# Patient Record
Sex: Male | Born: 1985
Health system: Southern US, Community
[De-identification: ages and names within clinical notes are randomized; demographics above are authoritative.]

## PROBLEM LIST (undated history)

## (undated) DIAGNOSIS — I471 Supraventricular tachycardia, unspecified: Secondary | ICD-10-CM

## (undated) DIAGNOSIS — N179 Acute kidney failure, unspecified: Secondary | ICD-10-CM

## (undated) DIAGNOSIS — E109 Type 1 diabetes mellitus without complications: Secondary | ICD-10-CM

## (undated) DIAGNOSIS — I639 Cerebral infarction, unspecified: Secondary | ICD-10-CM

## (undated) DIAGNOSIS — I10 Essential (primary) hypertension: Secondary | ICD-10-CM

## (undated) HISTORY — DX: Acute kidney failure, unspecified: N17.9

## (undated) HISTORY — DX: Cerebral infarction, unspecified: I63.9

## (undated) HISTORY — DX: Supraventricular tachycardia: I47.1

## (undated) HISTORY — DX: Essential (primary) hypertension: I10

## (undated) HISTORY — DX: Supraventricular tachycardia, unspecified: I47.10

---

## 2019-06-12 ENCOUNTER — Encounter (HOSPITAL_COMMUNITY): Payer: Self-pay

## 2019-06-12 ENCOUNTER — Inpatient Hospital Stay (HOSPITAL_COMMUNITY)
Admission: EM | Admit: 2019-06-12 | Discharge: 2019-06-20 | DRG: 064 | Disposition: A | Discharge status: Home / Self Care | Payer: Medicaid Other | Attending: Internal Medicine | Admitting: Internal Medicine

## 2019-06-12 ENCOUNTER — Emergency Department (HOSPITAL_COMMUNITY): Payer: Medicaid Other

## 2019-06-12 ENCOUNTER — Other Ambulatory Visit: Payer: Self-pay

## 2019-06-12 DIAGNOSIS — E785 Hyperlipidemia, unspecified: Secondary | ICD-10-CM | POA: Diagnosis present

## 2019-06-12 DIAGNOSIS — J9690 Respiratory failure, unspecified, unspecified whether with hypoxia or hypercapnia: Secondary | ICD-10-CM

## 2019-06-12 DIAGNOSIS — I160 Hypertensive urgency: Secondary | ICD-10-CM | POA: Diagnosis present

## 2019-06-12 DIAGNOSIS — E1010 Type 1 diabetes mellitus with ketoacidosis without coma: Secondary | ICD-10-CM | POA: Diagnosis not present

## 2019-06-12 DIAGNOSIS — D72829 Elevated white blood cell count, unspecified: Secondary | ICD-10-CM

## 2019-06-12 DIAGNOSIS — Z781 Physical restraint status: Secondary | ICD-10-CM

## 2019-06-12 DIAGNOSIS — I63432 Cerebral infarction due to embolism of left posterior cerebral artery: Principal | ICD-10-CM | POA: Diagnosis present

## 2019-06-12 DIAGNOSIS — E1049 Type 1 diabetes mellitus with other diabetic neurological complication: Secondary | ICD-10-CM | POA: Diagnosis not present

## 2019-06-12 DIAGNOSIS — N183 Chronic kidney disease, stage 3 unspecified: Secondary | ICD-10-CM

## 2019-06-12 DIAGNOSIS — R29713 NIHSS score 13: Secondary | ICD-10-CM | POA: Diagnosis present

## 2019-06-12 DIAGNOSIS — R402253 Coma scale, best verbal response, oriented, at hospital admission: Secondary | ICD-10-CM | POA: Diagnosis present

## 2019-06-12 DIAGNOSIS — G9341 Metabolic encephalopathy: Secondary | ICD-10-CM | POA: Diagnosis not present

## 2019-06-12 DIAGNOSIS — E1022 Type 1 diabetes mellitus with diabetic chronic kidney disease: Secondary | ICD-10-CM | POA: Diagnosis present

## 2019-06-12 DIAGNOSIS — I129 Hypertensive chronic kidney disease with stage 1 through stage 4 chronic kidney disease, or unspecified chronic kidney disease: Secondary | ICD-10-CM | POA: Diagnosis present

## 2019-06-12 DIAGNOSIS — R402143 Coma scale, eyes open, spontaneous, at hospital admission: Secondary | ICD-10-CM | POA: Diagnosis present

## 2019-06-12 DIAGNOSIS — R402363 Coma scale, best motor response, obeys commands, at hospital admission: Secondary | ICD-10-CM | POA: Diagnosis present

## 2019-06-12 DIAGNOSIS — I248 Other forms of acute ischemic heart disease: Secondary | ICD-10-CM | POA: Diagnosis present

## 2019-06-12 DIAGNOSIS — Z8673 Personal history of transient ischemic attack (TIA), and cerebral infarction without residual deficits: Secondary | ICD-10-CM | POA: Diagnosis present

## 2019-06-12 DIAGNOSIS — I471 Supraventricular tachycardia, unspecified: Secondary | ICD-10-CM

## 2019-06-12 DIAGNOSIS — R7982 Elevated C-reactive protein (CRP): Secondary | ICD-10-CM | POA: Diagnosis present

## 2019-06-12 DIAGNOSIS — Z794 Long term (current) use of insulin: Secondary | ICD-10-CM

## 2019-06-12 DIAGNOSIS — I639 Cerebral infarction, unspecified: Secondary | ICD-10-CM | POA: Diagnosis not present

## 2019-06-12 DIAGNOSIS — E10649 Type 1 diabetes mellitus with hypoglycemia without coma: Secondary | ICD-10-CM | POA: Diagnosis not present

## 2019-06-12 DIAGNOSIS — I10 Essential (primary) hypertension: Secondary | ICD-10-CM

## 2019-06-12 DIAGNOSIS — G9340 Encephalopathy, unspecified: Secondary | ICD-10-CM | POA: Diagnosis present

## 2019-06-12 DIAGNOSIS — Z1159 Encounter for screening for other viral diseases: Secondary | ICD-10-CM

## 2019-06-12 DIAGNOSIS — E109 Type 1 diabetes mellitus without complications: Secondary | ICD-10-CM

## 2019-06-12 DIAGNOSIS — N179 Acute kidney failure, unspecified: Secondary | ICD-10-CM

## 2019-06-12 DIAGNOSIS — R451 Restlessness and agitation: Secondary | ICD-10-CM

## 2019-06-12 DIAGNOSIS — R29818 Other symptoms and signs involving the nervous system: Secondary | ICD-10-CM | POA: Diagnosis present

## 2019-06-12 DIAGNOSIS — R4701 Aphasia: Secondary | ICD-10-CM | POA: Diagnosis present

## 2019-06-12 DIAGNOSIS — Z20828 Contact with and (suspected) exposure to other viral communicable diseases: Secondary | ICD-10-CM | POA: Diagnosis not present

## 2019-06-12 DIAGNOSIS — F1210 Cannabis abuse, uncomplicated: Secondary | ICD-10-CM | POA: Diagnosis present

## 2019-06-12 HISTORY — DX: Type 1 diabetes mellitus without complications: E10.9

## 2019-06-12 LAB — POCT I-STAT EG7
Acid-Base Excess: 3 mmol/L — ABNORMAL HIGH (ref 0.0–2.0)
Bicarbonate: 26.4 mmol/L (ref 20.0–28.0)
Calcium, Ion: 1.22 mmol/L (ref 1.15–1.40)
HCT: 51 % (ref 39.0–52.0)
Hemoglobin: 17.3 g/dL — ABNORMAL HIGH (ref 13.0–17.0)
O2 Saturation: 90 %
Potassium: 3.2 mmol/L — ABNORMAL LOW (ref 3.5–5.1)
Sodium: 135 mmol/L (ref 135–145)
TCO2: 27 mmol/L (ref 22–32)
pCO2, Ven: 35.6 mmHg — ABNORMAL LOW (ref 44.0–60.0)
pH, Ven: 7.477 — ABNORMAL HIGH (ref 7.250–7.430)
pO2, Ven: 54 mmHg — ABNORMAL HIGH (ref 32.0–45.0)

## 2019-06-12 LAB — CBG MONITORING, ED: Glucose-Capillary: 118 mg/dL — ABNORMAL HIGH (ref 70–99)

## 2019-06-12 LAB — CBC WITH DIFFERENTIAL/PLATELET
Abs Immature Granulocytes: 0.1 10*3/uL — ABNORMAL HIGH (ref 0.00–0.07)
Basophils Absolute: 0.1 10*3/uL (ref 0.0–0.1)
Basophils Relative: 0 %
Eosinophils Absolute: 0 10*3/uL (ref 0.0–0.5)
Eosinophils Relative: 0 %
HCT: 48.2 % (ref 39.0–52.0)
Hemoglobin: 17.1 g/dL — ABNORMAL HIGH (ref 13.0–17.0)
Immature Granulocytes: 1 %
Lymphocytes Relative: 7 %
Lymphs Abs: 1.6 10*3/uL (ref 0.7–4.0)
MCH: 30.5 pg (ref 26.0–34.0)
MCHC: 35.5 g/dL (ref 30.0–36.0)
MCV: 85.9 fL (ref 80.0–100.0)
Monocytes Absolute: 1 10*3/uL (ref 0.1–1.0)
Monocytes Relative: 5 %
Neutro Abs: 19.1 10*3/uL — ABNORMAL HIGH (ref 1.7–7.7)
Neutrophils Relative %: 87 %
Platelets: 335 10*3/uL (ref 150–400)
RBC: 5.61 MIL/uL (ref 4.22–5.81)
RDW: 12.4 % (ref 11.5–15.5)
WBC: 21.9 10*3/uL — ABNORMAL HIGH (ref 4.0–10.5)
nRBC: 0 % (ref 0.0–0.2)

## 2019-06-12 LAB — PROTIME-INR
INR: 0.9 (ref 0.8–1.2)
Prothrombin Time: 12.1 seconds (ref 11.4–15.2)

## 2019-06-12 LAB — LACTIC ACID, PLASMA: Lactic Acid, Venous: 2.5 mmol/L (ref 0.5–1.9)

## 2019-06-12 MED ORDER — SODIUM CHLORIDE 0.9 % IV BOLUS
1000.0000 mL | Freq: Once | INTRAVENOUS | Status: AC
  Administered 2019-06-13: 1000 mL via INTRAVENOUS

## 2019-06-12 MED ORDER — CLEVIDIPINE BUTYRATE 0.5 MG/ML IV EMUL
0.0000 mg/h | INTRAVENOUS | Status: DC
  Administered 2019-06-12: 20 mg/h via INTRAVENOUS
  Administered 2019-06-12: 2 mg/h via INTRAVENOUS
  Filled 2019-06-12 (×2): qty 50

## 2019-06-12 MED ORDER — SODIUM CHLORIDE 0.9 % IV BOLUS (SEPSIS)
1000.0000 mL | Freq: Once | INTRAVENOUS | Status: AC
  Administered 2019-06-12: 1000 mL via INTRAVENOUS

## 2019-06-12 MED ORDER — LORAZEPAM 2 MG/ML IJ SOLN
1.0000 mg | INTRAMUSCULAR | Status: DC | PRN
  Filled 2019-06-12: qty 1

## 2019-06-12 MED ORDER — SODIUM CHLORIDE 0.9 % IV SOLN
1000.0000 mL | INTRAVENOUS | Status: DC
  Administered 2019-06-12 – 2019-06-13 (×3): 1000 mL via INTRAVENOUS

## 2019-06-12 MED ORDER — LORAZEPAM 2 MG/ML IJ SOLN
2.0000 mg | Freq: Once | INTRAMUSCULAR | Status: AC
  Administered 2019-06-12: 2 mg via INTRAVENOUS

## 2019-06-12 MED ORDER — SODIUM CHLORIDE 0.9 % IV SOLN
Freq: Once | INTRAVENOUS | Status: AC
  Administered 2019-06-12: 23:00:00 via INTRAVENOUS

## 2019-06-12 MED ORDER — FENTANYL CITRATE (PF) 100 MCG/2ML IJ SOLN
12.5000 ug | Freq: Once | INTRAMUSCULAR | Status: AC
  Administered 2019-06-12: 12.5 ug via INTRAVENOUS

## 2019-06-12 MED ORDER — LORAZEPAM 2 MG/ML IJ SOLN
INTRAMUSCULAR | Status: AC
  Filled 2019-06-12: qty 1

## 2019-06-12 MED ORDER — FENTANYL CITRATE (PF) 100 MCG/2ML IJ SOLN
INTRAMUSCULAR | Status: AC
  Filled 2019-06-12: qty 2

## 2019-06-12 MED ORDER — LORAZEPAM 2 MG/ML IJ SOLN
1.0000 mg | Freq: Once | INTRAMUSCULAR | Status: AC
  Administered 2019-06-12: 1 mg via INTRAVENOUS
  Filled 2019-06-12: qty 1

## 2019-06-12 NOTE — ED Notes (Signed)
CCM at bedside 

## 2019-06-12 NOTE — ED Provider Notes (Signed)
[facility] EMERGENCY DEPARTMENT Provider Note   CSN: [id number] Arrival date & time: 06/12/19  2119    History   Chief Complaint Chief Complaint  Patient presents with   Aphasia    HPI George [name] is a 33 y.o. male.     HPI Reportedly patient has not been seeing a physician for management of his type 1 diabetes.  He apparently got tired of doing that and so for several years has been living with his parents.  He has been getting NovoLog from the pharmacy without a prescription and managing his diabetes based on self check of blood sugars.  Reportedly the patient was at baseline on Friday, he ate dinner with family members and his mother reports she last saw him at 8:30 PM.  He went to his room and she did not see him again until Saturday morning..  On Saturday morning he complained of a severe splitting headache.  That was at approximately 9 AM.  At that time she could tell that he was not feeling good.  She reports he did seem to want to use the wall to guide him as he went down the hallway.  Reportedly he was somewhat agitated and not quite himself but not abnormal enough to indicate he needed evaluation.  Patient's mother noted that when he did his blood sugar check in the evening he seemed to have difficulty getting it on the strip and she had to help him which is not typical.  He could not use the right words to name the parts of his glucometer.  At noon today he had a dramatic change in his speech patterns.  He started having nonsense and repetitive speech.  At that point EMS was contacted.  When he arrived to the emergency department, patient is denying he has any headache or pain.  He is alert but speech is repetitive and tangential without be able to answer questions in normal cognitive fashion.  Per the patient's mother there is no known significant history of drug abuse.  No known history previously of hypertension. Past Medical History:  Diagnosis Date    Type 1 diabetes ([place])     There are no active problems to display for this patient.   History reviewed. No pertinent surgical history.      Home Medications    Prior to Admission medications   Not on File    Family History History reviewed. No pertinent family history.  Social History Social History   Tobacco Use   Smoking status: Never Smoker   Smokeless tobacco: Never Used  Substance Use Topics   Alcohol use: Yes    Comment: 6 beers/week   Drug use: Not Currently     Allergies   Patient has no known allergies.   Review of Systems Review of Systems Level 5 caveat cannot obtain review of systems due to patient condition.  Physical Exam Updated Vital Signs BP (!) 146/101    Pulse (!) 147    Temp 98.6 F (37 C) (Oral)    Resp 20    Ht 5\' 5"  (1.651 m)    Wt 56.7 kg    SpO2 99%    BMI 20.80 kg/m   Physical Exam Constitutional:      Comments: Patient is alert.  He is sitting up in the stretcher.  He does not appear somnolent.  He is very talkative but speech is not making much sense.  Words individually are clear.  No respiratory distress.  Patient does not smell of ketones.  HENT:     Head: Normocephalic and atraumatic.     Nose: Nose normal.     Mouth/Throat:     Mouth: Mucous membranes are moist.     Pharynx: Oropharynx is clear.  Eyes:     Extraocular Movements: Extraocular movements intact.     Pupils: Pupils are equal, round, and reactive to light.     Comments: Patient can follow my fingers past midline on both sides.  He will look directly at me while speaking.  He will follow me from 1 side of the stretcher to the other with his gaze.  Neck:     Musculoskeletal: Neck supple.  Cardiovascular:     Comments: Borderline tachycardia.  No gross rub murmur gallop. Pulmonary:     Effort: Pulmonary effort is normal.     Breath sounds: Normal breath sounds.  Abdominal:     General: There is no distension.     Palpations: Abdomen is soft.      Tenderness: There is no abdominal tenderness. There is no guarding.  Musculoskeletal: Normal range of motion.        General: No swelling or tenderness.     Right lower leg: No edema.     Left lower leg: No edema.  Skin:    General: Skin is warm and dry.  Neurological:     Comments: Ental status is alert and wide-awake.  Patient cannot appropriately follow commands for finger-nose exam.  He will first reach out for my hand and continue to reach forward and try to grasp my finger.  He does not understand the command to touch his own nose.  Can do this on both sides.  His use of the upper extremities is symmetric.  He has intact motor strength.  He is having difficulty understanding the commands.  He will either keep his arms very flexed and asked to extend or move them out when asked to do other activities.  He exhibits the effort in the will to follow commands but is somewhat random in his ability to execute.  5\5 motor strength of both lower extremities.  Patient cannot name objects.  He gives some tangential descriptions.  He recognized my work phone and states "that is an old one" he cannot generate the word phone but goes on to describe that it is for persons to persons.  He also cannot name my pen.  Psychiatric:     Comments: Mood seems good.  Patient is cheerful      ED Treatments / Results  Labs (all labs ordered are listed, but only abnormal results are displayed) Labs Reviewed  COMPREHENSIVE METABOLIC PANEL - Abnormal; Notable for the following components:      Result Value   Sodium 134 (*)    Potassium 3.2 (*)    Glucose, Bld 132 (*)    Creatinine, Ser 2.23 (*)    Alkaline Phosphatase 128 (*)    GFR calc non Af Amer 38 (*)    GFR calc Af Amer 44 (*)    All other components within normal limits  LACTIC ACID, PLASMA - Abnormal; Notable for the following components:   Lactic Acid, Venous 2.5 (*)    All other components within normal limits  CBC WITH DIFFERENTIAL/PLATELET -  Abnormal; Notable for the following components:   WBC 21.9 (*)    Hemoglobin 17.1 (*)    Neutro Abs 19.1 (*)    Abs Immature Granulocytes 0.10 (*)  All other components within normal limits  PHOSPHORUS - Abnormal; Notable for the following components:   Phosphorus 2.4 (*)    All other components within normal limits  CBG MONITORING, ED - Abnormal; Notable for the following components:   Glucose-Capillary 118 (*)    All other components within normal limits  POCT I-STAT EG7 - Abnormal; Notable for the following components:   pH, Ven 7.477 (*)    pCO2, Ven 35.6 (*)    pO2, Ven 54.0 (*)    Acid-Base Excess 3.0 (*)    Potassium 3.2 (*)    Hemoglobin 17.3 (*)    All other components within normal limits  NOVEL CORONAVIRUS, NAA (HOSPITAL ORDER, SEND-OUT TO REF LAB)  PROTIME-INR  MAGNESIUM  ACETAMINOPHEN LEVEL  ETHANOL  LACTIC ACID, PLASMA  URINALYSIS, ROUTINE W REFLEX MICROSCOPIC  RAPID URINE DRUG SCREEN, HOSP PERFORMED  BLOOD GAS, VENOUS  TSH    EKG EKG Interpretation  Date/Time:  Sunday June 12 2019 23:30:31 EDT Ventricular Rate:  151 PR Interval:    QRS Duration: 98 QT Interval:  356 QTC Calculation: 565 R Axis:   -174 Text Interpretation:  Sinus tachycardia Right axis deviation agree. no old comp Confirmed by Charlesetta Shanks [id number]) on 06/13/2019 12:02:10 AM   Radiology Ct Head Wo Contrast  Result Date: 06/12/2019 CLINICAL DATA:  33 y/o M; altered level of consciousness. Expressive aphasia, type 1 diabetes. EXAM: CT HEAD WITHOUT CONTRAST TECHNIQUE: Contiguous axial images were obtained from the base of the skull through the vertex without intravenous contrast. COMPARISON:  None. FINDINGS: Brain: Hypoattenuation with loss of gray-white differentiation involving left occipital lobe and left medial temporal lobe. No hemorrhage, extra-axial collection, hydrocephalus, or herniation. Vascular: No hyperdense vessel or unexpected calcification. Skull: Normal. Negative for  fracture or focal lesion. Sinuses/Orbits: No acute finding. Other: None. IMPRESSION: Large area of hypoattenuation in the left PCA distribution probably representing a late acute to early subacute infarction. No hemorrhage or gross mass effect. Differential of underlying neoplasm or cerebritis is less likely given typical vascular distribution. These results were called by telephone at the time of interpretation on 06/12/2019 at 10:08 pm to Dr. Charlesetta Shanks , who verbally acknowledged these results. Electronically Signed   By: Kristine Garbe M.D.   On: 06/12/2019 22:13    Procedures Procedures (including critical care time) CRITICAL CARE Performed by: Charlesetta Shanks   Total critical care time: 45  minutes  Critical care time was exclusive of separately billable procedures and treating other patients.  Critical care was necessary to treat or prevent imminent or life-threatening deterioration.  Critical care was time spent personally by me on the following activities: development of treatment plan with patient and/or surrogate as well as nursing, discussions with consultants, evaluation of patient's response to treatment, examination of patient, obtaining history from patient or surrogate, ordering and performing treatments and interventions, ordering and review of laboratory studies, ordering and review of radiographic studies, pulse oximetry and re-evaluation of patient's condition. Medications Ordered in ED Medications  clevidipine (CLEVIPREX) infusion 0.5 mg/mL (18 mg/hr Intravenous Rate/Dose Change 06/12/19 2359)  sodium chloride 0.9 % bolus 1,000 mL (1,000 mLs Intravenous New Bag/Given 06/12/19 2347)    Followed by  0.9 %  sodium chloride infusion (1,000 mLs Intravenous New Bag/Given 06/12/19 2347)  LORazepam (ATIVAN) injection 1 mg (has no administration in time range)  sodium chloride 0.9 % bolus 1,000 mL (has no administration in time range)  LORazepam (ATIVAN) injection 1 mg  (1 mg Intravenous Given 06/12/19  2233)  fentaNYL (SUBLIMAZE) injection 12.5 mcg (12.5 mcg Intravenous Given 06/12/19 2237)  0.9 %  sodium chloride infusion ( Intravenous Stopped 06/12/19 2349)  LORazepam (ATIVAN) injection 2 mg (2 mg Intravenous Given 06/12/19 2337)     Initial Impression / Assessment and Plan / ED Course  I have reviewed the triage vital signs and the nursing notes.  Pertinent labs & imaging results that were available during my care of the patient were reviewed by me and considered in my medical decision making (see chart for details).  Clinical Course as of Jun 12 2  Sun Jun 12, 2019  2206 Consult: Dr. Cheral Marker has been consulted after first evaluation of the patient.  He will come to emergency department for evaluation.  Since then CT scan has not resulted but I have reviewed and there is a large shadowing mass on the left.   [MP]  2255 Consult :reviewed with [facility]ink for admission for cleviprex drip.   [MP]    Clinical Course User Index [MP] Charlesetta Shanks, MD       Patient presents with significant speech anomaly.  Last known well was 8:30 PM Friday (2 days ago).  CT shows large infarct territory.  Patient does have comorbid illness of type 1 diabetes.  He has been managing that at home without specific medical oversight.  Neurology consulted.  Cleviprex initiated for blood pressure control.  Patient is requiring Ativan for sedation.  He has been somewhat agitated.  He is very alert.  No airway compromise.  Patient will be admitted to intensivist service for management of blood pressure with consultation to neurology.  Final Clinical Impressions(s) / ED Diagnoses   Final diagnoses:  Cerebral infarction, unspecified mechanism ([place])  Type 1 diabetes mellitus with other neurologic complication [facility])    ED Discharge Orders    None       Charlesetta Shanks, MD 06/13/19 0006

## 2019-06-12 NOTE — H&P (Signed)
PULMONARY / CRITICAL CARE MEDICINE   Name: George [name] MRN: [id number] DOB: 1986/09/02    ADMISSION DATE:  06/12/2019 CONSULTATION DATE:  06/12/2019  REFERRING MD:  Charlesetta Shanks, MD  CHIEF COMPLAINT:  headache  HISTORY OF PRESENT ILLNESS:   33 year old male history of type 1 diabetes who presented to the emergency department with complaints of speech difficulty, "holding on to the wall" at home and headache. Last known normal was around 8 pm Friday night, then awoke Saturday morning with above symptoms.    He was seen by neurology who noted receptive aphasia, not oriented, difficulty comprehending commands.  She was out of the window for TPA and was admitted to critical care medicine for intensive blood pressure control and stroke monitoring.  When I arrived he was initially calm, able to cooperate fully with neurological exam as below, however he shortly thereafter became very agitated. He had already received multiple doses of ativan and he was subsequently given haldol 5 followed by 10 mg before becoming calm.   I spoke with his mother who stated he complained of minor abdominal pain, but otherwise no fevers, chills, sore throat, myalgias, nausea, vomiting, diarrhea, shortness of breath.  She states that he has a chronic "smoker's cough" that is unchanged from his baseline.   PAST MEDICAL HISTORY :  He  has a past medical history of Type 1 diabetes ([place]).  PAST SURGICAL HISTORY: He  has no past surgical history on file.  No Known Allergies  No current facility-administered medications on file prior to encounter.    No current outpatient medications on file prior to encounter.    FAMILY HISTORY:  His has no family status information on file.    SOCIAL HISTORY: He  reports that he has never smoked. He has never used smokeless tobacco. He reports current alcohol use. He reports previous drug use.  REVIEW OF SYSTEMS:   Unable to obtain   VITAL SIGNS: BP (!)  146/101   Pulse (!) 147   Temp 98.6 F (37 C) (Oral)   Resp 20   Ht 5\' 5"  (1.651 m)   Wt 56.7 kg   SpO2 99%   BMI 20.80 kg/m   HEMODYNAMICS:    VENTILATOR SETTINGS:    INTAKE / OUTPUT: No intake/output data recorded.  PHYSICAL EXAMINATION: Physical Exam Constitutional:      Comments: Initially calm however later became extremely agitated, requiring 10 team members to restrain  HENT:     Head: Normocephalic and atraumatic.  Eyes:     General: No scleral icterus.       Right eye: No discharge.        Left eye: No discharge.  Neck:     Vascular: No JVD.  Cardiovascular:     Rate and Rhythm: Regular rhythm. Tachycardia present.     Heart sounds: No murmur.  Pulmonary:     Effort: Pulmonary effort is normal. No respiratory distress.     Breath sounds: Normal breath sounds. No stridor.  Abdominal:     General: There is no distension.     Palpations: Abdomen is soft.     Tenderness: There is no abdominal tenderness.  Musculoskeletal:        General: No deformity.  Skin:    General: Skin is warm and dry.  Neurological:     Mental Status: He is alert.     GCS: GCS eye subscore is 4. GCS verbal subscore is 5. GCS motor subscore is 6.  Cranial Nerves: No dysarthria.     Motor: Motor function is intact.     Coordination: Heel to [facility] Test abnormal (unable to follow commands).     Comments: Speech fluent but word salad, unable to repeat object names or simple phrases. Follows some commands but not multiple step commands such as heel to shin. No oriented     LABS:  BMET Recent Labs  Lab 06/12/19 2305 06/12/19 2316  NA 134* 135  K 3.2* 3.2*  CL 98  --   CO2 23  --   BUN 14  --   CREATININE 2.23*  --   GLUCOSE 132*  --     Electrolytes Recent Labs  Lab 06/12/19 2305  CALCIUM 9.6  MG 1.9  PHOS 2.4*    CBC Recent Labs  Lab 06/12/19 2305 06/12/19 2316  WBC 21.9*  --   HGB 17.1* 17.3*  HCT 48.2 51.0  PLT 335  --     Coag's Recent Labs  Lab  06/12/19 2305  INR 0.9    Sepsis Markers Recent Labs  Lab 06/12/19 2305  LATICACIDVEN 2.5*    ABG No results for input(s): PHART, PCO2ART, PO2ART in the last 168 hours.  Liver Enzymes Recent Labs  Lab 06/12/19 2305  AST 22  ALT 16  ALKPHOS 128*  BILITOT 0.4  ALBUMIN 3.7    Cardiac Enzymes No results for input(s): TROPONINI, PROBNP in the last 168 hours.  Glucose Recent Labs  Lab 06/12/19 2344  GLUCAP 118*    Imaging Ct Head Wo Contrast  Result Date: 06/12/2019 CLINICAL DATA:  33 y/o M; altered level of consciousness. Expressive aphasia, type 1 diabetes. EXAM: CT HEAD WITHOUT CONTRAST TECHNIQUE: Contiguous axial images were obtained from the base of the skull through the vertex without intravenous contrast. COMPARISON:  None. FINDINGS: Brain: Hypoattenuation with loss of gray-white differentiation involving left occipital lobe and left medial temporal lobe. No hemorrhage, extra-axial collection, hydrocephalus, or herniation. Vascular: No hyperdense vessel or unexpected calcification. Skull: Normal. Negative for fracture or focal lesion. Sinuses/Orbits: No acute finding. Other: None. IMPRESSION: Large area of hypoattenuation in the left PCA distribution probably representing a late acute to early subacute infarction. No hemorrhage or gross mass effect. Differential of underlying neoplasm or cerebritis is less likely given typical vascular distribution. These results were called by telephone at the time of interpretation on 06/12/2019 at 10:08 pm to Dr. Charlesetta Shanks , who verbally acknowledged these results. Electronically Signed   By: Kristine Garbe M.D.   On: 06/12/2019 22:13     STUDIES:  [facility] 6/14 completed left occipital and posterior temporal lobe ischemic infarction  CULTURES: none  ANTIBIOTICS: none  SIGNIFICANT EVENTS: 6/14 p/w aphasia, confusion, agitation  LINES/TUBES: none  DISCUSSION: Family updated by ED provider 6/14  ASSESSMENT /  PLAN: 33 year old male history of type 1 diabetes who presented to the emergency department with complaints of speech difficulty, "stumbling into things" at home and headache.  Found to have a completed left occipital and posterior temporal lobe ischemic infarction.  PULMONARY A: No active issues   CARDIOVASCULAR A:  tachycardia  P:  - extremely tachycardic even when calm. Receiving IVF now.  - Check TSH, echo. No symptoms to suspect sepsis. -likely just due to severe agitation with not being able to communicate so will reassess once adequately sedated and consider start beta blocker if still tachycardic    RENAL A:   Acute vs chronic kidney injury  P:   -No baseline records  to compare to.  Can discuss with him once he is more communicative or with his mother in the morning to establish baseline.  Follow-up renal indices after receiving IV fluids in emergency department, consider renal ultrasound if not trending towards normal - start lisinopril, titrate as needed  GASTROINTESTINAL A:   Suspected dysphagia  P:   - speech/swallow as below - NPO until then  HEMATOLOGIC A:  No active issues   INFECTIOUS A:   leukocytosis  P:   - suspect reactive, however with tachycardia monitor for any possible sources of infection  ENDOCRINE A:   Diabetes mellitus   P:   - his mother does not know his insulin regimen and patient confused.  Will start on sliding scale and adjust as needed  NEUROLOGIC A:   Completed left occipital and temporal lobe stroke Extreme agitation P:   RASS goal: 0 -Has required multiple doses of Ativan and haloperidol in the emergency department.  Will start Precedex.  Presumably agitation is due to his confusion and inability to communicate with stroke however he appears even more agitated now expect that this and therefore will check a urine drug screen though his mother denies any substances of abuse per the emergency department - echo - CTA head  and neck - stroke labs - discussed with Neurology Dr Cheral Marker, as he is now almost 48 hours from first symptoms will control BP ~ 25% of max here with a goal SBP 140-160 and lower by 15% each subsequent day - speech/occupational/physical therapy - Aspirin 325 mg   FAMILY  - Updates: mother updated 6/15 AM  - Inter-disciplinary family meet or Palliative Care meeting due by:  day 7   Mali Keyondra Lagrand, MD Pulmonary and [place] Pager: [phone]  06/13/2019, 12:48 AM

## 2019-06-12 NOTE — ED Triage Notes (Signed)
PT BIB [facility] for eval of expressive aphasia onset this afternoon at 1200. EMS reports pt w/ difficulty "completing normal tasks" yesterday, w/ some agitation and HA. States onset of aphasia today. Pt w/ receptive aphasia on arrival, EMS reports only noted expressive aphasia.

## 2019-06-12 NOTE — ED Notes (Signed)
MD Pfeiffer at bedside, per MD NO CODE STROKE AT THIS TIME. Call neurology and will advise from there.

## 2019-06-12 NOTE — Consult Note (Signed)
Referring Physician: Dr. Johnney Killian    Chief Complaint: Aphasia  HPI: George [name] is an 33 y.o. male with a history of type I diabetes, presenting to the ED via EMS for aphasia. EMS reported patient with difficulty "completing normal tasks" yesterday in conjunction with agitation and HA. Mother states the headache was a "splitting headache". The patient was noted to have a receptive aphasia with word-salad responses to questions on arrival to the ED. Mother states he was also "stumbling into things" at home. Onset of the headache was Saturday morning. Last true normal was 8:30 PM on Friday. He got up at 9 AM on Saturday, apparently feeling unwell, had to touch the wall when walking to the bathroom. By about 4 PM Saturday he began manifesting language deficit, using incorrect words when trying to describe what he perceived as trouble with his glucometer, which he needed family help to use.  Today headache was improved, but he again had difficulty using his glucometer and speech deficit had worsened by the early afternoon.   LSN: Friday at 8:30 PM tPA Given: No: Out of time window  Past Medical History:  Diagnosis Date  . Type 1 diabetes ([place])     History reviewed. No pertinent surgical history.  History reviewed. No pertinent family history. Social History:  reports that he has never smoked. He has never used smokeless tobacco. He reports current alcohol use. He reports previous drug use.  Allergies: No Known Allergies  Home Medications:  Novolog  ROS: Unable to obtain due to aphasia.   Physical Examination: Height 5\' 5"  (1.651 m), weight 56.7 kg, SpO2 100 %.  HEENT: [place]/AT Lungs: Respirations unlabored. No gross wheezing or rhonchi noted.  Ext: No edema  Neurologic Examination: Mental Status: Awake and alert. Dense receptive aphasia with fluent word-salad responses to all questions and severely impaired language comprehension. No dysarthria. Unable to answer orientation questions  or questions regarding his symptoms or history. Does not appear to be in any pain. Will follow some pantomimed commands with frequent errors and difficulty.  Cranial Nerves: II:  Tracks examiner and fixates normally. Does not consistently blink to threat in either the left or right visual field. PERRL.   III,IV, VI: EOMI with saccadic quality of pursuits noted. No nystagmus. No ptosis.  V,VII: No facial droop. Reacts equally briskly to cool metal applied to right and left sides of face.  VIII: hearing intact to voice IX,X: No hoarseness or hypophonia XI: Head at midline XII: Tongue is midline.   Motor: Right : Upper extremity   5/5    Left:     Upper extremity   5/5  Lower extremity   5/5     Lower extremity   5/5 No pronator drift.  Sensory:  Reacts equally briskly to cool metal applied to right and left sides  Deep Tendon Reflexes:  2+ bilateral upper and lower extremities. Cerebellar: No ataxia with movement on motor exam. Cannot comprehend commands to perform FNF or H-S.  Gait: Deferred   CT head:  Large area of hypoattenuation in the left PCA distribution probably representing a late acute to early subacute infarction. No hemorrhage or gross mass effect. Differential of underlying neoplasm or cerebritis is less likely given typical vascular distribution.  Assessment: 33 y.o. male presenting with receptive aphasia 1. Exam best localizes to the left perisylvian cortices.  2. CT head reveals a completed left occipital and posterior temporal lobe ischemic infarction 3. Stroke Risk Factors - Diabetes (DM type 1) 4. Out  of tPA time window  Plan: 1. HgbA1c, fasting lipid panel 2. MRI of the brain without contrast 3. PT consult, OT consult, Speech consult 4. Echocardiogram 5. CTA of head and neck to evaluate for possible dissection or vasculitis 6. Prophylactic therapy- start ASA 325 mg po qd 7. Risk factor modification 8. Telemetry monitoring 9. Frequent neuro checks 10. BP  control with clevidipine gtt 11. Management of DM1 per primary team   @Electronically  signed: Dr. Kerney Elbe  06/12/2019, 9:45 PM

## 2019-06-12 NOTE — ED Notes (Signed)
MD Pfeiffer at bedside ° °

## 2019-06-12 NOTE — ED Notes (Signed)
MD aware of bp 198/118 on cleviprex

## 2019-06-12 NOTE — ED Notes (Signed)
Pt restless, pulling at monitoring wires. Remains tachycardic, rate 150-170

## 2019-06-13 ENCOUNTER — Inpatient Hospital Stay (HOSPITAL_COMMUNITY): Payer: Medicaid Other

## 2019-06-13 DIAGNOSIS — I63532 Cerebral infarction due to unspecified occlusion or stenosis of left posterior cerebral artery: Secondary | ICD-10-CM | POA: Diagnosis not present

## 2019-06-13 DIAGNOSIS — E785 Hyperlipidemia, unspecified: Secondary | ICD-10-CM | POA: Diagnosis present

## 2019-06-13 DIAGNOSIS — N179 Acute kidney failure, unspecified: Secondary | ICD-10-CM

## 2019-06-13 DIAGNOSIS — I129 Hypertensive chronic kidney disease with stage 1 through stage 4 chronic kidney disease, or unspecified chronic kidney disease: Secondary | ICD-10-CM | POA: Diagnosis present

## 2019-06-13 DIAGNOSIS — I081 Rheumatic disorders of both mitral and tricuspid valves: Secondary | ICD-10-CM | POA: Diagnosis not present

## 2019-06-13 DIAGNOSIS — R4701 Aphasia: Secondary | ICD-10-CM | POA: Diagnosis not present

## 2019-06-13 DIAGNOSIS — I471 Supraventricular tachycardia: Secondary | ICD-10-CM | POA: Diagnosis not present

## 2019-06-13 DIAGNOSIS — E1049 Type 1 diabetes mellitus with other diabetic neurological complication: Secondary | ICD-10-CM

## 2019-06-13 DIAGNOSIS — I639 Cerebral infarction, unspecified: Secondary | ICD-10-CM | POA: Diagnosis not present

## 2019-06-13 DIAGNOSIS — R451 Restlessness and agitation: Secondary | ICD-10-CM

## 2019-06-13 DIAGNOSIS — I10 Essential (primary) hypertension: Secondary | ICD-10-CM | POA: Diagnosis not present

## 2019-06-13 DIAGNOSIS — D72829 Elevated white blood cell count, unspecified: Secondary | ICD-10-CM | POA: Diagnosis not present

## 2019-06-13 DIAGNOSIS — R29818 Other symptoms and signs involving the nervous system: Secondary | ICD-10-CM

## 2019-06-13 DIAGNOSIS — N183 Chronic kidney disease, stage 3 unspecified: Secondary | ICD-10-CM

## 2019-06-13 DIAGNOSIS — F1210 Cannabis abuse, uncomplicated: Secondary | ICD-10-CM | POA: Diagnosis present

## 2019-06-13 DIAGNOSIS — I160 Hypertensive urgency: Secondary | ICD-10-CM | POA: Diagnosis present

## 2019-06-13 DIAGNOSIS — Z794 Long term (current) use of insulin: Secondary | ICD-10-CM | POA: Diagnosis not present

## 2019-06-13 DIAGNOSIS — E109 Type 1 diabetes mellitus without complications: Secondary | ICD-10-CM | POA: Diagnosis not present

## 2019-06-13 DIAGNOSIS — Z1159 Encounter for screening for other viral diseases: Secondary | ICD-10-CM | POA: Diagnosis not present

## 2019-06-13 DIAGNOSIS — G9340 Encephalopathy, unspecified: Secondary | ICD-10-CM | POA: Diagnosis not present

## 2019-06-13 DIAGNOSIS — E10649 Type 1 diabetes mellitus with hypoglycemia without coma: Secondary | ICD-10-CM | POA: Diagnosis not present

## 2019-06-13 DIAGNOSIS — I63432 Cerebral infarction due to embolism of left posterior cerebral artery: Secondary | ICD-10-CM | POA: Diagnosis not present

## 2019-06-13 DIAGNOSIS — R402363 Coma scale, best motor response, obeys commands, at hospital admission: Secondary | ICD-10-CM | POA: Diagnosis present

## 2019-06-13 DIAGNOSIS — E1022 Type 1 diabetes mellitus with diabetic chronic kidney disease: Secondary | ICD-10-CM | POA: Diagnosis present

## 2019-06-13 DIAGNOSIS — J9600 Acute respiratory failure, unspecified whether with hypoxia or hypercapnia: Secondary | ICD-10-CM | POA: Diagnosis not present

## 2019-06-13 DIAGNOSIS — G9341 Metabolic encephalopathy: Secondary | ICD-10-CM | POA: Diagnosis not present

## 2019-06-13 DIAGNOSIS — I248 Other forms of acute ischemic heart disease: Secondary | ICD-10-CM | POA: Diagnosis not present

## 2019-06-13 DIAGNOSIS — Z20828 Contact with and (suspected) exposure to other viral communicable diseases: Secondary | ICD-10-CM | POA: Diagnosis not present

## 2019-06-13 DIAGNOSIS — Z8673 Personal history of transient ischemic attack (TIA), and cerebral infarction without residual deficits: Secondary | ICD-10-CM | POA: Diagnosis present

## 2019-06-13 DIAGNOSIS — R402143 Coma scale, eyes open, spontaneous, at hospital admission: Secondary | ICD-10-CM | POA: Diagnosis present

## 2019-06-13 DIAGNOSIS — R402253 Coma scale, best verbal response, oriented, at hospital admission: Secondary | ICD-10-CM | POA: Diagnosis present

## 2019-06-13 DIAGNOSIS — R29713 NIHSS score 13: Secondary | ICD-10-CM | POA: Diagnosis present

## 2019-06-13 DIAGNOSIS — E1010 Type 1 diabetes mellitus with ketoacidosis without coma: Secondary | ICD-10-CM | POA: Diagnosis not present

## 2019-06-13 HISTORY — DX: Cerebral infarction, unspecified: I63.9

## 2019-06-13 HISTORY — DX: Other symptoms and signs involving the nervous system: R29.818

## 2019-06-13 LAB — RAPID URINE DRUG SCREEN, HOSP PERFORMED
Amphetamines: NOT DETECTED
Barbiturates: NOT DETECTED
Benzodiazepines: NOT DETECTED
Cocaine: NOT DETECTED
Opiates: NOT DETECTED
Tetrahydrocannabinol: POSITIVE — AB

## 2019-06-13 LAB — CBC
HCT: 39.8 % (ref 39.0–52.0)
Hemoglobin: 14.2 g/dL (ref 13.0–17.0)
MCH: 31 pg (ref 26.0–34.0)
MCHC: 35.7 g/dL (ref 30.0–36.0)
MCV: 86.9 fL (ref 80.0–100.0)
Platelets: 274 10*3/uL (ref 150–400)
RBC: 4.58 MIL/uL (ref 4.22–5.81)
RDW: 12.6 % (ref 11.5–15.5)
WBC: 35.6 10*3/uL — ABNORMAL HIGH (ref 4.0–10.5)
nRBC: 0 % (ref 0.0–0.2)

## 2019-06-13 LAB — GLUCOSE, CAPILLARY
Glucose-Capillary: 145 mg/dL — ABNORMAL HIGH (ref 70–99)
Glucose-Capillary: 162 mg/dL — ABNORMAL HIGH (ref 70–99)
Glucose-Capillary: 164 mg/dL — ABNORMAL HIGH (ref 70–99)
Glucose-Capillary: 335 mg/dL — ABNORMAL HIGH (ref 70–99)
Glucose-Capillary: 44 mg/dL — CL (ref 70–99)
Glucose-Capillary: 52 mg/dL — ABNORMAL LOW (ref 70–99)
Glucose-Capillary: 54 mg/dL — ABNORMAL LOW (ref 70–99)
Glucose-Capillary: 67 mg/dL — ABNORMAL LOW (ref 70–99)
Glucose-Capillary: 73 mg/dL (ref 70–99)
Glucose-Capillary: 80 mg/dL (ref 70–99)

## 2019-06-13 LAB — HEMOGLOBIN A1C
Hgb A1c MFr Bld: 8.4 % — ABNORMAL HIGH (ref 4.8–5.6)
Mean Plasma Glucose: 194.38 mg/dL

## 2019-06-13 LAB — TSH: TSH: 1.476 u[IU]/mL (ref 0.350–4.500)

## 2019-06-13 LAB — CBG MONITORING, ED: Glucose-Capillary: 210 mg/dL — ABNORMAL HIGH (ref 70–99)

## 2019-06-13 LAB — MRSA PCR SCREENING: MRSA by PCR: NEGATIVE

## 2019-06-13 LAB — LIPID PANEL
Cholesterol: 215 mg/dL — ABNORMAL HIGH (ref 0–200)
HDL: 53 mg/dL (ref >40)
LDL Cholesterol: 102 mg/dL — ABNORMAL HIGH (ref 0–99)
Total CHOL/HDL Ratio: 4.1 RATIO
Triglycerides: 298 mg/dL — ABNORMAL HIGH (ref <150)
VLDL: 60 mg/dL — ABNORMAL HIGH (ref 0–40)

## 2019-06-13 LAB — URINALYSIS, ROUTINE W REFLEX MICROSCOPIC
Bilirubin Urine: NEGATIVE
Glucose, UA: 150 mg/dL — AB
Ketones, ur: NEGATIVE mg/dL
Leukocytes,Ua: NEGATIVE
Nitrite: NEGATIVE
Protein, ur: >=300 mg/dL — AB
Specific Gravity, Urine: 1.008 (ref 1.005–1.030)
pH: 6 (ref 5.0–8.0)

## 2019-06-13 LAB — BASIC METABOLIC PANEL
Anion gap: 17 — ABNORMAL HIGH (ref 5–15)
BUN: 15 mg/dL (ref 6–20)
CO2: 16 mmol/L — ABNORMAL LOW (ref 22–32)
Calcium: 8.5 mg/dL — ABNORMAL LOW (ref 8.9–10.3)
Chloride: 101 mmol/L (ref 98–111)
Creatinine, Ser: 2.54 mg/dL — ABNORMAL HIGH (ref 0.61–1.24)
GFR calc Af Amer: 37 mL/min — ABNORMAL LOW (ref >60)
GFR calc non Af Amer: 32 mL/min — ABNORMAL LOW (ref >60)
Glucose, Bld: 341 mg/dL — ABNORMAL HIGH (ref 70–99)
Potassium: 3.8 mmol/L (ref 3.5–5.1)
Sodium: 134 mmol/L — ABNORMAL LOW (ref 135–145)

## 2019-06-13 LAB — COMPREHENSIVE METABOLIC PANEL
ALT: 16 U/L (ref 0–44)
AST: 22 U/L (ref 15–41)
Albumin: 3.7 g/dL (ref 3.5–5.0)
Alkaline Phosphatase: 128 U/L — ABNORMAL HIGH (ref 38–126)
Anion gap: 13 (ref 5–15)
BUN: 14 mg/dL (ref 6–20)
CO2: 23 mmol/L (ref 22–32)
Calcium: 9.6 mg/dL (ref 8.9–10.3)
Chloride: 98 mmol/L (ref 98–111)
Creatinine, Ser: 2.23 mg/dL — ABNORMAL HIGH (ref 0.61–1.24)
GFR calc Af Amer: 44 mL/min — ABNORMAL LOW (ref >60)
GFR calc non Af Amer: 38 mL/min — ABNORMAL LOW (ref >60)
Glucose, Bld: 132 mg/dL — ABNORMAL HIGH (ref 70–99)
Potassium: 3.2 mmol/L — ABNORMAL LOW (ref 3.5–5.1)
Sodium: 134 mmol/L — ABNORMAL LOW (ref 135–145)
Total Bilirubin: 0.4 mg/dL (ref 0.3–1.2)
Total Protein: 7.2 g/dL (ref 6.5–8.1)

## 2019-06-13 LAB — ETHANOL: Alcohol, Ethyl (B): <10 mg/dL (ref <10)

## 2019-06-13 LAB — MAGNESIUM: Magnesium: 1.9 mg/dL (ref 1.7–2.4)

## 2019-06-13 LAB — PHOSPHORUS: Phosphorus: 2.4 mg/dL — ABNORMAL LOW (ref 2.5–4.6)

## 2019-06-13 LAB — ACETAMINOPHEN LEVEL: Acetaminophen (Tylenol), Serum: <10 ug/mL — ABNORMAL LOW (ref 10–30)

## 2019-06-13 LAB — LACTIC ACID, PLASMA: Lactic Acid, Venous: 6.5 mmol/L (ref 0.5–1.9)

## 2019-06-13 LAB — HIV ANTIBODY (ROUTINE TESTING W REFLEX): HIV Screen 4th Generation wRfx: NONREACTIVE

## 2019-06-13 MED ORDER — STROKE: EARLY STAGES OF RECOVERY BOOK
Freq: Once | Status: AC
  Administered 2019-06-13: 03:00:00
  Filled 2019-06-13: qty 1

## 2019-06-13 MED ORDER — DEXTROSE 50 % IV SOLN
25.0000 mL | Freq: Once | INTRAVENOUS | Status: AC
  Administered 2019-06-14: 25 mL via INTRAVENOUS

## 2019-06-13 MED ORDER — INSULIN ASPART 100 UNIT/ML ~~LOC~~ SOLN
10.0000 [IU] | Freq: Once | SUBCUTANEOUS | Status: AC
  Administered 2019-06-13: 10 [IU] via SUBCUTANEOUS

## 2019-06-13 MED ORDER — ASPIRIN 300 MG RE SUPP
300.0000 mg | Freq: Every day | RECTAL | Status: DC
  Administered 2019-06-13: 300 mg via RECTAL
  Filled 2019-06-13: qty 1

## 2019-06-13 MED ORDER — LORAZEPAM 2 MG/ML IJ SOLN
2.0000 mg | Freq: Once | INTRAMUSCULAR | Status: AC
  Administered 2019-06-13: 2 mg via INTRAVENOUS
  Filled 2019-06-13: qty 1

## 2019-06-13 MED ORDER — INSULIN DETEMIR 100 UNIT/ML ~~LOC~~ SOLN
20.0000 [IU] | Freq: Two times a day (BID) | SUBCUTANEOUS | Status: DC
  Administered 2019-06-13: 20 [IU] via SUBCUTANEOUS
  Filled 2019-06-13 (×4): qty 0.2

## 2019-06-13 MED ORDER — DEXMEDETOMIDINE HCL IN NACL 200 MCG/50ML IV SOLN
0.4000 ug/kg/h | INTRAVENOUS | Status: DC
  Administered 2019-06-13: 0.5 ug/kg/h via INTRAVENOUS
  Administered 2019-06-13: 1 ug/kg/h via INTRAVENOUS
  Administered 2019-06-13: 0.4 ug/kg/h via INTRAVENOUS
  Administered 2019-06-13: 03:00:00 1.2 ug/kg/h via INTRAVENOUS
  Administered 2019-06-13 (×2): 0.4 ug/kg/h via INTRAVENOUS
  Administered 2019-06-13: 1.2 ug/kg/h via INTRAVENOUS
  Filled 2019-06-13 (×3): qty 50
  Filled 2019-06-13 (×2): qty 100

## 2019-06-13 MED ORDER — HALOPERIDOL LACTATE 5 MG/ML IJ SOLN
INTRAMUSCULAR | Status: AC
  Filled 2019-06-13: qty 1

## 2019-06-13 MED ORDER — LORAZEPAM 2 MG/ML IJ SOLN
2.0000 mg | INTRAMUSCULAR | Status: DC | PRN
  Administered 2019-06-13 (×2): 2 mg via INTRAVENOUS
  Filled 2019-06-13: qty 1

## 2019-06-13 MED ORDER — LORAZEPAM 2 MG/ML IJ SOLN
INTRAMUSCULAR | Status: AC
  Administered 2019-06-13: 2 mg
  Filled 2019-06-13: qty 1

## 2019-06-13 MED ORDER — LORAZEPAM 2 MG/ML IJ SOLN
2.0000 mg | Freq: Once | INTRAMUSCULAR | Status: AC
  Administered 2019-06-13: 2 mg via INTRAVENOUS

## 2019-06-13 MED ORDER — HALOPERIDOL LACTATE 5 MG/ML IJ SOLN
5.0000 mg | Freq: Four times a day (QID) | INTRAMUSCULAR | Status: DC | PRN
  Administered 2019-06-13: 5 mg via INTRAMUSCULAR
  Filled 2019-06-13: qty 1

## 2019-06-13 MED ORDER — ASPIRIN 325 MG PO TABS: 325.0000 mg | ORAL_TABLET | Freq: Every day | ORAL | Status: DC

## 2019-06-13 MED ORDER — SODIUM CHLORIDE 0.9 % IV SOLN
INTRAVENOUS | Status: DC | PRN
  Administered 2019-06-13: 1000 mL via INTRAVENOUS
  Administered 2019-06-20: 11:00:00 via INTRAVENOUS

## 2019-06-13 MED ORDER — POTASSIUM CHLORIDE 10 MEQ/100ML IV SOLN
10.0000 meq | INTRAVENOUS | Status: AC
  Administered 2019-06-13 (×2): 10 meq via INTRAVENOUS
  Filled 2019-06-13 (×2): qty 100

## 2019-06-13 MED ORDER — SENNOSIDES-DOCUSATE SODIUM 8.6-50 MG PO TABS: 1.0000 | ORAL_TABLET | Freq: Every evening | ORAL | Status: DC | PRN

## 2019-06-13 MED ORDER — INSULIN ASPART 100 UNIT/ML ~~LOC~~ SOLN
0.0000 [IU] | SUBCUTANEOUS | Status: DC
  Administered 2019-06-14: 16:00:00 5 [IU] via SUBCUTANEOUS
  Administered 2019-06-14 (×2): 2 [IU] via SUBCUTANEOUS
  Administered 2019-06-14: 5 [IU] via SUBCUTANEOUS
  Administered 2019-06-15: 1 [IU] via SUBCUTANEOUS
  Administered 2019-06-15 (×2): 7 [IU] via SUBCUTANEOUS
  Administered 2019-06-15 (×2): 3 [IU] via SUBCUTANEOUS
  Administered 2019-06-16: 5 [IU] via SUBCUTANEOUS
  Administered 2019-06-16: 7 [IU] via SUBCUTANEOUS
  Administered 2019-06-16: 5 [IU] via SUBCUTANEOUS

## 2019-06-13 MED ORDER — ZIPRASIDONE MESYLATE 20 MG IM SOLR
10.0000 mg | Freq: Four times a day (QID) | INTRAMUSCULAR | Status: AC | PRN
  Administered 2019-06-13: 10 mg via INTRAMUSCULAR
  Filled 2019-06-13: qty 20

## 2019-06-13 MED ORDER — MAGNESIUM SULFATE 2 GM/50ML IV SOLN
2.0000 g | Freq: Once | INTRAVENOUS | Status: AC
  Administered 2019-06-13: 2 g via INTRAVENOUS
  Filled 2019-06-13: qty 50

## 2019-06-13 MED ORDER — INSULIN DETEMIR 100 UNIT/ML ~~LOC~~ SOLN
20.0000 [IU] | Freq: Two times a day (BID) | SUBCUTANEOUS | Status: DC
  Filled 2019-06-13: qty 0.2

## 2019-06-13 MED ORDER — ACETAMINOPHEN 160 MG/5ML PO SOLN: 650.0000 mg | ORAL | Status: DC | PRN

## 2019-06-13 MED ORDER — DEXTROSE-NACL 5-0.9 % IV SOLN
INTRAVENOUS | Status: DC
  Administered 2019-06-13: 10:00:00 via INTRAVENOUS

## 2019-06-13 MED ORDER — DEXTROSE 50 % IV SOLN
INTRAVENOUS | Status: AC
  Administered 2019-06-13: 21:00:00 50 mL via INTRAVENOUS
  Filled 2019-06-13: qty 50

## 2019-06-13 MED ORDER — CHLORHEXIDINE GLUCONATE CLOTH 2 % EX PADS
6.0000 | MEDICATED_PAD | Freq: Every day | CUTANEOUS | Status: DC
  Administered 2019-06-13 – 2019-06-20 (×4): 6 via TOPICAL

## 2019-06-13 MED ORDER — ENOXAPARIN SODIUM 40 MG/0.4ML ~~LOC~~ SOLN
40.0000 mg | SUBCUTANEOUS | Status: DC
  Administered 2019-06-13 – 2019-06-20 (×7): 40 mg via SUBCUTANEOUS
  Filled 2019-06-13 (×8): qty 0.4

## 2019-06-13 MED ORDER — ASPIRIN EC 325 MG PO TBEC
325.0000 mg | DELAYED_RELEASE_TABLET | Freq: Every day | ORAL | Status: DC
  Administered 2019-06-14 – 2019-06-20 (×7): 325 mg via ORAL
  Filled 2019-06-13 (×7): qty 1

## 2019-06-13 MED ORDER — DEXTROSE 50 % IV SOLN
50.0000 mL | Freq: Once | INTRAVENOUS | Status: AC
  Administered 2019-06-13 (×2): 50 mL via INTRAVENOUS

## 2019-06-13 MED ORDER — DEXTROSE 50 % IV SOLN
INTRAVENOUS | Status: AC
  Administered 2019-06-13: 50 mL
  Filled 2019-06-13: qty 50

## 2019-06-13 MED ORDER — LORAZEPAM 2 MG/ML IJ SOLN
INTRAMUSCULAR | Status: AC
  Filled 2019-06-13: qty 1

## 2019-06-13 MED ORDER — FENTANYL CITRATE (PF) 100 MCG/2ML IJ SOLN
INTRAMUSCULAR | Status: AC
  Filled 2019-06-13: qty 2

## 2019-06-13 MED ORDER — DEXTROSE 50 % IV SOLN
INTRAVENOUS | Status: AC
  Administered 2019-06-14: 25 mL via INTRAVENOUS
  Filled 2019-06-13: qty 50

## 2019-06-13 MED ORDER — LISINOPRIL 10 MG PO TABS: 10.0000 mg | ORAL_TABLET | Freq: Every day | ORAL | Status: DC

## 2019-06-13 MED ORDER — HALOPERIDOL LACTATE 5 MG/ML IJ SOLN
INTRAMUSCULAR | Status: AC
  Administered 2019-06-13: 5 mg
  Filled 2019-06-13: qty 1

## 2019-06-13 MED ORDER — DEXTROSE 50 % IV SOLN
INTRAVENOUS | Status: AC
  Administered 2019-06-13: 25 g via INTRAVENOUS
  Filled 2019-06-13: qty 50

## 2019-06-13 MED ORDER — INSULIN DETEMIR 100 UNIT/ML ~~LOC~~ SOLN
10.0000 [IU] | Freq: Two times a day (BID) | SUBCUTANEOUS | Status: DC
  Filled 2019-06-13 (×3): qty 0.1

## 2019-06-13 MED ORDER — SODIUM PHOSPHATES 45 MMOLE/15ML IV SOLN
10.0000 mmol | Freq: Once | INTRAVENOUS | Status: AC
  Administered 2019-06-13: 10 mmol via INTRAVENOUS
  Filled 2019-06-13: qty 3.33

## 2019-06-13 MED ORDER — FENTANYL CITRATE (PF) 100 MCG/2ML IJ SOLN: 50.0000 ug | INTRAMUSCULAR | Status: DC | PRN

## 2019-06-13 MED ORDER — FENTANYL CITRATE (PF) 100 MCG/2ML IJ SOLN: 25.0000 ug | INTRAMUSCULAR | Status: DC | PRN

## 2019-06-13 MED ORDER — ACETAMINOPHEN 325 MG PO TABS: 650.0000 mg | ORAL_TABLET | ORAL | Status: DC | PRN

## 2019-06-13 MED ORDER — INSULIN ASPART 100 UNIT/ML ~~LOC~~ SOLN: 0.0000 [IU] | Freq: Three times a day (TID) | SUBCUTANEOUS | Status: DC

## 2019-06-13 MED ORDER — CLEVIDIPINE BUTYRATE 0.5 MG/ML IV EMUL
0.0000 mg/h | INTRAVENOUS | Status: DC
  Administered 2019-06-13: 16 mg/h via INTRAVENOUS
  Administered 2019-06-13: 2 mg/h via INTRAVENOUS
  Administered 2019-06-13: 4 mg/h via INTRAVENOUS
  Administered 2019-06-13: 10 mg/h via INTRAVENOUS
  Filled 2019-06-13: qty 100
  Filled 2019-06-13 (×2): qty 50

## 2019-06-13 MED ORDER — DEXTROSE 50 % IV SOLN
25.0000 g | Freq: Once | INTRAVENOUS | Status: AC
  Administered 2019-06-13: 25 g via INTRAVENOUS

## 2019-06-13 MED ORDER — ACETAMINOPHEN 650 MG RE SUPP
650.0000 mg | RECTAL | Status: DC | PRN
  Administered 2019-06-13: 650 mg via RECTAL
  Filled 2019-06-13: qty 1

## 2019-06-13 NOTE — Progress Notes (Signed)
VASCULAR LAB PRELIMINARY  PRELIMINARY  PRELIMINARY  PRELIMINARY  Bilateral lower extremity venous duplex completed.    Preliminary report:  See CV proc for preliminary results.   George [name], RVT 06/13/2019, 3:50 PM

## 2019-06-13 NOTE — Social Work (Signed)
CSW acknowledging consult entered for current substance abuse assessment. Pt not medically appropriate for assessment at this time, will follow for stability.   Westley Hummer, MSW, [place] Work [phone]

## 2019-06-13 NOTE — Progress Notes (Signed)
Lactic acid 6.3, MD aware. 1L NS currently bolusing and NS ordered at 125 ml/hr.

## 2019-06-13 NOTE — Progress Notes (Signed)
CBG 54. Dr. Lamonte Sakai aware. 1 amp d50 given per protocol. See assoc documentation.

## 2019-06-13 NOTE — Progress Notes (Signed)
PT Cancellation Note  Patient Details Name: George [name] MRN: [id number] DOB: January 26, 1986   Cancelled Treatment:    Reason Eval/Treat Not Completed: Patient not medically ready;Patient's level of consciousness Pt on lots of sedative medications due to agitation. Not appropriate for PT evaluation. Will follow.   [name] 06/13/2019, 7:50 AM Wray Kearns, PT, DPT Acute Rehabilitation Services Pager [phone] Office [phone]

## 2019-06-13 NOTE — Progress Notes (Signed)
OT Cancellation Note  Patient Details Name: George [name] MRN: [id number] DOB: 1986/02/21   Cancelled Treatment:    Reason Eval/Treat Not Completed: Medical issues which prohibited therapy. Patient's level of consciousness. Pt on lots of sedative medications due to agitation. Will return as schedule allows. Thank you.  [place], OTR/L Acute Rehab Pager: [phone] Office: [phone] 06/13/2019, 8:17 AM

## 2019-06-13 NOTE — Progress Notes (Signed)
HR 180s. Dr. Lamonte Sakai aware and at bedside. Restarted precedex per Dr. Lamonte Sakai. Will continue to closely monitor pt.

## 2019-06-13 NOTE — Progress Notes (Signed)
VASCULAR LAB PRELIMINARY  PRELIMINARY  PRELIMINARY  PRELIMINARY  Carotid duplex completed.    Preliminary report:  See CV proc for preliminary results.   Evalette Montrose, RVT 06/13/2019, 3:50 PM

## 2019-06-13 NOTE — Progress Notes (Signed)
Vial of Ativan pulled from pyxis, at time of withdrawal order was for 1mg . Prior to administration, order amount was changed to 2mg . Pyxis is requiring a waste, however due to the change in order, the full 2mg  was administered.

## 2019-06-13 NOTE — Progress Notes (Addendum)
STROKE TEAM PROGRESS NOTE   INTERVAL HISTORY RN at bedside. I also discussed with Dr. Lamonte Sakai in the hallway. Pt still agitated and received multiple dose of ativan, haldol and IV geodon overnight. Currently on precedex. He is obtunded and able to open eyes with pain, trying to get up but in 4 point restrain. Not following commands.   Vitals:   06/13/19 0615 06/13/19 0630 06/13/19 0645 06/13/19 0700  BP: (!) 142/84 (!) 145/91 (!) 143/95 (!) 145/89  Pulse: 92 88 87 82  Resp: (!) 25 (!) 27 (!) 1 (!) 0  Temp:      TempSrc:      SpO2: 97% 96% 96% 97%  Weight:      Height:        CBC:  Recent Labs  Lab 06/12/19 2305 06/12/19 2316 06/13/19 0251  WBC 21.9*  --  35.6*  NEUTROABS 19.1*  --   --   HGB 17.1* 17.3* 14.2  HCT 48.2 51.0 39.8  MCV 85.9  --  86.9  PLT 335  --  419    Basic Metabolic Panel:  Recent Labs  Lab 06/12/19 2305 06/12/19 2316 06/13/19 0251  NA 134* 135 134*  K 3.2* 3.2* 3.8  CL 98  --  101  CO2 23  --  16*  GLUCOSE 132*  --  341*  BUN 14  --  15  CREATININE 2.23*  --  2.54*  CALCIUM 9.6  --  8.5*  MG 1.9  --   --   PHOS 2.4*  --   --    Lipid Panel:     Component Value Date/Time   CHOL 215 (H) 06/13/2019 0251   TRIG 298 (H) 06/13/2019 0251   HDL 53 06/13/2019 0251   CHOLHDL 4.1 06/13/2019 0251   VLDL 60 (H) 06/13/2019 0251   LDLCALC 102 (H) 06/13/2019 0251   HgbA1c:  Lab Results  Component Value Date   HGBA1C 8.4 (H) 06/13/2019   Urine Drug Screen:     Component Value Date/Time   LABOPIA NONE DETECTED 06/13/2019 0423   COCAINSCRNUR NONE DETECTED 06/13/2019 0423   LABBENZ NONE DETECTED 06/13/2019 0423   AMPHETMU NONE DETECTED 06/13/2019 0423   THCU POSITIVE (A) 06/13/2019 0423   LABBARB NONE DETECTED 06/13/2019 0423    Alcohol Level     Component Value Date/Time   ETH <10 06/12/2019 2305    IMAGING Ct Head Wo Contrast  Result Date: 06/12/2019 CLINICAL DATA:  33 y/o M; altered level of consciousness. Expressive aphasia, type 1  diabetes. EXAM: CT HEAD WITHOUT CONTRAST TECHNIQUE: Contiguous axial images were obtained from the base of the skull through the vertex without intravenous contrast. COMPARISON:  None. FINDINGS: Brain: Hypoattenuation with loss of gray-white differentiation involving left occipital lobe and left medial temporal lobe. No hemorrhage, extra-axial collection, hydrocephalus, or herniation. Vascular: No hyperdense vessel or unexpected calcification. Skull: Normal. Negative for fracture or focal lesion. Sinuses/Orbits: No acute finding. Other: None. IMPRESSION: Large area of hypoattenuation in the left PCA distribution probably representing a late acute to early subacute infarction. No hemorrhage or gross mass effect. Differential of underlying neoplasm or cerebritis is less likely given typical vascular distribution. These results were called by telephone at the time of interpretation on 06/12/2019 at 10:08 pm to Dr. Charlesetta Shanks , who verbally acknowledged these results. Electronically Signed   By: Kristine Garbe M.D.   On: 06/12/2019 22:13    PHYSICAL EXAM  Temp:  [98.4 F (36.9 C)-98.6 F (37  C)] 98.4 F (36.9 C) (06/15 0800) Pulse Rate:  [74-198] 74 (06/15 0900) Resp:  [0-35] 25 (06/15 0800) BP: (102-205)/(76-141) 139/92 (06/15 0900) SpO2:  [93 %-100 %] 96 % (06/15 0900) Weight:  [56.7 kg] 56.7 kg (06/14 2128)  General - Well nourished, well developed, obtunded on precedex.  Ophthalmologic - fundi not visualized due to noncooperation.  Cardiovascular - Regular rhythm, but tachycardia.  Neuro - obtunded on precedex, eyes closed, not following commands. able to open eyes on pain stimulation, trying to get up but in 4 point restrain, with eyes open, pupil equal but sluggish to light bilaterally, positive corneal bilaterally. Eyes in mid position, blinking to visual threat, doll's eyes sluggish, not tracking, no disconjugation. Nasolabial fold symmetric.  Tongue midline in mouth. On pain  stimulation, move all extremities equally, not able to do full exam due to restrain. DTR 1+ and no babinski. Sensation, coordination and gait not tested.    ASSESSMENT/PLAN [name] is a 33 y.o. male with history of type I DB presenting with receptive aphasia and word salad responses, not oriented with HA.   Stroke: Large L PCA infarct, suspect embolic source, workup underway  CT head 6/14 2152 large L PCA infarct  MRI  pending  MRA  pending  Carotid Doppler  pending  2D Echo pending  LE venous dopplers pending  May consider TEE later if needed given severe leukocytosis and young age   Will consider hypercoagulable/vasculitis work up once appropriate  Will consider TCD bubble study once stable  LDL 102  HgbA1c 8.4  Lovenox 40 mg sq daily for VTE prophylaxis  No antithrombotic prior to admission, now on aspirin 325 mg or ASA PR 300mg  daily.   Therapy recommendations:  pending   Disposition:  pending   Extreme Agitation  Required mult meds for sedation including precedex  Restrained  CCM managing  MRI pending  Hypertension/Tachycardia  No hx HTN. Not on home meds  BP as high as 205/121  D/t agitation  Constant tachycardia  CCM on board  ?? AKI on CKD  No previous Cre to compare  Cr 2.23->2.54  Urine protein large  May consider renal ultrasound for further evaluation  On IV fluid  Hyperlipidemia  Home meds:  No statin  LDL 102, goal < 70  Add statin once able to swallow  Plan statin at discharge  Diabetes type I Uncontrolled Hypoglycemic episode  HgbA1c 8.4, goal < 7.0  CBGs  SSI  On Levemir  On D5 normal saline @ 50cc  CCM on board  Leukocytosis  WBC 21.9->35.6  Likely reactive  Lactic acid 2.5-> 6.5  CXR no active disease  UA negative  Blood culture pending  Afebrile  Dysphagia  Currently NPO  For stroke swallow assessment  Other Stroke Risk Factors  ETOH use, advised to drink no more  than 2 drink(s) a day  THC use - UDS positive for THC this admission   Other [facility] day # 0  This patient is critically ill due to left PCA stroke, extreme agitation, DM type I, severe leukocytosis, AK on CKD and at significant risk of neurological worsening, death form recurrent stroke, hemorrhagic conversion, DKA, sepsis, renal failure. This patient's care requires constant monitoring of vital signs, hemodynamics, respiratory and cardiac monitoring, review of multiple databases, neurological assessment, discussion with family, other specialists and medical decision making of high complexity. I spent 40 minutes of neurocritical care time in the care of this patient. I  have discussed with Dr. Mont Dutton, MD PhD Stroke Neurology 06/13/2019 1:51 PM   To contact Stroke Continuity provider, please refer to [email or web address] After hours, contact General Neurology

## 2019-06-13 NOTE — Progress Notes (Signed)
eLink Physician-Brief Progress Note Patient Name: Dayvin Aber DOB: [birth year] MRN: [id number]   Date of Service  06/13/2019  HPI/Events of Note  33 y/o M history of Type 1 DM presented with speech difficulty and headache upon waking up this morning. Found to have a large area of hypoattenaution in the left PCA distribution.  Beyond the time for TPA, neuro consulted.  eICU Interventions   BP control on Cleviprex  Glucose control with SSI  Has receive Ativan and Haldol for agitation, ordered Geodon  Aspiration precaution     Intervention Category Major Interventions: Change in mental status - evaluation and management;Hypertension - evaluation and management Intermediate Interventions: Hyperglycemia - evaluation and treatment Minor Interventions: Agitation / anxiety - evaluation and management Evaluation Type: New Patient Evaluation  Judd Lien 06/13/2019, 2:19 AM

## 2019-06-13 NOTE — Progress Notes (Deleted)
[facility] Progress Note Patient Name: George [name] DOB: September 21, 1986 MRN: [id number]   Date of Service  06/13/2019  HPI/Events of Note  ABG 7.51/31/74 AC 24/490/40%5 PEEP Not breathing over set rate  eICU Interventions  Decrease rate to 18     Intervention Category Major Interventions: Acid-Base disturbance - evaluation and management  Judd Lien 06/13/2019, 4:22 AM

## 2019-06-13 NOTE — Progress Notes (Signed)
Inpatient Diabetes Program Recommendations  AACE/ADA: New Consensus Statement on Inpatient Glycemic Control (2015)  Target Ranges:  Prepandial:   less than 140 mg/dL      Peak postprandial:   less than 180 mg/dL (1-2 hours)      Critically ill patients:  140 - 180 mg/dL   Results for GEAN, LAROSE (MRN [id number]) as of 06/13/2019 11:35  Ref. Range 06/13/2019 01:14 06/13/2019 03:18 06/13/2019 06:15 06/13/2019 08:32 06/13/2019 09:05  Glucose-Capillary Latest Ref Range: 70 - 99 mg/dL 210 (H) 335 (H) 145 (H) 54 (L) 162 (H)    Review of Glycemic Control  Diabetes history: DM 1 Outpatient Diabetes medications: Self dosing from [employer] insulins  Current orders for Inpatient glycemic control:  Levemir 20 units BID Novolog 0-15 units tid  Inpatient Diabetes Program Recommendations:    Hypoglycemia 54 this am. Patient sensitive to insulin with type 1 DM. Calculating weight based requirements for insulin dosing consider:   -  Levemir 10 units BID (starting tomorrow 6/16)  -  Reducing Novolog to 0-9 units Q4 hours  Called patient's mom to inquire about pt having physician or pharmacy I could contact for insulin dosing. Mom states patient was told by an urgent care a few years back about the [employer] insulin. Patient has been self dosing ever since. Current A1c this admission 8.4%.  Thanks,  Tama Headings RN, MSN, BC-ADM Inpatient Diabetes Coordinator Team Pager [phone] (8a-5p)

## 2019-06-13 NOTE — Progress Notes (Signed)
Pt with several episodes of extreme agitation, attempting to hit head on siderails, grabbing staff, tachycardic to the 200s, tachypneic to the 50s. Precedex running max rate, Geodon, Ativan and Haldol given x1. Pt will spontaneously arouse from a sedated state in extreme agitation. Globally aphasic so unable to redirect. Siderails padded x4, floor mats down for patient safety.

## 2019-06-13 NOTE — Progress Notes (Signed)
Called and spoke with patients mother on the phone. Updated on patient status.

## 2019-06-13 NOTE — Progress Notes (Addendum)
PULMONARY / CRITICAL CARE MEDICINE   Name: George [name] MRN: [id number] DOB: 10-May-1986    ADMISSION DATE:  06/12/2019 CONSULTATION DATE:  06/12/2019  REFERRING MD:  Charlesetta Shanks, MD  CHIEF COMPLAINT:  headache  HISTORY OF PRESENT ILLNESS:   33 year old male history of type 1 diabetes who presented to the emergency department with complaints of speech difficulty, "holding on to the wall" at home and headache. Last known normal was around 8 pm Friday night, then awoke Saturday morning with above symptoms.    He was seen by neurology who noted receptive aphasia, not oriented, difficulty comprehending commands.  He was out of the window for TPA and was admitted to critical care medicine for intensive blood pressure control and stroke monitoring.  Developed progressive agitated encehalopathy  Interval events/ Subjective: Very agitated overnight, required multiple medications for sedation, restraints Currently Precedex Episode hypoglycemia this morning Elevated lactate, normotensive, Cleviprex is off  VITAL SIGNS: BP (!) 138/91   Pulse 79   Temp 98.4 F (36.9 C) (Axillary)   Resp (!) 25   Ht 5\' 5"  (1.651 m)   Wt 56.7 kg   SpO2 96%   BMI 20.80 kg/m   HEMODYNAMICS:    VENTILATOR SETTINGS:    INTAKE / OUTPUT: I/O last 3 completed shifts: In: 2829.5 [I.V.:1502.6; IV Piggyback:1327] Out: -   PHYSICAL EXAMINATION: Gen ill-appearing man, sleeping, somewhat disheveled HEENT oropharynx clear, appears to be protecting airway Neck no stridor, no upper airway secretions noted Lungs clear bilaterally CV regular, no murmur abd soft, nondistended, no apparent tenderness, positive bowel sounds Ext no edema Neuro pupils are 4 mm and do react, he moves all 4 extremities to stimulation, currently restrained, he is unable to phonate or follow any commands.  Currently on Precedex 0.6 (note did receive high-dose sedation overnight due to his severe encephalopathy /  agitation)    LABS:  BMET Recent Labs  Lab 06/12/19 2305 06/12/19 2316 06/13/19 0251  NA 134* 135 134*  K 3.2* 3.2* 3.8  CL 98  --  101  CO2 23  --  16*  BUN 14  --  15  CREATININE 2.23*  --  2.54*  GLUCOSE 132*  --  341*    Electrolytes Recent Labs  Lab 06/12/19 2305 06/13/19 0251  CALCIUM 9.6 8.5*  MG 1.9  --   PHOS 2.4*  --     CBC Recent Labs  Lab 06/12/19 2305 06/12/19 2316 06/13/19 0251  WBC 21.9*  --  35.6*  HGB 17.1* 17.3* 14.2  HCT 48.2 51.0 39.8  PLT 335  --  274    Coag's Recent Labs  Lab 06/12/19 2305  INR 0.9    Sepsis Markers Recent Labs  Lab 06/12/19 2305 06/13/19 0251  LATICACIDVEN 2.5* 6.5*    ABG No results for input(s): PHART, PCO2ART, PO2ART in the last 168 hours.  Liver Enzymes Recent Labs  Lab 06/12/19 2305  AST 22  ALT 16  ALKPHOS 128*  BILITOT 0.4  ALBUMIN 3.7    Cardiac Enzymes No results for input(s): TROPONINI, PROBNP in the last 168 hours.  Glucose Recent Labs  Lab 06/12/19 2344 06/13/19 0114 06/13/19 0318 06/13/19 0615 06/13/19 0832  GLUCAP 118* 210* 335* 145* 54*    Imaging Ct Head Wo Contrast  Result Date: 06/12/2019 CLINICAL DATA:  33 y/o M; altered level of consciousness. Expressive aphasia, type 1 diabetes. EXAM: CT HEAD WITHOUT CONTRAST TECHNIQUE: Contiguous axial images were obtained from the base of the skull through the  vertex without intravenous contrast. COMPARISON:  None. FINDINGS: Brain: Hypoattenuation with loss of gray-white differentiation involving left occipital lobe and left medial temporal lobe. No hemorrhage, extra-axial collection, hydrocephalus, or herniation. Vascular: No hyperdense vessel or unexpected calcification. Skull: Normal. Negative for fracture or focal lesion. Sinuses/Orbits: No acute finding. Other: None. IMPRESSION: Large area of hypoattenuation in the left PCA distribution probably representing a late acute to early subacute infarction. No hemorrhage or gross  mass effect. Differential of underlying neoplasm or cerebritis is less likely given typical vascular distribution. These results were called by telephone at the time of interpretation on 06/12/2019 at 10:08 pm to Dr. Charlesetta Shanks , who verbally acknowledged these results. Electronically Signed   By: Kristine Garbe M.D.   On: 06/12/2019 22:13     STUDIES:  [facility] 6/14 completed left occipital and posterior temporal lobe ischemic infarction  CULTURES: Blood 6/15 >>   ANTIBIOTICS: none  SIGNIFICANT EVENTS: 6/14 p/w aphasia, confusion, agitation  LINES/TUBES: none  DISCUSSION: Family updated by ED provider 6/14  ASSESSMENT / PLAN: 33 year old male history of type 1 diabetes who presented to the emergency department with complaints of speech difficulty, "stumbling into things" at home and headache.  Found to have a completed left occipital and posterior temporal lobe ischemic infarction.  Completed left occipital and temporal lobe stroke Extreme agitation, improved THC positive P:   RASS goal: -1, -2 to facilitate follow-up MRI SBP goal 140-150 Aspirin as ordered Echocardiogram pending MRI planned for 6/15.  Will need to balance his Precedex carefully to allow him to tolerate yet also maintain airway protection. Speech therapy, PT/OT  At some risk for respiratory failure due to airway protection, currently adequate Follow airway protection closely given sedation needs, the risk of evolving neurological deficits Pulmonary hygiene  Hypertension, tachycardia Suspect related to severe agitation.  No meds at this time TSH reassuring, echocardiogram ordered   Acute (? On chronic) kidney injury Unclear baseline.  Received single dose lisinopril early 6/15.  Stop now given evolving renal insufficiency.  He will likely benefit from this chronically, try to restart once acute issue stable Follow urine output, BMP with adequate renal perfusion Control hypertension    Diabetes mellitus type I Episode of hypoglycemia 6/15 Home regimen not clear at this time, will need to speak with him about this when he is able to participate Sliding-scale insulin for now  Significant leukocytosis, suspect reactive in absence of any clear infectious source Follow clinically for any evidence of active infection.  Chest x-ray reassuring 6/15 Check blood cx today 6/15, ? At risk endocarditis as cause CVA His COVID screening is pending; low risk patient  Suspected dysphagia Speech therapy evaluation, swallow evaluation when able to participate N.p.o. Consider tube feeding next 24 hours  FAMILY  - Updates: mother updated 6/15 AM by Dr. Dellie Catholic   Independent CC time 71 minutes  Baltazar Apo, MD, PhD 06/13/2019, 9:24 AM [facility] [phone] or if no answer [phone]

## 2019-06-13 NOTE — Progress Notes (Signed)
Discussed pending CT and MRI with CCM. Due to patient's extreme agitation, will hold off.

## 2019-06-13 NOTE — ED Notes (Signed)
Cleviprex restarted at 4mg /hr

## 2019-06-14 ENCOUNTER — Inpatient Hospital Stay (HOSPITAL_COMMUNITY): Payer: Medicaid Other

## 2019-06-14 DIAGNOSIS — R000 Tachycardia, unspecified: Secondary | ICD-10-CM

## 2019-06-14 DIAGNOSIS — I63432 Cerebral infarction due to embolism of left posterior cerebral artery: Principal | ICD-10-CM

## 2019-06-14 DIAGNOSIS — N179 Acute kidney failure, unspecified: Secondary | ICD-10-CM

## 2019-06-14 DIAGNOSIS — R451 Restlessness and agitation: Secondary | ICD-10-CM

## 2019-06-14 LAB — TROPONIN I
Troponin I: 0.24 ng/mL (ref <0.03)
Troponin I: 0.28 ng/mL (ref <0.03)

## 2019-06-14 LAB — CBC
HCT: 33.7 % — ABNORMAL LOW (ref 39.0–52.0)
Hemoglobin: 11.5 g/dL — ABNORMAL LOW (ref 13.0–17.0)
MCH: 30.2 pg (ref 26.0–34.0)
MCHC: 34.1 g/dL (ref 30.0–36.0)
MCV: 88.5 fL (ref 80.0–100.0)
Platelets: 196 10*3/uL (ref 150–400)
RBC: 3.81 MIL/uL — ABNORMAL LOW (ref 4.22–5.81)
RDW: 13.3 % (ref 11.5–15.5)
WBC: 14.2 10*3/uL — ABNORMAL HIGH (ref 4.0–10.5)
nRBC: 0 % (ref 0.0–0.2)

## 2019-06-14 LAB — BASIC METABOLIC PANEL
Anion gap: 7 (ref 5–15)
BUN: 12 mg/dL (ref 6–20)
CO2: 22 mmol/L (ref 22–32)
Calcium: 8 mg/dL — ABNORMAL LOW (ref 8.9–10.3)
Chloride: 111 mmol/L (ref 98–111)
Creatinine, Ser: 2.24 mg/dL — ABNORMAL HIGH (ref 0.61–1.24)
GFR calc Af Amer: 43 mL/min — ABNORMAL LOW (ref >60)
GFR calc non Af Amer: 37 mL/min — ABNORMAL LOW (ref >60)
Glucose, Bld: 105 mg/dL — ABNORMAL HIGH (ref 70–99)
Potassium: 3.6 mmol/L (ref 3.5–5.1)
Sodium: 140 mmol/L (ref 135–145)

## 2019-06-14 LAB — GLUCOSE, CAPILLARY
Glucose-Capillary: 102 mg/dL — ABNORMAL HIGH (ref 70–99)
Glucose-Capillary: 136 mg/dL — ABNORMAL HIGH (ref 70–99)
Glucose-Capillary: 157 mg/dL — ABNORMAL HIGH (ref 70–99)
Glucose-Capillary: 177 mg/dL — ABNORMAL HIGH (ref 70–99)
Glucose-Capillary: 258 mg/dL — ABNORMAL HIGH (ref 70–99)
Glucose-Capillary: 294 mg/dL — ABNORMAL HIGH (ref 70–99)
Glucose-Capillary: 463 mg/dL — ABNORMAL HIGH (ref 70–99)

## 2019-06-14 LAB — NOVEL CORONAVIRUS, NAA (HOSP ORDER, SEND-OUT TO REF LAB; TAT 18-24 HRS): SARS-CoV-2, NAA: NOT DETECTED

## 2019-06-14 LAB — MAGNESIUM: Magnesium: 2.1 mg/dL (ref 1.7–2.4)

## 2019-06-14 LAB — LACTIC ACID, PLASMA: Lactic Acid, Venous: 1.7 mmol/L (ref 0.5–1.9)

## 2019-06-14 LAB — SEDIMENTATION RATE: Sed Rate: 32 mm/hr — ABNORMAL HIGH (ref 0–16)

## 2019-06-14 LAB — PHOSPHORUS: Phosphorus: 3.7 mg/dL (ref 2.5–4.6)

## 2019-06-14 LAB — C-REACTIVE PROTEIN: CRP: 3.8 mg/dL — ABNORMAL HIGH (ref <1.0)

## 2019-06-14 MED ORDER — METOPROLOL TARTRATE 5 MG/5ML IV SOLN
INTRAVENOUS | Status: AC
  Filled 2019-06-14: qty 5

## 2019-06-14 MED ORDER — POTASSIUM CHLORIDE 20 MEQ/15ML (10%) PO SOLN
40.0000 meq | Freq: Once | ORAL | Status: DC
  Filled 2019-06-14: qty 30

## 2019-06-14 MED ORDER — LABETALOL HCL 5 MG/ML IV SOLN
5.0000 mg | Freq: Once | INTRAVENOUS | Status: AC
  Administered 2019-06-14: 11:00:00 5 mg via INTRAVENOUS

## 2019-06-14 MED ORDER — POTASSIUM CHLORIDE CRYS ER 20 MEQ PO TBCR
40.0000 meq | EXTENDED_RELEASE_TABLET | Freq: Once | ORAL | Status: AC
  Administered 2019-06-14: 40 meq via ORAL
  Filled 2019-06-14: qty 2

## 2019-06-14 MED ORDER — CLEVIDIPINE BUTYRATE 0.5 MG/ML IV EMUL
0.0000 mg/h | INTRAVENOUS | Status: DC
  Administered 2019-06-14 (×2): 8 mg/h via INTRAVENOUS
  Administered 2019-06-14: 9 mg/h via INTRAVENOUS
  Administered 2019-06-14: 1 mg/h via INTRAVENOUS
  Administered 2019-06-15: 3 mg/h via INTRAVENOUS
  Filled 2019-06-14 (×5): qty 50

## 2019-06-14 MED ORDER — HYDRALAZINE HCL 20 MG/ML IJ SOLN
10.0000 mg | Freq: Once | INTRAMUSCULAR | Status: AC
  Administered 2019-06-14: 11:00:00 10 mg via INTRAVENOUS

## 2019-06-14 MED ORDER — HYDRALAZINE HCL 20 MG/ML IJ SOLN
10.0000 mg | INTRAMUSCULAR | Status: DC | PRN
  Administered 2019-06-14: 10 mg via INTRAVENOUS
  Filled 2019-06-14 (×2): qty 1

## 2019-06-14 MED ORDER — ESMOLOL HCL-SODIUM CHLORIDE 2000 MG/100ML IV SOLN
25.0000 ug/kg/min | INTRAVENOUS | Status: DC
  Administered 2019-06-14: 200 ug/kg/min via INTRAVENOUS
  Administered 2019-06-14: 25 ug/kg/min via INTRAVENOUS
  Administered 2019-06-15: 175 ug/kg/min via INTRAVENOUS
  Administered 2019-06-15: 100 ug/kg/min via INTRAVENOUS
  Administered 2019-06-15 (×3): 200 ug/kg/min via INTRAVENOUS
  Filled 2019-06-14 (×7): qty 100

## 2019-06-14 MED ORDER — INSULIN ASPART 100 UNIT/ML ~~LOC~~ SOLN
10.0000 [IU] | Freq: Once | SUBCUTANEOUS | Status: AC
  Administered 2019-06-14: 10 [IU] via SUBCUTANEOUS

## 2019-06-14 MED ORDER — METOPROLOL TARTRATE 5 MG/5ML IV SOLN
5.0000 mg | Freq: Four times a day (QID) | INTRAVENOUS | Status: DC | PRN
  Administered 2019-06-14 – 2019-06-15 (×2): 5 mg via INTRAVENOUS
  Filled 2019-06-14: qty 5

## 2019-06-14 MED ORDER — METOPROLOL TARTRATE 5 MG/5ML IV SOLN
5.0000 mg | Freq: Once | INTRAVENOUS | Status: AC
  Administered 2019-06-14: 5 mg via INTRAVENOUS
  Filled 2019-06-14: qty 5

## 2019-06-14 MED ORDER — ATORVASTATIN CALCIUM 40 MG PO TABS
40.0000 mg | ORAL_TABLET | Freq: Every day | ORAL | Status: DC
  Administered 2019-06-14 – 2019-06-19 (×6): 40 mg via ORAL
  Filled 2019-06-14 (×6): qty 1

## 2019-06-14 MED ORDER — AMLODIPINE BESYLATE 5 MG PO TABS
5.0000 mg | ORAL_TABLET | Freq: Every day | ORAL | Status: DC
  Administered 2019-06-14: 5 mg via ORAL
  Filled 2019-06-14: qty 1

## 2019-06-14 MED ORDER — LABETALOL HCL 5 MG/ML IV SOLN
5.0000 mg | Freq: Four times a day (QID) | INTRAVENOUS | Status: DC | PRN
  Administered 2019-06-14: 5 mg via INTRAVENOUS
  Filled 2019-06-14 (×3): qty 4

## 2019-06-14 MED ORDER — CLOPIDOGREL BISULFATE 75 MG PO TABS
75.0000 mg | ORAL_TABLET | Freq: Every day | ORAL | Status: DC
  Administered 2019-06-14 – 2019-06-20 (×7): 75 mg via ORAL
  Filled 2019-06-14 (×8): qty 1

## 2019-06-14 NOTE — Progress Notes (Signed)
eLink Physician-Brief Progress Note Patient Name: George [name] DOB: 02/25/1986 MRN: [id number]   Date of Service  06/14/2019  HPI/Events of Note  Hyperglycemia - started diet today   eICU Interventions  Insulin aspart 10 units x 1 now  To be repeated in a couple hours      Intervention Category Major Interventions: Hyperglycemia - active titration of insulin therapy  Margaretmary Lombard 06/14/2019, 8:53 PM

## 2019-06-14 NOTE — Progress Notes (Signed)
  Echocardiogram 2D Echocardiogram has been attempted. Patient HR elevated to 172 BPM after walking to restroom. Will reattempt at later time.  George [name] G [name] 06/14/2019, 2:20 PM

## 2019-06-14 NOTE — Progress Notes (Signed)
Patient blood sugar 456. CCM called. One time dose of 10 units given per MD. Will recheck at midnight.

## 2019-06-14 NOTE — Progress Notes (Signed)
CRITICAL VALUE ALERT  Critical Value:  Troponin, 0.24  Date & Time Notied:  06/14/2019, 1600  Provider Notified: Dr Bernita Raisin  Orders Received/Actions taken: No new orders. Continue to monitor. Waiting on Cardiology consult

## 2019-06-14 NOTE — Progress Notes (Signed)
PULMONARY / CRITICAL CARE MEDICINE   Name: George [name] MRN: [id number] DOB: Aug 31, 1986    ADMISSION DATE:  06/12/2019 CONSULTATION DATE:  06/12/2019  REFERRING MD:  Charlesetta Shanks, MD  CHIEF COMPLAINT:  headache  HISTORY OF PRESENT ILLNESS:   33 year old male history of type 1 diabetes who presented to the emergency department with complaints of speech difficulty, "holding on to the wall" at home and headache. Last known normal was around 8 pm Friday night, then awoke Saturday morning with above symptoms.    He was seen by neurology who noted receptive aphasia, not oriented, difficulty comprehending commands.  He was out of the window for TPA and was admitted to critical care medicine for intensive blood pressure control and stroke monitoring.  Developed progressive agitated encehalopathy  Interval events/ Subjective: Awake and alert, calm and appropriate Precedex off this morning Continued episodes of hypoglycemia Cleviprex is off, BP remains elevated Passed swallow HGB A1C 8.4 on admission  VITAL SIGNS: BP (!) 168/100   Pulse 88   Temp 98.2 F (36.8 C) (Oral)   Resp 20   Ht 5\' 5"  (1.651 m)   Wt 56.7 kg   SpO2 98%   BMI 20.80 kg/m   HEMODYNAMICS:    VENTILATOR SETTINGS:    INTAKE / OUTPUT: I/O last 3 completed shifts: In: 4427 [I.V.:2913.7; IV Piggyback:1513.3] Out: 2125 [Urine:2125]  PHYSICAL EXAMINATION: Gen Awake and alert young male supine in bed HEENT oropharynx clear, appears to be protecting airway Neck no stridor, no upper airway secretions noted Lungs bilateral chest excursion, clear bilaterally CV S1. S2, RRR No RMG abd soft, nondistended, no apparent tenderness, positive bowel sounds Ext no edema Neuro PERRLA, awake and alert, appropriate, follows commands, MAE x 4   LABS:  BMET Recent Labs  Lab 06/12/19 2305 06/12/19 2316 06/13/19 0251 06/14/19 0218  NA 134* 135 134* 140  K 3.2* 3.2* 3.8 3.6  CL 98  --  101 111  CO2 23  --  16*  22  BUN 14  --  15 12  CREATININE 2.23*  --  2.54* 2.24*  GLUCOSE 132*  --  341* 105*    Electrolytes Recent Labs  Lab 06/12/19 2305 06/13/19 0251 06/14/19 0218  CALCIUM 9.6 8.5* 8.0*  MG 1.9  --  2.1  PHOS 2.4*  --  3.7    CBC Recent Labs  Lab 06/12/19 2305 06/12/19 2316 06/13/19 0251 06/14/19 0218  WBC 21.9*  --  35.6* 14.2*  HGB 17.1* 17.3* 14.2 11.5*  HCT 48.2 51.0 39.8 33.7*  PLT 335  --  274 196    Coag's Recent Labs  Lab 06/12/19 2305  INR 0.9    Sepsis Markers Recent Labs  Lab 06/12/19 2305 06/13/19 0251  LATICACIDVEN 2.5* 6.5*    ABG No results for input(s): PHART, PCO2ART, PO2ART in the last 168 hours.  Liver Enzymes Recent Labs  Lab 06/12/19 2305  AST 22  ALT 16  ALKPHOS 128*  BILITOT 0.4  ALBUMIN 3.7    Cardiac Enzymes No results for input(s): TROPONINI, PROBNP in the last 168 hours.  Glucose Recent Labs  Lab 06/13/19 1958 06/13/19 2019 06/13/19 2338 06/14/19 0006 06/14/19 0402 06/14/19 0823  GLUCAP 52* 164* 67* 136* 102* 177*    Imaging Dg Chest Port 1 View  Result Date: 06/13/2019 CLINICAL DATA:  Leukocytosis. EXAM: PORTABLE CHEST 1 VIEW COMPARISON:  None. FINDINGS: The heart size and mediastinal contours are within normal limits. Both lungs are clear. The visualized skeletal structures  are unremarkable. IMPRESSION: No active disease. Electronically Signed   By: Earle Gell M.D.   On: 06/13/2019 09:10   Vas US Carotid  Result Date: 06/14/2019 Carotid Arterial Duplex Study Indications:       CVA, Speech disturbance and Gait disturbance. Risk Factors:      Diabetes. Limitations:       Patient would not keep head turned. Comparison Study:  No prior study on file for comparison. Performing Technologist: Sharion Dove RVS  Examination Guidelines: A complete evaluation includes B-mode imaging, spectral Doppler, color Doppler, and power Doppler as needed of all accessible portions of each vessel. Bilateral testing is considered  an integral part of a complete examination. Limited examinations for reoccurring indications may be performed as noted.  Right Carotid Findings: +----------+--------+--------+--------+--------+--------+           PSV cm/sEDV cm/sStenosisDescribeComments +----------+--------+--------+--------+--------+--------+ CCA Prox  156     12                               +----------+--------+--------+--------+--------+--------+ CCA Distal111     17                               +----------+--------+--------+--------+--------+--------+ ICA Prox  92      12                               +----------+--------+--------+--------+--------+--------+ ICA Mid                                   tortuous +----------+--------+--------+--------+--------+--------+ ICA Distal93      20                      tortuous +----------+--------+--------+--------+--------+--------+ ECA       74      9                       tortuous +----------+--------+--------+--------+--------+--------+ +----------+--------+-------+--------+-------------------+           PSV cm/sEDV cmsDescribeArm Pressure (mmHG) +----------+--------+-------+--------+-------------------+ [id number]                                        +----------+--------+-------+--------+-------------------+ +---------+--------+--+--------+-+ VertebralPSV cm/s45EDV cm/s8 +---------+--------+--+--------+-+  Left Carotid Findings: +----------+--------+--------+--------+--------+--------+           PSV cm/sEDV cm/sStenosisDescribeComments +----------+--------+--------+--------+--------+--------+ CCA Prox  171     27                               +----------+--------+--------+--------+--------+--------+ CCA Distal138     27                               +----------+--------+--------+--------+--------+--------+ +----------+--------+--------+--------+-------------------+ SubclavianPSV cm/sEDV cm/sDescribeArm  Pressure (mmHG) +----------+--------+--------+--------+-------------------+           183                                         +----------+--------+--------+--------+-------------------+ +---------+--------+--+--------+--+ VertebralPSV cm/s73EDV cm/s12 +---------+--------+--+--------+--+  Summary: Right Carotid: The extracranial vessels were near-normal with only minimal wall                thickening or plaque. Highly tortuous ICA. Left Carotid: Unable to scan left ICA and ECA secondary to patient compliance. Vertebrals:  Bilateral vertebral arteries demonstrate antegrade flow. Subclavians: Normal flow hemodynamics were seen in bilateral subclavian              arteries. *See table(s) above for measurements and observations.  Electronically signed by Antony Contras MD on 06/14/2019 at 7:59:14 AM.    Final    Vas [place] Lower Extremity Venous (dvt)  Result Date: 06/13/2019  Lower Venous Study Indications: Stroke. Other Indications: Patient in 4 point restraints with feet tied together. Comparison Study: No prior study on file for comparison. Performing Technologist: Sharion Dove RVS  Examination Guidelines: A complete evaluation includes B-mode imaging, spectral Doppler, color Doppler, and power Doppler as needed of all accessible portions of each vessel. Bilateral testing is considered an integral part of a complete examination. Limited examinations for reoccurring indications may be performed as noted.  +---------+---------------+---------+-----------+----------+-------+ RIGHT    CompressibilityPhasicitySpontaneityPropertiesSummary +---------+---------------+---------+-----------+----------+-------+ CFV      Full           Yes      Yes                          +---------+---------------+---------+-----------+----------+-------+ SFJ      Full                                                 +---------+---------------+---------+-----------+----------+-------+ FV Prox  Full                                                  +---------+---------------+---------+-----------+----------+-------+ FV Mid   Full                                                 +---------+---------------+---------+-----------+----------+-------+ FV DistalFull                                                 +---------+---------------+---------+-----------+----------+-------+ PFV      Full                                                 +---------+---------------+---------+-----------+----------+-------+ POP      Full           Yes      Yes                          +---------+---------------+---------+-----------+----------+-------+ PTV      Full                                                 +---------+---------------+---------+-----------+----------+-------+  PERO     Full                                                 +---------+---------------+---------+-----------+----------+-------+   +---------+---------------+---------+-----------+----------+-------+ LEFT     CompressibilityPhasicitySpontaneityPropertiesSummary +---------+---------------+---------+-----------+----------+-------+ CFV      Full           Yes      Yes                          +---------+---------------+---------+-----------+----------+-------+ SFJ      Full                                                 +---------+---------------+---------+-----------+----------+-------+ FV Prox  Full                                                 +---------+---------------+---------+-----------+----------+-------+ FV Mid   Full                                                 +---------+---------------+---------+-----------+----------+-------+ FV DistalFull                                                 +---------+---------------+---------+-----------+----------+-------+ PFV      Full                                                  +---------+---------------+---------+-----------+----------+-------+ POP      Full           Yes      Yes                          +---------+---------------+---------+-----------+----------+-------+ PTV      Full                                                 +---------+---------------+---------+-----------+----------+-------+ PERO     Full                                                 +---------+---------------+---------+-----------+----------+-------+     Summary: Right: There is no evidence of deep vein thrombosis in the lower extremity. Left: There is no evidence of deep vein thrombosis in the lower extremity.  *See table(s) above for measurements and observations. Electronically signed by Ruta Hinds MD on 06/13/2019 at 5:15:12 PM.  Final      STUDIES:  [facility] 6/14 completed left occipital and posterior temporal lobe ischemic infarction  CULTURES: Blood 6/15 >>   ANTIBIOTICS: none  SIGNIFICANT EVENTS: 6/14 p/w aphasia, confusion, agitation 6/16>> Calm and appropaiate  LINES/TUBES: none  DISCUSSION: Improving neurologically, awake and alert Off Claviprex, but BP elevated Creatinine remains elevated at 2.24   ASSESSMENT / PLAN: 33 year old male history of type 1 diabetes who presented to the emergency department with complaints of speech difficulty, "stumbling into things" at home and headache.  Found to have a completed left occipital and posterior temporal lobe ischemic infarction.  Completed left occipital and temporal lobe stroke Extreme agitation, improved THC positive P:   RASS goal: -1, -2 to facilitate follow-up MRI once COVID testing is resulted SBP goal 150-160 per neuro Aspirin as ordered Echocardiogram pending MRI pending Speech therapy, PT/OT Case Management  At some risk for respiratory failure due to airway protection, currently adequate Protecting airway 6/16 Plan Follow for airway protection   At risk of evolving  neurological deficits Aggressive Pulmonary hygiene  Hypertension Plan BP remains elevated despite calm demeanor SBP goal is 150-160 Will add amlodipine 5 mg daily ( renal function with creatinine of 2.2) Will have prn labetalol to maintain BP < 175 systolic TSH reassuring, echocardiogram pending   Acute (? On chronic) kidney injury Creatinine 2.24 on 6/16 Unclear baseline.   Received single dose lisinopril early 6/15.   Stopped now given evolving renal insufficiency.   Follow urine output, trend BMP  Maintain renal perfusion with MAP goal > 65 Avoid nephrotoxic medications Most likely will need chronic BP management Replete electrolytes as needed    Diabetes mellitus type I Episode of hypoglycemia 6/15, 6/16 HGB A1C 8.2 on admission Home regimen not clear at this time, will need to speak with him about this when he is able to participate Per patient self dosing from [facility] Q 4 Sliding-scale insulin for now Continue dextrose in IVF until taking adequate PO Will need follow up for better management once d/c  Significant leukocytosis,  T Max 99.5 WBC 14.2 on 6/16 Plan suspect reactive in absence of any clear infectious source Follow clinically for any evidence of active infection.   Blood Cx pending At risk endocarditis as cause CVA>> Echo pending His COVID screening is pending; low risk patient CXR 6/17  Suspected dysphagia Passed swallow eval per bedside nursing 6/16 Plan Advance diet as tolerated HOB elevated while eating   FAMILY  - Updates: Per nursing   Independent CC time 32 minutes  [name], MSN, AGACNP-BC 06/14/2019, 8:54 AM [facility] [phone]  or if no answer [phone]

## 2019-06-14 NOTE — Progress Notes (Signed)
Called to patient bedside for HR of 168  regular rate    S: Patient is asymptomatic, but states he does feel his heart racing. No Chest pain or shortness of breath. He had been OOB to chair and ambulated back to bed.     O He is net + 2.4  L Cleviprex was restarted for BP that was uncontrolled with Labetalol and Hydralazine IV Potassium repleted this am was 3.6 with an additional 40 mEq given  Mag was  2.1 TSH was 1.476 on 06/12/2019 Pt has been drinking Diet Coke with caffeine  A:  Looks like SVT per tele BP stable No  CP  States he did feel a bit dizzy when HR was > 170 EKG  >> SVT with QTc of 513 ms, HR of 178  P EKG Will give Lopressor 5 mg IV now  Will add as a prn Q 6 hours Troponin Q 6 x 3 Consider Cards consult if does not resolve  HR down to 135 Feels better   [name], AGACNP-BC [place] Pager # [phone] After 4 pm call [phone]  06/14/2019 2:45 PM

## 2019-06-14 NOTE — Consult Note (Signed)
Cardiology Consultation:   Patient ID: George [name] MRN: [id number]; DOB: September 28, 1986  Admit date: 06/12/2019 Date of Consult: 06/14/2019  Primary Care Provider: Patient, No Pcp Per Primary Cardiologist: New Primary Electrophysiologist:  None    Patient Profile:   George [name] is a 33 y.o. male with a hx of DM1 who is being seen today for the evaluation of tachycardia and HTN at the request of Dr Lamonte Sakai.  History of Present Illness:   George [name] is a 33 yo male history of Dm1 admitted with slurred speech and balance issues at home along with headache. Found by neurology to have receptive aphasia and diagnosed with left occipital temoral lobse CVA, outside of tpa window  Later during admission became severely agitated, given several rounds of ativa, haldol. Significant HTN and tachycardia has been an ongoing issue even with resolution of agitation. Cardiology consulted to help manage. At time of my interview patient is sitting comfortable in no distress. No complaints of palpitations, no chest pain.    Admit labs K 3.2 Cr 2.23 BUN 14 EtOH negative Lactic acid 2.5 WBC 21.9 Hgb 17.1 Plt 335 TSH 1.47 Mg 1.9  COVID-19 neg Lactic acid 6.5 Hgb A1c 8.4 CRP 3.8 ESR 32  Trop 0.24--> UDS + THC CT head left PCA distribution hypoattenuation CXR no acute process MRI brain pending Echo pending EKG sinus tach, RAD.   Past Medical History:  Diagnosis Date   Type 1 diabetes ([place])     History reviewed. No pertinent surgical history.   Inpatient Medications: Scheduled Meds:  aspirin EC  325 mg Oral Daily   atorvastatin  40 mg Oral q1800   Chlorhexidine Gluconate Cloth  6 each Topical Daily   clopidogrel  75 mg Oral Daily   enoxaparin (LOVENOX) injection  40 mg Subcutaneous Q24H   insulin aspart  0-9 Units Subcutaneous Q4H   Continuous Infusions:  sodium chloride Stopped (06/13/19 1639)   clevidipine 8 mg/hr (06/14/19 1811)   dextrose 5 % and 0.9% NaCl Stopped  (06/14/19 1002)   PRN Meds: sodium chloride, acetaminophen **OR** acetaminophen (TYLENOL) oral liquid 160 mg/5 mL **OR** acetaminophen, fentaNYL (SUBLIMAZE) injection, haloperidol lactate, hydrALAZINE, labetalol, LORazepam, metoprolol tartrate, senna-docusate  Allergies:   No Known Allergies  Social History:   Social History   Socioeconomic History   Marital status: Single    Spouse name: Not on file   Number of children: Not on file   Years of education: Not on file   Highest education level: Not on file  Occupational History   Not on file  Social Needs   Financial resource strain: Not on file   Food insecurity    Worry: Not on file    Inability: Not on file   Transportation needs    Medical: Not on file    Non-medical: Not on file  Tobacco Use   Smoking status: Never Smoker   Smokeless tobacco: Never Used  Substance and Sexual Activity   Alcohol use: Yes    Comment: 6 beers/week   Drug use: Not Currently   Sexual activity: Not on file  Lifestyle   Physical activity    Days per week: Not on file    Minutes per session: Not on file   Stress: Not on file  Relationships   Social connections    Talks on phone: Not on file    Gets together: Not on file    Attends religious service: Not on file    Active member of club or organization:  Not on file    Attends meetings of clubs or organizations: Not on file    Relationship status: Not on file   Intimate partner violence    Fear of current or ex partner: Not on file    Emotionally abused: Not on file    Physically abused: Not on file    Forced sexual activity: Not on file  Other Topics Concern   Not on file  Social History Narrative   Not on file    Family History:   *History reviewed. No pertinent family history.   ROS:  Please see the history of present illness.  All other ROS reviewed and negative.     Physical Exam/Data:   Vitals:   06/14/19 1530 06/14/19 1545 06/14/19 1600 06/14/19  1615  BP: (!) 155/92 (!) 160/98 (!) 169/91 (!) 153/87  Pulse:    (!) 135  Resp: (!) 24 (!) 21 (!) 26 (!) 26  Temp:   98.5 F (36.9 C)   TempSrc:   Oral   SpO2:    100%  Weight:      Height:        Intake/Output Summary (Last 24 hours) at 06/14/2019 1838 Last data filed at 06/14/2019 1600 Gross per 24 hour  Intake 1122.68 ml  Output 1700 ml  Net -577.32 ml   Last 3 Weights 06/12/2019  Weight (lbs) 125 lb  Weight (kg) 56.7 kg     Body mass index is 20.8 kg/m.  General:  Well nourished, well developed, in no acute distress HEENT: normal Lymph: no adenopathy Neck: no JVD Endocrine:  No thryomegaly Vascular: No carotid bruits; FA pulses 2+ bilaterally without bruits  Cardiac:  Tachy, regular. No m/r/g. No jvd Lungs:  clear to auscultation bilaterally, no wheezing, rhonchi or rales  Abd: soft, nontender, no hepatomegaly  Ext: no edema Musculoskeletal:  No deformities, BUE and BLE strength normal and equal Skin: warm and dry  Neuro:  CNs 2-12 intact, no focal abnormalities noted Psych:  Normal affect     Laboratory Data:  Chemistry Recent Labs  Lab 06/12/19 2305 06/12/19 2316 06/13/19 0251 06/14/19 0218  NA 134* 135 134* 140  K 3.2* 3.2* 3.8 3.6  CL 98  --  101 111  CO2 23  --  16* 22  GLUCOSE 132*  --  341* 105*  BUN 14  --  15 12  CREATININE 2.23*  --  2.54* 2.24*  CALCIUM 9.6  --  8.5* 8.0*  GFRNONAA 38*  --  32* 37*  GFRAA 44*  --  37* 43*  ANIONGAP 13  --  17* 7    Recent Labs  Lab 06/12/19 2305  PROT 7.2  ALBUMIN 3.7  AST 22  ALT 16  ALKPHOS 128*  BILITOT 0.4   Hematology Recent Labs  Lab 06/12/19 2305 06/12/19 2316 06/13/19 0251 06/14/19 0218  WBC 21.9*  --  35.6* 14.2*  RBC 5.61  --  4.58 3.81*  HGB 17.1* 17.3* 14.2 11.5*  HCT 48.2 51.0 39.8 33.7*  MCV 85.9  --  86.9 88.5  MCH 30.5  --  31.0 30.2  MCHC 35.5  --  35.7 34.1  RDW 12.4  --  12.6 13.3  PLT 335  --  274 196   Cardiac Enzymes Recent Labs  Lab 06/14/19 1446    TROPONINI 0.24*   No results for input(s): TROPIPOC in the last 168 hours.  BNPNo results for input(s): BNP, PROBNP in the last 168 hours.  DDimer  No results for input(s): DDIMER in the last 168 hours.  Radiology/Studies:  Ct Head Wo Contrast  Result Date: 06/12/2019 CLINICAL DATA:  33 y/o M; altered level of consciousness. Expressive aphasia, type 1 diabetes. EXAM: CT HEAD WITHOUT CONTRAST TECHNIQUE: Contiguous axial images were obtained from the base of the skull through the vertex without intravenous contrast. COMPARISON:  None. FINDINGS: Brain: Hypoattenuation with loss of gray-white differentiation involving left occipital lobe and left medial temporal lobe. No hemorrhage, extra-axial collection, hydrocephalus, or herniation. Vascular: No hyperdense vessel or unexpected calcification. Skull: Normal. Negative for fracture or focal lesion. Sinuses/Orbits: No acute finding. Other: None. IMPRESSION: Large area of hypoattenuation in the left PCA distribution probably representing a late acute to early subacute infarction. No hemorrhage or gross mass effect. Differential of underlying neoplasm or cerebritis is less likely given typical vascular distribution. These results were called by telephone at the time of interpretation on 06/12/2019 at 10:08 pm to Dr. Charlesetta Shanks , who verbally acknowledged these results. Electronically Signed   By: Kristine Garbe M.D.   On: 06/12/2019 22:13   Dg Chest Port 1 View  Result Date: 06/13/2019 CLINICAL DATA:  Leukocytosis. EXAM: PORTABLE CHEST 1 VIEW COMPARISON:  None. FINDINGS: The heart size and mediastinal contours are within normal limits. Both lungs are clear. The visualized skeletal structures are unremarkable. IMPRESSION: No active disease. Electronically Signed   By: Earle Gell M.D.   On: 06/13/2019 09:10   Vas US Carotid  Result Date: 06/14/2019 Carotid Arterial Duplex Study Indications:       CVA, Speech disturbance and Gait disturbance.  Risk Factors:      Diabetes. Limitations:       Patient would not keep head turned. Comparison Study:  No prior study on file for comparison. Performing Technologist: Sharion Dove RVS  Examination Guidelines: A complete evaluation includes B-mode imaging, spectral Doppler, color Doppler, and power Doppler as needed of all accessible portions of each vessel. Bilateral testing is considered an integral part of a complete examination. Limited examinations for reoccurring indications may be performed as noted.  Right Carotid Findings: +----------+--------+--------+--------+--------+--------+             PSV cm/s EDV cm/s Stenosis Describe Comments  +----------+--------+--------+--------+--------+--------+  CCA Prox   156      12                                   +----------+--------+--------+--------+--------+--------+  CCA Distal 111      17                                   +----------+--------+--------+--------+--------+--------+  ICA Prox   92       12                                   +----------+--------+--------+--------+--------+--------+  ICA Mid                                        tortuous  +----------+--------+--------+--------+--------+--------+  ICA Distal 93       20  tortuous  +----------+--------+--------+--------+--------+--------+  ECA        74       9                          tortuous  +----------+--------+--------+--------+--------+--------+ +----------+--------+-------+--------+-------------------+             PSV cm/s EDV cms Describe Arm Pressure (mmHG)  +----------+--------+-------+--------+-------------------+  Subclavian 195                                            +----------+--------+-------+--------+-------------------+ +---------+--------+--+--------+-+  Vertebral PSV cm/s 45 EDV cm/s 8  +---------+--------+--+--------+-+  Left Carotid Findings: +----------+--------+--------+--------+--------+--------+             PSV cm/s EDV cm/s Stenosis Describe Comments   +----------+--------+--------+--------+--------+--------+  CCA Prox   171      27                                   +----------+--------+--------+--------+--------+--------+  CCA Distal 138      27                                   +----------+--------+--------+--------+--------+--------+ +----------+--------+--------+--------+-------------------+  Subclavian PSV cm/s EDV cm/s Describe Arm Pressure (mmHG)  +----------+--------+--------+--------+-------------------+             183                                             +----------+--------+--------+--------+-------------------+ +---------+--------+--+--------+--+  Vertebral PSV cm/s 73 EDV cm/s 12  +---------+--------+--+--------+--+  Summary: Right Carotid: The extracranial vessels were near-normal with only minimal wall                thickening or plaque. Highly tortuous ICA. Left Carotid: Unable to scan left ICA and ECA secondary to patient compliance. Vertebrals:  Bilateral vertebral arteries demonstrate antegrade flow. Subclavians: Normal flow hemodynamics were seen in bilateral subclavian              arteries. *See table(s) above for measurements and observations.  Electronically signed by Antony Contras MD on 06/14/2019 at 7:59:14 AM.    Final    Vas [place] Lower Extremity Venous (dvt)  Result Date: 06/13/2019  Lower Venous Study Indications: Stroke. Other Indications: Patient in 4 point restraints with feet tied together. Comparison Study: No prior study on file for comparison. Performing Technologist: Sharion Dove RVS  Examination Guidelines: A complete evaluation includes B-mode imaging, spectral Doppler, color Doppler, and power Doppler as needed of all accessible portions of each vessel. Bilateral testing is considered an integral part of a complete examination. Limited examinations for reoccurring indications may be performed as noted.  +---------+---------------+---------+-----------+----------+-------+  RIGHT      Compressibility Phasicity Spontaneity Properties Summary  +---------+---------------+---------+-----------+----------+-------+  CFV       Full            Yes       Yes                             +---------+---------------+---------+-----------+----------+-------+  SFJ  Full                                                      +---------+---------------+---------+-----------+----------+-------+  FV Prox   Full                                                      +---------+---------------+---------+-----------+----------+-------+  FV Mid    Full                                                      +---------+---------------+---------+-----------+----------+-------+  FV Distal Full                                                      +---------+---------------+---------+-----------+----------+-------+  PFV       Full                                                      +---------+---------------+---------+-----------+----------+-------+  POP       Full            Yes       Yes                             +---------+---------------+---------+-----------+----------+-------+  PTV       Full                                                      +---------+---------------+---------+-----------+----------+-------+  PERO      Full                                                      +---------+---------------+---------+-----------+----------+-------+   +---------+---------------+---------+-----------+----------+-------+  LEFT      Compressibility Phasicity Spontaneity Properties Summary  +---------+---------------+---------+-----------+----------+-------+  CFV       Full            Yes       Yes                             +---------+---------------+---------+-----------+----------+-------+  SFJ       Full                                                      +---------+---------------+---------+-----------+----------+-------+  FV Prox   Full                                                       +---------+---------------+---------+-----------+----------+-------+  FV Mid    Full                                                      +---------+---------------+---------+-----------+----------+-------+  FV Distal Full                                                      +---------+---------------+---------+-----------+----------+-------+  PFV       Full                                                      +---------+---------------+---------+-----------+----------+-------+  POP       Full            Yes       Yes                             +---------+---------------+---------+-----------+----------+-------+  PTV       Full                                                      +---------+---------------+---------+-----------+----------+-------+  PERO      Full                                                      +---------+---------------+---------+-----------+----------+-------+     Summary: Right: There is no evidence of deep vein thrombosis in the lower extremity. Left: There is no evidence of deep vein thrombosis in the lower extremity.  *See table(s) above for measurements and observations. Electronically signed by Ruta Hinds MD on 06/13/2019 at 5:15:12 PM.    Final     Assessment and Plan:   1. Severe tachycardia/severe HTN  - available EKG show sinus tach 120s-150s, as well as a narrow complex regular tach 170s - tele reviewed, severe narrow complex tach 170s that looks to be sinus tach. Following the rhythms you can see where sinus tach speeds up and slow downs, there is no abrupt onset as would be seen with an SVT.   - unclear etiology, perhaps sympathetic hyperactivity/autonomic instability after his CVA. His UDS was negative, he denies any EtOH use and has no other signs of EtOH withdrawal.  - no signicant anemia, normal TSH, not hypoxic, comfortable not in pain, not currently agitated. Nothing to support tachy from PE or  pericardial effusion. Essentially looks for some reason to have  autonomic/sympathetic surge.  - bp control currently with clevidipine per neuro as is preferred agent in stroke, looks to still be undergoing some permissive HTN and working not to lower bp too quickly. Still tachy 150s, in this setting would start esmolol drip to help rates with limited effect on bp's.  - once rates controlled check echo  2. HTN - post stroke, has been started on clevidpine drip (a dihydropiridine CCB )as is preferred agent in stroke. Undergoing permissive HTN, plans for gradual lowering of bp    2. Renal failure - cr up to 2.5, uknown baseline. Presented with Cr 2.2    3. Leukocytosis - severe elevated WBC potentially reactive from CVA up to 35, now trending down - initially significant lactic acidosis that has resolved - no current signs of infection   4. Elevated trop - in setting of severe HTN and tachycardia, looks to be demand ischemia. Do not suspect ACS   Unclear what ties together CVA in 33 year old with AKI, severe leukocytosis, initial lactic acidosis, severe sinus tachycardia and HTN. ESR/CRP are elevated, agree with vasculitis workup    For questions or updates, please contact [facility] Please consult [email or web address] for contact info under     Signed, Carlyle Dolly, MD  06/14/2019 6:38 PM

## 2019-06-14 NOTE — Evaluation (Signed)
Speech Language Pathology Evaluation Patient Details Name: George [name] MRN: [id number] DOB: [birth year] Today's Date: 06/14/2019 Time: 9675-9163 SLP Time Calculation (min) (ACUTE ONLY): 34 min  Problem List:  Patient Active Problem List   Diagnosis Date Noted  . Neurologic deficit due to acute ischemic cerebrovascular accident (CVA) ([place]) 06/13/2019  . Stroke (cerebrum) ([place]) 06/13/2019  . Cerebral infarction ([place])   . Leukocytosis   . Diabetes mellitus type I ([place])   . AKI (acute kidney injury) ([place])   . CKD (chronic kidney disease), stage III ([place])   . Agitation    Past Medical History:  Past Medical History:  Diagnosis Date  . Type 1 diabetes ([place])    Past Surgical History: History reviewed. No pertinent surgical history. HPI:  33 year old with history of diabetes presenting with left occipital, temporal lobe stroke.   Assessment / Plan / Recommendation Clinical Impression  Pt appears to have improved since initial presentation to the hospital, but continues to demonstrate higher-level cognitive-linguistic deficits. He has mild word-finding errors at the conversational level, with more substantial difficulty while trying to complete structured linguistic tasks. He produced two words in one minute on divergent naming task. When given a category of personal interest, he was able to generate a few more words, but had multiple repetitions without awareness. He had moderately impaired storage and retrieval of new information (recalled 2 out of 5 words given multiple choices cues) and has limited orientation to situation. Recommend additional SLP f/u acutely and post-discharge to maximize cognitive-linguistic skills and safety.    SLP Assessment  SLP Recommendation/Assessment: Patient needs continued Speech Lanaguage Pathology Services SLP Visit Diagnosis: Cognitive communication deficit (R41.841)    Follow Up Recommendations  Outpatient SLP;Home health SLP;24 hour  supervision/assistance    Frequency and Duration min 2x/week  2 weeks      SLP Evaluation Cognition  Overall Cognitive Status: Impaired/Different from baseline Arousal/Alertness: Awake/alert Orientation Level: Oriented to person;Oriented to time;Oriented to place;Disoriented to situation Attention: Selective Selective Attention: Impaired Selective Attention Impairment: Verbal complex Memory: Impaired Memory Impairment: Storage deficit;Retrieval deficit;Decreased recall of new information Awareness: Impaired Awareness Impairment: Anticipatory impairment;Emergent impairment       Comprehension  Auditory Comprehension Overall Auditory Comprehension: Appears within functional limits for tasks assessed    Expression Expression Primary Mode of Expression: Verbal Verbal Expression Overall Verbal Expression: Impaired Initiation: No impairment Level of Generative/Spontaneous Verbalization: Conversation Naming: Impairment Confrontation: Within functional limits Divergent: 50-74% accurate Verbal Errors: Other (comment)(anomia) Pragmatics: No impairment Non-Verbal Means of Communication: Not applicable   Oral / Motor  Motor Speech Overall Motor Speech: Appears within functional limits for tasks assessed   GO                    Venita Sheffield Kodee Drury 06/14/2019, 11:08 AM  Pollyann Glen, M.A. [facility] Acute Environmental education officer [phone] Office [phone]

## 2019-06-14 NOTE — Evaluation (Signed)
Physical Therapy Evaluation Patient Details Name: George [name] MRN: [id number] DOB: 04/03/1986 Today's Date: 06/14/2019   History of Present Illness  Patient is a 33 y/o male who presents with aphasia, difficulty "completing normal tasks," agitation and HA. Head CT- left PCA infarct. PMH includes type 1 diabetes.  Clinical Impression  Patient presents with impaired balance, impaired vision, decreased awareness of deficits/safety, impaired memory and impaired mobility s/p above. Pt independent PTA and lives with parents. Noted to have some apraxia, decreased attention, slower processing and word finding difficulties at times. Ambulation limited to within room due to elevated BP of 200s/100s. Recommend CIR as pt demonstrates higher level cognitive deficits impacting overall mobility and safety. Pt highly functioning PTA and not near baseline. Will follow acutely to maximize independence and mobility prior to d/c.     Follow Up Recommendations CIR;Supervision for mobility/OOB    Equipment Recommendations  None recommended by PT    Recommendations for Other Services Rehab consult     Precautions / Restrictions Precautions Precautions: Fall Precaution Comments: watch BP Restrictions Weight Bearing Restrictions: No      Mobility  Bed Mobility Overal bed mobility: Modified Independent             General bed mobility comments: Able to get to EOB without assist. No dizziness.  Transfers Overall transfer level: Needs assistance Equipment used: None Transfers: Sit to/from Stand Sit to Stand: Min guard         General transfer comment: Min guard for safety. Slightly impulsive with movements. Stood from [employer], transferred to chair.  Ambulation/Gait Ambulation/Gait assistance: Min guard Gait Distance (Feet): 30 Feet Assistive device: None Gait Pattern/deviations: Step-through pattern Gait velocity: decreased   General Gait Details: Slow, guarded gait holding onto  furniture in room. Seemed to have issues with navigating around lines etc. Possible visual deficits vs cognition? Limited to within room ambulation due to elevated BP.  Stairs            Wheelchair Mobility    Modified Rankin (Stroke Patients Only) Modified Rankin (Stroke Patients Only) Pre-Morbid Rankin Score: No symptoms Modified Rankin: Moderately severe disability     Balance Overall balance assessment: Mild deficits observed, not formally tested                                           Pertinent Vitals/Pain Pain Assessment: No/denies pain Faces Pain Scale: No hurt    Home Living Family/patient expects to be discharged to:: Private residence Living Arrangements: Parent Available Help at Discharge: Family;Available PRN/intermittently Type of Home: House Home Access: Stairs to enter Entrance Stairs-Rails: None Entrance Stairs-Number of Steps: 4 Home Layout: One level Home Equipment: None      Prior Function Level of Independence: Independent         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     Hand Dominance   Dominant Hand: Right    Extremity/Trunk Assessment   Upper Extremity Assessment Upper Extremity Assessment: Defer to OT evaluation    Lower Extremity Assessment Lower Extremity Assessment: Overall WFL for tasks assessed(Grossly ~5/5 and sensation WFLs.)    Cervical / Trunk Assessment Cervical / Trunk Assessment: Normal  Communication   Communication: Expressive difficulties;Receptive difficulties  Cognition Arousal/Alertness: Awake/alert Behavior During Therapy: WFL for tasks assessed/performed Overall Cognitive Status: Impaired/Different from baseline Area of Impairment: Attention  Current Attention Level: Sustained Memory: Decreased short-term memory   Safety/Judgement: Decreased awareness of deficits Awareness: Emergent("I am good, just having trouble with thinking.") Problem  Solving: Slow processing;Requires verbal cues General Comments: A&Ox4, higher level cognitive deficits noted. Not able to name 3/3 words after 5 mins. Able to recall 2/3 with options. Difficulty drawing clock and writing correct time. Some apraxia noted. Not sure what to do with socks, "what do I do with these?" Difficulty figuring out how to use pen at first.      General Comments General comments (skin integrity, edema, etc.): BP high in 200s/100s. Rn aware and attempting to bring it down with IV meds. Of note, pt with glasses at baseline and called family to bring them. Able to track in all directions without difficulty. Seemed to have some issues with right upper quadrant vs attention issues during testing.    Exercises     Assessment/Plan    PT Assessment Patient needs continued PT services  PT Problem List Decreased mobility;Decreased safety awareness;Decreased cognition;Decreased balance       PT Treatment Interventions Therapeutic activities;Cognitive remediation;Gait training;Therapeutic exercise;Stair training;Balance training;Functional mobility training;Neuromuscular re-education;Patient/family education    PT Goals (Current goals can be found in the Care Plan section)  Acute Rehab PT Goals Patient Stated Goal: to get better PT Goal Formulation: With patient Time For Goal Achievement: 06/28/19 Potential to Achieve Goals: Good    Frequency Min 4X/week   Barriers to discharge        Co-evaluation PT/OT/SLP Co-Evaluation/Treatment: Yes Reason for Co-Treatment: Necessary to address cognition/behavior during functional activity PT goals addressed during session: Mobility/safety with mobility;Balance         AM-PAC PT "6 Clicks" Mobility  Outcome Measure Help needed turning from your back to your side while in a flat bed without using bedrails?: None Help needed moving from lying on your back to sitting on the side of a flat bed without using bedrails?: None Help  needed moving to and from a bed to a chair (including a wheelchair)?: A Little Help needed standing up from a chair using your arms (e.g., wheelchair or bedside chair)?: A Little Help needed to walk in hospital room?: A Little Help needed climbing 3-5 steps with a railing? : A Little 6 Click Score: 20    End of Session Equipment Utilized During Treatment: Gait belt Activity Tolerance: Patient tolerated treatment well Patient left: in chair;with call bell/phone within reach Nurse Communication: Mobility status PT Visit Diagnosis: Difficulty in walking, not elsewhere classified (R26.2)    Time: 1050-1120 PT Time Calculation (min) (ACUTE ONLY): 30 min   Charges:   PT Evaluation $PT Eval Moderate Complexity: 1 Mod          Wray Kearns, PT, DPT Acute Rehabilitation Services Pager [phone] Office [phone]      [place] 06/14/2019, 12:52 PM

## 2019-06-14 NOTE — Progress Notes (Signed)
Rehab Admissions Coordinator Note:  Patient was screened by Cleatrice Burke for appropriateness for an Inpatient Acute Rehab Consult per PT recs.  At this time, we are recommending Inpatient Rehab consult. Please place order.  Cleatrice Burke RN MSN 06/14/2019, 12:57 PM  I can be reached at [phone].

## 2019-06-14 NOTE — Progress Notes (Signed)
STROKE TEAM PROGRESS NOTE   INTERVAL HISTORY RN at bedside. He is much improved from yesterday and currently mental status back to baseline. Fluent language and orientated. Still has episodic hypoglycemia. Right visual field no heminopia but decreased visual acuity. Passed swallow.    Vitals:   06/14/19 0600 06/14/19 0700 06/14/19 0800 06/14/19 0900  BP: (!) 155/96 (!) 168/100 (!) 186/107 (!) 188/109  Pulse: 75 88 68 88  Resp: 20 20 19  (!) 24  Temp:   98.2 F (36.8 C)   TempSrc:   Oral   SpO2: 98% 98% 97% 98%  Weight:      Height:        CBC:  Recent Labs  Lab 06/12/19 2305  06/13/19 0251 06/14/19 0218  WBC 21.9*  --  35.6* 14.2*  NEUTROABS 19.1*  --   --   --   HGB 17.1*   < > 14.2 11.5*  HCT 48.2   < > 39.8 33.7*  MCV 85.9  --  86.9 88.5  PLT 335  --  274 196   < > = values in this interval not displayed.    Basic Metabolic Panel:  Recent Labs  Lab 06/12/19 2305  06/13/19 0251 06/14/19 0218  NA 134*   < > 134* 140  K 3.2*   < > 3.8 3.6  CL 98  --  101 111  CO2 23  --  16* 22  GLUCOSE 132*  --  341* 105*  BUN 14  --  15 12  CREATININE 2.23*  --  2.54* 2.24*  CALCIUM 9.6  --  8.5* 8.0*  MG 1.9  --   --  2.1  PHOS 2.4*  --   --  3.7   < > = values in this interval not displayed.   Lipid Panel:     Component Value Date/Time   CHOL 215 (H) 06/13/2019 0251   TRIG 298 (H) 06/13/2019 0251   HDL 53 06/13/2019 0251   CHOLHDL 4.1 06/13/2019 0251   VLDL 60 (H) 06/13/2019 0251   LDLCALC 102 (H) 06/13/2019 0251   HgbA1c:  Lab Results  Component Value Date   HGBA1C 8.4 (H) 06/13/2019   Urine Drug Screen:     Component Value Date/Time   LABOPIA NONE DETECTED 06/13/2019 0423   COCAINSCRNUR NONE DETECTED 06/13/2019 0423   LABBENZ NONE DETECTED 06/13/2019 0423   AMPHETMU NONE DETECTED 06/13/2019 0423   THCU POSITIVE (A) 06/13/2019 0423   LABBARB NONE DETECTED 06/13/2019 0423    Alcohol Level     Component Value Date/Time   ETH <10 06/12/2019 2305     IMAGING Ct Head Wo Contrast  Result Date: 06/12/2019 CLINICAL DATA:  33 y/o M; altered level of consciousness. Expressive aphasia, type 1 diabetes. EXAM: CT HEAD WITHOUT CONTRAST TECHNIQUE: Contiguous axial images were obtained from the base of the skull through the vertex without intravenous contrast. COMPARISON:  None. FINDINGS: Brain: Hypoattenuation with loss of gray-white differentiation involving left occipital lobe and left medial temporal lobe. No hemorrhage, extra-axial collection, hydrocephalus, or herniation. Vascular: No hyperdense vessel or unexpected calcification. Skull: Normal. Negative for fracture or focal lesion. Sinuses/Orbits: No acute finding. Other: None. IMPRESSION: Large area of hypoattenuation in the left PCA distribution probably representing a late acute to early subacute infarction. No hemorrhage or gross mass effect. Differential of underlying neoplasm or cerebritis is less likely given typical vascular distribution. These results were called by telephone at the time of interpretation on 06/12/2019 at 10:08  pm to Dr. Charlesetta Shanks , who verbally acknowledged these results. Electronically Signed   By: Kristine Garbe M.D.   On: 06/12/2019 22:13   Dg Chest Port 1 View  Result Date: 06/13/2019 CLINICAL DATA:  Leukocytosis. EXAM: PORTABLE CHEST 1 VIEW COMPARISON:  None. FINDINGS: The heart size and mediastinal contours are within normal limits. Both lungs are clear. The visualized skeletal structures are unremarkable. IMPRESSION: No active disease. Electronically Signed   By: Earle Gell M.D.   On: 06/13/2019 09:10   Vas US Carotid  Result Date: 06/14/2019 Carotid Arterial Duplex Study Indications:       CVA, Speech disturbance and Gait disturbance. Risk Factors:      Diabetes. Limitations:       Patient would not keep head turned. Comparison Study:  No prior study on file for comparison. Performing Technologist: Sharion Dove RVS  Examination Guidelines: A  complete evaluation includes B-mode imaging, spectral Doppler, color Doppler, and power Doppler as needed of all accessible portions of each vessel. Bilateral testing is considered an integral part of a complete examination. Limited examinations for reoccurring indications may be performed as noted.  Right Carotid Findings: +----------+--------+--------+--------+--------+--------+           PSV cm/sEDV cm/sStenosisDescribeComments +----------+--------+--------+--------+--------+--------+ CCA Prox  156     12                               +----------+--------+--------+--------+--------+--------+ CCA Distal111     17                               +----------+--------+--------+--------+--------+--------+ ICA Prox  92      12                               +----------+--------+--------+--------+--------+--------+ ICA Mid                                   tortuous +----------+--------+--------+--------+--------+--------+ ICA Distal93      20                      tortuous +----------+--------+--------+--------+--------+--------+ ECA       74      9                       tortuous +----------+--------+--------+--------+--------+--------+ +----------+--------+-------+--------+-------------------+           PSV cm/sEDV cmsDescribeArm Pressure (mmHG) +----------+--------+-------+--------+-------------------+ [id number]                                        +----------+--------+-------+--------+-------------------+ +---------+--------+--+--------+-+ VertebralPSV cm/s45EDV cm/s8 +---------+--------+--+--------+-+  Left Carotid Findings: +----------+--------+--------+--------+--------+--------+           PSV cm/sEDV cm/sStenosisDescribeComments +----------+--------+--------+--------+--------+--------+ CCA Prox  171     27                               +----------+--------+--------+--------+--------+--------+ CCA Distal138     27                                +----------+--------+--------+--------+--------+--------+ +----------+--------+--------+--------+-------------------+  SubclavianPSV cm/sEDV cm/sDescribeArm Pressure (mmHG) +----------+--------+--------+--------+-------------------+           183                                         +----------+--------+--------+--------+-------------------+ +---------+--------+--+--------+--+ VertebralPSV cm/s73EDV cm/s12 +---------+--------+--+--------+--+  Summary: Right Carotid: The extracranial vessels were near-normal with only minimal wall                thickening or plaque. Highly tortuous ICA. Left Carotid: Unable to scan left ICA and ECA secondary to patient compliance. Vertebrals:  Bilateral vertebral arteries demonstrate antegrade flow. Subclavians: Normal flow hemodynamics were seen in bilateral subclavian              arteries. *See table(s) above for measurements and observations.  Electronically signed by Antony Contras MD on 06/14/2019 at 7:59:14 AM.    Final    Vas [place] Lower Extremity Venous (dvt)  Result Date: 06/13/2019  Lower Venous Study Indications: Stroke. Other Indications: Patient in 4 point restraints with feet tied together. Comparison Study: No prior study on file for comparison. Performing Technologist: Sharion Dove RVS  Examination Guidelines: A complete evaluation includes B-mode imaging, spectral Doppler, color Doppler, and power Doppler as needed of all accessible portions of each vessel. Bilateral testing is considered an integral part of a complete examination. Limited examinations for reoccurring indications may be performed as noted.  +---------+---------------+---------+-----------+----------+-------+ RIGHT    CompressibilityPhasicitySpontaneityPropertiesSummary +---------+---------------+---------+-----------+----------+-------+ CFV      Full           Yes      Yes                           +---------+---------------+---------+-----------+----------+-------+ SFJ      Full                                                 +---------+---------------+---------+-----------+----------+-------+ FV Prox  Full                                                 +---------+---------------+---------+-----------+----------+-------+ FV Mid   Full                                                 +---------+---------------+---------+-----------+----------+-------+ FV DistalFull                                                 +---------+---------------+---------+-----------+----------+-------+ PFV      Full                                                 +---------+---------------+---------+-----------+----------+-------+ POP      Full  Yes      Yes                          +---------+---------------+---------+-----------+----------+-------+ PTV      Full                                                 +---------+---------------+---------+-----------+----------+-------+ PERO     Full                                                 +---------+---------------+---------+-----------+----------+-------+   +---------+---------------+---------+-----------+----------+-------+ LEFT     CompressibilityPhasicitySpontaneityPropertiesSummary +---------+---------------+---------+-----------+----------+-------+ CFV      Full           Yes      Yes                          +---------+---------------+---------+-----------+----------+-------+ SFJ      Full                                                 +---------+---------------+---------+-----------+----------+-------+ FV Prox  Full                                                 +---------+---------------+---------+-----------+----------+-------+ FV Mid   Full                                                 +---------+---------------+---------+-----------+----------+-------+ FV DistalFull                                                  +---------+---------------+---------+-----------+----------+-------+ PFV      Full                                                 +---------+---------------+---------+-----------+----------+-------+ POP      Full           Yes      Yes                          +---------+---------------+---------+-----------+----------+-------+ PTV      Full                                                 +---------+---------------+---------+-----------+----------+-------+ PERO     Full                                                 +---------+---------------+---------+-----------+----------+-------+  Summary: Right: There is no evidence of deep vein thrombosis in the lower extremity. Left: There is no evidence of deep vein thrombosis in the lower extremity.  *See table(s) above for measurements and observations. Electronically signed by Ruta Hinds MD on 06/13/2019 at 5:15:12 PM.    Final     PHYSICAL EXAM  Temp:  [98.2 F (36.8 C)-99.5 F (37.5 C)] 98.2 F (36.8 C) (06/16 0800) Pulse Rate:  [68-151] 88 (06/16 0900) Resp:  [16-30] 24 (06/16 0900) BP: (143-188)/(92-112) 188/109 (06/16 0900) SpO2:  [95 %-99 %] 98 % (06/16 0900)  General - Well nourished, well developed, not in acute distress  Ophthalmologic - fundi not visualized due to noncooperation.  Cardiovascular - Regular rhythm, but mild tachycardia.  Mental Status -  Level of arousal and orientation to time, place, and person were intact. Language including expression, naming, repetition, comprehension was assessed and found intact. Fund of Knowledge was assessed and was intact.  Cranial Nerves II - XII - II - Visual field intact OU, no hemianopia but right visual field visual acuity decreased. III, IV, VI - Extraocular movements intact. V - Facial sensation intact bilaterally. VII - Facial movement intact bilaterally. VIII - Hearing & vestibular intact  bilaterally. X - Palate elevates symmetrically. XI - Chin turning & shoulder shrug intact bilaterally. XII - Tongue protrusion intact.  Motor Strength - The patient's strength was normal in all extremities and pronator drift was absent.  Bulk was normal and fasciculations were absent.   Motor Tone - Muscle tone was assessed at the neck and appendages and was normal.  Reflexes - The patient's reflexes were symmetrical in all extremities and he had no pathological reflexes.  Sensory - Light touch, temperature/pinprick were assessed and were symmetrical.    Coordination - The patient had normal movements in the hands and feet with no ataxia or dysmetria.  Tremor was absent.  Gait and Station - deferred.   ASSESSMENT/PLAN [name] is a 33 y.o. male with history of type I DB presenting with receptive aphasia and word salad responses, not oriented with HA.   Stroke: Large L PCA infarct, suspect embolic source, workup underway  CT head 6/14 2152 large L PCA infarct  MRI  pending  MRA  pending  Carotid Doppler unremarkable  2D Echo pending  LE venous dopplers no DVT  Will do TEE given severe leukocytosis and stroke in young age   hypercoagulable/vasculitis pending   LDL 102  HgbA1c 8.4  Lovenox 40 mg sq daily for VTE prophylaxis  No antithrombotic prior to admission, now on aspirin 325 mg and plavix daily.  Therapy recommendations:  pending   Disposition:  pending   Extreme Agitation, resolved  Required mult meds for sedation including precedex  Restrained, now off  CCM managing  Off precedex  Hypertension/Tachycardia  No hx HTN. Not on home meds  BP still on the high end  Mild tachycardia  Put on labetalol PRN  On amlodipine 5mg   Gradually normalize BP in 2-3 days  ?? AKI on CKD  No previous Cre to compare  Cr 2.23->2.54->2.24  Urine protein large  May consider renal ultrasound for further evaluation  Encourage po  intake  Hyperlipidemia  Home meds:  No statin  LDL 102, goal < 70  On lipitor 40  Continue statin at discharge  Diabetes type I Uncontrolled Hypoglycemic episode  HgbA1c 8.4, goal < 7.0  CBGs  SSI  On Levemir  On diet  CCM on board  Leukocytosis  WBC 21.9->35.6->14.2  Likely reactive  Lactic acid 2.5-> 6.5->pending  CXR no active disease  UA negative  Blood culture pending  Afebrile  Other Stroke Risk Factors  ETOH use, advised to drink no more than 2 drink(s) a day  THC use - UDS positive for THC this admission   Other [facility] day # 1  This patient is critically ill due to left PCA stroke, extreme agitation, DM type I, severe leukocytosis, AK on CKD and at significant risk of neurological worsening, death form recurrent stroke, hemorrhagic conversion, DKA, sepsis, renal failure. This patient's care requires constant monitoring of vital signs, hemodynamics, respiratory and cardiac monitoring, review of multiple databases, neurological assessment, discussion with family, other specialists and medical decision making of high complexity. I spent 35 minutes of neurocritical care time in the care of this patient. I have discussed with CCM NP  Rosalin Hawking, MD PhD Stroke Neurology 06/14/2019 10:02 AM   To contact Stroke Continuity provider, please refer to [email or web address] After hours, contact General Neurology

## 2019-06-14 NOTE — Progress Notes (Signed)
MRI called. Spoke with technician and updated on patient status. Will plan to scan later today.

## 2019-06-14 NOTE — Progress Notes (Signed)
Patient's mental status improved overnight. Able to wean off precedex and remove bilateral wrist restraints. Patient alert and oriented x3-4. Slight expressive aphasia. VSS. Plan for MRI today.

## 2019-06-14 NOTE — Evaluation (Addendum)
Occupational Therapy Evaluation Patient Details Name: George [name] MRN: [id number] DOB: 1986/03/18 Today's Date: 06/14/2019    History of Present Illness Patient is a 33 y/o male who presents with aphasia, difficulty "completing normal tasks," agitation and HA. Head CT- left PCA infarct. PMH includes type 1 diabetes.   Clinical Impression   PTA, pt was living with his parents and was independent. Pt currently requiring Min Guard-Min A for ADLs in standing and functional mobility due to decreased safety and balance. Pt presenting with poor awareness, problem solving, attention, ST memory, and balance. Pt also presenting with right side visual deficits. Pt will require further acute OT to facilitate safe dc. Recommend dc to CIR for intensive OT to optimize safety, increase independence with ADLs/IADLs, and facilitate return to PLOF.     Follow Up Recommendations  CIR;Supervision/Assistance - 24 hour    Equipment Recommendations  Other (comment)(Defer to next venue)    Recommendations for Other Services Rehab consult;Speech consult;PT consult     Precautions / Restrictions Precautions Precautions: Fall Precaution Comments: watch BP Restrictions Weight Bearing Restrictions: No      Mobility Bed Mobility Overal bed mobility: Modified Independent             General bed mobility comments: Able to get to EOB without assist. No dizziness.  Transfers Overall transfer level: Needs assistance Equipment used: None Transfers: Sit to/from Stand Sit to Stand: Min guard         General transfer comment: Min guard for safety. Slightly impulsive with movements. Stood from [employer], transferred to chair.    Balance Overall balance assessment: Mild deficits observed, not formally tested                                         ADL either performed or assessed with clinical judgement   ADL Overall ADL's : Needs assistance/impaired Eating/Feeding: Set  up;Supervision/ safety;Sitting   Grooming: Minimal assistance   Upper Body Bathing: Set up;Supervision/ safety;Sitting   Lower Body Bathing: Minimal assistance;Sit to/from stand   Upper Body Dressing : Set up;Supervision/safety;Cueing for sequencing;Sitting   Lower Body Dressing: Min guard;Bed level Lower Body Dressing Details (indicate cue type and reason): Long sitting in bed. when handing pt sock he stated "what do I do with these?" Cued for appropiate sequencing and problem solving Toilet Transfer: Min guard;Ambulation(simulated to recliner)   Toileting- Clothing Manipulation and Hygiene: Min guard Toileting - Clothing Manipulation Details (indicate cue type and reason): Min Guard for safety in standing at toilet during urination     Functional mobility during ADLs: Min guard;Minimal assistance General ADL Comments: Pt presenting with poor cognition, safety, awareness, and balance.     Vision Baseline Vision/History: Wears glasses Wears Glasses: At all times Patient Visual Report: Blurring of vision Vision Assessment?: Yes Eye Alignment: Within Functional Limits Ocular Range of Motion: Within Functional Limits Tracking/Visual Pursuits: Able to track stimulus in all quads without difficulty Visual Fields: Right superior homonymous quadranopsia Additional Comments: Will further assess. Pt with decreased recognition of items coming from left superior area. Also presenting with poor visual attention and requiring frequent cues     Perception     Praxis      Pertinent Vitals/Pain Pain Assessment: No/denies pain     Hand Dominance Right   Extremity/Trunk Assessment Upper Extremity Assessment Upper Extremity Assessment: Generalized weakness(Pt with decreased following of instructions and required cue)  Lower Extremity Assessment Lower Extremity Assessment: Defer to PT evaluation   Cervical / Trunk Assessment Cervical / Trunk Assessment: Normal   Communication  Communication Communication: Expressive difficulties;Receptive difficulties   Cognition Arousal/Alertness: Awake/alert Behavior During Therapy: WFL for tasks assessed/performed Overall Cognitive Status: Impaired/Different from baseline Area of Impairment: Attention                   Current Attention Level: Sustained Memory: Decreased short-term memory   Safety/Judgement: Decreased awareness of deficits Awareness: Emergent("I am good, just having trouble with thinking.") Problem Solving: Slow processing;Requires verbal cues General Comments: A&Ox4, higher level cognitive deficits noted. Not able to name 3/3 words after 5 mins. Able to recall 2/3 with mutiple choice. Able to draw a clock but incorrectly placing time on the clock (requested to draw 2:35 and repeated to draw 2:30 with incorrect length of hands). Pt repeating "I don't know how to flush this toilet" after urination; requiring significant time and Mod VCs to locate toilet handle. Some apraxia noted. Not sure what to do with socks, "what do I do with these?" Difficulty figuring out how to use pen at first.   General Comments  BP high in 200s/100s. Rn aware and attempting to bring it down with IV meds. Of note, pt with glasses at baseline and called family to bring them.     Exercises     Shoulder Instructions      Home Living Family/patient expects to be discharged to:: Private residence Living Arrangements: Parent Available Help at Discharge: Family;Available PRN/intermittently Type of Home: House Home Access: Stairs to enter [employer] of Steps: 4 Entrance Stairs-Rails: None Home Layout: One level     Bathroom Shower/Tub: Teacher, early years/pre: Standard     Home Equipment: None          Prior Functioning/Environment Level of Independence: Independent        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        OT Problem List: Decreased range of motion;Decreased  activity tolerance      OT Treatment/Interventions: Self-care/ADL training;Therapeutic exercise;Energy conservation;DME and/or AE instruction;Therapeutic activities;Cognitive remediation/compensation;Patient/family education;Visual/perceptual remediation/compensation    OT Goals(Current goals can be found in the care plan section) Acute Rehab OT Goals Patient Stated Goal: to get better OT Goal Formulation: With patient Time For Goal Achievement: 06/28/19 Potential to Achieve Goals: Good  OT Frequency: Min 3X/week   Barriers to D/C:            Co-evaluation PT/OT/SLP Co-Evaluation/Treatment: Yes Reason for Co-Treatment: Complexity of the patient's impairments (multi-system involvement);For patient/therapist safety;To address functional/ADL transfers   OT goals addressed during session: ADL's and self-care      AM-PAC OT "6 Clicks" Daily Activity     Outcome Measure Help from another person eating meals?: None Help from another person taking care of personal grooming?: A Little Help from another person toileting, which includes using toliet, bedpan, or urinal?: A Little Help from another person bathing (including washing, rinsing, drying)?: A Little Help from another person to put on and taking off regular upper body clothing?: A Little Help from another person to put on and taking off regular lower body clothing?: A Little 6 Click Score: 19   End of Session Equipment Utilized During Treatment: Gait belt Nurse Communication: Mobility status;Other (comment)(Call parents for glasses)  Activity Tolerance: Patient tolerated treatment well Patient left: in chair;with call bell/phone within reach;with nursing/sitter in room  OT Visit Diagnosis: Unsteadiness on feet (R26.81);Other  abnormalities of gait and mobility (R26.89);Muscle weakness (generalized) (M62.81);Other symptoms and signs involving cognitive function                Time: 2370-2301 OT Time Calculation (min): 35  min Charges:  OT General Charges $OT Visit: 1 Visit OT Evaluation $OT Eval Moderate Complexity: [place], OTR/L Acute Rehab Pager: [phone] Office: [place] 06/14/2019, 4:50 PM

## 2019-06-15 ENCOUNTER — Inpatient Hospital Stay (HOSPITAL_COMMUNITY): Payer: Medicaid Other

## 2019-06-15 DIAGNOSIS — I340 Nonrheumatic mitral (valve) insufficiency: Secondary | ICD-10-CM

## 2019-06-15 DIAGNOSIS — I10 Essential (primary) hypertension: Secondary | ICD-10-CM

## 2019-06-15 DIAGNOSIS — I471 Supraventricular tachycardia: Secondary | ICD-10-CM

## 2019-06-15 DIAGNOSIS — N183 Chronic kidney disease, stage 3 (moderate): Secondary | ICD-10-CM

## 2019-06-15 LAB — PHOSPHORUS: Phosphorus: 4.4 mg/dL (ref 2.5–4.6)

## 2019-06-15 LAB — GLUCOSE, CAPILLARY
Glucose-Capillary: 147 mg/dL — ABNORMAL HIGH (ref 70–99)
Glucose-Capillary: 238 mg/dL — ABNORMAL HIGH (ref 70–99)
Glucose-Capillary: 239 mg/dL — ABNORMAL HIGH (ref 70–99)
Glucose-Capillary: 267 mg/dL — ABNORMAL HIGH (ref 70–99)
Glucose-Capillary: 310 mg/dL — ABNORMAL HIGH (ref 70–99)
Glucose-Capillary: 320 mg/dL — ABNORMAL HIGH (ref 70–99)

## 2019-06-15 LAB — BASIC METABOLIC PANEL
Anion gap: 8 (ref 5–15)
BUN: 17 mg/dL (ref 6–20)
CO2: 19 mmol/L — ABNORMAL LOW (ref 22–32)
Calcium: 8.6 mg/dL — ABNORMAL LOW (ref 8.9–10.3)
Chloride: 116 mmol/L — ABNORMAL HIGH (ref 98–111)
Creatinine, Ser: 2.26 mg/dL — ABNORMAL HIGH (ref 0.61–1.24)
GFR calc Af Amer: 43 mL/min — ABNORMAL LOW (ref >60)
GFR calc non Af Amer: 37 mL/min — ABNORMAL LOW (ref >60)
Glucose, Bld: 66 mg/dL — ABNORMAL LOW (ref 70–99)
Potassium: 3.7 mmol/L (ref 3.5–5.1)
Sodium: 143 mmol/L (ref 135–145)

## 2019-06-15 LAB — HOMOCYSTEINE: Homocysteine: 15.1 umol/L — ABNORMAL HIGH (ref 0.0–14.5)

## 2019-06-15 LAB — ECHOCARDIOGRAM COMPLETE
Height: 65 in
Weight: 2000 oz

## 2019-06-15 LAB — CBC
HCT: 40.8 % (ref 39.0–52.0)
Hemoglobin: 14.1 g/dL (ref 13.0–17.0)
MCH: 30.6 pg (ref 26.0–34.0)
MCHC: 34.6 g/dL (ref 30.0–36.0)
MCV: 88.5 fL (ref 80.0–100.0)
Platelets: 257 10*3/uL (ref 150–400)
RBC: 4.61 MIL/uL (ref 4.22–5.81)
RDW: 13.4 % (ref 11.5–15.5)
WBC: 17.1 10*3/uL — ABNORMAL HIGH (ref 4.0–10.5)
nRBC: 0 % (ref 0.0–0.2)

## 2019-06-15 LAB — BETA-2-GLYCOPROTEIN I ABS, IGG/M/A
Beta-2 Glyco I IgG: <9 GPI IgG units (ref 0–20)
Beta-2-Glycoprotein I IgA: <9 GPI IgA units (ref 0–25)
Beta-2-Glycoprotein I IgM: <9 GPI IgM units (ref 0–32)

## 2019-06-15 LAB — MAGNESIUM: Magnesium: 2 mg/dL (ref 1.7–2.4)

## 2019-06-15 LAB — CARDIOLIPIN ANTIBODIES, IGG, IGM, IGA
Anticardiolipin IgA: <9 APL U/mL (ref 0–11)
Anticardiolipin IgG: <9 GPL U/mL (ref 0–14)
Anticardiolipin IgM: <9 MPL U/mL (ref 0–12)

## 2019-06-15 LAB — TROPONIN I: Troponin I: 0.34 ng/mL (ref <0.03)

## 2019-06-15 LAB — ANTINUCLEAR ANTIBODIES, IFA: ANA Ab, IFA: NEGATIVE

## 2019-06-15 MED ORDER — HYDRALAZINE HCL 20 MG/ML IJ SOLN
10.0000 mg | INTRAMUSCULAR | Status: DC | PRN
  Administered 2019-06-15 – 2019-06-16 (×5): 10 mg via INTRAVENOUS
  Filled 2019-06-15 (×5): qty 1

## 2019-06-15 MED ORDER — AMLODIPINE BESYLATE 10 MG PO TABS
10.0000 mg | ORAL_TABLET | Freq: Every day | ORAL | Status: DC
  Administered 2019-06-15 – 2019-06-20 (×6): 10 mg via ORAL
  Filled 2019-06-15 (×6): qty 1

## 2019-06-15 MED ORDER — METOPROLOL TARTRATE 25 MG PO TABS
25.0000 mg | ORAL_TABLET | Freq: Two times a day (BID) | ORAL | Status: DC
  Administered 2019-06-15 – 2019-06-16 (×3): 25 mg via ORAL
  Filled 2019-06-15 (×3): qty 1

## 2019-06-15 NOTE — Progress Notes (Signed)
NAMESaman [name], MRN:  [id number], DOB:  1986/11/15, LOS: 2 ADMISSION DATE:  06/12/2019, CONSULTATION DATE: 06/12/2019 REFERRING MD:  Dr. Johnney Killian, ER, CHIEF COMPLAINT:  Headache   Brief History   33 yo male presented to ER with speech difficulty with receptive aphasia, headache, and unsteady gait.  Found to have Large Lt PCA infarct likely from embolic source.  UDS positive for THC.  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  Objective   Blood pressure (!) 160/97, pulse (!) 103, temperature 98.3 F (36.8 C), temperature source Oral, resp. rate (!) 24, height 5\' 5"  (1.651 m), weight 56.7 kg, SpO2 100 %.        Intake/Output Summary (Last 24 hours) at 06/15/2019 0825 Last data filed at 06/15/2019 0800 Gross per 24 hour  Intake 687.22 ml  Output 1950 ml  Net -1262.78 ml   Filed Weights   06/12/19 2128  Weight: 56.7 kg    Examination:  General - alert Eyes - pupils reactive ENT - no sinus tenderness, no stridor Cardiac - regular rate/rhythm, no murmur Chest - equal breath sounds b/l, no wheezing or rales Abdomen - soft, non tender, + bowel sounds Extremities - no cyanosis, clubbing, or edema Skin - no rashes Neuro - normal strength, moves extremities, follows commands  CXR (reviewed by me) - lungs clear   Resolved Hospital Problem list   Delirium  Assessment & Plan:   Lt PCA infarct, suspect embolic source. Plan - f/u labs from 6/16  - f/u TEE - neurology following - continue ASA 325 mg, plavix for now  Tachycardia. HTN. HLD. Plan - cardiology consulted - continue lipitor - Goal SBP < 160 - esmolol gtt and cleviprex gtt >> will need to clarify with cardiology and neurology about plans for these gtt's and whether it would be better to use one medication   DM type I. Plan  - SSI   CKD 2 >> unsure what baseline renal fx is. Plan - monitor renal fx, urine outpt  Best practice:  Diet: NPO in preparation for TEE DVT prophylaxis: Lovenox GI prophylaxis: Not indicated Mobility: OOB Code Status: full code Disposition: ICU  Labs    CMP Latest Ref Rng & Units 06/15/2019 06/14/2019 06/13/2019  Glucose 70 - 99 mg/dL 66(L) 105(H) 341(H)  BUN 6 - 20 mg/dL 17 12 15   Creatinine 0.61 - 1.24 mg/dL 2.26(H) 2.24(H) 2.54(H)  Sodium 135 - 145 mmol/L 143 140 134(L)  Potassium 3.5 - 5.1 mmol/L 3.7 3.6 3.8  Chloride 98 - 111 mmol/L 116(H) 111 101  CO2 22 - 32 mmol/L 19(L) 22 16(L)  Calcium 8.9 - 10.3 mg/dL 8.6(L) 8.0(L) 8.5(L)  Total Protein 6.5 - 8.1 g/dL - - -  Total Bilirubin 0.3 - 1.2 mg/dL - - -  Alkaline Phos 38 - 126 U/L - - -  AST 15 - 41 U/L - - -  ALT 0 - 44 U/L - - -   CBC Latest Ref Rng &  Units 06/15/2019 06/14/2019 06/13/2019  WBC 4.0 - 10.5 K/uL 17.1(H) 14.2(H) 35.6(H)  Hemoglobin 13.0 - 17.0 g/dL 14.1 11.5(L) 14.2  Hematocrit 39.0 - 52.0 % 40.8 33.7(L) 39.8  Platelets 150 - 400 K/uL 257 196 274   CBG (last 3)  Recent Labs    06/14/19 2331 06/15/19 0357 06/15/19 [place], MD [facility] Pulmonary/Critical Care 06/15/2019, 8:51 AM

## 2019-06-15 NOTE — Progress Notes (Signed)
Inpatient Rehabilitation Admissions Coordinator  Patient no longer in need of an inpt rehab admit. Outpt therapy now recommended.  Danne Baxter, RN, MSN Rehab Admissions Coordinator [phone] 06/15/2019 9:02 PM

## 2019-06-15 NOTE — Progress Notes (Signed)
Physical Therapy Treatment Patient Details Name: George [name] MRN: [id number] DOB: [birth year] Today's Date: 06/15/2019    History of Present Illness Patient is a 33 y/o male who presents with aphasia, difficulty "completing normal tasks," agitation and HA. Head CT- left PCA infarct. PMH includes type 1 diabetes.    PT Comments    Pt making excellent progress towards his physical therapy goals. Ambulating block around 2 units without physical difficulty or assist. Able to perform high level balance activities without overt loss of balance. Focused on dual tasking with mobility including naming tasks, way finding, and counting. Pt with good working memory but continues to struggle with short term memory. See below with updated recommendations.     Follow Up Recommendations  Outpatient PT;Supervision/Assistance - 24 hour     Equipment Recommendations  None recommended by PT    Recommendations for Other Services       Precautions / Restrictions Precautions Precautions: Fall Precaution Comments: watch BP Restrictions Weight Bearing Restrictions: No    Mobility  Bed Mobility Overal bed mobility: Modified Independent             General bed mobility comments: Out of bed in chair  Transfers Overall transfer level: Independent Equipment used: None Transfers: Sit to/from Stand Sit to Stand: Min guard;Supervision         General transfer comment: Supervision-Min Guard A for safety  Ambulation/Gait Ambulation/Gait assistance: Nurse, learning disability (Feet): 700 Feet Assistive device: None Gait Pattern/deviations: WFL(Within Functional Limits)     General Gait Details: No apparent gait abnormalities or gross unsteadiness noted with high level balance activities   Stairs             Wheelchair Mobility    Modified Rankin (Stroke Patients Only) Modified Rankin (Stroke Patients Only) Pre-Morbid Rankin Score: No symptoms Modified Rankin: Moderate  disability     Balance Overall balance assessment: No apparent balance deficits (not formally assessed)                                          Cognition Arousal/Alertness: Awake/alert Behavior During Therapy: WFL for tasks assessed/performed Overall Cognitive Status: Impaired/Different from baseline Area of Impairment: Attention;Awareness;Memory                   Current Attention Level: Selective Memory: Decreased short-term memory Following Commands: Follows multi-step commands inconsistently;Follows one step commands consistently;Follows one step commands with increased time Safety/Judgement: Decreased awareness of deficits Awareness: Emergent Problem Solving: Slow processing;Requires verbal cues General Comments: Decreased short term recall noted, intact working memory      Exercises Other Exercises Other Exercises: Dynamic balance: head turns, stops/starts, turns, stepping around obstacles Other Exercises: Dual tasking with mobility: naming animals that start with "A" or "B," states, wayfinding to rooms    General Comments General comments (skin integrity, edema, etc.): VSS throughout      Pertinent Vitals/Pain Pain Assessment: No/denies pain    Home Living                      Prior Function            PT Goals (current goals can now be found in the care plan section) Acute Rehab PT Goals Patient Stated Goal: to get better Potential to Achieve Goals: Good Progress towards PT goals: Progressing toward goals    Frequency    Min  3X/week      PT Plan Discharge plan needs to be updated;Frequency needs to be updated    Co-evaluation              AM-PAC PT "6 Clicks" Mobility   Outcome Measure  Help needed turning from your back to your side while in a flat bed without using bedrails?: None Help needed moving from lying on your back to sitting on the side of a flat bed without using bedrails?: None Help needed  moving to and from a bed to a chair (including a wheelchair)?: None Help needed standing up from a chair using your arms (e.g., wheelchair or bedside chair)?: None Help needed to walk in hospital room?: None Help needed climbing 3-5 steps with a railing? : None 6 Click Score: 24    End of Session   Activity Tolerance: Patient tolerated treatment well Patient left: in chair;with call bell/phone within reach Nurse Communication: Mobility status PT Visit Diagnosis: Difficulty in walking, not elsewhere classified (R26.2)     Time: 1430-1453 PT Time Calculation (min) (ACUTE ONLY): 23 min  Charges:  $Therapeutic Activity: 23-37 mins                     Ellamae Sia, PT, DPT Acute Rehabilitation Services Pager [phone] Office [phone]    Willy Eddy 06/15/2019, 4:56 PM

## 2019-06-15 NOTE — Progress Notes (Signed)
  Speech Language Pathology Treatment: Cognitive-Linquistic  Patient Details Name: George [name] MRN: [id number] DOB: [birth year] Today's Date: 06/15/2019 Time: 0051-1021 SLP Time Calculation (min) (ACUTE ONLY): 39 min  Assessment / Plan / Recommendation Clinical Impression  Pt was seen for cognitive-linguistic treatment session. He was cooperative throughout the session and reported that he has noticed improvement in his cognition compared to yesterday. He stated that he believes he is approximately 80-85% back to baseline at this time with regards to memory and word retrieval. Instances of word retrieval were occasionally noted and min. cues were required for use of compensatory strategies. He demonstrated 80% accuracy with responsive naming increasing to 100% with min. cues. He provided 5-9 items per abstract category during divergent naming tasks with an average of 7 items per category.  He demonstrated 60% accuracy with 3-item immediate recall, 40% accuracy with 4-item immediate recall, and 20% accuracy with 5-item immediate recall. With moderate cues his accuracy increased to 80% accuracy with recall of 5 items. He was able to recall objective information from voice mails with 80% accuracy increasing to 100% with min. cues. He completed time management problems with 80% accuracy increasing to 100% accuracy with min. cues. SLP will continue to follow pt.    HPI HPI: 32 year old with history of diabetes presenting with left occipital, temporal lobe stroke.      SLP Plan  Continue with current plan of care       Recommendations                   Follow up Recommendations: 24 hour supervision/assistance;Inpatient Rehab SLP Visit Diagnosis: Cognitive communication deficit (R17.356) Plan: Continue with current plan of care       Amarria Andreasen I. Hardin Negus, [place], [place] Office number [phone] Pager [place] 06/15/2019, 10:12 AM

## 2019-06-15 NOTE — Progress Notes (Signed)
Occupational Therapy Treatment Patient Details Name: George [name] MRN: [id number] DOB: 1986/08/30 Today's Date: 06/15/2019    History of present illness Patient is a 33 y/o male who presents with aphasia, difficulty "completing normal tasks," agitation and HA. Head CT- left PCA infarct. PMH includes type 1 diabetes.   OT comments  Pt progressing towards established OT goals and is very motivated to participate in therapy. Pt performing grooming tasks at sink with supervision for safety; recalling 2/3 grooming tasks and requiring Min cues to recall the third. Pt performing three part trail making task in hallway and requiring Mod cues to complete presenting with poor memory, problem solving, and awareness. Pt continues to present with left superior field cut and reports blurry vision at left eye. Continue to recommend dc to CIR for intensive therapy to optimize return to PLOF.  Will continue to follow acutely as admitted.    Follow Up Recommendations  CIR;Supervision/Assistance - 24 hour(May progress to home with OP) ; If progress to OP, will require neuro OP OT.    Equipment Recommendations  Other (comment)(Defer to next venue)    Recommendations for Other Services Rehab consult;Speech consult;PT consult    Precautions / Restrictions Precautions Precautions: Fall Precaution Comments: watch BP Restrictions Weight Bearing Restrictions: No       Mobility Bed Mobility Overal bed mobility: Modified Independent             General bed mobility comments: Able to get to EOB without assist. No dizziness.  Transfers Overall transfer level: Needs assistance Equipment used: None Transfers: Sit to/from Stand Sit to Stand: Min guard;Supervision         General transfer comment: Supervision-Min Guard A for safety    Balance Overall balance assessment: Mild deficits observed, not formally tested                                         ADL either performed  or assessed with clinical judgement   ADL Overall ADL's : Needs assistance/impaired     Grooming: Supervision/safety;Set up;Wash/dry face;Oral care;Wash/dry hands;Standing               Lower Body Dressing: Min guard;Sit to/from stand Lower Body Dressing Details (indicate cue type and reason): Pt donning socks appropiately with Miin Guard A EOB Toilet Transfer: Min guard;Ambulation(simulated to recliner)           Functional mobility during ADLs: Min guard General ADL Comments: Pt performing grooming tasks at sink and trial making task in hallway. Pt presenting with increased attention and balance. Also noting decreased impulsivity compared to yesterday.     Vision   Vision Assessment?: Yes Eye Alignment: Within Functional Limits Ocular Range of Motion: Within Functional Limits Tracking/Visual Pursuits: Able to track stimulus in all quads without difficulty Saccades: Within functional limits Convergence: Within functional limits Visual Fields: Right superior homonymous quadranopsia Additional Comments: Wearing his glasses today. Continues to present with left superior field cut. Tracking to all areas. denies diplopia. Poor awareness of field cut. Also pt with blurry vision at left eye. Right eye dominant.   Perception     Praxis      Cognition Arousal/Alertness: Awake/alert Behavior During Therapy: WFL for tasks assessed/performed Overall Cognitive Status: Impaired/Different from baseline Area of Impairment: Attention;Following commands;Awareness;Problem solving;Memory                   Current Attention Level:  Sustained Memory: Decreased short-term memory Following Commands: Follows multi-step commands inconsistently;Follows one step commands consistently;Follows one step commands with increased time Safety/Judgement: Decreased awareness of deficits Awareness: Emergent Problem Solving: Slow processing;Requires verbal cues General Comments: Pt presenting  with increased attention and problem solving. However, continues to present with poor ST memory, attention, organization of thoughts, and planning. Pt only recalling 2/3 grooming tasks asked to complete at sink. Also performing three part trail making task. Pt recalling parts of instructions such as turning left at a certain room but unable to recall which room or recalling his room number. Pt also asking for several reassurance stating "do you mean this?" or "is this ok?"        Exercises     Shoulder Instructions       General Comments VSS throughout    Pertinent Vitals/ Pain       Pain Assessment: No/denies pain  Home Living                                          Prior Functioning/Environment              Frequency  Min 3X/week        Progress Toward Goals  OT Goals(current goals can now be found in the care plan section)  Progress towards OT goals: Progressing toward goals  Acute Rehab OT Goals Patient Stated Goal: to get better OT Goal Formulation: With patient Time For Goal Achievement: 06/28/19 Potential to Achieve Goals: Good ADL Goals Pt Will Perform Grooming: Independently;standing Pt Will Perform Lower Body Dressing: Independently;sit to/from stand Pt Will Transfer to Toilet: Independently;ambulating;regular height toilet Additional ADL Goal #1: Pt will perform four part trail making task with 2-3 VCs Additional ADL Goal #2: Pt will demonstrate selective attention to perform ADL in distracting environement with 2-3 cues Additional ADL Goal #3: Pt will perform simple IADLs with 1-2 cues for problem solving  Plan Discharge plan remains appropriate    Co-evaluation                 AM-PAC OT "6 Clicks" Daily Activity     Outcome Measure   Help from another person eating meals?: None Help from another person taking care of personal grooming?: A Little Help from another person toileting, which includes using toliet, bedpan, or  urinal?: A Little Help from another person bathing (including washing, rinsing, drying)?: A Little Help from another person to put on and taking off regular upper body clothing?: A Little Help from another person to put on and taking off regular lower body clothing?: A Little 6 Click Score: 19    End of Session Equipment Utilized During Treatment: Gait belt  OT Visit Diagnosis: Unsteadiness on feet (R26.81);Other abnormalities of gait and mobility (R26.89);Muscle weakness (generalized) (M62.81);Other symptoms and signs involving cognitive function   Activity Tolerance Patient tolerated treatment well   Patient Left in chair;with call bell/phone within reach;with nursing/sitter in room   Nurse Communication Mobility status;Other (comment)(Call parents for glasses)        Time: 1136-1205 OT Time Calculation (min): 29 min  Charges: OT General Charges $OT Visit: 1 Visit OT Treatments $Self Care/Home Management : 8-22 mins $Cognitive Funtion inital: Initial 15 mins  Chaniah Cisse MSOT, OTR/L Acute Rehab Pager: [phone] Office: [place] 06/15/2019, 2:39 PM

## 2019-06-15 NOTE — Progress Notes (Signed)
Pt remains on esmolol drip for rate control and clevidipine for SBP goal <160. No complains of pain. Patient remains oriented. Cray this AM. NPO since midnight for TEE today.

## 2019-06-15 NOTE — Progress Notes (Signed)
  Echocardiogram 2D Echocardiogram has been performed.  George [name] [name] 06/15/2019, 4:04 PM

## 2019-06-15 NOTE — Progress Notes (Signed)
TEE is now rescheduled for Friday 6/19. Pt and family updated, diet order put back in. Marcelia Petersen, Rande Brunt, RN

## 2019-06-15 NOTE — Progress Notes (Signed)
STROKE TEAM PROGRESS NOTE   INTERVAL HISTORY Pt sitting in bed, having breakfast. His BP still high and was put on cleviprex and his HR still high and currently on Esmolol drip. Right visual field still decreased acuity but no gross hemianopia. TEE pending  Vitals:   06/15/19 0715 06/15/19 0800 06/15/19 0815 06/15/19 0900  BP: (!) 148/94 (!) 160/97 (!) 150/91 (!) 147/92  Pulse: (!) 101 (!) 103 (!) 102 97  Resp: 16 (!) 24 19 (!) 22  Temp:  98.9 F (37.2 C)    TempSrc:  Oral    SpO2: 99% 100% 100% 100%  Weight:      Height:        CBC:  Recent Labs  Lab 06/12/19 2305  06/14/19 0218 06/15/19 0158  WBC 21.9*   < > 14.2* 17.1*  NEUTROABS 19.1*  --   --   --   HGB 17.1*   < > 11.5* 14.1  HCT 48.2   < > 33.7* 40.8  MCV 85.9   < > 88.5 88.5  PLT 335   < > 196 257   < > = values in this interval not displayed.    Basic Metabolic Panel:  Recent Labs  Lab 06/14/19 0218 06/15/19 0158  NA 140 143  K 3.6 3.7  CL 111 116*  CO2 22 19*  GLUCOSE 105* 66*  BUN 12 17  CREATININE 2.24* 2.26*  CALCIUM 8.0* 8.6*  MG 2.1 2.0  PHOS 3.7 4.4   Lipid Panel:     Component Value Date/Time   CHOL 215 (H) 06/13/2019 0251   TRIG 298 (H) 06/13/2019 0251   HDL 53 06/13/2019 0251   CHOLHDL 4.1 06/13/2019 0251   VLDL 60 (H) 06/13/2019 0251   LDLCALC 102 (H) 06/13/2019 0251   HgbA1c:  Lab Results  Component Value Date   HGBA1C 8.4 (H) 06/13/2019   Urine Drug Screen:     Component Value Date/Time   LABOPIA NONE DETECTED 06/13/2019 0423   COCAINSCRNUR NONE DETECTED 06/13/2019 0423   LABBENZ NONE DETECTED 06/13/2019 0423   AMPHETMU NONE DETECTED 06/13/2019 0423   THCU POSITIVE (A) 06/13/2019 0423   LABBARB NONE DETECTED 06/13/2019 0423    Alcohol Level     Component Value Date/Time   ETH <10 06/12/2019 2305    IMAGING Mr Jodene Nam Head Wo Contrast  Result Date: 06/14/2019 CLINICAL DATA:  33 y/o M; speech difficulty. Focal neuro deficit, > 6 hrs, stroke suspected Altered level of  consciousness (LOC), unexplained; Stroke, follow up. History of type 1 diabetes. EXAM: MRI HEAD WITHOUT CONTRAST MRA HEAD WITHOUT CONTRAST TECHNIQUE: Multiplanar, multiecho pulse sequences of the brain and surrounding structures were obtained without intravenous contrast. Angiographic images of the head were obtained using MRA technique without contrast. COMPARISON:  None. FINDINGS: MRI HEAD FINDINGS Brain: Large region of reduced diffusion within the left PCA territory involving medial temporal lobe, occipital lobe, and thalamus compatible with late acute/early subacute infarction. The distribution of the infarct is stable in comparison with prior CT of head given differences in technique. The infarct is T2 hyperintense and demonstrates mild cortical swelling. There is petechial hemorrhage without associated mass effect within the left occipital lobe, [place] calssification 1a: HI1, scattered small petechiae, no mass effect. Vascular: Normal flow voids. Skull and upper cervical spine: Normal marrow signal. Sinuses/Orbits: Negative. Other: None. MRA HEAD FINDINGS Internal carotid arteries:  Patent. Anterior cerebral arteries:  Patent. Middle cerebral arteries: Patent. Anterior communicating artery: Patent. Posterior communicating arteries:  Patent. Posterior cerebral arteries: Patent right. Patent proximal left. Severe stenosis of a large left posterior temporal artery branch origin (series 1013, image 3). Basilar artery:  Patent. Vertebral arteries:  Patent. IMPRESSION: MRI head: Large left PCA distribution late acute/early subacute infarction. Petechial hemorrhage in left occipital lobe, [place] calssification 1a: HI1, scattered small petechiae, no mass effect. MRA head: 1. Severe stenosis of a large left posterior temporal artery branch of the left PCA. 2. No proximal large vessel occlusion, aneurysm, or high-grade stenosis. Electronically Signed   By: Kristine Garbe M.D.   On: 06/14/2019 19:39    Mr Brain Wo Contrast  Result Date: 06/14/2019 CLINICAL DATA:  33 y/o M; speech difficulty. Focal neuro deficit, > 6 hrs, stroke suspected Altered level of consciousness (LOC), unexplained; Stroke, follow up. History of type 1 diabetes. EXAM: MRI HEAD WITHOUT CONTRAST MRA HEAD WITHOUT CONTRAST TECHNIQUE: Multiplanar, multiecho pulse sequences of the brain and surrounding structures were obtained without intravenous contrast. Angiographic images of the head were obtained using MRA technique without contrast. COMPARISON:  None. FINDINGS: MRI HEAD FINDINGS Brain: Large region of reduced diffusion within the left PCA territory involving medial temporal lobe, occipital lobe, and thalamus compatible with late acute/early subacute infarction. The distribution of the infarct is stable in comparison with prior CT of head given differences in technique. The infarct is T2 hyperintense and demonstrates mild cortical swelling. There is petechial hemorrhage without associated mass effect within the left occipital lobe, [place] calssification 1a: HI1, scattered small petechiae, no mass effect. Vascular: Normal flow voids. Skull and upper cervical spine: Normal marrow signal. Sinuses/Orbits: Negative. Other: None. MRA HEAD FINDINGS Internal carotid arteries:  Patent. Anterior cerebral arteries:  Patent. Middle cerebral arteries: Patent. Anterior communicating artery: Patent. Posterior communicating arteries:  Patent. Posterior cerebral arteries: Patent right. Patent proximal left. Severe stenosis of a large left posterior temporal artery branch origin (series 1013, image 3). Basilar artery:  Patent. Vertebral arteries:  Patent. IMPRESSION: MRI head: Large left PCA distribution late acute/early subacute infarction. Petechial hemorrhage in left occipital lobe, [place] calssification 1a: HI1, scattered small petechiae, no mass effect. MRA head: 1. Severe stenosis of a large left posterior temporal artery branch of the left  PCA. 2. No proximal large vessel occlusion, aneurysm, or high-grade stenosis. Electronically Signed   By: Kristine Garbe M.D.   On: 06/14/2019 19:39   Dg Chest Port 1 View  Result Date: 06/15/2019 CLINICAL DATA:  Respiratory failure EXAM: PORTABLE CHEST 1 VIEW COMPARISON:  June 13, 2019 FINDINGS: The heart size and mediastinal contours are within normal limits. Both lungs are clear. The visualized skeletal structures are unremarkable. IMPRESSION: No active disease. Electronically Signed   By: Dorise Bullion III M.D   On: 06/15/2019 08:33   Vas US Carotid  Result Date: 06/14/2019 Carotid Arterial Duplex Study Indications:       CVA, Speech disturbance and Gait disturbance. Risk Factors:      Diabetes. Limitations:       Patient would not keep head turned. Comparison Study:  No prior study on file for comparison. Performing Technologist: Sharion Dove RVS  Examination Guidelines: A complete evaluation includes B-mode imaging, spectral Doppler, color Doppler, and power Doppler as needed of all accessible portions of each vessel. Bilateral testing is considered an integral part of a complete examination. Limited examinations for reoccurring indications may be performed as noted.  Right Carotid Findings: +----------+--------+--------+--------+--------+--------+           PSV cm/sEDV cm/sStenosisDescribeComments +----------+--------+--------+--------+--------+--------+ CCA Prox  156  12                               +----------+--------+--------+--------+--------+--------+ CCA Distal111     17                               +----------+--------+--------+--------+--------+--------+ ICA Prox  92      12                               +----------+--------+--------+--------+--------+--------+ ICA Mid                                   tortuous +----------+--------+--------+--------+--------+--------+ ICA Distal93      20                      tortuous  +----------+--------+--------+--------+--------+--------+ ECA       74      9                       tortuous +----------+--------+--------+--------+--------+--------+ +----------+--------+-------+--------+-------------------+           PSV cm/sEDV cmsDescribeArm Pressure (mmHG) +----------+--------+-------+--------+-------------------+ [id number]                                        +----------+--------+-------+--------+-------------------+ +---------+--------+--+--------+-+ VertebralPSV cm/s45EDV cm/s8 +---------+--------+--+--------+-+  Left Carotid Findings: +----------+--------+--------+--------+--------+--------+           PSV cm/sEDV cm/sStenosisDescribeComments +----------+--------+--------+--------+--------+--------+ CCA Prox  171     27                               +----------+--------+--------+--------+--------+--------+ CCA Distal138     27                               +----------+--------+--------+--------+--------+--------+ +----------+--------+--------+--------+-------------------+ SubclavianPSV cm/sEDV cm/sDescribeArm Pressure (mmHG) +----------+--------+--------+--------+-------------------+           183                                         +----------+--------+--------+--------+-------------------+ +---------+--------+--+--------+--+ VertebralPSV cm/s73EDV cm/s12 +---------+--------+--+--------+--+  Summary: Right Carotid: The extracranial vessels were near-normal with only minimal wall                thickening or plaque. Highly tortuous ICA. Left Carotid: Unable to scan left ICA and ECA secondary to patient compliance. Vertebrals:  Bilateral vertebral arteries demonstrate antegrade flow. Subclavians: Normal flow hemodynamics were seen in bilateral subclavian              arteries. *See table(s) above for measurements and observations.  Electronically signed by Antony Contras MD on 06/14/2019 at 7:59:14 AM.    Final    Vas [place]  Lower Extremity Venous (dvt)  Result Date: 06/13/2019  Lower Venous Study Indications: Stroke. Other Indications: Patient in 4 point restraints with feet tied together. Comparison Study: No prior study on file for comparison. Performing Technologist: Sharion Dove RVS  Examination Guidelines: A complete evaluation includes B-mode imaging, spectral Doppler,  color Doppler, and power Doppler as needed of all accessible portions of each vessel. Bilateral testing is considered an integral part of a complete examination. Limited examinations for reoccurring indications may be performed as noted.  +---------+---------------+---------+-----------+----------+-------+ RIGHT    CompressibilityPhasicitySpontaneityPropertiesSummary +---------+---------------+---------+-----------+----------+-------+ CFV      Full           Yes      Yes                          +---------+---------------+---------+-----------+----------+-------+ SFJ      Full                                                 +---------+---------------+---------+-----------+----------+-------+ FV Prox  Full                                                 +---------+---------------+---------+-----------+----------+-------+ FV Mid   Full                                                 +---------+---------------+---------+-----------+----------+-------+ FV DistalFull                                                 +---------+---------------+---------+-----------+----------+-------+ PFV      Full                                                 +---------+---------------+---------+-----------+----------+-------+ POP      Full           Yes      Yes                          +---------+---------------+---------+-----------+----------+-------+ PTV      Full                                                 +---------+---------------+---------+-----------+----------+-------+ PERO     Full                                                  +---------+---------------+---------+-----------+----------+-------+   +---------+---------------+---------+-----------+----------+-------+ LEFT     CompressibilityPhasicitySpontaneityPropertiesSummary +---------+---------------+---------+-----------+----------+-------+ CFV      Full           Yes      Yes                          +---------+---------------+---------+-----------+----------+-------+ SFJ      Full                                                 +---------+---------------+---------+-----------+----------+-------+  FV Prox  Full                                                 +---------+---------------+---------+-----------+----------+-------+ FV Mid   Full                                                 +---------+---------------+---------+-----------+----------+-------+ FV DistalFull                                                 +---------+---------------+---------+-----------+----------+-------+ PFV      Full                                                 +---------+---------------+---------+-----------+----------+-------+ POP      Full           Yes      Yes                          +---------+---------------+---------+-----------+----------+-------+ PTV      Full                                                 +---------+---------------+---------+-----------+----------+-------+ PERO     Full                                                 +---------+---------------+---------+-----------+----------+-------+     Summary: Right: There is no evidence of deep vein thrombosis in the lower extremity. Left: There is no evidence of deep vein thrombosis in the lower extremity.  *See table(s) above for measurements and observations. Electronically signed by Ruta Hinds MD on 06/13/2019 at 5:15:12 PM.    Final     PHYSICAL EXAM Temp:  [98.3 F (36.8 C)-98.9 F (37.2 C)] 98.9 F (37.2 C) (06/17  0800) Pulse Rate:  [89-154] 97 (06/17 0900) Resp:  [11-31] 22 (06/17 0900) BP: (90-219)/(77-149) 147/92 (06/17 0900) SpO2:  [98 %-100 %] 100 % (06/17 0900)  General - Well nourished, well developed, not in acute distress  Ophthalmologic - fundi not visualized due to noncooperation.  Cardiovascular - Regular rhythm, but mild tachycardia.  Mental Status -  Level of arousal and orientation to time, place, and person were intact. Language including expression, naming, repetition, comprehension was assessed and found intact. Fund of Knowledge was assessed and was intact.  Cranial Nerves II - XII - II - Visual field intact OU, no hemianopia but right visual field decreased acuity without gross hemianopia. III, IV, VI - Extraocular movements intact. V - Facial sensation intact bilaterally. VII - Facial movement intact bilaterally. VIII - Hearing & vestibular intact bilaterally. X - Palate elevates symmetrically. XI Geryl Councilman  turning & shoulder shrug intact bilaterally. XII - Tongue protrusion intact.  Motor Strength - The patient's strength was normal in all extremities and pronator drift was absent.  Bulk was normal and fasciculations were absent.   Motor Tone - Muscle tone was assessed at the neck and appendages and was normal.  Reflexes - The patient's reflexes were symmetrical in all extremities and he had no pathological reflexes.  Sensory - Light touch, temperature/pinprick were assessed and were symmetrical.    Coordination - The patient had normal movements in the hands and feet with no ataxia or dysmetria.  Tremor was absent.  Gait and Station - deferred.   ASSESSMENT/PLAN Mr. Plez Belton is a 33 y.o. male with history of type I DB presenting with receptive aphasia and word salad responses, not oriented with HA.   Stroke: Large L PCA infarct, suspect embolic source, workup underway  CT head 6/14 2152 large L PCA infarct  MRI  Large L PCA infarct w/ petechial hmg L  occipital lobe HI1  MRA  Severe stenosis L PCA  Carotid Doppler unremarkable  2D Echo pending  LE venous dopplers no DVT  TEE given severe leukocytosis and stroke in young age rescheduled for Friday   hypercoagulable/vasculitis pending (ESR 32, CRP 3.8, ANA neg)  LDL 102  HgbA1c 8.4  Lovenox 40 mg sq daily for VTE prophylaxis  No antithrombotic prior to admission, now on aspirin 325 mg and plavix daily.  Therapy recommendations:  pending   Disposition:  pending   Extreme Agitation, resolved  Required mult meds for sedation including precedex  Restrained, now off  CCM managing  Off precedex  Hypertension/Tachycardia  No hx HTN. Not on home meds  SBP goal < 160  Long term BP goal normotensive  BP improved, Mild tachycardia  On esmolol and cleviprex, wean off as able  Put on amlodipine 10 mg and metoprolol 25 bid  ?? AKI on CKD  No previous Cre to compare  Cr 2.23->2.54->2.24->2.26  Urine protein large  Encourage po intake  Hyperlipidemia  Home meds:  No statin  LDL 102, goal < 70  On lipitor 40  Continue statin at discharge  Diabetes type I Uncontrolled Hypoglycemic episode  HgbA1c 8.4, goal < 7.0  CBGs  SSI  Still hyperglycemia  On Levemir  On diet  CCM on board  Leukocytosis  WBC 21.9->35.6->14.2->17.1  Likely reactive  Lactic acid 2.5-> 6.5->1.7  CXR no active disease  UA negative  Blood culture no growth 1 day  Afebrile  Other Stroke Risk Factors  ETOH use, advised to drink no more than 2 drink(s) a day  THC use - UDS positive for THC this admission   Other [facility] day # 2  This patient is critically ill due to left PCA stroke, extreme agitation, DM type I, severe leukocytosis, AK on CKD and at significant risk of neurological worsening, death form recurrent stroke, hemorrhagic conversion, DKA, sepsis, renal failure. This patient's care requires constant monitoring of vital signs,  hemodynamics, respiratory and cardiac monitoring, review of multiple databases, neurological assessment, discussion with family, other specialists and medical decision making of high complexity. I spent 30 minutes of neurocritical care time in the care of this patient. I have discussed with Dr. Halford Chessman.  Rosalin Hawking, MD PhD Stroke Neurology 06/15/2019 9:59 AM   To contact Stroke Continuity provider, please refer to [email or web address] After hours, contact General Neurology

## 2019-06-15 NOTE — Progress Notes (Addendum)
Inpatient Diabetes Program Recommendations  AACE/ADA: New Consensus Statement on Inpatient Glycemic Control (2015)  Target Ranges:  Prepandial:   less than 140 mg/dL      Peak postprandial:   less than 180 mg/dL (1-2 hours)      Critically ill patients:  140 - 180 mg/dL   Lab Results  Component Value Date   GLUCAP 239 (H) 06/15/2019   HGBA1C 8.4 (H) 06/13/2019    Review of Glycemic Control Results for George [name], George [name] (MRN [id number]) as of 06/15/2019 11:26  Ref. Range 06/14/2019 23:31 06/15/2019 03:57 06/15/2019 08:19  Glucose-Capillary Latest Ref Range: 70 - 99 mg/dL 258 (H) 147 (H) 239 (H)   Diabetes history: Type 1 DM Outpatient Diabetes medications: NPH 30 units QD & Novolin R 4-5 units TID Current orders for Inpatient glycemic control: Novolog 0-9 units Q4H  Inpatient Diabetes Program Recommendations:    Consider the following:  -Levemir 10 units QD -Novolog 3 units TID (assuming patient is consuming >50% of meals) - Novolog 0-9 units TID  Spoke with patient regarding outpatient diabetes management. Patient was diagnosed when he was 33 yo. Has been following orders with outpatient insulin from [facility] in [place], but has not followed up in years. Never missed doses. Reviewed patient's current A1c of 8.4%. Explained what a A1c is and what it measures. Also reviewed goal A1c with patient, importance of good glucose control @ home, and blood sugar goals. Reviewed patho of DM, need for insulin, role of glucose control and stroke, vascular changes and other comorbidites. Patient has a meter and supplies. Verifies checking 4 times per day and usually post prandially. Occasionally, blood sugars are as high as 300's, however seldom.  Admits to eating a lot of bread. Encouraged to continue counting carbohydrates and being mindful of carb intake. Patient aware of what he needs to do. Encouraged patient to follow up and have an advocate to help prevent additional events r/t to  diabetes. CM consult placed for PCP appointment. Patient not interested in further education at this time.  In preparation for discharge, patient could benefit from BID dosing of NPH (as action duration is ~12 hours).  Thanks, Bronson Curb, MSN, RNC-OB Diabetes Coordinator [phone] (8a-5p)

## 2019-06-15 NOTE — Progress Notes (Signed)
Progress Note  Patient Name: George [name] Date of Encounter: 06/15/2019  Primary Cardiologist: No primary care provider on file.   Subjective   Remains on Esmolol gtt for HR control and Clevidipine gtt for BP control. HR better controlled at 104bpm.    Inpatient Medications    Scheduled Meds:  aspirin EC  325 mg Oral Daily   atorvastatin  40 mg Oral q1800   Chlorhexidine Gluconate Cloth  6 each Topical Daily   clopidogrel  75 mg Oral Daily   enoxaparin (LOVENOX) injection  40 mg Subcutaneous Q24H   insulin aspart  0-9 Units Subcutaneous Q4H   Continuous Infusions:  sodium chloride Stopped (06/13/19 1639)   clevidipine 3 mg/hr (06/15/19 0800)   dextrose 5 % and 0.9% NaCl Stopped (06/14/19 1002)   esmolol 200 mcg/kg/min (06/15/19 0800)   PRN Meds: sodium chloride, acetaminophen **OR** acetaminophen (TYLENOL) oral liquid 160 mg/5 mL **OR** acetaminophen, fentaNYL (SUBLIMAZE) injection, haloperidol lactate, hydrALAZINE, labetalol, LORazepam, metoprolol tartrate, senna-docusate   Vital Signs    Vitals:   06/15/19 0700 06/15/19 0701 06/15/19 0715 06/15/19 0800  BP:  (!) 159/104 (!) 148/94 (!) 160/97  Pulse: 95 (!) 110 (!) 101 (!) 103  Resp:  20 16 (!) 24  Temp:      TempSrc:      SpO2:  99% 99% 100%  Weight:      Height:        Intake/Output Summary (Last 24 hours) at 06/15/2019 0807 Last data filed at 06/15/2019 0800 Gross per 24 hour  Intake 687.22 ml  Output 1950 ml  Net -1262.78 ml   Filed Weights   06/12/19 2128  Weight: 56.7 kg    Telemetry    Sinus tachycardia - Personally Reviewed  ECG    No new EKG to review - Personally Reviewed  Physical Exam   GEN: No acute distress.   Neck: No JVD Cardiac: RRR, no murmurs, rubs, or gallops.  Respiratory: Clear to auscultation bilaterally. GI: Soft, nontender, non-distended  MS: No edema; No deformity. Neuro:  Nonfocal  Psych: Normal affect   Labs    Chemistry Recent Labs  Lab  06/12/19 2305  06/13/19 0251 06/14/19 0218 06/15/19 0158  NA 134*   < > 134* 140 143  K 3.2*   < > 3.8 3.6 3.7  CL 98  --  101 111 116*  CO2 23  --  16* 22 19*  GLUCOSE 132*  --  341* 105* 66*  BUN 14  --  _0 CREATININE 2.23*  --  2.54* 2.24* 2.26*  CALCIUM 9.6  --  8.5* 8.0* 8.6*  PROT 7.2  --   --   --   --   ALBUMIN 3.7  --   --   --   --   AST 22  --   --   --   --   ALT 16  --   --   --   --   ALKPHOS 128*  --   --   --   --   BILITOT 0.4  --   --   --   --   GFRNONAA 38*  --  32* 37* 37*  GFRAA 44*  --  37* 43* 43*  ANIONGAP 13  --  17* 7 8   < > = values in this interval not displayed.     Hematology Recent Labs  Lab 06/13/19 0251 06/14/19 0218 06/15/19 0158  WBC 35.6* 14.2* 17.1*  RBC 4.58 3.81* 4.61  HGB 14.2 11.5* 14.1  HCT 39.8 33.7* 40.8  MCV 86.9 88.5 88.5  MCH 31.0 30.2 30.6  MCHC 35.7 34.1 34.6  RDW 12.6 13.3 13.4  PLT 274 196 257    Cardiac Enzymes Recent Labs  Lab 06/14/19 1446 06/14/19 1925 06/15/19 0158  TROPONINI 0.24* 0.28* 0.34*   No results for input(s): TROPIPOC in the last 168 hours.   BNPNo results for input(s): BNP, PROBNP in the last 168 hours.   DDimer No results for input(s): DDIMER in the last 168 hours.   Radiology    Mr Jodene Nam Head Wo Contrast  Result Date: 06/14/2019 CLINICAL DATA:  33 y/o M; speech difficulty. Focal neuro deficit, > 6 hrs, stroke suspected Altered level of consciousness (LOC), unexplained; Stroke, follow up. History of type 1 diabetes. EXAM: MRI HEAD WITHOUT CONTRAST MRA HEAD WITHOUT CONTRAST TECHNIQUE: Multiplanar, multiecho pulse sequences of the brain and surrounding structures were obtained without intravenous contrast. Angiographic images of the head were obtained using MRA technique without contrast. COMPARISON:  None. FINDINGS: MRI HEAD FINDINGS Brain: Large region of reduced diffusion within the left PCA territory involving medial temporal lobe, occipital lobe, and thalamus compatible with late  acute/early subacute infarction. The distribution of the infarct is stable in comparison with prior CT of head given differences in technique. The infarct is T2 hyperintense and demonstrates mild cortical swelling. There is petechial hemorrhage without associated mass effect within the left occipital lobe, [place] calssification 1a: HI1, scattered small petechiae, no mass effect. Vascular: Normal flow voids. Skull and upper cervical spine: Normal marrow signal. Sinuses/Orbits: Negative. Other: None. MRA HEAD FINDINGS Internal carotid arteries:  Patent. Anterior cerebral arteries:  Patent. Middle cerebral arteries: Patent. Anterior communicating artery: Patent. Posterior communicating arteries:  Patent. Posterior cerebral arteries: Patent right. Patent proximal left. Severe stenosis of a large left posterior temporal artery branch origin (series 1013, image 3). Basilar artery:  Patent. Vertebral arteries:  Patent. IMPRESSION: MRI head: Large left PCA distribution late acute/early subacute infarction. Petechial hemorrhage in left occipital lobe, [place] calssification 1a: HI1, scattered small petechiae, no mass effect. MRA head: 1. Severe stenosis of a large left posterior temporal artery branch of the left PCA. 2. No proximal large vessel occlusion, aneurysm, or high-grade stenosis. Electronically Signed   By: Kristine Garbe M.D.   On: 06/14/2019 19:39   Mr Brain Wo Contrast  Result Date: 06/14/2019 CLINICAL DATA:  33 y/o M; speech difficulty. Focal neuro deficit, > 6 hrs, stroke suspected Altered level of consciousness (LOC), unexplained; Stroke, follow up. History of type 1 diabetes. EXAM: MRI HEAD WITHOUT CONTRAST MRA HEAD WITHOUT CONTRAST TECHNIQUE: Multiplanar, multiecho pulse sequences of the brain and surrounding structures were obtained without intravenous contrast. Angiographic images of the head were obtained using MRA technique without contrast. COMPARISON:  None. FINDINGS: MRI HEAD  FINDINGS Brain: Large region of reduced diffusion within the left PCA territory involving medial temporal lobe, occipital lobe, and thalamus compatible with late acute/early subacute infarction. The distribution of the infarct is stable in comparison with prior CT of head given differences in technique. The infarct is T2 hyperintense and demonstrates mild cortical swelling. There is petechial hemorrhage without associated mass effect within the left occipital lobe, [place]idelberg calssification 1a: HI1, scattered small petechiae, no mass effect. Vascular: Normal flow voids. Skull and upper cervical spine: Normal marrow signal. Sinuses/Orbits: Negative. Other: None. MRA HEAD FINDINGS Internal carotid arteries:  Patent. Anterior cerebral arteries:  Patent. Middle cerebral arteries: Patent. Anterior communicating  artery: Patent. Posterior communicating arteries:  Patent. Posterior cerebral arteries: Patent right. Patent proximal left. Severe stenosis of a large left posterior temporal artery branch origin (series 1013, image 3). Basilar artery:  Patent. Vertebral arteries:  Patent. IMPRESSION: MRI head: Large left PCA distribution late acute/early subacute infarction. Petechial hemorrhage in left occipital lobe, [place]idelberg calssification 1a: HI1, scattered small petechiae, no mass effect. MRA head: 1. Severe stenosis of a large left posterior temporal artery branch of the left PCA. 2. No proximal large vessel occlusion, aneurysm, or high-grade stenosis. Electronically Signed   By: Kristine Garbe M.D.   On: 06/14/2019 19:39   Dg Chest Port 1 View  Result Date: 06/13/2019 CLINICAL DATA:  Leukocytosis. EXAM: PORTABLE CHEST 1 VIEW COMPARISON:  None. FINDINGS: The heart size and mediastinal contours are within normal limits. Both lungs are clear. The visualized skeletal structures are unremarkable. IMPRESSION: No active disease. Electronically Signed   By: Earle Gell M.D.   On: 06/13/2019 09:10   Vas US  Carotid  Result Date: 06/14/2019 Carotid Arterial Duplex Study Indications:       CVA, Speech disturbance and Gait disturbance. Risk Factors:      Diabetes. Limitations:       Patient would not keep head turned. Comparison Study:  No prior study on file for comparison. Performing Technologist: Sharion Dove RVS  Examination Guidelines: A complete evaluation includes B-mode imaging, spectral Doppler, color Doppler, and power Doppler as needed of all accessible portions of each vessel. Bilateral testing is considered an integral part of a complete examination. Limited examinations for reoccurring indications may be performed as noted.  Right Carotid Findings: +----------+--------+--------+--------+--------+--------+             PSV cm/s EDV cm/s Stenosis Describe Comments  +----------+--------+--------+--------+--------+--------+  CCA Prox   156      12                                   +----------+--------+--------+--------+--------+--------+  CCA Distal 111      17                                   +----------+--------+--------+--------+--------+--------+  ICA Prox   92       12                                   +----------+--------+--------+--------+--------+--------+  ICA Mid                                        tortuous  +----------+--------+--------+--------+--------+--------+  ICA Distal 93       20                         tortuous  +----------+--------+--------+--------+--------+--------+  ECA        74       9                          tortuous  +----------+--------+--------+--------+--------+--------+ +----------+--------+-------+--------+-------------------+             PSV cm/s EDV cms Describe Arm Pressure (mmHG)  +----------+--------+-------+--------+-------------------+  Subclavian 195                                            +----------+--------+-------+--------+-------------------+ +---------+--------+--+--------+-+  Vertebral PSV cm/s 45 EDV cm/s 8  +---------+--------+--+--------+-+  Left  Carotid Findings: +----------+--------+--------+--------+--------+--------+             PSV cm/s EDV cm/s Stenosis Describe Comments  +----------+--------+--------+--------+--------+--------+  CCA Prox   171      27                                   +----------+--------+--------+--------+--------+--------+  CCA Distal 138      27                                   +----------+--------+--------+--------+--------+--------+ +----------+--------+--------+--------+-------------------+  Subclavian PSV cm/s EDV cm/s Describe Arm Pressure (mmHG)  +----------+--------+--------+--------+-------------------+             183                                             +----------+--------+--------+--------+-------------------+ +---------+--------+--+--------+--+  Vertebral PSV cm/s 73 EDV cm/s 12  +---------+--------+--+--------+--+  Summary: Right Carotid: The extracranial vessels were near-normal with only minimal wall                thickening or plaque. Highly tortuous ICA. Left Carotid: Unable to scan left ICA and ECA secondary to patient compliance. Vertebrals:  Bilateral vertebral arteries demonstrate antegrade flow. Subclavians: Normal flow hemodynamics were seen in bilateral subclavian              arteries. *See table(s) above for measurements and observations.  Electronically signed by Antony Contras MD on 06/14/2019 at 7:59:14 AM.    Final    Vas [place] Lower Extremity Venous (dvt)  Result Date: 06/13/2019  Lower Venous Study Indications: Stroke. Other Indications: Patient in 4 point restraints with feet tied together. Comparison Study: No prior study on file for comparison. Performing Technologist: Sharion Dove RVS  Examination Guidelines: A complete evaluation includes B-mode imaging, spectral Doppler, color Doppler, and power Doppler as needed of all accessible portions of each vessel. Bilateral testing is considered an integral part of a complete examination. Limited examinations for reoccurring indications may be  performed as noted.  +---------+---------------+---------+-----------+----------+-------+  RIGHT     Compressibility Phasicity Spontaneity Properties Summary  +---------+---------------+---------+-----------+----------+-------+  CFV       Full            Yes       Yes                             +---------+---------------+---------+-----------+----------+-------+  SFJ       Full                                                      +---------+---------------+---------+-----------+----------+-------+  FV Prox   Full                                                      +---------+---------------+---------+-----------+----------+-------+  FV Mid  Full                                                      +---------+---------------+---------+-----------+----------+-------+  FV Distal Full                                                      +---------+---------------+---------+-----------+----------+-------+  PFV       Full                                                      +---------+---------------+---------+-----------+----------+-------+  POP       Full            Yes       Yes                             +---------+---------------+---------+-----------+----------+-------+  PTV       Full                                                      +---------+---------------+---------+-----------+----------+-------+  PERO      Full                                                      +---------+---------------+---------+-----------+----------+-------+   +---------+---------------+---------+-----------+----------+-------+  LEFT      Compressibility Phasicity Spontaneity Properties Summary  +---------+---------------+---------+-----------+----------+-------+  CFV       Full            Yes       Yes                             +---------+---------------+---------+-----------+----------+-------+  SFJ       Full                                                       +---------+---------------+---------+-----------+----------+-------+  FV Prox   Full                                                      +---------+---------------+---------+-----------+----------+-------+  FV Mid    Full                                                      +---------+---------------+---------+-----------+----------+-------+  FV Distal Full                                                      +---------+---------------+---------+-----------+----------+-------+  PFV       Full                                                      +---------+---------------+---------+-----------+----------+-------+  POP       Full            Yes       Yes                             +---------+---------------+---------+-----------+----------+-------+  PTV       Full                                                      +---------+---------------+---------+-----------+----------+-------+  PERO      Full                                                      +---------+---------------+---------+-----------+----------+-------+     Summary: Right: There is no evidence of deep vein thrombosis in the lower extremity. Left: There is no evidence of deep vein thrombosis in the lower extremity.  *See table(s) above for measurements and observations. Electronically signed by Ruta Hinds MD on 06/13/2019 at 5:15:12 PM.    Final     Cardiac Studies   none  Patient Profile     33 y.o. male history of Dm1 admitted with slurred speech and balance issues at home along with headache. Found by neurology to have receptive aphasia and diagnosed with left occipital temoral lobse CVA, outside of tpa window  Later during admission became severely agitated, given several rounds of ativa, haldol. Significant HTN and tachycardia has been an ongoing issue even with resolution of agitation. Cardiology consulted to help manage.  Assessment & Plan    1.  Severe tachycardia -- available EKG show sinus tach 120s-150s, as well as a  narrow complex regular tach 170s - tele reviewed, severe narrow complex tach 170s that looks to be sinus tach. Following the rhythms you can see where sinus tach speeds up and slow downs, there is no abrupt onset as would be seen with an SVT.  - unclear etiology, perhaps sympathetic hyperactivity/autonomic instability after his CVA. His UDS was negative, he denies any EtOH use and has no other signs of EtOH withdrawal.  - no signicant anemia, normal TSH, not hypoxic, comfortable not in pain, not currently agitated. Nothing to support tachy from PE or pericardial effusion. Essentially looks for some reason to have autonomic/sympathetic surge. -started on Esmolol gtt last night with improvement in HR -Esmolol chosen as it has less effect on BP and Neuro wanting permissive HTN due to recent CVA -will  get 24 hour urine for catecholamines to rule out pheo given marked tachycardia and hypertension with no know etiology   2.  Hypertensive urgency -BP improved on Clevidipine gtt (dihydropiridine CCB) -plan for gradual lowering of BP -plan to change to PO meds today  3.  Acute CVA -for TEE tomorrow with anesthesia -ESR and CRP elevated -vasculitis workup in progress  -continue DAPT with ASA and plavix -continue statin  4.  Acute Kidney injury -secondary to severe hypertension -Creatinine on admit was 2.2>2.54>2.26  5.  Elevated troponin -mildly elevated with flat trend -c/w demand ischemia in the setting of hypertensive urgency/CVA and tachycardia -2D echo pending      For questions or updates, please contact [place] Please consult [email or web address] for contact info under Cardiology/STEMI.      Signed, Fransico Him, MD  06/15/2019, 8:07 AM

## 2019-06-16 DIAGNOSIS — E1059 Type 1 diabetes mellitus with other circulatory complications: Secondary | ICD-10-CM

## 2019-06-16 DIAGNOSIS — I10 Essential (primary) hypertension: Secondary | ICD-10-CM

## 2019-06-16 DIAGNOSIS — I160 Hypertensive urgency: Secondary | ICD-10-CM

## 2019-06-16 LAB — BASIC METABOLIC PANEL
Anion gap: 9 (ref 5–15)
BUN: 20 mg/dL (ref 6–20)
CO2: 18 mmol/L — ABNORMAL LOW (ref 22–32)
Calcium: 8.5 mg/dL — ABNORMAL LOW (ref 8.9–10.3)
Chloride: 109 mmol/L (ref 98–111)
Creatinine, Ser: 2.59 mg/dL — ABNORMAL HIGH (ref 0.61–1.24)
GFR calc Af Amer: 36 mL/min — ABNORMAL LOW (ref >60)
GFR calc non Af Amer: 31 mL/min — ABNORMAL LOW (ref >60)
Glucose, Bld: 298 mg/dL — ABNORMAL HIGH (ref 70–99)
Potassium: 3.9 mmol/L (ref 3.5–5.1)
Sodium: 136 mmol/L (ref 135–145)

## 2019-06-16 LAB — CBC
HCT: 38.9 % — ABNORMAL LOW (ref 39.0–52.0)
Hemoglobin: 13.2 g/dL (ref 13.0–17.0)
MCH: 30.4 pg (ref 26.0–34.0)
MCHC: 33.9 g/dL (ref 30.0–36.0)
MCV: 89.6 fL (ref 80.0–100.0)
Platelets: 272 10*3/uL (ref 150–400)
RBC: 4.34 MIL/uL (ref 4.22–5.81)
RDW: 13.4 % (ref 11.5–15.5)
WBC: 13.9 10*3/uL — ABNORMAL HIGH (ref 4.0–10.5)
nRBC: 0 % (ref 0.0–0.2)

## 2019-06-16 LAB — LUPUS ANTICOAGULANT PANEL
DRVVT: 37.7 s (ref 0.0–47.0)
PTT Lupus Anticoagulant: 33.9 s (ref 0.0–51.9)

## 2019-06-16 LAB — GLUCOSE, CAPILLARY
Glucose-Capillary: 272 mg/dL — ABNORMAL HIGH (ref 70–99)
Glucose-Capillary: 346 mg/dL — ABNORMAL HIGH (ref 70–99)
Glucose-Capillary: 309 mg/dL — ABNORMAL HIGH (ref 70–99)
Glucose-Capillary: 272 mg/dL — ABNORMAL HIGH (ref 70–99)
Glucose-Capillary: 125 mg/dL — ABNORMAL HIGH (ref 70–99)

## 2019-06-16 LAB — MAGNESIUM: Magnesium: 1.7 mg/dL (ref 1.7–2.4)

## 2019-06-16 MED ORDER — INSULIN ASPART 100 UNIT/ML ~~LOC~~ SOLN: 0.0000 [IU] | Freq: Every day | SUBCUTANEOUS | Status: DC

## 2019-06-16 MED ORDER — LABETALOL HCL 5 MG/ML IV SOLN
5.0000 mg | INTRAVENOUS | Status: DC | PRN
  Administered 2019-06-16: 20 mg via INTRAVENOUS
  Administered 2019-06-16 – 2019-06-20 (×4): 10 mg via INTRAVENOUS
  Administered 2019-06-20: 20 mg via INTRAVENOUS
  Filled 2019-06-16 (×4): qty 4

## 2019-06-16 MED ORDER — INSULIN ASPART 100 UNIT/ML ~~LOC~~ SOLN
0.0000 [IU] | Freq: Three times a day (TID) | SUBCUTANEOUS | Status: DC
  Administered 2019-06-16: 5 [IU] via SUBCUTANEOUS
  Administered 2019-06-16: 7 [IU] via SUBCUTANEOUS

## 2019-06-16 MED ORDER — INSULIN ASPART 100 UNIT/ML ~~LOC~~ SOLN
3.0000 [IU] | Freq: Three times a day (TID) | SUBCUTANEOUS | Status: DC
  Administered 2019-06-16 (×2): 3 [IU] via SUBCUTANEOUS

## 2019-06-16 MED ORDER — INSULIN DETEMIR 100 UNIT/ML ~~LOC~~ SOLN
10.0000 [IU] | Freq: Every day | SUBCUTANEOUS | Status: DC
  Administered 2019-06-16: 10 [IU] via SUBCUTANEOUS
  Filled 2019-06-16 (×2): qty 0.1

## 2019-06-16 MED ORDER — METOPROLOL TARTRATE 50 MG PO TABS
50.0000 mg | ORAL_TABLET | Freq: Two times a day (BID) | ORAL | Status: DC
  Administered 2019-06-16 – 2019-06-17 (×3): 50 mg via ORAL
  Filled 2019-06-16 (×3): qty 1

## 2019-06-16 NOTE — Progress Notes (Addendum)
STROKE TEAM PROGRESS NOTE   INTERVAL HISTORY Pt sitting in bed, neuro intact, no complains. His BP still on the high end but off cleviprex. HR still high but off Esmolol drip, on po metoprolol. TEE pending  Vitals:   06/16/19 0700 06/16/19 0800 06/16/19 0900 06/16/19 1000  BP: (!) 149/92 (!) 159/84 (!) 160/100 (!) 167/94  Pulse: (!) 110 (!) 130 (!) 123 (!) 124  Resp: 20 (!) 24 (!) 22 15  Temp:  98.4 F (36.9 C)    TempSrc:  Oral    SpO2: 98% 98% 100% 99%  Weight:      Height:        CBC:  Recent Labs  Lab 06/12/19 2305  06/15/19 0158 06/16/19 0521  WBC 21.9*   < > 17.1* 13.9*  NEUTROABS 19.1*  --   --   --   HGB 17.1*   < > 14.1 13.2  HCT 48.2   < > 40.8 38.9*  MCV 85.9   < > 88.5 89.6  PLT 335   < > 257 272   < > = values in this interval not displayed.    Basic Metabolic Panel:  Recent Labs  Lab 06/14/19 0218 06/15/19 0158 06/16/19 0521  NA 140 143 136  K 3.6 3.7 3.9  CL 111 116* 109  CO2 22 19* 18*  GLUCOSE 105* 66* 298*  BUN 12 17 20   CREATININE 2.24* 2.26* 2.59*  CALCIUM 8.0* 8.6* 8.5*  MG 2.1 2.0 1.7  PHOS 3.7 4.4  --    Lipid Panel:     Component Value Date/Time   CHOL 215 (H) 06/13/2019 0251   TRIG 298 (H) 06/13/2019 0251   HDL 53 06/13/2019 0251   CHOLHDL 4.1 06/13/2019 0251   VLDL 60 (H) 06/13/2019 0251   LDLCALC 102 (H) 06/13/2019 0251   HgbA1c:  Lab Results  Component Value Date   HGBA1C 8.4 (H) 06/13/2019   Urine Drug Screen:     Component Value Date/Time   LABOPIA NONE DETECTED 06/13/2019 0423   COCAINSCRNUR NONE DETECTED 06/13/2019 0423   LABBENZ NONE DETECTED 06/13/2019 0423   AMPHETMU NONE DETECTED 06/13/2019 0423   THCU POSITIVE (A) 06/13/2019 0423   LABBARB NONE DETECTED 06/13/2019 0423    Alcohol Level     Component Value Date/Time   ETH <10 06/12/2019 2305    IMAGING Mr Jodene Nam Head Wo Contrast  Result Date: 06/14/2019 CLINICAL DATA:  33 y/o M; speech difficulty. Focal neuro deficit, > 6 hrs, stroke suspected Altered  level of consciousness (LOC), unexplained; Stroke, follow up. History of type 1 diabetes. EXAM: MRI HEAD WITHOUT CONTRAST MRA HEAD WITHOUT CONTRAST TECHNIQUE: Multiplanar, multiecho pulse sequences of the brain and surrounding structures were obtained without intravenous contrast. Angiographic images of the head were obtained using MRA technique without contrast. COMPARISON:  None. FINDINGS: MRI HEAD FINDINGS Brain: Large region of reduced diffusion within the left PCA territory involving medial temporal lobe, occipital lobe, and thalamus compatible with late acute/early subacute infarction. The distribution of the infarct is stable in comparison with prior CT of head given differences in technique. The infarct is T2 hyperintense and demonstrates mild cortical swelling. There is petechial hemorrhage without associated mass effect within the left occipital lobe, Heidelberg calssification 1a: HI1, scattered small petechiae, no mass effect. Vascular: Normal flow voids. Skull and upper cervical spine: Normal marrow signal. Sinuses/Orbits: Negative. Other: None. MRA HEAD FINDINGS Internal carotid arteries:  Patent. Anterior cerebral arteries:  Patent. Middle cerebral arteries: Patent.  Anterior communicating artery: Patent. Posterior communicating arteries:  Patent. Posterior cerebral arteries: Patent right. Patent proximal left. Severe stenosis of a large left posterior temporal artery branch origin (series 1013, image 3). Basilar artery:  Patent. Vertebral arteries:  Patent. IMPRESSION: MRI head: Large left PCA distribution late acute/early subacute infarction. Petechial hemorrhage in left occipital lobe, Heidelberg calssification 1a: HI1, scattered small petechiae, no mass effect. MRA head: 1. Severe stenosis of a large left posterior temporal artery branch of the left PCA. 2. No proximal large vessel occlusion, aneurysm, or high-grade stenosis. Electronically Signed   By: Kristine Garbe M.D.   On: 06/14/2019  19:39   Mr Brain Wo Contrast  Result Date: 06/14/2019 CLINICAL DATA:  33 y/o M; speech difficulty. Focal neuro deficit, > 6 hrs, stroke suspected Altered level of consciousness (LOC), unexplained; Stroke, follow up. History of type 1 diabetes. EXAM: MRI HEAD WITHOUT CONTRAST MRA HEAD WITHOUT CONTRAST TECHNIQUE: Multiplanar, multiecho pulse sequences of the brain and surrounding structures were obtained without intravenous contrast. Angiographic images of the head were obtained using MRA technique without contrast. COMPARISON:  None. FINDINGS: MRI HEAD FINDINGS Brain: Large region of reduced diffusion within the left PCA territory involving medial temporal lobe, occipital lobe, and thalamus compatible with late acute/early subacute infarction. The distribution of the infarct is stable in comparison with prior CT of head given differences in technique. The infarct is T2 hyperintense and demonstrates mild cortical swelling. There is petechial hemorrhage without associated mass effect within the left occipital lobe, Heidelberg calssification 1a: HI1, scattered small petechiae, no mass effect. Vascular: Normal flow voids. Skull and upper cervical spine: Normal marrow signal. Sinuses/Orbits: Negative. Other: None. MRA HEAD FINDINGS Internal carotid arteries:  Patent. Anterior cerebral arteries:  Patent. Middle cerebral arteries: Patent. Anterior communicating artery: Patent. Posterior communicating arteries:  Patent. Posterior cerebral arteries: Patent right. Patent proximal left. Severe stenosis of a large left posterior temporal artery branch origin (series 1013, image 3). Basilar artery:  Patent. Vertebral arteries:  Patent. IMPRESSION: MRI head: Large left PCA distribution late acute/early subacute infarction. Petechial hemorrhage in left occipital lobe, Heidelberg calssification 1a: HI1, scattered small petechiae, no mass effect. MRA head: 1. Severe stenosis of a large left posterior temporal artery branch of the  left PCA. 2. No proximal large vessel occlusion, aneurysm, or high-grade stenosis. Electronically Signed   By: Kristine Garbe M.D.   On: 06/14/2019 19:39   Dg Chest Port 1 View  Result Date: 06/15/2019 CLINICAL DATA:  Respiratory failure EXAM: PORTABLE CHEST 1 VIEW COMPARISON:  June 13, 2019 FINDINGS: The heart size and mediastinal contours are within normal limits. Both lungs are clear. The visualized skeletal structures are unremarkable. IMPRESSION: No active disease. Electronically Signed   By: Dorise Bullion III M.D   On: 06/15/2019 08:33    PHYSICAL EXAM Temp:  [98.2 F (36.8 C)-98.5 F (36.9 C)] 98.4 F (36.9 C) (06/18 0800) Pulse Rate:  [94-130] 124 (06/18 1000) Resp:  [15-28] 15 (06/18 1000) BP: (131-169)/(84-110) 167/94 (06/18 1000) SpO2:  [98 %-100 %] 99 % (06/18 1000) Weight:  [50.1 kg] 50.1 kg (06/18 0500)  General - Well nourished, well developed, not in acute distress  Ophthalmologic - fundi not visualized due to noncooperation.  Cardiovascular - Regular rhythm, but mild tachycardia.  Mental Status -  Level of arousal and orientation to time, place, and person were intact. Language including expression, naming, repetition, comprehension was assessed and found intact. Fund of Knowledge was assessed and was intact.  Cranial Nerves II -  XII - II - Visual field intact OU, no hemianopia but right visual field decreased acuity without gross hemianopia. III, IV, VI - Extraocular movements intact. V - Facial sensation intact bilaterally. VII - Facial movement intact bilaterally. VIII - Hearing & vestibular intact bilaterally. X - Palate elevates symmetrically. XI - Chin turning & shoulder shrug intact bilaterally. XII - Tongue protrusion intact.  Motor Strength - The patient's strength was normal in all extremities and pronator drift was absent.  Bulk was normal and fasciculations were absent.   Motor Tone - Muscle tone was assessed at the neck and appendages  and was normal.  Reflexes - The patient's reflexes were symmetrical in all extremities and he had no pathological reflexes.  Sensory - Light touch, temperature/pinprick were assessed and were symmetrical.    Coordination - The patient had normal movements in the hands and feet with no ataxia or dysmetria.  Tremor was absent.  Gait and Station - deferred.   ASSESSMENT/PLAN [name] is a 33 y.o. male with history of type I DB presenting with receptive aphasia and word salad responses, not oriented with HA.   Stroke: Large L PCA infarct, suspect embolic source, workup underway  CT head 6/14 2152 large L PCA infarct  MRI  Large L PCA infarct w/ petechial hmg L occipital lobe HI1  MRA  Severe stenosis L PCA  Carotid Doppler unremarkable  2D Echo EF 50-55%  LE venous dopplers no DVT  TEE given severe leukocytosis and stroke in young age rescheduled for tomorrow  hypercoagulable/vasculitis neg  LDL 102  HgbA1c 8.4  Lovenox 40 mg sq daily for VTE prophylaxis  No antithrombotic prior to admission, now on aspirin 325 mg and plavix daily.  Therapy recommendations:  Outpt PT/OT  Disposition:  pending   Extreme Agitation, resolved  Required mult meds for sedation including precedex  Restrained, now off  CCM managing  Off precedex  Hypertension/Tachycardia  No hx HTN. Not on home meds  SBP goal < 160  Long term BP goal normotensive  BP improved, Mild tachycardia  Off esmolol and cleviprex  On amlodipine 10 mg and increase metoprolol to 50 bid  Labetalol PRN  Malignant HTN work up pending  CKD, stage III  No previous Cre to compare  Cr 2.23->2.54->2.24->2.26->2.59  Urine protein large  Encourage po intake  Hyperlipidemia  Home meds:  No statin  LDL 102, goal < 70  On lipitor 40  Continue statin at discharge  Diabetes type I Uncontrolled Hypoglycemic episode  HgbA1c 8.4, goal < 7.0  CBGs  SSI  Still hyperglycemia, with  hypoglycemia episode  On Levemir  On diet  DM coordinator on board  Leukocytosis  WBC 21.9->35.6->14.2->17.1->13.9  Likely reactive  Lactic acid 2.5-> 6.5->1.7  CXR no active disease  UA negative  Blood culture no growth 2 days  Afebrile  Other Stroke Risk Factors  ETOH use, advised to drink no more than 2 drink(s) a day  THC use - UDS positive for THC this admission   Other [facility] day # 3  This patient is critically ill due to left PCA stroke, extreme agitation, DM type I, severe leukocytosis, AK on CKD and at significant risk of neurological worsening, death form recurrent stroke, hemorrhagic conversion, DKA, sepsis, renal failure. This patient's care requires constant monitoring of vital signs, hemodynamics, respiratory and cardiac monitoring, review of multiple databases, neurological assessment, discussion with family, other specialists and medical decision making of high complexity. I  spent 30 minutes of neurocritical care time in the care of this patient.   Rosalin Hawking, MD PhD Stroke Neurology 06/16/2019 11:01 AM   To contact Stroke Continuity provider, please refer to [email or web address] After hours, contact General Neurology

## 2019-06-16 NOTE — Progress Notes (Signed)
Attempted to call report to 6E. Nurse just received a new patient. Will call back. Thayer Ohm D

## 2019-06-16 NOTE — Progress Notes (Signed)
Progress Note  Patient Name: George [name] Date of Encounter: 06/16/2019  Primary Cardiologist: No primary care provider on file.   Subjective   Now off IV CCB and on Amlodipine 25m daily along with Lopressor 262mBID but BP is still elevated at 159/8414m and HR still elevated at 110-130bpm.   Inpatient Medications    Scheduled Meds:  amLODipine  10 mg Oral Daily   aspirin EC  325 mg Oral Daily   atorvastatin  40 mg Oral q1800   Chlorhexidine Gluconate Cloth  6 each Topical Daily   clopidogrel  75 mg Oral Daily   enoxaparin (LOVENOX) injection  40 mg Subcutaneous Q24H   insulin aspart  0-9 Units Subcutaneous Q4H   metoprolol tartrate  25 mg Oral BID   Continuous Infusions:  sodium chloride Stopped (06/13/19 1639)   clevidipine 4 mg/hr (06/15/19 1127)   dextrose 5 % and 0.9% NaCl Stopped (06/14/19 1002)   esmolol Stopped (06/16/19 0416)   PRN Meds: sodium chloride, acetaminophen **OR** acetaminophen (TYLENOL) oral liquid 160 mg/5 mL **OR** acetaminophen, fentaNYL (SUBLIMAZE) injection, hydrALAZINE, LORazepam, metoprolol tartrate, senna-docusate   Vital Signs    Vitals:   06/16/19 0500 06/16/19 0600 06/16/19 0700 06/16/19 0800  BP: (!) 148/92 (!) 157/92 (!) 149/92 (!) 159/84  Pulse: (!) 111 (!) 109 (!) 110 (!) 130  Resp: '17 17 20 ' (!) 24  Temp:      TempSrc:      SpO2: 99% 98% 98% 98%  Weight: 50.1 kg     Height:        Intake/Output Summary (Last 24 hours) at 06/16/2019 [id number] data filed at 06/16/2019 0600 Gross per 24 hour  Intake 1145.67 ml  Output 2730 ml  Net -1584.33 ml   Filed Weights   06/12/19 2128 06/16/19 0500  Weight: 56.7 kg 50.1 kg    Telemetry    Sinus tachycardia - Personally Reviewed  ECG    No new EKG to review - Personally Reviewed  Physical Exam   GEN: No acute distress.   Neck: No JVD Cardiac: RRR, no murmurs, rubs, or gallops.  Respiratory: Clear to auscultation bilaterally. GI: Soft, nontender,  non-distended  MS: No edema; No deformity. Neuro:  Nonfocal  Psych: Normal affect   Labs    Chemistry Recent Labs  Lab 06/12/19 2305  06/14/19 0218 06/15/19 0158 06/16/19 0521  NA 134*   < > 140 143 136  K 3.2*   < > 3.6 3.7 3.9  CL 98   < > 111 116* 109  CO2 23   < > 22 19* 18*  GLUCOSE 132*   < > 105* 66* 298*  BUN 14   < > '12 17 20  ' CREATININE 2.23*   < > 2.24* 2.26* 2.59*  CALCIUM 9.6   < > 8.0* 8.6* 8.5*  PROT 7.2  --   --   --   --   ALBUMIN 3.7  --   --   --   --   AST 22  --   --   --   --   ALT 16  --   --   --   --   ALKPHOS 128*  --   --   --   --   BILITOT 0.4  --   --   --   --   GFRNONAA 38*   < > 37* 37* 31*  GFRAA 44*   < > 43* 43* 36*  ANIONGAP 13   < >  '7 8 9   ' < > = values in this interval not displayed.     Hematology Recent Labs  Lab 06/14/19 0218 06/15/19 0158 06/16/19 0521  WBC 14.2* 17.1* 13.9*  RBC 3.81* 4.61 4.34  HGB 11.5* 14.1 13.2  HCT 33.7* 40.8 38.9*  MCV 88.5 88.5 89.6  MCH 30.2 30.6 30.4  MCHC 34.1 34.6 33.9  RDW 13.3 13.4 13.4  PLT 196 257 272    Cardiac Enzymes Recent Labs  Lab 06/14/19 1446 06/14/19 1925 06/15/19 0158  TROPONINI 0.24* 0.28* 0.34*   No results for input(s): TROPIPOC in the last 168 hours.   BNPNo results for input(s): BNP, PROBNP in the last 168 hours.   DDimer No results for input(s): DDIMER in the last 168 hours.   Radiology    Mr Jodene Nam Head Wo Contrast  Result Date: 06/14/2019 CLINICAL DATA:  33 y/o M; speech difficulty. Focal neuro deficit, > 6 hrs, stroke suspected Altered level of consciousness (LOC), unexplained; Stroke, follow up. History of type 1 diabetes. EXAM: MRI HEAD WITHOUT CONTRAST MRA HEAD WITHOUT CONTRAST TECHNIQUE: Multiplanar, multiecho pulse sequences of the brain and surrounding structures were obtained without intravenous contrast. Angiographic images of the head were obtained using MRA technique without contrast. COMPARISON:  None. FINDINGS: MRI HEAD FINDINGS Brain: Large  region of reduced diffusion within the left PCA territory involving medial temporal lobe, occipital lobe, and thalamus compatible with late acute/early subacute infarction. The distribution of the infarct is stable in comparison with prior CT of head given differences in technique. The infarct is T2 hyperintense and demonstrates mild cortical swelling. There is petechial hemorrhage without associated mass effect within the left occipital lobe, Heidelberg calssification 1a: HI1, scattered small petechiae, no mass effect. Vascular: Normal flow voids. Skull and upper cervical spine: Normal marrow signal. Sinuses/Orbits: Negative. Other: None. MRA HEAD FINDINGS Internal carotid arteries:  Patent. Anterior cerebral arteries:  Patent. Middle cerebral arteries: Patent. Anterior communicating artery: Patent. Posterior communicating arteries:  Patent. Posterior cerebral arteries: Patent right. Patent proximal left. Severe stenosis of a large left posterior temporal artery branch origin (series 1013, image 3). Basilar artery:  Patent. Vertebral arteries:  Patent. IMPRESSION: MRI head: Large left PCA distribution late acute/early subacute infarction. Petechial hemorrhage in left occipital lobe, Heidelberg calssification 1a: HI1, scattered small petechiae, no mass effect. MRA head: 1. Severe stenosis of a large left posterior temporal artery branch of the left PCA. 2. No proximal large vessel occlusion, aneurysm, or high-grade stenosis. Electronically Signed   By: Kristine Garbe M.D.   On: 06/14/2019 19:39   Mr Brain Wo Contrast  Result Date: 06/14/2019 CLINICAL DATA:  33 y/o M; speech difficulty. Focal neuro deficit, > 6 hrs, stroke suspected Altered level of consciousness (LOC), unexplained; Stroke, follow up. History of type 1 diabetes. EXAM: MRI HEAD WITHOUT CONTRAST MRA HEAD WITHOUT CONTRAST TECHNIQUE: Multiplanar, multiecho pulse sequences of the brain and surrounding structures were obtained without  intravenous contrast. Angiographic images of the head were obtained using MRA technique without contrast. COMPARISON:  None. FINDINGS: MRI HEAD FINDINGS Brain: Large region of reduced diffusion within the left PCA territory involving medial temporal lobe, occipital lobe, and thalamus compatible with late acute/early subacute infarction. The distribution of the infarct is stable in comparison with prior CT of head given differences in technique. The infarct is T2 hyperintense and demonstrates mild cortical swelling. There is petechial hemorrhage without associated mass effect within the left occipital lobe, Heidelberg calssification 1a: HI1, scattered small petechiae, no mass effect.  Vascular: Normal flow voids. Skull and upper cervical spine: Normal marrow signal. Sinuses/Orbits: Negative. Other: None. MRA HEAD FINDINGS Internal carotid arteries:  Patent. Anterior cerebral arteries:  Patent. Middle cerebral arteries: Patent. Anterior communicating artery: Patent. Posterior communicating arteries:  Patent. Posterior cerebral arteries: Patent right. Patent proximal left. Severe stenosis of a large left posterior temporal artery branch origin (series 1013, image 3). Basilar artery:  Patent. Vertebral arteries:  Patent. IMPRESSION: MRI head: Large left PCA distribution late acute/early subacute infarction. Petechial hemorrhage in left occipital lobe, Heidelberg calssification 1a: HI1, scattered small petechiae, no mass effect. MRA head: 1. Severe stenosis of a large left posterior temporal artery branch of the left PCA. 2. No proximal large vessel occlusion, aneurysm, or high-grade stenosis. Electronically Signed   By: Kristine Garbe M.D.   On: 06/14/2019 19:39   Dg Chest Port 1 View  Result Date: 06/15/2019 CLINICAL DATA:  Respiratory failure EXAM: PORTABLE CHEST 1 VIEW COMPARISON:  June 13, 2019 FINDINGS: The heart size and mediastinal contours are within normal limits. Both lungs are clear. The  visualized skeletal structures are unremarkable. IMPRESSION: No active disease. Electronically Signed   By: Dorise Bullion III M.D   On: 06/15/2019 08:33    Cardiac Studies   2D echo 06/15/2019 IMPRESSIONS   1. The left ventricle has low normal systolic function, with an ejection fraction of 50-55%. The cavity size was normal. Indeterminate diastolic filling due to E-A fusion.  2. The right ventricle has normal systolic function. The cavity was normal. There is no increase in right ventricular wall thickness.  3. Atrial septal color Doppler challenging to interpreter. Cannot exclude PFO though no definite L to R shunt detected. Consider transthoracic agitated saline exam.  4. No intracardiac thrombi or masses were visualized.  Patient Profile     33 y.o. male history of Dm1 admitted with slurred speech and balance issues at home along with headache. Found by neurology to have receptive aphasia and diagnosed with left occipital temoral lobse CVA, outside of tpa window  Later during admission became severely agitated, given several rounds of ativa, haldol. Significant HTN and tachycardia has been an ongoing issue even with resolution of agitation. Cardiology consulted to help manage.  Assessment & Plan    1.  Severe tachycardia -initial EKG show sinus tach 120s-150s, as well as a narrow complex regular tach 170s - tele reviewed, severe narrow complex tach 170s that looks to be sinus tach. Following the rhythms you can see where sinus tach speeds up and slow downs, there is no abrupt onset as would be seen with an SVT.  - unclear etiology, perhaps sympathetic hyperactivity/autonomic instability after his CVA. -His UDS was negative and he denies any EtOH use and has no other signs of EtOH withdrawal.  -no signicant anemia, normal TSH, not hypoxic, comfortable not in pain, not currently agitated. Nothing to support tachy from PE or pericardial effusion. Essentially looks for some reason to have  autonomic/sympathetic surge. -started on Esmolol gtt last night with improvement in HR -Esmolol chosen as it has less effect on BP and Neuro wanting permissive HTN due to recent CVA -now off Esmolol and on Lopressor -24 hour urine for catecholamines pending to rule out pheo given marked tachycardia and hypertension with no know etiology  -HR in the 110-130's so will increase Lopressor to 30m BID  2.  Hypertensive urgency -BP improved but remains elevated  -Clevidipine gtt (dihydropiridine CCB) stopped and now on amlodipine 128mdaily and lopressor 2573mID -  will increase Lopressor to 80m q6 hours due to persistently elevated BP and HR  3.  Acute CVA -for TEE tomorrow with anesthesia -ESR and CRP elevated -vasculitis workup in progress  -possible PFO on TTE - will need bubble study today with TEE -continue DAPT with ASA and plavix -continue statin  4.  Acute Kidney injury -secondary to severe hypertension -Creatinine on admit was 2.2>2.54>2.26>2.59  5.  Elevated troponin -mildly elevated with flat trend -c/w demand ischemia in the setting of hypertensive urgency/CVA and tachycardia -2D echo with normal LVF    For questions or updates, please contact CNew MindenPlease consult [email or web address] for contact info under Cardiology/STEMI.      Signed, TFransico Him MD  06/16/2019, 8:22 AM

## 2019-06-16 NOTE — Progress Notes (Signed)
NAMEBow Buntyn, MRN:  [id number], DOB:  [birth year], LOS: 3 ADMISSION DATE:  06/12/2019, CONSULTATION DATE: 06/12/2019 REFERRING MD:  Dr. Johnney Killian, ER, CHIEF COMPLAINT:  Headache   Brief History   33 yo male presented to ER with speech difficulty with receptive aphasia, headache, and unsteady gait.  Found to have Large Lt PCA infarct likely from embolic source.  UDS positive for THC.  Past Medical History  DM type I  Significant Hospital Events   6/14 Admit, start cleviprex 6/15 start precedex 6/16 off precedex, transient tachycardia/HTN >> start esmolol 6/18 off cleviprex and esmolol  Consults:  Neurology 6/14 Aphasia Cardiology 6/16 tachycardia  Procedures:    Significant Diagnostic Tests:  CT head 6/14 >> large Lt PCA infarct MRI/MRA brain 6/16 >> large Lt PCA infarct, severe stenosis Lt posterior temporal artery branch of Lt PCA Echo 6/17 >> EF 50 to 55%, can't exclude PFO but no definite Lt to Rt shunt  Micro Data:  COVID 6/15 >> negative Blood 6/15 >>  Antimicrobials:    Interim history/subjective:  Denies headache, chest pain, dyspnea.  Objective   Blood pressure (!) 149/92, pulse (!) 110, temperature 98.2 F (36.8 C), temperature source Oral, resp. rate 20, height 5\' 5"  (1.651 m), weight 50.1 kg, SpO2 98 %.        Intake/Output Summary (Last 24 hours) at 06/16/2019 0800 Last data filed at 06/16/2019 0600 Gross per 24 hour  Intake 1145.67 ml  Output 2730 ml  Net -1584.33 ml   Filed Weights   06/12/19 2128 06/16/19 0500  Weight: 56.7 kg 50.1 kg    Examination:  General - alert Eyes - pupils reactive ENT - no sinus tenderness, no stridor Cardiac - regular, tachycardic Chest - equal breath sounds b/l, no wheezing or rales Abdomen - soft, non tender, + bowel sounds Extremities - no cyanosis, clubbing, or edema Skin - no rashes Neuro - normal strength, moves extremities, follows commands   Resolved Hospital Problem list   Delirium   Assessment & Plan:   Lt PCA infarct, suspect embolic source. Plan - TEE tentatively scheduled for 6/19 - neurology following - continue ASA, plavix  Tachycardia. HTN. HLD. Plan - goal SBP < 160  - cardiology consulted - continue lipitor  DM type I. Plan  - SSI - add levemir  CKD 2 >> unsure what baseline renal fx is. Plan - monitor renal fx, urine outpt  Best practice:  Diet: Carb modified DVT prophylaxis: Lovenox GI prophylaxis: Not indicated Mobility: OOB Code Status: full code Disposition: telemetry.  Will ask [facility] to assume care from 6/19 and PCCM off.  Labs    CMP Latest Ref Rng & Units 06/16/2019 06/15/2019 06/14/2019  Glucose 70 - 99 mg/dL 298(H) 66(L) 105(H)  BUN 6 - 20 mg/dL 20 17 12   Creatinine 0.61 - 1.24 mg/dL 2.59(H) 2.26(H) 2.24(H)  Sodium 135 - 145 mmol/L 136 143 140  Potassium 3.5 - 5.1 mmol/L 3.9 3.7 3.6  Chloride 98 - 111 mmol/L 109 116(H) 111  CO2 22 - 32 mmol/L 18(L) 19(L) 22  Calcium 8.9 - 10.3 mg/dL 8.5(L) 8.6(L) 8.0(L)  Total Protein 6.5 - 8.1 g/dL - - -  Total Bilirubin 0.3 - 1.2 mg/dL - - -  Alkaline Phos 38 - 126 U/L - - -  AST 15 - 41 U/L - - -  ALT 0 - 44 U/L - - -   CBC Latest Ref Rng & Units 06/16/2019 06/15/2019 06/14/2019  WBC 4.0 - 10.5 K/uL  13.9(H) 17.1(H) 14.2(H)  Hemoglobin 13.0 - 17.0 g/dL 13.2 14.1 11.5(L)  Hematocrit 39.0 - 52.0 % 38.9(L) 40.8 33.7(L)  Platelets 150 - 400 K/uL 272 257 196   CBG (last 3)  Recent Labs    06/15/19 2005 06/15/19 2351 06/16/19 0345  GLUCAP Branson, MD [facility] 06/16/2019, 8:00 AM

## 2019-06-16 NOTE — Progress Notes (Signed)
Inpatient Diabetes Program Recommendations  AACE/ADA: New Consensus Statement on Inpatient Glycemic Control (2015)  Target Ranges:  Prepandial:   less than 140 mg/dL      Peak postprandial:   less than 180 mg/dL (1-2 hours)      Critically ill patients:  140 - 180 mg/dL   Results for GARLAND, HINCAPIE (MRN [id number]) as of 06/16/2019 13:00  Ref. Range 06/15/2019 08:19 06/15/2019 12:14 06/15/2019 15:39 06/15/2019 20:05 06/15/2019 23:51 06/16/2019 03:45 06/16/2019 08:21 06/16/2019 11:59  Glucose-Capillary Latest Ref Range: 70 - 99 mg/dL 239 (H) 310 (H) 238 (H) 320 (H) 267 (H) 272 (H) 346 (H) 309 (H)    Review of Glycemic Control  Diabetes history: Type 1 DM Outpatient Diabetes medications: NPH 30 units QD & Novolin R 4-5 units TID Current orders for Inpatient glycemic control: Levemir 10 units Daily, Novolog 3 units tid meal coverage, Novolog 0-9 units tid + Novolog 0-5 units qhs  Inpatient Diabetes Program Recommendations:    Noted Levemir given later at 11 am this morning glucose 309 at lunch time.   Consider the following:  -increasing Levemir 10 units to bid (almost 0.2 units/kg)  In preparation for discharge, patient could benefit from BID dosing of NPH (as action duration is ~12 hours).  Thanks, Tama Headings RN, MSN, BC-ADM Inpatient Diabetes Coordinator Team Pager [phone] (8a-5p)

## 2019-06-16 NOTE — Progress Notes (Signed)
Systolic BP 069 despite administration of hydralazine. I notified Dr. Halford Chessman. PO BP meds given. I will reassess to see if BP comes down. Thayer Ohm D

## 2019-06-16 NOTE — Progress Notes (Signed)
Patient transferred by wheelchair to room 6E10. He was placed on the monitor and oriented to the room. Belongs placed at bedside. Thayer Ohm D

## 2019-06-17 ENCOUNTER — Inpatient Hospital Stay (HOSPITAL_COMMUNITY): Payer: Medicaid Other

## 2019-06-17 LAB — BASIC METABOLIC PANEL
Anion gap: 10 (ref 5–15)
BUN: 23 mg/dL — ABNORMAL HIGH (ref 6–20)
CO2: 21 mmol/L — ABNORMAL LOW (ref 22–32)
Calcium: 8.5 mg/dL — ABNORMAL LOW (ref 8.9–10.3)
Chloride: 107 mmol/L (ref 98–111)
Creatinine, Ser: 2.46 mg/dL — ABNORMAL HIGH (ref 0.61–1.24)
GFR calc Af Amer: 39 mL/min — ABNORMAL LOW (ref >60)
GFR calc non Af Amer: 33 mL/min — ABNORMAL LOW (ref >60)
Glucose, Bld: 400 mg/dL — ABNORMAL HIGH (ref 70–99)
Potassium: 4.6 mmol/L (ref 3.5–5.1)
Sodium: 138 mmol/L (ref 135–145)

## 2019-06-17 LAB — GLUCOSE, CAPILLARY
Glucose-Capillary: 470 mg/dL — ABNORMAL HIGH (ref 70–99)
Glucose-Capillary: 543 mg/dL (ref 70–99)
Glucose-Capillary: 462 mg/dL — ABNORMAL HIGH (ref 70–99)
Glucose-Capillary: 385 mg/dL — ABNORMAL HIGH (ref 70–99)
Glucose-Capillary: 303 mg/dL — ABNORMAL HIGH (ref 70–99)
Glucose-Capillary: 242 mg/dL — ABNORMAL HIGH (ref 70–99)
Glucose-Capillary: 188 mg/dL — ABNORMAL HIGH (ref 70–99)
Glucose-Capillary: 129 mg/dL — ABNORMAL HIGH (ref 70–99)
Glucose-Capillary: 79 mg/dL (ref 70–99)
Glucose-Capillary: 121 mg/dL — ABNORMAL HIGH (ref 70–99)
Glucose-Capillary: 238 mg/dL — ABNORMAL HIGH (ref 70–99)

## 2019-06-17 LAB — GLUCOSE, RANDOM: Glucose, Bld: 399 mg/dL — ABNORMAL HIGH (ref 70–99)

## 2019-06-17 MED ORDER — PROPOFOL 500 MG/50ML IV EMUL: INTRAVENOUS | Status: DC | PRN

## 2019-06-17 MED ORDER — LIDOCAINE 2% (20 MG/ML) 5 ML SYRINGE: INTRAMUSCULAR | Status: DC | PRN

## 2019-06-17 MED ORDER — SODIUM CHLORIDE 0.9 % IV SOLN
INTRAVENOUS | Status: DC
  Administered 2019-06-17 – 2019-06-18 (×2): via INTRAVENOUS

## 2019-06-17 MED ORDER — DEXTROSE-NACL 5-0.45 % IV SOLN
INTRAVENOUS | Status: DC
  Administered 2019-06-17: 18:00:00 via INTRAVENOUS

## 2019-06-17 MED ORDER — PROPOFOL 10 MG/ML IV BOLUS: INTRAVENOUS | Status: DC | PRN

## 2019-06-17 MED ORDER — INSULIN ASPART 100 UNIT/ML ~~LOC~~ SOLN
0.0000 [IU] | SUBCUTANEOUS | Status: DC
  Administered 2019-06-17: 3 [IU] via SUBCUTANEOUS
  Administered 2019-06-18: 9 [IU] via SUBCUTANEOUS

## 2019-06-17 MED ORDER — INSULIN REGULAR(HUMAN) IN NACL 100-0.9 UT/100ML-% IV SOLN
INTRAVENOUS | Status: DC
  Administered 2019-06-17: 4.8 [IU]/h via INTRAVENOUS
  Filled 2019-06-17: qty 100

## 2019-06-17 NOTE — Progress Notes (Signed)
Spoke with Diabetes Coordinator regarding pt CBG of 470. Suggested to re-page MD, pt may need insulin drip for possible DKA. Per Diabetes Coordinator hold sliding scale insulin. MD re-paged. Pt is asymptomatic and resting comfortably in his room.

## 2019-06-17 NOTE — Progress Notes (Signed)
Inpatient Diabetes Program Recommendations  AACE/ADA: New Consensus Statement on Inpatient Glycemic Control (2015)  Target Ranges:  Prepandial:   less than 140 mg/dL      Peak postprandial:   less than 180 mg/dL (1-2 hours)      Critically ill patients:  140 - 180 mg/dL   Results for George [name], George [name] (MRN [id number]) as of 06/17/2019 08:27  Ref. Range 06/16/2019 08:21 06/16/2019 11:59 06/16/2019 16:41 06/16/2019 22:34 06/17/2019 07:59  Glucose-Capillary Latest Ref Range: 70 - 99 mg/dL 346 (H) 309 (H) 272 (H) 125 (H) 470 (H)   Review of Glycemic Control  Diabetes history: Type 1 DM Outpatient Diabetes medications: NPH 30 units QD & Novolin R 4-5 units TID Current orders for Inpatient glycemic control: Levemir 10 units Daily, Novolog 3 units tid meal coverage, Novolog 0-9 units tid + Novolog 0-5 units qhs  Inpatient Diabetes Program Recommendations:    Noted Levemir 10 units Daily given later at 11 am yesterday morning. Glucose 470 this am.   Consider the following:  Consider STAT BMET. Patient will most likely need IV insulin. CO2  On 5 am labs 21. RN calling MD.  -will need to increase Levemir 12 units to bid, closer to home dose.  In preparation for discharge, patient would need BID dosing of NPH for 24 hour coverage (as action duration is ~12 hours).  Thanks, Tama Headings RN, MSN, BC-ADM Inpatient Diabetes Coordinator Team Pager [phone] (8a-5p)

## 2019-06-17 NOTE — Progress Notes (Signed)
NAMEChristine [name], MRN:  [id number], DOB:  1986/09/06, LOS: 4 ADMISSION DATE:  06/12/2019, CONSULTATION DATE: 06/12/2019 REFERRING MD:  Dr. Johnney Killian, ER, CHIEF COMPLAINT:  Headache   Brief History   33 y/o male presented to ER with speech difficulty with receptive aphasia, headache, and unsteady gait.  Found to have Large Lt PCA infarct likely from embolic source.  UDS positive for THC.  Past Medical History  DM type I Tobacco Abuse  THC Abuse   Significant Hospital Events   6/14 Admit, start cleviprex 6/15 start precedex 6/16 off precedex, transient tachycardia/HTN >> start esmolol 6/18 off cleviprex and esmolol 6/19 Hyperglycemia on insulin gtt  Consults:  Neurology 6/14 Aphasia Cardiology 6/16 tachycardia  Procedures:    Significant Diagnostic Tests:  CT head 6/14 >> large Lt PCA infarct MRI/MRA brain 6/16 >> large Lt PCA infarct, severe stenosis Lt posterior temporal artery branch of Lt PCA Echo 6/17 >> EF 50 to 55%, can't exclude PFO but no definite Lt to Rt shunt  Micro Data:  COVID 6/15 >> negative Blood 6/15 >>  Antimicrobials:    Interim history/subjective:  Pt reports feeling poorly when glucose was >500, felt lethargic "awful".  Feels better now that glucose down to 300's.    Objective   Blood pressure (!) 176/105, pulse 95, temperature 98.7 F (37.1 C), temperature source Oral, resp. rate 20, height 5\' 3"  (1.6 m), weight 50.3 kg, SpO2 100 %.        Intake/Output Summary (Last 24 hours) at 06/17/2019 1343 Last data filed at 06/16/2019 1900 Gross per 24 hour  Intake 240 ml  Output 600 ml  Net -360 ml   Filed Weights   06/16/19 0500 06/16/19 1421 06/17/19 0533  Weight: 50.1 kg 51 kg 50.3 kg    Examination: General: young adult male sitting up in chair  HEENT: MM pink/moist, fair dentition  Neuro: AAOx4, speech clear, MAE CV: s1s2 rrr, no m/r/g PULM: even/non-labored, lungs bilaterally  MV:[facility], non-tender, bsx4 active  Extremities:  warm/dry, no edema  Skin: no rashes or lesions  Resolved Hospital Problem list   Delirium  Assessment & Plan:   Lt PCA infarct, suspect embolic source. P: TEE planned for 6/19 Appreciate Neurology assistance  Continue ASA, Plavix   Tachycardia. HTN. HLD. P: SBP goal <160 Cardiology following, appreciate input  Continue Lipitor  Await TEE findings  DM type I. P: Insulin gtt   CKD 2 >> no clear baseline renal fx, last in Care Everywhere 0.8 in 2003 P: Trend BMP / urinary output Replace electrolytes as indicated Avoid nephrotoxic agents, ensure adequate renal perfusion Needs close outpatient follow up  Tobacco Abuse  P: Smoking cessation counseling   Best practice:  Diet: Carb modified DVT prophylaxis: Lovenox GI prophylaxis: Not indicated Mobility: OOB Code Status: full code Disposition: Tele, To [place] 6/20.   Labs    CMP Latest Ref Rng & Units 06/17/2019 06/17/2019 06/16/2019  Glucose 70 - 99 mg/dL 399(H) 400(H) 298(H)  BUN 6 - 20 mg/dL - 23(H) 20  Creatinine 0.61 - 1.24 mg/dL - 2.46(H) 2.59(H)  Sodium 135 - 145 mmol/L - 138 136  Potassium 3.5 - 5.1 mmol/L - 4.6 3.9  Chloride 98 - 111 mmol/L - 107 109  CO2 22 - 32 mmol/L - 21(L) 18(L)  Calcium 8.9 - 10.3 mg/dL - 8.5(L) 8.5(L)  Total Protein 6.5 - 8.1 g/dL - - -  Total Bilirubin 0.3 - 1.2 mg/dL - - -  Alkaline Phos 38 - 126  U/L - - -  AST 15 - 41 U/L - - -  ALT 0 - 44 U/L - - -   CBC Latest Ref Rng & Units 06/16/2019 06/15/2019 06/14/2019  WBC 4.0 - 10.5 K/uL 13.9(H) 17.1(H) 14.2(H)  Hemoglobin 13.0 - 17.0 g/dL 13.2 14.1 11.5(L)  Hematocrit 39.0 - 52.0 % 38.9(L) 40.8 33.7(L)  Platelets 150 - 400 K/uL 272 257 196   CBG (last 3)  Recent Labs    06/17/19 1111 06/17/19 1219 06/17/19 [place], NP-C [facility] Pgr: [phone] or if no answer [phone] 06/17/2019, 1:44 PM

## 2019-06-17 NOTE — Progress Notes (Signed)
STROKE TEAM PROGRESS NOTE   INTERVAL HISTORY Pt sitting in chair, eager to go home.  Early morning has spike of glucose, currently put on insulin drip.  Heart rate much improved, 90s during rounds, still on metoprolol.  TEE pending  Vitals:   06/16/19 1421 06/16/19 2105 06/17/19 0009 06/17/19 0533  BP: (!) 148/95 (!) 172/100 (!) 173/100 (!) 176/105  Pulse: (!) 116 97 93 95  Resp: 20     Temp: 98.6 F (37 C) 98.3 F (36.8 C) 98.4 F (36.9 C) 98.7 F (37.1 C)  TempSrc: Oral Oral  Oral  SpO2: 100% 100% 100% 100%  Weight: 51 kg   50.3 kg  Height: 5\' 3"  (1.6 m)       CBC:  Recent Labs  Lab 06/12/19 2305  06/15/19 0158 06/16/19 0521  WBC 21.9*   < > 17.1* 13.9*  NEUTROABS 19.1*  --   --   --   HGB 17.1*   < > 14.1 13.2  HCT 48.2   < > 40.8 38.9*  MCV 85.9   < > 88.5 89.6  PLT 335   < > 257 272   < > = values in this interval not displayed.    Basic Metabolic Panel:  Recent Labs  Lab 06/14/19 0218 06/15/19 0158 06/16/19 0521 06/17/19 0532 06/17/19 0623  NA 140 143 136 138  --   K 3.6 3.7 3.9 4.6  --   CL 111 116* 109 107  --   CO2 22 19* 18* 21*  --   GLUCOSE 105* 66* 298* 400* 399*  BUN 12 17 20  23*  --   CREATININE 2.24* 2.26* 2.59* 2.46*  --   CALCIUM 8.0* 8.6* 8.5* 8.5*  --   MG 2.1 2.0 1.7  --   --   PHOS 3.7 4.4  --   --   --    Lipid Panel:     Component Value Date/Time   CHOL 215 (H) 06/13/2019 0251   TRIG 298 (H) 06/13/2019 0251   HDL 53 06/13/2019 0251   CHOLHDL 4.1 06/13/2019 0251   VLDL 60 (H) 06/13/2019 0251   LDLCALC 102 (H) 06/13/2019 0251   HgbA1c:  Lab Results  Component Value Date   HGBA1C 8.4 (H) 06/13/2019   Urine Drug Screen:     Component Value Date/Time   LABOPIA NONE DETECTED 06/13/2019 0423   COCAINSCRNUR NONE DETECTED 06/13/2019 0423   LABBENZ NONE DETECTED 06/13/2019 0423   AMPHETMU NONE DETECTED 06/13/2019 0423   THCU POSITIVE (A) 06/13/2019 0423   LABBARB NONE DETECTED 06/13/2019 0423    Alcohol Level      Component Value Date/Time   ETH <10 06/12/2019 2305    IMAGING No results found.  PHYSICAL EXAM Temp:  [98.3 F (36.8 C)-98.7 F (37.1 C)] 98.7 F (37.1 C) (06/19 0533) Pulse Rate:  [93-116] 95 (06/19 0533) Resp:  [14-28] 20 (06/18 1421) BP: (144-176)/(91-107) 176/105 (06/19 0533) SpO2:  [99 %-100 %] 100 % (06/19 0533) Weight:  [50.3 kg-51 kg] 50.3 kg (06/19 0533)  General - Well nourished, well developed, not in acute distress  Ophthalmologic - fundi not visualized due to noncooperation.  Cardiovascular - Regular rhythm, but mild tachycardia.  Mental Status -  Level of arousal and orientation to time, place, and person were intact. Language including expression, naming, repetition, comprehension was assessed and found intact. Fund of Knowledge was assessed and was intact.  Cranial Nerves II - XII - II - Visual field intact  OU, no hemianopia but right visual field decreased acuity without gross hemianopia. III, IV, VI - Extraocular movements intact. V - Facial sensation intact bilaterally. VII - Facial movement intact bilaterally. VIII - Hearing & vestibular intact bilaterally. X - Palate elevates symmetrically. XI - Chin turning & shoulder shrug intact bilaterally. XII - Tongue protrusion intact.  Motor Strength - The patient's strength was normal in all extremities and pronator drift was absent.  Bulk was normal and fasciculations were absent.   Motor Tone - Muscle tone was assessed at the neck and appendages and was normal.  Reflexes - The patient's reflexes were symmetrical in all extremities and he had no pathological reflexes.  Sensory - Light touch, temperature/pinprick were assessed and were symmetrical.    Coordination - The patient had normal movements in the hands and feet with no ataxia or dysmetria.  Tremor was absent.  Gait and Station - deferred.   ASSESSMENT/PLAN [name] is a 33 y.o. male with history of type I DB presenting with  receptive aphasia and word salad responses, not oriented with HA.   Stroke: Large L PCA infarct, embolic pattern, secondary to large vessel disease vs cardioembolic source  CT head 6/96 2152 large L PCA infarct  MRI  Large L PCA infarct w/ petechial hmg L occipital lobe HI1  MRA  Severe stenosis L PCA  Carotid Doppler unremarkable  2D Echo EF 50-55%  LE venous dopplers no DVT  TEE - given severe leukocytosis and stroke in young age - still pending  May consider 30-day CardioNet monitoring if work-up unrevealing  LDL 102  HgbA1c 8.4  hypercoagulable/vasculitis neg  Lovenox 40 mg sq daily for VTE prophylaxis  No antithrombotic prior to admission, now on aspirin 325 mg and plavix daily.  Continue DAPT for 3 months and then aspirin alone given intracranial stenosis  Therapy recommendations:  No PT / OP OT  Disposition:  pending   Extreme Agitation, resolved  Required mult meds for sedation including precedex  Restrained, now off  CCM managing  Off precedex  Hypertension/Tachycardia, improved  No hx HTN. Not on home meds  SBP goal < 160  Long term BP goal normotensive  BP and tachycardia much improved  Off esmolol and cleviprex  On amlodipine 10 mg and metoprolol 50 bid  Labetalol PRN  Malignant HTN work up pending  CKD, stage III  No previous Cre to compare  Cr 2.23->2.54->2.24->2.26->2.59->2.46  Urine protein large  Encourage po intake  Hyperlipidemia  Home meds:  No statin  LDL 102, goal < 70  On lipitor 40  Continue statin at discharge  Diabetes type I Uncontrolled Hypoglycemic and hyperglycemic episodes  HgbA1c 8.4, goal < 7.0  CBGs  SSI  Still hyperglycemia, with hypoglycemia and hyperglycemia episodes  On Levemir -> insulin drip   On diet  DM coordinator on board  Leukocytosis, improving  WBC 21.9->35.6->14.2->17.1->13.9  Likely reactive  Lactic acid 2.5-> 6.5->1.7  CXR no active disease  UA  negative  Blood culture negative  Afebrile  Other Stroke Risk Factors  ETOH use, advised to drink no more than 2 drink(s) a day  THC use - UDS positive for THC this admission   Other [facility] day # 4  Neurology will follow peripherally. Please call if TEE with positive findings. Pt will follow up with stroke clinic NP at [facility] in about 4 weeks. Thanks for the consult.   Rosalin Hawking, MD PhD Stroke Neurology 06/17/2019 12:07 PM  To contact Stroke Continuity provider, please refer to [email or web address] After hours, contact General Neurology

## 2019-06-17 NOTE — Progress Notes (Signed)
  Speech Language Pathology Treatment: Cognitive-Linquistic  Patient Details Name: George [name] MRN: [id number] DOB: 1986/05/22 Today's Date: 06/17/2019 Time: 7711-6579 SLP Time Calculation (min) (ACUTE ONLY): 33 min  Assessment / Plan / Recommendation Clinical Impression  Pt was seen for cognitive-linguistic treatment session. He was alert and cooperative throughout the session without complalint of pain. He stated that he believes his cogntion is further improved today but still not back to normal. Improvement was again demonstrated during this session. He provided 5-8 items per abstract category and 13 items per concrete category during divergent naming tasks. He completed working memory tasks with 100% accuracy and achieved 80% accuracy with 5-digit immediate recall. He achieved 71% accuracy with recall of information related to complex paragraphs increasing to 100% accuracy with min. cues. With recall of inferential information from voice mails he demonstrated 100% accuracy. SLP will continue to follow pt.    HPI HPI: Pt is a 33 year old male with history of type 1 diabetes who presented to the emergency department with complaints of speech difficulty, "holding on to the wall" at home and headache. MRI of the brain revealed large left PCA distribution late acute/early subacute infarction. Petechial hemorrhage in left occipital lobe.      SLP Plan  Continue with current plan of care       Recommendations                   Follow up Recommendations: Outpatient SLP SLP Visit Diagnosis: Cognitive communication deficit (U38.333) Plan: Continue with current plan of care       Joniyah Mallinger I. [place]in Negus, [place], [place] Office number [phone] Pager [place] 06/17/2019, 3:24 PM

## 2019-06-17 NOTE — Progress Notes (Signed)
Inpatient Diabetes Program   AACE/ADA: New Consensus Statement on Inpatient Glycemic Control (2015)  Target Ranges:  Prepandial:   less than 140 mg/dL      Peak postprandial:   less than 180 mg/dL (1-2 hours)      Critically ill patients:  140 - 180 mg/dL   Lab Results  Component Value Date   GLUCAP 385 (H) 06/17/2019   HGBA1C 8.4 (H) 06/13/2019    A1c 8.4%. reinforced education from 6/17. Discussed with patient that NPH insulin is given every 12 hours for 24 hour a day coverage. Discussed the correction scale and carbohydrate coverage he uses at home. Discussed lowering his A1c to 7% or less and to seek help from physician to do so. Patient is really wanting to go home today and come back for his test on Monday.   Thanks,  Tama Headings RN, MSN, BC-ADM Inpatient Diabetes Coordinator Team Pager [phone] (8a-5p)

## 2019-06-17 NOTE — Plan of Care (Signed)
  Problem: Education: Goal: Knowledge of patient specific risk factors addressed and post discharge goals established will improve Outcome: Progressing   

## 2019-06-17 NOTE — Progress Notes (Signed)
Progress Note  Patient Name: George [name] Date of Encounter: 06/17/2019  Primary Cardiologist: No primary care provider on file.   Subjective   Now off IV CCB and on Amlodipine 103m daily along with Lopressor 214mBID.  BP remains elevated but HR much improved now 100-115bpm.  BS markedly elevated this am >450.  Inpatient Medications    Scheduled Meds: . amLODipine  10 mg Oral Daily  . aspirin EC  325 mg Oral Daily  . atorvastatin  40 mg Oral q1800  . Chlorhexidine Gluconate Cloth  6 each Topical Daily  . clopidogrel  75 mg Oral Daily  . enoxaparin (LOVENOX) injection  40 mg Subcutaneous Q24H  . insulin aspart  0-5 Units Subcutaneous QHS  . insulin aspart  0-9 Units Subcutaneous TID WC  . insulin aspart  3 Units Subcutaneous TID WC  . insulin detemir  10 Units Subcutaneous Daily  . metoprolol tartrate  50 mg Oral BID   Continuous Infusions: . sodium chloride Stopped (06/13/19 1639)   PRN Meds: sodium chloride, acetaminophen **OR** [DISCONTINUED] acetaminophen (TYLENOL) oral liquid 160 mg/5 mL **OR** acetaminophen, fentaNYL (SUBLIMAZE) injection, labetalol, LORazepam, metoprolol tartrate, senna-docusate   Vital Signs    Vitals:   06/16/19 1421 06/16/19 2105 06/17/19 0009 06/17/19 0533  BP: (!) 148/95 (!) 172/100 (!) 173/100 (!) 176/105  Pulse: (!) 116 97 93 95  Resp: 20     Temp: 98.6 F (37 C) 98.3 F (36.8 C) 98.4 F (36.9 C) 98.7 F (37.1 C)  TempSrc: Oral Oral  Oral  SpO2: 100% 100% 100% 100%  Weight: 51 kg   50.3 kg  Height: _0  (1.6 m)       Intake/Output Summary (Last 24 hours) at 06/17/2019 0805 Last data filed at 06/16/2019 1900 Gross per 24 hour  Intake 480 ml  Output 850 ml  Net -370 ml   Filed Weights   06/16/19 0500 06/16/19 1421 06/17/19 0533  Weight: 50.1 kg 51 kg 50.3 kg    Telemetry    Sinus tachycardia - Personally Reviewed  ECG    No new EKG to review - Personally Reviewed  Physical Exam   GEN: No acute distress.    Neck: No JVD Cardiac: RRR, no murmurs, rubs, or gallops.  Respiratory: Clear to auscultation bilaterally. GI: Soft, nontender, non-distended  MS: No edema; No deformity. Neuro:  Nonfocal  Psych: Normal affect   Labs    Chemistry Recent Labs  Lab 06/12/19 2305  06/15/19 0158 06/16/19 0521 06/17/19 0532  NA 134*   < > 143 136 138  K 3.2*   < > 3.7 3.9 4.6  CL 98   < > 116* 109 107  CO2 23   < > 19* 18* 21*  GLUCOSE 132*   < > 66* 298* 400*  BUN 14   < > 17 20 23*  CREATININE 2.23*   < > 2.26* 2.59* 2.46*  CALCIUM 9.6   < > 8.6* 8.5* 8.5*  PROT 7.2  --   --   --   --   ALBUMIN 3.7  --   --   --   --   AST 22  --   --   --   --   ALT 16  --   --   --   --   ALKPHOS 128*  --   --   --   --   BILITOT 0.4  --   --   --   --  GFRNONAA 38*   < > 37* 31* 33*  GFRAA 44*   < > 43* 36* 39*  ANIONGAP 13   < > _0 < > = values in this interval not displayed.     Hematology Recent Labs  Lab 06/14/19 0218 06/15/19 0158 06/16/19 0521  WBC 14.2* 17.1* 13.9*  RBC 3.81* 4.61 4.34  HGB 11.5* 14.1 13.2  HCT 33.7* 40.8 38.9*  MCV 88.5 88.5 89.6  MCH 30.2 30.6 30.4  MCHC 34.1 34.6 33.9  RDW 13.3 13.4 13.4  PLT 196 257 272    Cardiac Enzymes Recent Labs  Lab 06/14/19 1446 06/14/19 1925 06/15/19 0158  TROPONINI 0.24* 0.28* 0.34*   No results for input(s): TROPIPOC in the last 168 hours.   BNPNo results for input(s): BNP, PROBNP in the last 168 hours.   DDimer No results for input(s): DDIMER in the last 168 hours.   Radiology    No results found.  Cardiac Studies   2D echo 06/15/2019 IMPRESSIONS   1. The left ventricle has low normal systolic function, with an ejection fraction of 50-55%. The cavity size was normal. Indeterminate diastolic filling due to E-A fusion.  2. The right ventricle has normal systolic function. The cavity was normal. There is no increase in right ventricular wall thickness.  3. Atrial septal color Doppler challenging to interpreter.  Cannot exclude PFO though no definite L to R shunt detected. Consider transthoracic agitated saline exam.  4. No intracardiac thrombi or masses were visualized.  Patient Profile     33 y.o. male history of Dm1 admitted with slurred speech and balance issues at home along with headache. Found by neurology to have receptive aphasia and diagnosed with left occipital temoral lobse CVA, outside of tpa window  Later during admission became severely agitated, given several rounds of ativa, haldol. Significant HTN and tachycardia has been an ongoing issue even with resolution of agitation. Cardiology consulted to help manage.  Assessment & Plan    1.  Severe tachycardia -initial EKG show sinus tach 120s-150s, as well as a narrow complex regular tach 170s - tele reviewed, severe narrow complex tach 170s that looks to be sinus tach. Following the rhythms you can see where sinus tach speeds up and slow downs, there is no abrupt onset as would be seen with an SVT.  - unclear etiology, perhaps sympathetic hyperactivity/autonomic instability after his CVA. -His UDS was negative and he denies any EtOH use and has no other signs of EtOH withdrawal.  -no signicant anemia, normal TSH, not hypoxic, comfortable not in pain, not currently agitated. Nothing to support tachy from PE or pericardial effusion. Essentially looks for some reason to have autonomic/sympathetic surge. -initially on Esmolol gtt but transitioned to lopressor -24 hour urine for catecholamines pending to rule out pheo given marked tachycardia and hypertension with unknown etiology  -HR improved to the 90's after increasing lopressor to 43m BID but 115bpm this am but has not received his am lopressor.   -consider increasing lopressor to 787mBID given elevated BP but will defer to Neuro who recommended permissive HTN and he has not gotten am meds yet.   2.  Hypertensive urgency -BP elevated early this am at 176/10555m but has not gotten am  meds -Clevidipine gtt (dihydropiridine CCB) stopped and now on amlodipine 75m78mily and lopressor 50mg23m -consider increasing BB further  3.  Acute CVA -for TEE today with anesthesia -ESR and CRP elevated -vasculitis workup in progress  -  possible PFO on TTE - will need bubble study today with TEE -continue DAPT with ASA and plavix -continue statin  4.  Acute Kidney injury -secondary to severe hypertension -Creatinine on admit was 2.2>2.54>2.26>2.59>2.46  5.  Elevated troponin -mildly elevated with flat trend -c/w demand ischemia in the setting of hypertensive urgency/CVA and tachycardia -2D echo with normal LVF    For questions or updates, please contact [place] Please consult [email or web address] for contact info under Cardiology/STEMI.      Signed, Fransico Him, MD  06/17/2019, 8:05 AM

## 2019-06-17 NOTE — Progress Notes (Signed)
[facility] Progress Note Patient Name: George [name] DOB: 10/11/1986 MRN: [id number]   Date of Service  06/17/2019  HPI/Events of Note  Blood glucose = 238.   eICU Interventions  Will order: 1. Q 4 hour sensitive Novolog SSI.      Intervention Category Major Interventions: Hyperglycemia - active titration of insulin therapy  Lysle Dingwall 06/17/2019, 9:45 PM

## 2019-06-17 NOTE — Progress Notes (Signed)
Pt CBG 470. Notified MD and ordered STAT lab verification. Will continue to monitor.

## 2019-06-17 NOTE — Progress Notes (Signed)
Physical Therapy Treatment/ Discharge Patient Details Name: George [name] MRN: [id number] DOB: [birth year] Today's Date: 06/17/2019    History of Present Illness Patient is a 32 y/o male who presents with aphasia, difficulty "completing normal tasks," agitation and HA. Head CT- left PCA infarct. PMH includes type 1 diabetes.    PT Comments    George [name] reports feeling back to himself after elevated blood glucose this am. He was able to complete all mobility without LOB or difficulty with challenges and no balance deficits with only cognitive deficits related to memory at his time. No further P.T. needs at this time with pt aware and agreeable and will defer to SLP and OT for cognitive challenges and education. Pt has met goals and is in agreement to no further needs.    Follow Up Recommendations  No PT follow up     Equipment Recommendations  None recommended by PT    Recommendations for Other Services       Precautions / Restrictions Precautions Precautions: None    Mobility  Bed Mobility               General bed mobility comments: Out of bed in chair  Transfers Overall transfer level: Independent                  Ambulation/Gait Ambulation/Gait assistance: Independent Gait Distance (Feet): 800 Feet Assistive device: None Gait Pattern/deviations: WFL(Within Functional Limits)   Gait velocity interpretation: >4.37 ft/sec, indicative of normal walking speed General Gait Details: pt able to complete head turns, change of direction and speed with dual tasks   Stairs Stairs: Yes Stairs assistance: Modified independent (Device/Increase time) Stair Management: No rails;Forwards;Alternating pattern Number of Stairs: 2     Wheelchair Mobility    Modified Rankin (Stroke Patients Only) Modified Rankin (Stroke Patients Only) Pre-Morbid Rankin Score: No symptoms Modified Rankin: Slight disability     Balance                                  Standardized Balance Assessment Standardized Balance Assessment : Berg Balance Test Berg Balance Test Sit to Stand: Able to stand without using hands and stabilize independently Standing Unsupported: Able to stand safely 2 minutes Sitting with Back Unsupported but Feet Supported on Floor or Stool: Able to sit safely and securely 2 minutes Stand to Sit: Sits safely with minimal use of hands Transfers: Able to transfer safely, minor use of hands Standing Unsupported with Eyes Closed: Able to stand 10 seconds safely Standing Ubsupported with Feet Together: Able to place feet together independently and stand 1 minute safely From Standing, Reach Forward with Outstretched Arm: Can reach confidently >25 cm (10") From Standing Position, Pick up Object from Floor: Able to pick up shoe safely and easily From Standing Position, Turn to Look Behind Over each Shoulder: Looks behind from both sides and weight shifts well Turn 360 Degrees: Able to turn 360 degrees safely in 4 seconds or less Standing Unsupported, Alternately Place Feet on Step/Stool: Able to stand independently and safely and complete 8 steps in 20 seconds Standing Unsupported, One Foot in Front: Able to place foot tandem independently and hold 30 seconds Standing on One Leg: Able to lift leg independently and hold > 10 seconds Total Score: 56        Cognition Arousal/Alertness: Awake/alert Behavior During Therapy: WFL for tasks assessed/performed Overall Cognitive Status: Impaired/Different from baseline Area of Impairment: Memory  Current Attention Level: Alternating Memory: Decreased short-term memory Following Commands: Follows multi-step commands consistently       General Comments: pt unable to recall 3 animals that start with c and could only recall 2/3 words given after 2 min. otherwise oriented, following all commands and able to wayfind to room      Exercises      General Comments         Pertinent Vitals/Pain Pain Assessment: No/denies pain Faces Pain Scale: No hurt    Home Living                      Prior Function            PT Goals (current goals can now be found in the care plan section) Progress towards PT goals: Goals met/education completed, patient discharged from PT    Frequency           PT Plan Discharge plan needs to be updated    Co-evaluation              AM-PAC PT "6 Clicks" Mobility   Outcome Measure  Help needed turning from your back to your side while in a flat bed without using bedrails?: None Help needed moving from lying on your back to sitting on the side of a flat bed without using bedrails?: None Help needed moving to and from a bed to a chair (including a wheelchair)?: None Help needed standing up from a chair using your arms (e.g., wheelchair or bedside chair)?: None Help needed to walk in hospital room?: None Help needed climbing 3-5 steps with a railing? : None 6 Click Score: 24    End of Session   Activity Tolerance: Patient tolerated treatment well Patient left: in chair;with call bell/phone within reach Nurse Communication: Mobility status PT Visit Diagnosis: Difficulty in walking, not elsewhere classified (R26.2)     Time: 4401-0272 PT Time Calculation (min) (ACUTE ONLY): 17 min  Charges:  $Gait Training: 8-22 mins                     [place] Pager: [phone] Office: [place] 06/17/2019, 12:06 PM

## 2019-06-17 NOTE — Progress Notes (Signed)
  Speech Language Pathology Treatment: Cognitive-Linquistic  Patient Details Name: George [name] MRN: [id number] DOB: Dec 17, 1986 Today's Date: 06/16/2019 Time: 3128-1188 SLP Time Calculation (min) (ACUTE ONLY): 35 min  Assessment / Plan / Recommendation Clinical Impression  Pt was seen for cognitive-linguistic treatment session and continues to demonstrate improvement in this area. He demonstrated 80% accuracy with 4-item immediate recall increasing to 100% accuracy with min. cues. With recall of 5 items he demonstrated 20% accuracy increasing to 100% with min-mod cues. He completed a 3-word opposite mental manipulation task with 80% accuracy increasing to 100% with min. cues. He provided 6-10 items during divergent naming tasks. He achieved 100% accuracy with money (dollar and coin) calculations and with a word scramble task. He demonstrated 70% accuracy with recall of inferential information from voice mails increasing to 80% accuracy with min. cues. SLP will continue to follow pt.    HPI HPI: 33 year old with history of diabetes presenting with left occipital, temporal lobe stroke.      SLP Plan  Continue with current plan of care       Recommendations                   Follow up Recommendations: Outpatient SLP SLP Visit Diagnosis: Cognitive communication deficit (Q77.373) Plan: Continue with current plan of care       Raahim Shartzer I. Hardin Negus, [place], [place] Office number [phone] Pager [place] 06/17/2019, 11:01 AM

## 2019-06-18 DIAGNOSIS — E1010 Type 1 diabetes mellitus with ketoacidosis without coma: Secondary | ICD-10-CM

## 2019-06-18 DIAGNOSIS — D72829 Elevated white blood cell count, unspecified: Secondary | ICD-10-CM

## 2019-06-18 LAB — BASIC METABOLIC PANEL
Anion gap: 11 (ref 5–15)
Anion gap: 8 (ref 5–15)
BUN: 32 mg/dL — ABNORMAL HIGH (ref 6–20)
BUN: 27 mg/dL — ABNORMAL HIGH (ref 6–20)
CO2: 19 mmol/L — ABNORMAL LOW (ref 22–32)
CO2: 22 mmol/L (ref 22–32)
Calcium: 8.2 mg/dL — ABNORMAL LOW (ref 8.9–10.3)
Calcium: 8.3 mg/dL — ABNORMAL LOW (ref 8.9–10.3)
Chloride: 101 mmol/L (ref 98–111)
Chloride: 109 mmol/L (ref 98–111)
Creatinine, Ser: 2.36 mg/dL — ABNORMAL HIGH (ref 0.61–1.24)
Creatinine, Ser: 2.27 mg/dL — ABNORMAL HIGH (ref 0.61–1.24)
GFR calc Af Amer: 41 mL/min — ABNORMAL LOW (ref >60)
GFR calc Af Amer: 43 mL/min — ABNORMAL LOW (ref >60)
GFR calc non Af Amer: 35 mL/min — ABNORMAL LOW (ref >60)
GFR calc non Af Amer: 37 mL/min — ABNORMAL LOW (ref >60)
Glucose, Bld: 475 mg/dL — ABNORMAL HIGH (ref 70–99)
Glucose, Bld: 126 mg/dL — ABNORMAL HIGH (ref 70–99)
Potassium: 4.3 mmol/L (ref 3.5–5.1)
Potassium: 3.6 mmol/L (ref 3.5–5.1)
Sodium: 131 mmol/L — ABNORMAL LOW (ref 135–145)
Sodium: 139 mmol/L (ref 135–145)

## 2019-06-18 LAB — GLUCOSE, CAPILLARY
Glucose-Capillary: 103 mg/dL — ABNORMAL HIGH (ref 70–99)
Glucose-Capillary: 118 mg/dL — ABNORMAL HIGH (ref 70–99)
Glucose-Capillary: 122 mg/dL — ABNORMAL HIGH (ref 70–99)
Glucose-Capillary: 124 mg/dL — ABNORMAL HIGH (ref 70–99)
Glucose-Capillary: 126 mg/dL — ABNORMAL HIGH (ref 70–99)
Glucose-Capillary: 133 mg/dL — ABNORMAL HIGH (ref 70–99)
Glucose-Capillary: 167 mg/dL — ABNORMAL HIGH (ref 70–99)
Glucose-Capillary: 168 mg/dL — ABNORMAL HIGH (ref 70–99)
Glucose-Capillary: 180 mg/dL — ABNORMAL HIGH (ref 70–99)
Glucose-Capillary: 181 mg/dL — ABNORMAL HIGH (ref 70–99)
Glucose-Capillary: 205 mg/dL — ABNORMAL HIGH (ref 70–99)
Glucose-Capillary: 230 mg/dL — ABNORMAL HIGH (ref 70–99)
Glucose-Capillary: 231 mg/dL — ABNORMAL HIGH (ref 70–99)
Glucose-Capillary: 277 mg/dL — ABNORMAL HIGH (ref 70–99)
Glucose-Capillary: 344 mg/dL — ABNORMAL HIGH (ref 70–99)
Glucose-Capillary: 453 mg/dL — ABNORMAL HIGH (ref 70–99)
Glucose-Capillary: 92 mg/dL (ref 70–99)
Glucose-Capillary: 96 mg/dL (ref 70–99)

## 2019-06-18 LAB — CBC
HCT: 35.9 % — ABNORMAL LOW (ref 39.0–52.0)
Hemoglobin: 12.5 g/dL — ABNORMAL LOW (ref 13.0–17.0)
MCH: 30.6 pg (ref 26.0–34.0)
MCHC: 34.8 g/dL (ref 30.0–36.0)
MCV: 88 fL (ref 80.0–100.0)
Platelets: 239 10*3/uL (ref 150–400)
RBC: 4.08 MIL/uL — ABNORMAL LOW (ref 4.22–5.81)
RDW: 12.7 % (ref 11.5–15.5)
WBC: 14.1 10*3/uL — ABNORMAL HIGH (ref 4.0–10.5)
nRBC: 0 % (ref 0.0–0.2)

## 2019-06-18 LAB — CULTURE, BLOOD (ROUTINE X 2)
Culture: NO GROWTH
Culture: NO GROWTH
Special Requests: ADEQUATE
Special Requests: ADEQUATE

## 2019-06-18 MED ORDER — CARVEDILOL 25 MG PO TABS
25.0000 mg | ORAL_TABLET | Freq: Two times a day (BID) | ORAL | Status: DC
  Administered 2019-06-18 – 2019-06-20 (×5): 25 mg via ORAL
  Filled 2019-06-18 (×6): qty 1

## 2019-06-18 MED ORDER — INSULIN NPH (HUMAN) (ISOPHANE) 100 UNIT/ML ~~LOC~~ SUSP
15.0000 [IU] | Freq: Two times a day (BID) | SUBCUTANEOUS | Status: DC
  Administered 2019-06-18 – 2019-06-20 (×4): 15 [IU] via SUBCUTANEOUS
  Filled 2019-06-18: qty 10

## 2019-06-18 MED ORDER — INSULIN ASPART 100 UNIT/ML ~~LOC~~ SOLN
0.0000 [IU] | Freq: Three times a day (TID) | SUBCUTANEOUS | Status: DC
  Administered 2019-06-20: 2 [IU] via SUBCUTANEOUS

## 2019-06-18 MED ORDER — INSULIN ASPART 100 UNIT/ML ~~LOC~~ SOLN: 0.0000 [IU] | Freq: Every day | SUBCUTANEOUS | Status: DC

## 2019-06-18 MED ORDER — INSULIN REGULAR(HUMAN) IN NACL 100-0.9 UT/100ML-% IV SOLN
INTRAVENOUS | Status: DC
  Administered 2019-06-18: 2.8 [IU]/h via INTRAVENOUS

## 2019-06-18 NOTE — Progress Notes (Signed)
Orders received to start NPH insulin 15 units bid with meals. Per Dr. Rodena Piety, will cont insulin gtt until 1 hour after NPH given.

## 2019-06-18 NOTE — Progress Notes (Signed)
CRITICAL VALUE ALERT  Critical Value:  CBG 453  Date & Time Notied:  06/18/19 @ 1220 Provider Notified: Dr. Oletta Darter Orders Received/Actions taken: pending

## 2019-06-18 NOTE — Progress Notes (Signed)
Progress Note  Patient Name: George [name] Date of Encounter: 06/18/2019  Primary Cardiologist: No primary care provider on file.   Subjective    BP remains elevated - poorly controlled. Past permissive hypertension at this point. BG poorly controlled- TEE was cancelled yesterday due to this. Hopeful to perform on Monday.  Inpatient Medications    Scheduled Meds: . amLODipine  10 mg Oral Daily  . aspirin EC  325 mg Oral Daily  . atorvastatin  40 mg Oral q1800  . Chlorhexidine Gluconate Cloth  6 each Topical Daily  . clopidogrel  75 mg Oral Daily  . enoxaparin (LOVENOX) injection  40 mg Subcutaneous Q24H  . metoprolol tartrate  50 mg Oral BID   Continuous Infusions: . sodium chloride Stopped (06/13/19 1639)  . sodium chloride 50 mL/hr at 06/17/19 0941  . dextrose 5 % and 0.45% NaCl 10 mL/hr at 06/17/19 1731  . insulin Stopped (06/18/19 0630)   PRN Meds: sodium chloride, acetaminophen **OR** [DISCONTINUED] acetaminophen (TYLENOL) oral liquid 160 mg/5 mL **OR** acetaminophen, fentaNYL (SUBLIMAZE) injection, labetalol, LORazepam, metoprolol tartrate, senna-docusate   Vital Signs    Vitals:   06/17/19 1441 06/17/19 2205 06/17/19 2224 06/18/19 0544  BP: (!) 163/102 (!) 168/96  (!) 148/110  Pulse: 90 100  92  Resp:    20  Temp: 98.5 F (36.9 C)  98.5 F (36.9 C) 98.5 F (36.9 C)  TempSrc: Oral  Oral Oral  SpO2: 95%  100% 100%  Weight:    50.3 kg  Height:        Intake/Output Summary (Last 24 hours) at 06/18/2019 0901 Last data filed at 06/17/2019 1846 Gross per 24 hour  Intake 425.48 ml  Output 700 ml  Net -274.52 ml   Filed Weights   06/16/19 1421 06/17/19 0533 06/18/19 0544  Weight: 51 kg 50.3 kg 50.3 kg    Telemetry    Sinus tachycardia - Personally Reviewed  ECG    No new EKG to review - Personally Reviewed  Physical Exam   General appearance: alert, no distress and thin Neck: no carotid bruit, no JVD and thyroid not enlarged, symmetric, no  tenderness/mass/nodules Lungs: clear to auscultation bilaterally Heart: regular tachycardia Abdomen: soft, non-tender; bowel sounds normal; no masses,  no organomegaly Extremities: extremities normal, atraumatic, no cyanosis or edema Pulses: 2+ and symmetric Skin: Skin color, texture, turgor normal. No rashes or lesions Neurologic: Grossly normal Psych: Impatient   Labs    Chemistry Recent Labs  Lab 06/12/19 2305  06/16/19 0521 06/17/19 0532 06/17/19 0623 06/18/19 0103  NA 134*   < > 136 138  --  131*  K 3.2*   < > 3.9 4.6  --  4.3  CL 98   < > 109 107  --  101  CO2 23   < > 18* 21*  --  19*  GLUCOSE 132*   < > 298* 400* 399* 475*  BUN 14   < > 20 23*  --  32*  CREATININE 2.23*   < > 2.59* 2.46*  --  2.36*  CALCIUM 9.6   < > 8.5* 8.5*  --  8.2*  PROT 7.2  --   --   --   --   --   ALBUMIN 3.7  --   --   --   --   --   AST 22  --   --   --   --   --   ALT 16  --   --   --   --   --  ALKPHOS 128*  --   --   --   --   --   BILITOT 0.4  --   --   --   --   --   GFRNONAA 38*   < > 31* 33*  --  35*  GFRAA 44*   < > 36* 39*  --  41*  ANIONGAP 13   < > 9 10  --  11   < > = values in this interval not displayed.     Hematology Recent Labs  Lab 06/15/19 0158 06/16/19 0521 06/18/19 0103  WBC 17.1* 13.9* 14.1*  RBC 4.61 4.34 4.08*  HGB 14.1 13.2 12.5*  HCT 40.8 38.9* 35.9*  MCV 88.5 89.6 88.0  MCH 30.6 30.4 30.6  MCHC 34.6 33.9 34.8  RDW 13.4 13.4 12.7  PLT 257 272 239    Cardiac Enzymes Recent Labs  Lab 06/14/19 1446 06/14/19 1925 06/15/19 0158  TROPONINI 0.24* 0.28* 0.34*   No results for input(s): TROPIPOC in the last 168 hours.   BNPNo results for input(s): BNP, PROBNP in the last 168 hours.   DDimer No results for input(s): DDIMER in the last 168 hours.   Radiology    No results found.  Cardiac Studies   2D echo 06/15/2019 IMPRESSIONS   1. The left ventricle has low normal systolic function, with an ejection fraction of 50-55%. The cavity size  was normal. Indeterminate diastolic filling due to E-A fusion.  2. The right ventricle has normal systolic function. The cavity was normal. There is no increase in right ventricular wall thickness.  3. Atrial septal color Doppler challenging to interpreter. Cannot exclude PFO though no definite L to R shunt detected. Consider transthoracic agitated saline exam.  4. No intracardiac thrombi or masses were visualized.  Patient Profile     33 y.o. male history of Dm1 admitted with slurred speech and balance issues at home along with headache. Found by neurology to have receptive aphasia and diagnosed with left occipital temoral lobse CVA, outside of tpa window. Later during admission became severely agitated, given several rounds of ativa, haldol. Significant HTN and tachycardia has been an ongoing issue even with resolution of agitation. Cardiology consulted to help manage.  Assessment & Plan    1.  Severe tachycardia -initial EKG show sinus tach 120s-150s, as well as a narrow complex regular tach 170s - tele reviewed, severe narrow complex tach 170s that looks to be sinus tach. Following the rhythms you can see where sinus tach speeds up and slow downs, there is no abrupt onset as would be seen with an SVT.  - unclear etiology, perhaps sympathetic hyperactivity/autonomic instability after his CVA. -His UDS was negative and he denies any EtOH use and has no other signs of EtOH withdrawal.  -no signicant anemia, normal TSH, not hypoxic, comfortable not in pain, not currently agitated. Nothing to support tachy from PE or pericardial effusion. Essentially looks for some reason to have autonomic/sympathetic surge. -initially on Esmolol gtt but transitioned to lopressor -24 hour urine for catecholamines pending to rule out pheo given marked tachycardia and hypertension with unknown etiology  -HR improved to the 90's after increasing lopressor to 87m BID but 115bpm this am but has not received his am  lopressor.   - changed to carvedilol 25 mg BID today  2.  Hypertensive urgency -BP elevated early this am at 176/1026mg but has not gotten am meds -Clevidipine gtt (dihydropiridine CCB) stopped and now on amlodipine 1056maily and  lopressor 69m BID - Echo shows low normal LVEF 50-55% - will switch metoprolol to cardvedilol 25 mg BID today  3.  Acute CVA -TEE postponed until Monday -ESR and CRP elevated -vasculitis workup in progress  -possible PFO on TTE - will need bubble study today with TEE -continue DAPT with ASA and plavix -continue statin  4.  Acute Kidney injury -secondary to severe hypertension -Creatinine on admit was 2.2>2.54>2.26>2.59>2.46  5.  Elevated troponin -mildly elevated with flat trend -c/w demand ischemia in the setting of hypertensive urgency/CVA and tachycardia -2D echo with normal LVF  6. Insulin dependent diabetes - has been on insulin gtts - BG's remain poorly controlled, need more aggressive therapy - Consider addition of oral agents such as SGLT2 or GLP-1 - Consider starting long-acting insulin BID     For questions or updates, please contact [facility] Please consult [email or web address] for contact info under Cardiology/STEMI.   KPixie Casino MD, FSaline Memorial Hospital FIoscoDirector of the [facility] Lip[facility] &  [facility] Diplomate of the American Board of Clinical Lipidology Attending Cardiologist  Direct Dial: [phone] Fax: [phone] Website:  [email or web address]  KPixie Casino MD  06/18/2019, 9:01 AM

## 2019-06-18 NOTE — Progress Notes (Addendum)
PROGRESS NOTE    [facility]  HQP:[id number] DOB: April 04, 1986 DOA: 06/12/2019 PCP: Patient, No Pcp Per   Brief Narrative:33 year old male history of type 1 diabetes who presented to the emergency department with complaints of speech difficulty, "holding on to the wall" at home and headache. Last known normal was around 8 pm Friday night, then awoke Saturday morning with above symptoms.    He was seen by neurology who noted receptive aphasia, not oriented, difficulty comprehending commands.  She was out of the window for TPA and was admitted to critical care medicine for intensive blood pressure control and stroke monitoring.  When I arrived he was initially calm, able to cooperate fully with neurological exam as below, however he shortly thereafter became very agitated. He had already received multiple doses of ativan and he was subsequently given haldol 5 followed by 10 mg before becoming calm.   I spoke with his mother who stated he complained of minor abdominal pain, but otherwise no fevers, chills, sore throat, myalgias, nausea, vomiting, diarrhea, shortness of breath.  She states that he has a chronic "smoker's cough" that is unchanged from his baseline  TRH pickup 06/18/2019 Assessment & Plan:   Active Problems:   Neurologic deficit due to acute ischemic cerebrovascular accident (CVA) ([place])   Stroke (cerebrum) (HCC)   Cerebral infarction (HCC)   Leukocytosis   Diabetes mellitus type I ([place])   AKI (acute kidney injury) ([place])   CKD (chronic kidney disease), stage III (HCC)   Agitation   Supraventricular tachycardia ([place])   Hypertensive urgency  #1 DKA with type 1 diabetes had to restart by critical care insulin drip last night as his blood sugar was above 400.  Patient is NPH and regular insulin at home will restart NPH once blood sugar gets better controlled and his gap is closed continue insulin drip for now.  His last blood glucose was 475 with a creatinine of 2.36 and his  gap was 11.  #2 large left PCA infarct await TEE.  Transthoracic echo ejection fraction 50 to 55% could not exclude PFO but no definite left-to-right shunt patient awaiting transesophageal echo.  MRI MRA of the brain shows large left PCA infarct with severe stenosis of the left posterior temporal artery branch of left PCA.  CT head showed large PCA infarct on the left.  Continue aspirin and Plavix.  He was COVID negative.  #3 substance abuse urine drug screen positive for THC  #4 tobacco abuse counseled against the use of tobacco.  #5 sinus tachycardia with uncontrolled hypertension on carvedilol 25 mg twice a day and amlodipine 10 mg daily.  #6 hypertension uncontrolled continue above medications  #7 hyperlipidemia continue Lipitor  #8 AKI it is unknown what his baseline creatinine is however with uncontrolled diabetes he most likely had higher creatinine and possible CKD.  Estimated body mass index is 19.63 kg/m as calculated from the following:   Height as of this encounter: 5\' 3"  (1.6 m).   Weight as of this encounter: 50.3 kg.    Subjective:  Talking on the phone very anxious to go home feels better denies any nausea vomiting or diarrhea Objective: Vitals:   06/17/19 1441 06/17/19 2205 06/17/19 2224 06/18/19 0544  BP: (!) 163/102 (!) 168/96  (!) 148/110  Pulse: 90 100  92  Resp:    20  Temp: 98.5 F (36.9 C)  98.5 F (36.9 C) 98.5 F (36.9 C)  TempSrc: Oral  Oral Oral  SpO2: 95%  100% 100%  Weight:    50.3 kg  Height:        Intake/Output Summary (Last 24 hours) at 06/18/2019 1124 Last data filed at 06/17/2019 1846 Gross per 24 hour  Intake 425.48 ml  Output 700 ml  Net -274.52 ml   Filed Weights   06/16/19 1421 06/17/19 0533 06/18/19 0544  Weight: 51 kg 50.3 kg 50.3 kg    Examination:  General exam: Appears calm and comfortable  Respiratory system: Clear to auscultation. Respiratory effort normal. Cardiovascular system: S1 & S2 heard, RRR. No JVD,  murmurs, rubs, gallops or clicks. No pedal edema. Gastrointestinal system: Abdomen is nondistended, soft and nontender. No organomegaly or masses felt. Normal bowel sounds heard. Central nervous system: Alert and oriented. No focal neurological deficits. Extremities: Symmetric 5 x 5 power. Skin: No rashes, lesions or ulcers Psychiatry: Judgement and insight appear normal. Mood & affect appropriate.     Data Reviewed: I have personally reviewed following labs and imaging studies  CBC: Recent Labs  Lab 06/12/19 2305  06/13/19 0251 06/14/19 0218 06/15/19 0158 06/16/19 0521 06/18/19 0103  WBC 21.9*  --  35.6* 14.2* 17.1* 13.9* 14.1*  NEUTROABS 19.1*  --   --   --   --   --   --   HGB 17.1*   < > 14.2 11.5* 14.1 13.2 12.5*  HCT 48.2   < > 39.8 33.7* 40.8 38.9* 35.9*  MCV 85.9  --  86.9 88.5 88.5 89.6 88.0  PLT 335  --  274 196 257 272 239   < > = values in this interval not displayed.   Basic Metabolic Panel: Recent Labs  Lab 06/12/19 2305  06/14/19 0218 06/15/19 0158 06/16/19 0521 06/17/19 0532 06/17/19 0623 06/18/19 0103  NA 134*   < > 140 143 136 138  --  131*  K 3.2*   < > 3.6 3.7 3.9 4.6  --  4.3  CL 98   < > 111 116* 109 107  --  101  CO2 23   < > 22 19* 18* 21*  --  19*  GLUCOSE 132*   < > 105* 66* 298* 400* 399* 475*  BUN 14   < > 12 17 20  23*  --  32*  CREATININE 2.23*   < > 2.24* 2.26* 2.59* 2.46*  --  2.36*  CALCIUM 9.6   < > 8.0* 8.6* 8.5* 8.5*  --  8.2*  MG 1.9  --  2.1 2.0 1.7  --   --   --   PHOS 2.4*  --  3.7 4.4  --   --   --   --    < > = values in this interval not displayed.   GFR: Estimated Creatinine Clearance: 32 mL/min (A) (by C-G formula based on SCr of 2.36 mg/dL (H)). Liver Function Tests: Recent Labs  Lab 06/12/19 2305  AST 22  ALT 16  ALKPHOS 128*  BILITOT 0.4  PROT 7.2  ALBUMIN 3.7   No results for input(s): LIPASE, AMYLASE in the last 168 hours. No results for input(s): AMMONIA in the last 168 hours. Coagulation Profile:  Recent Labs  Lab 06/12/19 2305  INR 0.9   Cardiac Enzymes: Recent Labs  Lab 06/14/19 1446 06/14/19 1925 06/15/19 0158  TROPONINI 0.24* 0.28* 0.34*   BNP (last 3 results) No results for input(s): PROBNP in the last 8760 hours. HbA1C: No results for input(s): HGBA1C in the last 72 hours. CBG: Recent Labs  Lab 06/18/19 [phone]  06/18/19 0748 06/18/19 0856 06/18/19 0958 06/18/19 1058  GLUCAP 124* 133* 230* 231* 180*   Lipid Profile: No results for input(s): CHOL, HDL, LDLCALC, TRIG, CHOLHDL, LDLDIRECT in the last 72 hours. Thyroid Function Tests: No results for input(s): TSH, T4TOTAL, FREET4, T3FREE, THYROIDAB in the last 72 hours. Anemia Panel: No results for input(s): VITAMINB12, FOLATE, FERRITIN, TIBC, IRON, RETICCTPCT in the last 72 hours. Sepsis Labs: Recent Labs  Lab 06/12/19 2305 06/13/19 0251 06/14/19 1053  LATICACIDVEN 2.5* 6.5* 1.7    Recent Results (from the past 240 hour(s))  Novel Coronavirus,NAA,(SEND-OUT TO REF LAB - TAT 24-48 hrs); Hosp Order     Status: None   Collection Time: 06/13/19  1:25 AM   Specimen: Nasopharyngeal Swab; Respiratory  Result Value Ref Range Status   SARS-CoV-2, NAA NOT DETECTED NOT DETECTED Final    Comment: (NOTE) This test was developed and its performance characteristics determined by [employer]. This test has not been FDA cleared or approved. This test has been authorized by FDA under an Emergency Use Authorization (EUA). This test is only authorized for the duration of time the declaration that circumstances exist justifying the authorization of the emergency use of in vitro diagnostic tests for detection of SARS-CoV-2 virus and/or diagnosis of COVID-19 infection under section 564(b)(1) of the Act, 21 U.S.C. 161WRU-0(A)(5), unless the authorization is terminated or revoked sooner. When diagnostic testing is negative, the possibility of a false negative result should be considered in the context of a patient's  recent exposures and the presence of clinical signs and symptoms consistent with COVID-19. An individual without symptoms of COVID-19 and who is not shedding SARS-CoV-2 virus would expect to have a negative (not detected) result in this assay. Performed  At: [facility] [address] [place], [place] [id number] Rush Farmer MD NW:[id number]    [place]  Final    Comment: Performed at [facility], [place] [address], [place], [place] [address]  MRSA PCR Screening     Status: None   Collection Time: 06/13/19  3:46 AM   Specimen: Nasopharyngeal  Result Value Ref Range Status   MRSA by PCR NEGATIVE NEGATIVE Final    Comment:        The GeneXpert MRSA Assay (FDA approved for NASAL specimens only), is one component of a comprehensive MRSA colonization surveillance program. It is not intended to diagnose MRSA infection nor to guide or monitor treatment for MRSA infections. Performed at [facility], [place] [address] [place], [place] [address]   Culture, blood (Routine X 2) w Reflex to ID Panel     Status: None (Preliminary result)   Collection Time: 06/13/19 10:00 AM   Specimen: Left Antecubital; Blood  Result Value Ref Range Status   Specimen Description LEFT ANTECUBITAL  Final   Special Requests   Final    BOTTLES DRAWN AEROBIC ONLY Blood Culture adequate volume   Culture   Final    NO GROWTH 4 DAYS Performed at [facility], [place] [address], [place], [place] [address]    Report Status PENDING  Incomplete  Culture, blood (Routine X 2) w Reflex to ID Panel     Status: None (Preliminary result)   Collection Time: 06/13/19 10:05 AM   Specimen: Left Antecubital; Blood  Result Value Ref Range Status   Specimen Description LEFT ANTECUBITAL  Final   Special Requests   Final    BOTTLES DRAWN AEROBIC ONLY Blood Culture adequate volume   Culture  Final    NO GROWTH 4 DAYS Performed at [facility], [place] [address]  [place], [place] [address]    Report Status PENDING  Incomplete         Radiology Studies: No results found.      Scheduled Meds: . amLODipine  10 mg Oral Daily  . aspirin EC  325 mg Oral Daily  . atorvastatin  40 mg Oral q1800  . carvedilol  25 mg Oral BID WC  . Chlorhexidine Gluconate Cloth  6 each Topical Daily  . clopidogrel  75 mg Oral Daily  . enoxaparin (LOVENOX) injection  40 mg Subcutaneous Q24H   Continuous Infusions: . sodium chloride Stopped (06/13/19 1639)  . sodium chloride 50 mL/hr at 06/17/19 0941  . dextrose 5 % and 0.45% NaCl 10 mL/hr at 06/17/19 1731  . insulin Stopped (06/18/19 0630)     LOS: 5 days     Georgette Shell, MD [facility]  If 7PM-7AM, please contact night-coverage [email or web address] Password [email or web address] 06/18/2019, 11:24 AM

## 2019-06-18 NOTE — Progress Notes (Signed)
Occupational Therapy Treatment Patient Details Name: George [name] MRN: [id number] DOB: [birth year] Today's Date: 06/18/2019    History of present illness Patient is a 33 y/o male who presents with aphasia, difficulty "completing normal tasks," agitation and HA. Head CT- left PCA infarct. PMH includes type 1 diabetes.   OT comments  Patient progressing well.  Completing all transfers and mobility with supervision, engaged in cognitive re-training with focus on memory tasks with moderate distractions.  Requires minimal (3) verbal cues to complete 4 step task, including requiring pt to problem solve and locate items needed in room (placed within L visual field). Discussed memory strategies, provided handout, specifically related to medication mgmt.  Pt reports his mother plans on assisting as needed at dc.  Plan for pill box test next session.  DC plan updated to OP OT services (neuro OP).    Follow Up Recommendations  Outpatient OT;Supervision/Assistance - 24 hour(neuro OP)    Equipment Recommendations  None recommended by OT    Recommendations for Other Services      Precautions / Restrictions Precautions Precautions: None Precaution Comments: watch BP       Mobility Bed Mobility               General bed mobility comments: OOB upon entry  Transfers Overall transfer level: Needs assistance   Transfers: Sit to/from Stand Sit to Stand: Supervision         General transfer comment: for safety    Balance Overall balance assessment: No apparent balance deficits (not formally assessed)                                         ADL either performed or assessed with clinical judgement   ADL Overall ADL's : Needs assistance/impaired     Grooming: Supervision/safety;Wash/dry hands;Wash/dry face;Standing                   Toilet Transfer: Supervision/safety;Ambulation Toilet Transfer Details (indicate cue type and reason): simulated to  recliner          Functional mobility during ADLs: Supervision/safety       Vision   Additional Comments: wearing glasses, able to locate items throughout room (placed in L superior field) without cueing, given increased time    Perception     Praxis      Cognition Arousal/Alertness: Awake/alert Behavior During Therapy: WFL for tasks assessed/performed Overall Cognitive Status: Impaired/Different from baseline Area of Impairment: Memory;Problem solving                     Memory: Decreased short-term memory       Problem Solving: Requires verbal cues General Comments: pt progressing well.  pt able to complete 4 step trail making task with 3 verbal cues, particulary on the last task to be completed, distracted by having to locate items in room as well with min cueing.          Exercises     Shoulder Instructions       General Comments discussed memory strategies in preparation for dc home, issued handout and reviewed with pt    Pertinent Vitals/ Pain       Pain Assessment: No/denies pain  Home Living  Prior Functioning/Environment              Frequency  Min 3X/week        Progress Toward Goals  OT Goals(current goals can now be found in the care plan section)  Progress towards OT goals: Progressing toward goals  Acute Rehab OT Goals Patient Stated Goal: to get better OT Goal Formulation: With patient  Plan Frequency remains appropriate;Discharge plan needs to be updated    Co-evaluation                 AM-PAC OT "6 Clicks" Daily Activity     Outcome Measure   Help from another person eating meals?: None Help from another person taking care of personal grooming?: None Help from another person toileting, which includes using toliet, bedpan, or urinal?: None Help from another person bathing (including washing, rinsing, drying)?: None Help from another person to put on  and taking off regular upper body clothing?: None Help from another person to put on and taking off regular lower body clothing?: None 6 Click Score: 24    End of Session    OT Visit Diagnosis: Unsteadiness on feet (R26.81);Other abnormalities of gait and mobility (R26.89);Muscle weakness (generalized) (M62.81);Other symptoms and signs involving cognitive function   Activity Tolerance Patient tolerated treatment well   Patient Left in chair;with call bell/phone within reach;with nursing/sitter in room   Nurse Communication Mobility status        Time: 1457-1520 OT Time Calculation (min): 23 min  Charges: OT General Charges $OT Visit: 1 Visit OT Treatments $Self Care/Home Management : 8-22 mins $Cognitive Funtion additional: Additional15 mins  Delight Stare, OT Acute Rehabilitation Services Pager [phone] Office [phone]    Delight Stare 06/18/2019, 3:48 PM

## 2019-06-18 NOTE — Progress Notes (Signed)
[facility] Progress Note Patient Name: George [name] DOB: 08-05-1986 MRN: [id number]   Date of Service  06/18/2019  HPI/Events of Note  Hyperglycemia - Blood glucose = 453.   eICU Interventions  Will order: 1. Insulin IV infusion per glucose stabilizer protocol.      Intervention Category Major Interventions: Hyperglycemia - active titration of insulin therapy  Sommer,Steven Eugene 06/18/2019, 1:01 AM

## 2019-06-19 LAB — BASIC METABOLIC PANEL
Anion gap: 7 (ref 5–15)
BUN: 25 mg/dL — ABNORMAL HIGH (ref 6–20)
CO2: 22 mmol/L (ref 22–32)
Calcium: 8.2 mg/dL — ABNORMAL LOW (ref 8.9–10.3)
Chloride: 109 mmol/L (ref 98–111)
Creatinine, Ser: 2.24 mg/dL — ABNORMAL HIGH (ref 0.61–1.24)
GFR calc Af Amer: 43 mL/min — ABNORMAL LOW (ref >60)
GFR calc non Af Amer: 37 mL/min — ABNORMAL LOW (ref >60)
Glucose, Bld: 125 mg/dL — ABNORMAL HIGH (ref 70–99)
Potassium: 3.7 mmol/L (ref 3.5–5.1)
Sodium: 138 mmol/L (ref 135–145)

## 2019-06-19 LAB — GLUCOSE, CAPILLARY
Glucose-Capillary: 124 mg/dL — ABNORMAL HIGH (ref 70–99)
Glucose-Capillary: 133 mg/dL — ABNORMAL HIGH (ref 70–99)
Glucose-Capillary: 145 mg/dL — ABNORMAL HIGH (ref 70–99)
Glucose-Capillary: 182 mg/dL — ABNORMAL HIGH (ref 70–99)
Glucose-Capillary: 79 mg/dL (ref 70–99)
Glucose-Capillary: 82 mg/dL (ref 70–99)

## 2019-06-19 LAB — MAGNESIUM: Magnesium: 1.8 mg/dL (ref 1.7–2.4)

## 2019-06-19 MED ORDER — CLONIDINE HCL 0.1 MG/24HR TD PTWK
0.1000 mg | MEDICATED_PATCH | TRANSDERMAL | Status: DC
  Administered 2019-06-19: 0.1 mg via TRANSDERMAL
  Filled 2019-06-19: qty 1

## 2019-06-19 NOTE — Progress Notes (Signed)
PROGRESS NOTE    [facility]  ZCH:[id number] DOB: [birth year] DOA: 06/12/2019 PCP: Patient, No Pcp Per  Brief Narrative: :33 year old male history of type 1 diabetes who presented to the emergency department with complaintsof speech difficulty,"holding on to the wall"at home and headache. Last known normal was around 8 pm Friday night, then awoke Saturday morning with above symptoms.  He was seen by neurology who noted receptive aphasia, not oriented, difficulty comprehending commands. She was out of the window for TPA and was admitted to critical care medicine for intensive blood pressure control and stroke monitoring.  When I arrived he was initially calm, able to cooperate fully with neurological exam as below, however he shortly thereafter became very agitated. He had already received multiple doses of ativan and he was subsequently given haldol 5 followed by 10 mg before becoming calm.   I spoke with his mother who stated he complained of minor abdominal pain, but otherwiseno fevers, chills, sore throat, myalgias, nausea, vomiting, diarrhea, shortness of breath. She states that he has a chronic "smoker's cough" that is unchanged from his baseline  TRH pickup 06/18/2019  06/19/2019 patient is ambulating in the room feels well missing his cats PlayStation and his  bed anxious to go home Assessment & Plan:   Active Problems:   Neurologic deficit due to acute ischemic cerebrovascular accident (CVA) ([employer])   Stroke (cerebrum) (HCC)   Cerebral infarction (HCC)   Leukocytosis   Diabetes mellitus type I ([place])   AKI (acute kidney injury) ([place])   CKD (chronic kidney disease), stage III (HCC)   Agitation   Supraventricular tachycardia ([place])   Hypertensive urgency   #1 DKA with type 1 diabetes-off of insulin drip since last evening started on NPH 15 units twice a day.  Patient reports he took NPH 30 units daily with short-acting insulin 3 times a day.  Blood sugars are  controlled on the current regimen continue.  #2 large left PCA infarct await TEE.  Transthoracic echo ejection fraction 50 to 55% could not exclude PFO but no definite left-to-right shunt patient awaiting transesophageal echo.  MRI MRA of the brain shows large left PCA infarct with severe stenosis of the left posterior temporal artery branch of left PCA.  CT head showed large PCA infarct on the left.  Continue aspirin and Plavix.  He was COVID negative.  #3 substance abuse urine drug screen positive for THC  #4 tobacco abuse counseled against the use of tobacco.  #5 sinus tachycardia with uncontrolled hypertension on carvedilol 25 mg twice a day and amlodipine 10 mg daily.  #6 hypertension uncontrolled continue above medications  #7 hyperlipidemia continue Lipitor  #8 AKI it is unknown what his baseline creatinine is however with uncontrolled diabetes he most likely had higher creatinine and possible CKD.  BMP pending for today.  D VT prophylaxis Lovenox CODE STATUS full code Disposition plan Home tomorrow after TEE if all stable Family communication none patient prefers that I do not call anyone else  Estimated body mass index is 19.59 kg/m as calculated from the following:   Height as of this encounter: 5\' 3"  (1.6 m).   Weight as of this encounter: 50.2 kg.    Subjective:  Patient is awake alert up and around in the room anxious to go home Objective: Vitals:   06/18/19 0544 06/18/19 1523 06/18/19 2136 06/19/19 0630  BP: (!) 148/110 (!) 158/108 (!) 153/100 (!) 183/120  Pulse: 92 97 90 84  Resp: 20 15  15  Temp: 98.5 F (36.9 C) 97.8 F (36.6 C) 98.4 F (36.9 C) 97.9 F (36.6 C)  TempSrc: Oral Oral Oral Oral  SpO2: 100% 100% 100% 100%  Weight: 50.3 kg   50.2 kg  Height:        Intake/Output Summary (Last 24 hours) at 06/19/2019 1045 Last data filed at 06/18/2019 2139 Gross per 24 hour  Intake 240 ml  Output 300 ml  Net -60 ml   Filed Weights   06/17/19 0533  06/18/19 0544 06/19/19 0630  Weight: 50.3 kg 50.3 kg 50.2 kg    Examination:  General exam: Appears calm and comfortable  Respiratory system: Clear to auscultation. Respiratory effort normal. Cardiovascular system: S1 & S2 heard, RRR. No JVD, murmurs, rubs, gallops or clicks. No pedal edema. Gastrointestinal system: Abdomen is nondistended, soft and nontender. No organomegaly or masses felt. Normal bowel sounds heard. Central nervous system: Alert and oriented. No focal neurological deficits. Extremities: Symmetric 5 x 5 power. Skin: No rashes, lesions or ulcers Psychiatry: Judgement and insight appear normal. Mood & affect appropriate.     Data Reviewed: I have personally reviewed following labs and imaging studies  CBC: Recent Labs  Lab 06/12/19 2305  06/13/19 0251 06/14/19 0218 06/15/19 0158 06/16/19 0521 06/18/19 0103  WBC 21.9*  --  35.6* 14.2* 17.1* 13.9* 14.1*  NEUTROABS 19.1*  --   --   --   --   --   --   HGB 17.1*   < > 14.2 11.5* 14.1 13.2 12.5*  HCT 48.2   < > 39.8 33.7* 40.8 38.9* 35.9*  MCV 85.9  --  86.9 88.5 88.5 89.6 88.0  PLT 335  --  274 196 257 272 239   < > = values in this interval not displayed.   Basic Metabolic Panel: Recent Labs  Lab 06/12/19 2305  06/14/19 0218 06/15/19 0158 06/16/19 0521 06/17/19 0532 06/17/19 0623 06/18/19 0103 06/18/19 1321  NA 134*   < > 140 143 136 138  --  131* 139  K 3.2*   < > 3.6 3.7 3.9 4.6  --  4.3 3.6  CL 98   < > 111 116* 109 107  --  101 109  CO2 23   < > 22 19* 18* 21*  --  19* 22  GLUCOSE 132*   < > 105* 66* 298* 400* 399* 475* 126*  BUN 14   < > 12 17 20  23*  --  32* 27*  CREATININE 2.23*   < > 2.24* 2.26* 2.59* 2.46*  --  2.36* 2.27*  CALCIUM 9.6   < > 8.0* 8.6* 8.5* 8.5*  --  8.2* 8.3*  MG 1.9  --  2.1 2.0 1.7  --   --   --   --   PHOS 2.4*  --  3.7 4.4  --   --   --   --   --    < > = values in this interval not displayed.   GFR: Estimated Creatinine Clearance: 33.2 mL/min (A) (by C-G formula  based on SCr of 2.27 mg/dL (H)). Liver Function Tests: Recent Labs  Lab 06/12/19 2305  AST 22  ALT 16  ALKPHOS 128*  BILITOT 0.4  PROT 7.2  ALBUMIN 3.7   No results for input(s): LIPASE, AMYLASE in the last 168 hours. No results for input(s): AMMONIA in the last 168 hours. Coagulation Profile: Recent Labs  Lab 06/12/19 2305  INR 0.9   Cardiac Enzymes: Recent Labs  Lab 06/14/19 1446 06/14/19 1925 06/15/19 0158  TROPONINI 0.24* 0.28* 0.34*   BNP (last 3 results) No results for input(s): PROBNP in the last 8760 hours. HbA1C: No results for input(s): HGBA1C in the last 72 hours. CBG: Recent Labs  Lab 06/18/19 1637 06/18/19 1800 06/18/19 2137 06/19/19 0048 06/19/19 0727  GLUCAP 168* 92 122* 145* 82   Lipid Profile: No results for input(s): CHOL, HDL, LDLCALC, TRIG, CHOLHDL, LDLDIRECT in the last 72 hours. Thyroid Function Tests: No results for input(s): TSH, T4TOTAL, FREET4, T3FREE, THYROIDAB in the last 72 hours. Anemia Panel: No results for input(s): VITAMINB12, FOLATE, FERRITIN, TIBC, IRON, RETICCTPCT in the last 72 hours. Sepsis Labs: Recent Labs  Lab 06/12/19 2305 06/13/19 0251 06/14/19 1053  LATICACIDVEN 2.5* 6.5* 1.7    Recent Results (from the past 240 hour(s))  Novel Coronavirus,NAA,(SEND-OUT TO REF LAB - TAT 24-48 hrs); Hosp Order     Status: None   Collection Time: 06/13/19  1:25 AM   Specimen: Nasopharyngeal Swab; Respiratory  Result Value Ref Range Status   SARS-CoV-2, NAA NOT DETECTED NOT DETECTED Final    Comment: (NOTE) This test was developed and its performance characteristics determined by [employer]. This test has not been FDA cleared or approved. This test has been authorized by FDA under an Emergency Use Authorization (EUA). This test is only authorized for the duration of time the declaration that circumstances exist justifying the authorization of the emergency use of in vitro diagnostic tests for detection of  SARS-CoV-2 virus and/or diagnosis of COVID-19 infection under section 564(b)(1) of the Act, 21 U.S.C. 081KGY-1(E)(5), unless the authorization is terminated or revoked sooner. When diagnostic testing is negative, the possibility of a false negative result should be considered in the context of a patient's recent exposures and the presence of clinical signs and symptoms consistent with COVID-19. An individual without symptoms of COVID-19 and who is not shedding SARS-CoV-2 virus would expect to have a negative (not detected) result in this assay. Performed  At: [facility] [address] [place], [place] [id number] Rush Farmer MD VZ:[id number]    [place]  Final    Comment: Performed at [facility], [place] [address] [place], [place] [address]  MRSA PCR Screening     Status: None   Collection Time: 06/13/19  3:46 AM   Specimen: Nasopharyngeal  Result Value Ref Range Status   MRSA by PCR NEGATIVE NEGATIVE Final    Comment:        The GeneXpert MRSA Assay (FDA approved for NASAL specimens only), is one component of a comprehensive MRSA colonization surveillance program. It is not intended to diagnose MRSA infection nor to guide or monitor treatment for MRSA infections. Performed at [facility], [place] [address] [place], [place] [address]   Culture, blood (Routine X 2) w Reflex to ID Panel     Status: None   Collection Time: 06/13/19 10:00 AM   Specimen: Left Antecubital; Blood  Result Value Ref Range Status   Specimen Description LEFT ANTECUBITAL  Final   Special Requests   Final    BOTTLES DRAWN AEROBIC ONLY Blood Culture adequate volume   Culture   Final    NO GROWTH 5 DAYS Performed at [facility], [place] [address], [place], [place] [address]    Report Status 06/18/2019 FINAL  Final  Culture, blood (Routine X 2) w Reflex to ID Panel     Status: None   Collection Time: 06/13/19 10:05  AM   Specimen: Left Antecubital;  Blood  Result Value Ref Range Status   Specimen Description LEFT ANTECUBITAL  Final   Special Requests   Final    BOTTLES DRAWN AEROBIC ONLY Blood Culture adequate volume   Culture   Final    NO GROWTH 5 DAYS Performed at [facility], [address] [place], [place] [address]    Report Status 06/18/2019 FINAL  Final         Radiology Studies: No results found.      Scheduled Meds: . amLODipine  10 mg Oral Daily  . aspirin EC  325 mg Oral Daily  . atorvastatin  40 mg Oral q1800  . carvedilol  25 mg Oral BID WC  . Chlorhexidine Gluconate Cloth  6 each Topical Daily  . cloNIDine  0.1 mg Transdermal Weekly  . clopidogrel  75 mg Oral Daily  . enoxaparin (LOVENOX) injection  40 mg Subcutaneous Q24H  . insulin aspart  0-15 Units Subcutaneous TID WC  . insulin aspart  0-5 Units Subcutaneous QHS  . insulin NPH Human  15 Units Subcutaneous BID WC   Continuous Infusions: . sodium chloride Stopped (06/13/19 1639)  . sodium chloride 50 mL/hr at 06/18/19 1932  . dextrose 5 % and 0.45% NaCl 10 mL/hr at 06/17/19 1731     LOS: 6 days     Georgette Shell, MD [facility]  If 7PM-7AM, please contact night-coverage [email or web address] Password [facility] 06/19/2019, 10:45 AM

## 2019-06-19 NOTE — H&P (View-Only) (Signed)
Progress Note  Patient Name: George [name] Date of Encounter: 06/19/2019  Primary Cardiologist: No primary care provider on file.   Subjective   BP remains high. Tachycardic today. Blood sugars have improved yesterday, now FBG of 82 this am.  Inpatient Medications    Scheduled Meds: . amLODipine  10 mg Oral Daily  . aspirin EC  325 mg Oral Daily  . atorvastatin  40 mg Oral q1800  . carvedilol  25 mg Oral BID WC  . Chlorhexidine Gluconate Cloth  6 each Topical Daily  . clopidogrel  75 mg Oral Daily  . enoxaparin (LOVENOX) injection  40 mg Subcutaneous Q24H  . insulin aspart  0-15 Units Subcutaneous TID WC  . insulin aspart  0-5 Units Subcutaneous QHS  . insulin NPH Human  15 Units Subcutaneous BID WC   Continuous Infusions: . sodium chloride Stopped (06/13/19 1639)  . sodium chloride 50 mL/hr at 06/18/19 1932  . dextrose 5 % and 0.45% NaCl 10 mL/hr at 06/17/19 1731   PRN Meds: sodium chloride, acetaminophen **OR** [DISCONTINUED] acetaminophen (TYLENOL) oral liquid 160 mg/5 mL **OR** acetaminophen, fentaNYL (SUBLIMAZE) injection, labetalol, LORazepam, metoprolol tartrate, senna-docusate   Vital Signs    Vitals:   06/18/19 0544 06/18/19 1523 06/18/19 2136 06/19/19 0630  BP: (!) 148/110 (!) 158/108 (!) 153/100 (!) 183/120  Pulse: 92 97 90 84  Resp: _0 Temp: 98.5 F (36.9 C) 97.8 F (36.6 C) 98.4 F (36.9 C) 97.9 F (36.6 C)  TempSrc: Oral Oral Oral Oral  SpO2: 100% 100% 100% 100%  Weight: 50.3 kg   50.2 kg  Height:        Intake/Output Summary (Last 24 hours) at 06/19/2019 0910 Last data filed at 06/18/2019 2139 Gross per 24 hour  Intake 240 ml  Output 300 ml  Net -60 ml   Filed Weights   06/17/19 0533 06/18/19 0544 06/19/19 0630  Weight: 50.3 kg 50.3 kg 50.2 kg    Telemetry    Sinus tachycardia - Personally Reviewed  ECG    No new EKG to review - Personally Reviewed  Physical Exam   General appearance: alert, no distress and thin  Neck: no carotid bruit, no JVD and thyroid not enlarged, symmetric, no tenderness/mass/nodules Lungs: clear to auscultation bilaterally Heart: regular tachycardia Abdomen: soft, non-tender; bowel sounds normal; no masses,  no organomegaly Extremities: extremities normal, atraumatic, no cyanosis or edema Pulses: 2+ and symmetric Skin: Skin color, texture, turgor normal. No rashes or lesions Neurologic: Grossly normal Psych: Impatient   Labs    Chemistry Recent Labs  Lab 06/12/19 2305  06/17/19 0532 06/17/19 7616 06/18/19 0103 06/18/19 1321  NA 134*   < > 138  --  131* 139  K 3.2*   < > 4.6  --  4.3 3.6  CL 98   < > 107  --  101 109  CO2 23   < > 21*  --  19* 22  GLUCOSE 132*   < > 400* 399* 475* 126*  BUN 14   < > 23*  --  32* 27*  CREATININE 2.23*   < > 2.46*  --  2.36* 2.27*  CALCIUM 9.6   < > 8.5*  --  8.2* 8.3*  PROT 7.2  --   --   --   --   --   ALBUMIN 3.7  --   --   --   --   --   AST 22  --   --   --   --   --  ALT 16  --   --   --   --   --   ALKPHOS 128*  --   --   --   --   --   BILITOT 0.4  --   --   --   --   --   GFRNONAA 38*   < > 33*  --  35* 37*  GFRAA 44*   < > 39*  --  41* 43*  ANIONGAP 13   < > 10  --  11 8   < > = values in this interval not displayed.     Hematology Recent Labs  Lab 06/15/19 0158 06/16/19 0521 06/18/19 0103  WBC 17.1* 13.9* 14.1*  RBC 4.61 4.34 4.08*  HGB 14.1 13.2 12.5*  HCT 40.8 38.9* 35.9*  MCV 88.5 89.6 88.0  MCH 30.6 30.4 30.6  MCHC 34.6 33.9 34.8  RDW 13.4 13.4 12.7  PLT 257 272 239    Cardiac Enzymes Recent Labs  Lab 06/14/19 1446 06/14/19 1925 06/15/19 0158  TROPONINI 0.24* 0.28* 0.34*   No results for input(s): TROPIPOC in the last 168 hours.   BNPNo results for input(s): BNP, PROBNP in the last 168 hours.   DDimer No results for input(s): DDIMER in the last 168 hours.   Radiology    No results found.  Cardiac Studies   2D echo 06/15/2019 IMPRESSIONS   1. The left ventricle has low normal  systolic function, with an ejection fraction of 50-55%. The cavity size was normal. Indeterminate diastolic filling due to E-A fusion.  2. The right ventricle has normal systolic function. The cavity was normal. There is no increase in right ventricular wall thickness.  3. Atrial septal color Doppler challenging to interpreter. Cannot exclude PFO though no definite L to R shunt detected. Consider transthoracic agitated saline exam.  4. No intracardiac thrombi or masses were visualized.  Patient Profile     33 y.o. male history of Dm1 admitted with slurred speech and balance issues at home along with headache. Found by neurology to have receptive aphasia and diagnosed with left occipital temoral lobse CVA, outside of tpa window. Later during admission became severely agitated, given several rounds of ativa, haldol. Significant HTN and tachycardia has been an ongoing issue even with resolution of agitation. Cardiology consulted to help manage.  Assessment & Plan    1.  Severe tachycardia -initial EKG show sinus tach 120s-150s, as well as a narrow complex regular tach 170s - tele reviewed, severe narrow complex tach 170s that looks to be sinus tach. Following the rhythms you can see where sinus tach speeds up and slow downs, there is no abrupt onset as would be seen with an SVT.  - unclear etiology, perhaps sympathetic hyperactivity/autonomic instability after his CVA. -His UDS was negative and he denies any EtOH use and has no other signs of EtOH withdrawal.  -no signicant anemia, normal TSH, not hypoxic, comfortable not in pain, not currently agitated. Nothing to support tachy from PE or pericardial effusion. Essentially looks for some reason to have autonomic/sympathetic surge. -initially on Esmolol gtt but transitioned to lopressor -24 hour urine for catecholamines pending to rule out pheo given marked tachycardia and hypertension with unknown etiology  -HR improved to the 90's after increasing  lopressor to 5m BID but 115bpm this am but has not received his am lopressor.   - changed to carvedilol 25 mg BID  2.  Hypertensive urgency -BP elevated early this am at 176/1031mg but  has not gotten am meds -Clevidipine gtt (dihydropiridine CCB) stopped and now on amlodipine 64m daily and lopressor 566mBID - Echo shows low normal LVEF 50-55% - will switch metoprolol to cardvedilol 25 mg BID today - Urinary metanephrines and cortisol ordered, awaiting result of 24hr urine collection, consider abdominal MRI to r/o adrenal adenoma - Start catapres TTS1 patch q weekly  3.  Acute CVA -TEE postponed until Monday -ESR and CRP elevated -vasculitis workup in progress  -possible PFO on TTE - will need bubble study today with TEE -continue DAPT with ASA and plavix -continue statin  4.  Acute Kidney injury -secondary to severe hypertension -Creatinine on admit was 2.2>2.54>2.26>2.59>2.46  5.  Elevated troponin -mildly elevated with flat trend -c/w demand ischemia in the setting of hypertensive urgency/CVA and tachycardia -2D echo with normal LVF  6. Insulin dependent diabetes - has been on insulin gtts - BG's remain poorly controlled, need more aggressive therapy - BG's improved, now on BID NPH insulin   For questions or updates, please contact [facility] consult [email or web address] for contact info under Cardiology/STEMI.   KePixie CasinoMD, FACrossroads Community HospitalFAWest Tawakoniirector of the [facility] Lipid [facility]  Cardiovascular Risk Reduction [facility] Diplomate of the American Board of Clinical Lipidology Attending Cardiologist  Direct Dial: [phone]: [phone]:  [email or web address]  KePixie CasinoMD  06/19/2019, 9:10 AM

## 2019-06-19 NOTE — Progress Notes (Signed)
 Progress Note  Patient Name: George [name] Date of Encounter: 06/19/2019  Primary Cardiologist: No primary care provider on file.   Subjective   BP remains high. Tachycardic today. Blood sugars have improved yesterday, now FBG of 82 this am.  Inpatient Medications    Scheduled Meds: . amLODipine  10 mg Oral Daily  . aspirin EC  325 mg Oral Daily  . atorvastatin  40 mg Oral q1800  . carvedilol  25 mg Oral BID WC  . Chlorhexidine Gluconate Cloth  6 each Topical Daily  . clopidogrel  75 mg Oral Daily  . enoxaparin (LOVENOX) injection  40 mg Subcutaneous Q24H  . insulin aspart  0-15 Units Subcutaneous TID WC  . insulin aspart  0-5 Units Subcutaneous QHS  . insulin NPH Human  15 Units Subcutaneous BID WC   Continuous Infusions: . sodium chloride Stopped (06/13/19 1639)  . sodium chloride 50 mL/hr at 06/18/19 1932  . dextrose 5 % and 0.45% NaCl 10 mL/hr at 06/17/19 1731   PRN Meds: sodium chloride, acetaminophen **OR** [DISCONTINUED] acetaminophen (TYLENOL) oral liquid 160 mg/5 mL **OR** acetaminophen, fentaNYL (SUBLIMAZE) injection, labetalol, LORazepam, metoprolol tartrate, senna-docusate   Vital Signs    Vitals:   06/18/19 0544 06/18/19 1523 06/18/19 2136 06/19/19 0630  BP: (!) 148/110 (!) 158/108 (!) 153/100 (!) 183/120  Pulse: 92 97 90 84  Resp: 20 15  15  Temp: 98.5 F (36.9 C) 97.8 F (36.6 C) 98.4 F (36.9 C) 97.9 F (36.6 C)  TempSrc: Oral Oral Oral Oral  SpO2: 100% 100% 100% 100%  Weight: 50.3 kg   50.2 kg  Height:        Intake/Output Summary (Last 24 hours) at 06/19/2019 0910 Last data filed at 06/18/2019 2139 Gross per 24 hour  Intake 240 ml  Output 300 ml  Net -60 ml   Filed Weights   06/17/19 0533 06/18/19 0544 06/19/19 0630  Weight: 50.3 kg 50.3 kg 50.2 kg    Telemetry    Sinus tachycardia - Personally Reviewed  ECG    No new EKG to review - Personally Reviewed  Physical Exam   General appearance: alert, no distress and thin  Neck: no carotid bruit, no JVD and thyroid not enlarged, symmetric, no tenderness/mass/nodules Lungs: clear to auscultation bilaterally Heart: regular tachycardia Abdomen: soft, non-tender; bowel sounds normal; no masses,  no organomegaly Extremities: extremities normal, atraumatic, no cyanosis or edema Pulses: 2+ and symmetric Skin: Skin color, texture, turgor normal. No rashes or lesions Neurologic: Grossly normal Psych: Impatient   Labs    Chemistry Recent Labs  Lab 06/12/19 2305  06/17/19 0532 06/17/19 0623 06/18/19 0103 06/18/19 1321  NA 134*   < > 138  --  131* 139  K 3.2*   < > 4.6  --  4.3 3.6  CL 98   < > 107  --  101 109  CO2 23   < > 21*  --  19* 22  GLUCOSE 132*   < > 400* 399* 475* 126*  BUN 14   < > 23*  --  32* 27*  CREATININE 2.23*   < > 2.46*  --  2.36* 2.27*  CALCIUM 9.6   < > 8.5*  --  8.2* 8.3*  PROT 7.2  --   --   --   --   --   ALBUMIN 3.7  --   --   --   --   --   AST 22  --   --   --   --   --     ALT 16  --   --   --   --   --   ALKPHOS 128*  --   --   --   --   --   BILITOT 0.4  --   --   --   --   --   GFRNONAA 38*   < > 33*  --  35* 37*  GFRAA 44*   < > 39*  --  41* 43*  ANIONGAP 13   < > 10  --  11 8   < > = values in this interval not displayed.     Hematology Recent Labs  Lab 06/15/19 0158 06/16/19 0521 06/18/19 0103  WBC 17.1* 13.9* 14.1*  RBC 4.61 4.34 4.08*  HGB 14.1 13.2 12.5*  HCT 40.8 38.9* 35.9*  MCV 88.5 89.6 88.0  MCH 30.6 30.4 30.6  MCHC 34.6 33.9 34.8  RDW 13.4 13.4 12.7  PLT 257 272 239    Cardiac Enzymes Recent Labs  Lab 06/14/19 1446 06/14/19 1925 06/15/19 0158  TROPONINI 0.24* 0.28* 0.34*   No results for input(s): TROPIPOC in the last 168 hours.   BNPNo results for input(s): BNP, PROBNP in the last 168 hours.   DDimer No results for input(s): DDIMER in the last 168 hours.   Radiology    No results found.  Cardiac Studies   2D echo 06/15/2019 IMPRESSIONS   1. The left ventricle has low normal  systolic function, with an ejection fraction of 50-55%. The cavity size was normal. Indeterminate diastolic filling due to E-A fusion.  2. The right ventricle has normal systolic function. The cavity was normal. There is no increase in right ventricular wall thickness.  3. Atrial septal color Doppler challenging to interpreter. Cannot exclude PFO though no definite L to R shunt detected. Consider transthoracic agitated saline exam.  4. No intracardiac thrombi or masses were visualized.  Patient Profile     32 y.o. male history of Dm1 admitted with slurred speech and balance issues at home along with headache. Found by neurology to have receptive aphasia and diagnosed with left occipital temoral lobse CVA, outside of tpa window. Later during admission became severely agitated, given several rounds of ativa, haldol. Significant HTN and tachycardia has been an ongoing issue even with resolution of agitation. Cardiology consulted to help manage.  Assessment & Plan    1.  Severe tachycardia -initial EKG show sinus tach 120s-150s, as well as a narrow complex regular tach 170s - tele reviewed, severe narrow complex tach 170s that looks to be sinus tach. Following the rhythms you can see where sinus tach speeds up and slow downs, there is no abrupt onset as would be seen with an SVT.  - unclear etiology, perhaps sympathetic hyperactivity/autonomic instability after his CVA. -His UDS was negative and he denies any EtOH use and has no other signs of EtOH withdrawal.  -no signicant anemia, normal TSH, not hypoxic, comfortable not in pain, not currently agitated. Nothing to support tachy from PE or pericardial effusion. Essentially looks for some reason to have autonomic/sympathetic surge. -initially on Esmolol gtt but transitioned to lopressor -24 hour urine for catecholamines pending to rule out pheo given marked tachycardia and hypertension with unknown etiology  -HR improved to the 90's after increasing  lopressor to 50mg BID but 115bpm this am but has not received his am lopressor.   - changed to carvedilol 25 mg BID  2.  Hypertensive urgency -BP elevated early this am at 176/105mmHg but   has not gotten am meds -Clevidipine gtt (dihydropiridine CCB) stopped and now on amlodipine 10mg daily and lopressor 50mg BID - Echo shows low normal LVEF 50-55% - will switch metoprolol to cardvedilol 25 mg BID today - Urinary metanephrines and cortisol ordered, awaiting result of 24hr urine collection, consider abdominal MRI to r/o adrenal adenoma - Start catapres TTS1 patch q weekly  3.  Acute CVA -TEE postponed until Monday -ESR and CRP elevated -vasculitis workup in progress  -possible PFO on TTE - will need bubble study today with TEE -continue DAPT with ASA and plavix -continue statin  4.  Acute Kidney injury -secondary to severe hypertension -Creatinine on admit was 2.2>2.54>2.26>2.59>2.46  5.  Elevated troponin -mildly elevated with flat trend -c/w demand ischemia in the setting of hypertensive urgency/CVA and tachycardia -2D echo with normal LVF  6. Insulin dependent diabetes - has been on insulin gtts - BG's remain poorly controlled, need more aggressive therapy - BG's improved, now on BID NPH insulin   For questions or updates, please contact [facility] Please consult [email or web address] for contact info under Cardiology/STEMI.   Kenneth C. Hilty, MD, FACC, FACP  [place]  [facility]  Medical Director of the [facility] Diplomate of the American Board of Clinical Lipidology Attending Cardiologist  Direct Dial: [phone]  Fax: [phone]  Website:  [email or web address]  Kenneth C Hilty, MD  06/19/2019, 9:10 AM   

## 2019-06-20 ENCOUNTER — Inpatient Hospital Stay (HOSPITAL_COMMUNITY): Payer: Medicaid Other

## 2019-06-20 ENCOUNTER — Encounter (HOSPITAL_COMMUNITY): Payer: Self-pay

## 2019-06-20 ENCOUNTER — Inpatient Hospital Stay (HOSPITAL_COMMUNITY): Payer: Medicaid Other | Admitting: Certified Registered"

## 2019-06-20 ENCOUNTER — Encounter (HOSPITAL_COMMUNITY)
Admission: EM | Disposition: A | Discharge status: Home / Self Care | Payer: Self-pay | Source: Home / Self Care | Attending: Pulmonary Disease

## 2019-06-20 DIAGNOSIS — I639 Cerebral infarction, unspecified: Secondary | ICD-10-CM

## 2019-06-20 DIAGNOSIS — I6300 Cerebral infarction due to thrombosis of unspecified precerebral artery: Secondary | ICD-10-CM

## 2019-06-20 HISTORY — PX: BUBBLE STUDY: SHX6837

## 2019-06-20 HISTORY — PX: TEE WITHOUT CARDIOVERSION: SHX5443

## 2019-06-20 LAB — BASIC METABOLIC PANEL
Anion gap: 7 (ref 5–15)
BUN: 27 mg/dL — ABNORMAL HIGH (ref 6–20)
CO2: 22 mmol/L (ref 22–32)
Calcium: 8.2 mg/dL — ABNORMAL LOW (ref 8.9–10.3)
Chloride: 111 mmol/L (ref 98–111)
Creatinine, Ser: 1.92 mg/dL — ABNORMAL HIGH (ref 0.61–1.24)
GFR calc Af Amer: 52 mL/min — ABNORMAL LOW (ref >60)
GFR calc non Af Amer: 45 mL/min — ABNORMAL LOW (ref >60)
Glucose, Bld: 121 mg/dL — ABNORMAL HIGH (ref 70–99)
Potassium: 3.5 mmol/L (ref 3.5–5.1)
Sodium: 140 mmol/L (ref 135–145)

## 2019-06-20 LAB — GLUCOSE, CAPILLARY
Glucose-Capillary: 133 mg/dL — ABNORMAL HIGH (ref 70–99)
Glucose-Capillary: 196 mg/dL — ABNORMAL HIGH (ref 70–99)
Glucose-Capillary: 221 mg/dL — ABNORMAL HIGH (ref 70–99)
Glucose-Capillary: 37 mg/dL — CL (ref 70–99)
Glucose-Capillary: 22 mg/dL — CL (ref 70–99)
Glucose-Capillary: 42 mg/dL — CL (ref 70–99)
Glucose-Capillary: 55 mg/dL — ABNORMAL LOW (ref 70–99)
Glucose-Capillary: 93 mg/dL (ref 70–99)
Glucose-Capillary: 78 mg/dL (ref 70–99)

## 2019-06-20 SURGERY — ECHOCARDIOGRAM, TRANSESOPHAGEAL
Anesthesia: Monitor Anesthesia Care

## 2019-06-20 MED ORDER — INSULIN NPH (HUMAN) (ISOPHANE) 100 UNIT/ML ~~LOC~~ SUSP: 15.0000 [IU] | Freq: Two times a day (BID) | SUBCUTANEOUS | 11 refills | Status: DC

## 2019-06-20 MED ORDER — CLOPIDOGREL BISULFATE 75 MG PO TABS: 75.0000 mg | ORAL_TABLET | Freq: Every day | ORAL | 2 refills | Status: DC

## 2019-06-20 MED ORDER — PROPOFOL 10 MG/ML IV BOLUS
INTRAVENOUS | Status: DC | PRN
  Administered 2019-06-20: 40 mg via INTRAVENOUS
  Administered 2019-06-20: 60 mg via INTRAVENOUS

## 2019-06-20 MED ORDER — AMLODIPINE BESYLATE 10 MG PO TABS: 10.0000 mg | ORAL_TABLET | Freq: Every day | ORAL | 1 refills | Status: DC

## 2019-06-20 MED ORDER — CLONIDINE 0.1 MG/24HR TD PTWK: 0.1000 mg | MEDICATED_PATCH | TRANSDERMAL | 12 refills | Status: DC

## 2019-06-20 MED ORDER — ATORVASTATIN CALCIUM 40 MG PO TABS: 40.0000 mg | ORAL_TABLET | Freq: Every day | ORAL | 1 refills | Status: DC

## 2019-06-20 MED ORDER — CARVEDILOL 25 MG PO TABS: 25.0000 mg | ORAL_TABLET | Freq: Two times a day (BID) | ORAL | 1 refills | Status: DC

## 2019-06-20 MED ORDER — ASPIRIN 325 MG PO TBEC: 325.0000 mg | DELAYED_RELEASE_TABLET | Freq: Every day | ORAL | 0 refills | Status: DC

## 2019-06-20 MED ORDER — LABETALOL HCL 5 MG/ML IV SOLN
INTRAVENOUS | Status: AC
  Filled 2019-06-20: qty 4

## 2019-06-20 MED ORDER — SODIUM CHLORIDE 0.9 % IV SOLN
INTRAVENOUS | Status: DC
  Administered 2019-06-20: 11:00:00 via INTRAVENOUS

## 2019-06-20 MED ORDER — DEXTROSE 50 % IV SOLN
INTRAVENOUS | Status: AC
  Filled 2019-06-20: qty 50

## 2019-06-20 MED ORDER — DEXTROSE 50 % IV SOLN
25.0000 mL | Freq: Once | INTRAVENOUS | Status: AC
  Administered 2019-06-20 (×2): 25 mL via INTRAVENOUS

## 2019-06-20 MED ORDER — PROPOFOL 500 MG/50ML IV EMUL
INTRAVENOUS | Status: DC | PRN
  Administered 2019-06-20: 100 ug/kg/min via INTRAVENOUS

## 2019-06-20 MED ORDER — LIDOCAINE 2% (20 MG/ML) 5 ML SYRINGE
INTRAMUSCULAR | Status: DC | PRN
  Administered 2019-06-20: 40 mg via INTRAVENOUS

## 2019-06-20 MED ORDER — INSULIN REGULAR HUMAN 100 UNIT/ML IJ SOLN: 5.0000 [IU] | Freq: Three times a day (TID) | INTRAMUSCULAR | 6 refills | Status: DC

## 2019-06-20 MED ORDER — DEXTROSE 50 % IV SOLN
INTRAVENOUS | Status: AC
  Administered 2019-06-20: 25 mL via INTRAVENOUS
  Filled 2019-06-20: qty 50

## 2019-06-20 MED FILL — CARVEDILOL 25 MG TABLET: 25 | 30 days supply | Qty: 60 | Fill #0

## 2019-06-20 MED FILL — CLOPIDOGREL 75 MG TABLET: 75 | 30 days supply | Qty: 30 | Fill #0

## 2019-06-20 MED FILL — HumuLIN N 100 UNIT/ML SUSP: 100 | 28 days supply | Qty: 10 | Fill #0

## 2019-06-20 MED FILL — CLONIDINE 0.1 MG/24HR PTWK: 0.1 | 28 days supply | Qty: 4 | Fill #0

## 2019-06-20 MED FILL — ATORVASTATIN CALCIUM 40 MG: 40 | 30 days supply | Qty: 30 | Fill #0

## 2019-06-20 NOTE — Progress Notes (Signed)
OT Cancellation Note  Patient Details Name: George [name] MRN: [id number] DOB: [birth year]   Cancelled Treatment:    Reason Eval/Treat Not Completed: Patient at procedure or test/ unavailable(TEE) Will continue to follow.  Malka So 06/20/2019, 10:54 AM  Nestor Lewandowsky, OTR/L Acute Rehabilitation Services Pager: [phone] Office: [phone]

## 2019-06-20 NOTE — Progress Notes (Addendum)
 Progress Note  Patient Name: George [name] Date of Encounter: 06/20/2019  Primary Cardiologist: New to [facility] (may be followed at [place])  Subjective   Feeling well. No chest pain, sob or palpitations.   Inpatient Medications    Scheduled Meds: . amLODipine  10 mg Oral Daily  . aspirin EC  325 mg Oral Daily  . atorvastatin  40 mg Oral q1800  . carvedilol  25 mg Oral BID WC  . Chlorhexidine Gluconate Cloth  6 each Topical Daily  . cloNIDine  0.1 mg Transdermal Weekly  . clopidogrel  75 mg Oral Daily  . enoxaparin (LOVENOX) injection  40 mg Subcutaneous Q24H  . insulin aspart  0-15 Units Subcutaneous TID WC  . insulin aspart  0-5 Units Subcutaneous QHS  . insulin NPH Human  15 Units Subcutaneous BID WC   Continuous Infusions: . sodium chloride Stopped (06/13/19 1639)  . sodium chloride 50 mL/hr at 06/18/19 1932  . dextrose 5 % and 0.45% NaCl 10 mL/hr at 06/17/19 1731   PRN Meds: sodium chloride, acetaminophen **OR** [DISCONTINUED] acetaminophen (TYLENOL) oral liquid 160 mg/5 mL **OR** acetaminophen, fentaNYL (SUBLIMAZE) injection, labetalol, LORazepam, metoprolol tartrate, senna-docusate   Vital Signs    Vitals:   06/20/19 0352 06/20/19 0550 06/20/19 0737 06/20/19 0800  BP: (!) 163/96 (!) 167/110 (!) 158/109 (!) 171/99  Pulse:  95    Resp:  18    Temp:  98.2 F (36.8 C)    TempSrc:  Oral    SpO2:  100%  100%  Weight:  50.2 kg    Height:        Intake/Output Summary (Last 24 hours) at 06/20/2019 0912 Last data filed at 06/20/2019 0553 Gross per 24 hour  Intake 600 ml  Output 1700 ml  Net -1100 ml   Last 3 Weights 06/20/2019 06/19/2019 06/18/2019  Weight (lbs) 110 lb 11.2 oz 110 lb 9.6 oz 110 lb 12.8 oz  Weight (kg) 50.213 kg 50.168 kg 50.259 kg      Telemetry    Sr at 90s, intermittent tachy at 120s - Personally Reviewed  ECG    N/A  Physical Exam   GEN: No acute distress.   Neck: No JVD Cardiac: RRR, no murmurs, rubs, or gallops.   Respiratory: Clear to auscultation bilaterally. GI: Soft, nontender, non-distended  MS: No edema; No deformity. Neuro:  Nonfocal  Psych: Normal affect   Labs    Chemistry Recent Labs  Lab 06/18/19 1321 06/19/19 1011 06/20/19 0515  NA 139 138 140  K 3.6 3.7 3.5  CL 109 109 111  CO2 22 22 22  GLUCOSE 126* 125* 121*  BUN 27* 25* 27*  CREATININE 2.27* 2.24* 1.92*  CALCIUM 8.3* 8.2* 8.2*  GFRNONAA 37* 37* 45*  GFRAA 43* 43* 52*  ANIONGAP 8 7 7     Hematology Recent Labs  Lab 06/15/19 0158 06/16/19 0521 06/18/19 0103  WBC 17.1* 13.9* 14.1*  RBC 4.61 4.34 4.08*  HGB 14.1 13.2 12.5*  HCT 40.8 38.9* 35.9*  MCV 88.5 89.6 88.0  MCH 30.6 30.4 30.6  MCHC 34.6 33.9 34.8  RDW 13.4 13.4 12.7  PLT 257 272 239    Cardiac Enzymes Recent Labs  Lab 06/14/19 1446 06/14/19 1925 06/15/19 0158  TROPONINI 0.24* 0.28* 0.34*    Radiology    No results found.  Cardiac Studies   2D echo 06/15/2019 IMPRESSIONS  1. The left ventricle has low normal systolic function, with an ejection fraction of 50-55%. The cavity size   was normal. Indeterminate diastolic filling due to E-A fusion. 2. The right ventricle has normal systolic function. The cavity was normal. There is no increase in right ventricular wall thickness. 3. Atrial septal color Doppler challenging to interpreter. Cannot exclude PFO though no definite L to R shunt detected. Consider transthoracic agitated saline exam. 4. No intracardiac thrombi or masses were visualized.  Patient Profile     33 y.o. male history of Dm1 admitted with slurred speech and balance issues at home along with headache. Found by neurology to have receptive aphasia and diagnosed with left occipital temoral lobse CVA, outside of tpa window. Later during admission became severely agitated, given several rounds of ativa, haldol. Significant HTN and tachycardiahas been an ongoing issue even with resolution of agitation. Cardiology consulted to help  manage.  Assessment & Plan    1.  Severe tachycardia -Initial EKG show sinus tach 120s-150s, as well as a narrow complex regular tach 170s - Now rate improving to mostly 90s on coreg.   2.  Hypertensive urgency -BP remained elevated - Echo shows low normal LVEF 50-55% - Urinary metanephrines and cortisol ordered, awaiting result of 24hr urine collection, consider abdominal MRI to r/o adrenal adenoma - Will review medications with MD. ? Permissive hypertension  3.  Acute CVA -ESR and CRP elevated -vasculitis workup in progress  -possible PFO on TTE - will need bubble study with TEE today -continue DAPT with ASA and plavix -continue statin  4.  Acute Kidney injury -secondary to severe hypertension -Improving  5.  Elevated troponin -mildly elevated with flat trend -c/w demand ischemia in the setting of hypertensive urgency/CVA and tachycardia -2D echo with normal LVF  6. Insulin dependent diabetes - Per primary team   For questions or updates, please contact [facility] Please consult [email or web address] for contact info under        Signed, Bhavinkumar Bhagat, PA  06/20/2019, 9:12 AM    Personally seen and examined. Agree with above.   TEE unremarkable. EF 45-50% Continue Coreg for tachycardia. Rate improved  Will sign off please let us know if we can be of further assistance.   Mark Skains, MD  

## 2019-06-20 NOTE — Progress Notes (Signed)
Hypoglycemic Event  CBG: 55  Treatment: 8 oz Orange juice   Symptoms:Tachycardia, Diaphoresis  Follow-up CBG: ASUO:1561 CBG Result:78  Possible Reasons for Event: NPO  Diet order received for Carb Mod diet order. Pt has ordered lunch. Will continue to monitor.   Leonidas Romberg

## 2019-06-20 NOTE — Progress Notes (Signed)
  Echocardiogram Echocardiogram Transesophageal has been performed.  George [name] 06/20/2019, 12:28 PM

## 2019-06-20 NOTE — TOC Transition Note (Signed)
Transition of Care [facility]) - CM/SW Discharge Note   Patient Details  Name: Sparrow Sanzo MRN: [id number] Date of Birth: 1986-04-09  [facility] CM/SW Contact:  Bethena Roys, RN Phone Number: 06/20/2019, 3:48 PM   Clinical Narrative:   Pt presented for speech difficulty. PTA From home with parents supporting. Pt is without a PCP at this time. CM did offer to schedule appointment at the [facility] and [facility]. Appointment scheduled and placed on AVS. Patient can utilize the [facility] and medications will range from $4.00-$10.00. Match utilized at this time. No further home needs identified.    Final next level of care: Home/Self Care Barriers to Discharge: No Barriers Identified   Patient Goals and CMS Choice Patient states their goals for this hospitalization and ongoing recovery are:: "to feel better" CMS Medicare.gov Compare Post Acute Care list provided to:: (N/A) Choice offered to / list presented to : NA  Discharge Placement                       Discharge Plan and Services   Discharge Planning Services: CM Consult, [facility], [facility], Follow-up appt scheduled Post Acute Care Choice: NA                               Social Determinants of Health (SDOH) Interventions     Readmission Risk Interventions No flowsheet data found.

## 2019-06-20 NOTE — Progress Notes (Signed)
Patient finished procedure and was diaphoretic in recovery.  CBG of 22.  Anesthesia notified.  Half amp of D50 give.  Recheck at 15 minutes had CBG of 93.  Report called to floor RN.  She is aware and will recheck sugar again on his arrival to his room.

## 2019-06-20 NOTE — Progress Notes (Signed)
Inpatient Diabetes Program Recommendations  AACE/ADA: New Consensus Statement on Inpatient Glycemic Control (2015)  Target Ranges:  Prepandial:   less than 140 mg/dL      Peak postprandial:   less than 180 mg/dL (1-2 hours)      Critically ill patients:  140 - 180 mg/dL   Lab Results  Component Value Date   GLUCAP 78 06/20/2019   HGBA1C 8.4 (H) 06/13/2019    Inpatient Diabetes Program Recommendations:    Noted severe hypoglycemic event of 22 mg/dL during TEE following administration of NPH 15 units, as patient was NPO. Consider decreasing afternoon dose of NPH to 6 units x 1. Then resuming NPH 15 units BID starting on 6/23. MD to change orders. Thanks, Bronson Curb, MSN, RNC-OB Diabetes Coordinator [phone] (8a-5p)

## 2019-06-20 NOTE — Progress Notes (Addendum)
Hypoglycemic Event  CBG: 37  Treatment: 25g D50 IV  Symptoms: Tachycardia/ Diaphoresis    Follow-up CBG: Time: 10:08 CBG Result:196  Possible Reasons for Event: Pt NPO  Comments: Pt states he feels much better. No pain or distress noted post hypoglycemic treatment. Will continue to monitor.     George [name]

## 2019-06-20 NOTE — Anesthesia Preprocedure Evaluation (Signed)
Anesthesia Evaluation  Patient identified by MRN, date of birth, ID band Patient awake    Reviewed: Allergy & Precautions, H&P , NPO status , Patient's Chart, lab work & pertinent test results  Airway Mallampati: II   Neck ROM: full    Dental   Pulmonary    breath sounds clear to auscultation       Cardiovascular hypertension,  Rhythm:regular Rate:Normal     Neuro/Psych CVA    GI/Hepatic   Endo/Other  diabetes, Type 1  Renal/GU Renal InsufficiencyRenal disease     Musculoskeletal   Abdominal   Peds  Hematology   Anesthesia Other Findings   Reproductive/Obstetrics                             Anesthesia Physical Anesthesia Plan  ASA: II  Anesthesia Plan: MAC   Post-op Pain Management:    Induction: Intravenous  PONV Risk Score and Plan: 1 and Propofol infusion and Treatment may vary due to age or medical condition  Airway Management Planned: Nasal Cannula  Additional Equipment:   Intra-op Plan:   Post-operative Plan:   Informed Consent: I have reviewed the patients History and Physical, chart, labs and discussed the procedure including the risks, benefits and alternatives for the proposed anesthesia with the patient or authorized representative who has indicated his/her understanding and acceptance.       Plan Discussed with: CRNA, Anesthesiologist and Surgeon  Anesthesia Plan Comments:         Anesthesia Quick Evaluation

## 2019-06-20 NOTE — CV Procedure (Signed)
  TEE  Patient sedated by anesthesia with Propofol  TEE probe advanced to mid esophagus without difficulty  MV normal  Trace MR AV normal  No AI TV normal  Trace TR PV normal  Trace PI  LA, LAA without masses  Aorta normal  LV appears mildly hypertrophied   LVEF is approximately 45 to 50%   Procedure without complication.  Full report to follow  Dorris Carnes MD

## 2019-06-20 NOTE — Progress Notes (Signed)
Hypoglycemic Event  CBG: 22  Treatment: D50 25 mL (12.5 gm)  Symptoms: Pale and Sweaty  Follow-up CBG: Time:1235 CBG Result:96  Possible Reasons for Event: Inadequate meal intake  Comments/MD notified: Dr. Tobias Alexander notified by phone    Cheri Guppy

## 2019-06-20 NOTE — Anesthesia Postprocedure Evaluation (Signed)
Anesthesia Post Note  Patient: Severiano Curtiss  Procedure(s) Performed: TRANSESOPHAGEAL ECHOCARDIOGRAM (TEE) (N/A ) BUBBLE STUDY     Patient location during evaluation: PACU Anesthesia Type: MAC Level of consciousness: awake and alert Pain management: pain level controlled Vital Signs Assessment: post-procedure vital signs reviewed and stable Respiratory status: spontaneous breathing and respiratory function stable Cardiovascular status: stable Postop Assessment: no apparent nausea or vomiting Anesthetic complications: no    Last Vitals:  Vitals:   06/20/19 1235 06/20/19 1245  BP: 119/82 (!) 142/89  Pulse: 67 64  Resp: 16 12  Temp:    SpO2: 99% 100%    Last Pain:  Vitals:   06/20/19 1245  TempSrc:   PainSc: 0-No pain                 Jaloni Davoli DANIEL

## 2019-06-20 NOTE — Transfer of Care (Signed)
Immediate Anesthesia Transfer of Care Note  Patient: George [name]  Procedure(s) Performed: TRANSESOPHAGEAL ECHOCARDIOGRAM (TEE) (N/A ) BUBBLE STUDY  Patient Location: Endoscopy Unit  Anesthesia Type:MAC  Level of Consciousness: awake, alert  and oriented  Airway & Oxygen Therapy: Patient Spontanous Breathing and Patient connected to nasal cannula oxygen  Post-op Assessment: Report given to RN and Post -op Vital signs reviewed and stable  Post vital signs: Reviewed and stable  Last Vitals:  Vitals Value Taken Time  BP 125/76   Temp    Pulse 76 06/20/19 1207  Resp 12   SpO2 100 % 06/20/19 1207  Vitals shown include unvalidated device data.  Last Pain:  Vitals:   06/20/19 1050  TempSrc: Oral  PainSc: 0-No pain      Patients Stated Pain Goal: 0 (24/82/50 0370)  Complications: No apparent anesthesia complications

## 2019-06-20 NOTE — Anesthesia Procedure Notes (Signed)
Procedure Name: MAC Date/Time: 06/20/2019 11:52 AM Performed by: Orlie Dakin, CRNA Pre-anesthesia Checklist: Patient identified, Emergency Drugs available, Suction available and Patient being monitored Patient Re-evaluated:Patient Re-evaluated prior to induction Oxygen Delivery Method: Nasal cannula Preoxygenation: Pre-oxygenation with 100% oxygen Induction Type: IV induction

## 2019-06-20 NOTE — Interval H&P Note (Signed)
History and Physical Interval Note:  06/20/2019 9:25 AM  George [name]  has presented today for surgery, with the diagnosis of stroke.  The various methods of treatment have been discussed with the patient and family. After consideration of risks, benefits and other options for treatment, the patient has consented to  Procedure(s): TRANSESOPHAGEAL ECHOCARDIOGRAM (TEE) (N/A) as a surgical intervention.  The patient's history has been reviewed, patient examined, no change in status, stable for surgery.  I have reviewed the patient's chart and labs.  Questions were answered to the patient's satisfaction.     Dorris Carnes

## 2019-06-20 NOTE — Progress Notes (Signed)
Occupational Therapy Treatment/Discharge Note Patient Details Name: George [name] MRN: [id number] DOB: 02-07-1986 Today's Date: 06/20/2019    History of present illness Patient is a 33 y/o male who presents with aphasia, difficulty "completing normal tasks," agitation and HA. Head CT- left PCA infarct. PMH includes type 1 diabetes.   OT comments  Pt passed pill box test and is independent in ADL. No further OT needs.  Follow Up Recommendations  No OT follow up    Equipment Recommendations  None recommended by OT    Recommendations for Other Services      Precautions / Restrictions Precautions Precautions: None       Mobility Bed Mobility Overal bed mobility: Modified Independent                Transfers Overall transfer level: Independent Equipment used: None                  Balance Overall balance assessment: No apparent balance deficits (not formally assessed)                                         ADL either performed or assessed with clinical judgement   ADL Overall ADL's : Independent                                             Vision       Perception     Praxis      Cognition Arousal/Alertness: Awake/alert Behavior During Therapy: WFL for tasks assessed/performed Overall Cognitive Status: Within Functional Limits for tasks assessed                                 General Comments: performed pill box test        Exercises     Shoulder Instructions       General Comments      Pertinent Vitals/ Pain       Pain Assessment: No/denies pain  Home Living                                          Prior Functioning/Environment              Frequency  Min 3X/week        Progress Toward Goals  OT Goals(current goals can now be found in the care plan section)  Progress towards OT goals: Goals met and updated - see care plan  Acute Rehab OT  Goals Patient Stated Goal: to get better OT Goal Formulation: With patient Time For Goal Achievement: 06/28/19 Potential to Achieve Goals: Good  Plan All goals met and education completed, patient discharged from OT services;Discharge plan needs to be updated    Co-evaluation                 AM-PAC OT "6 Clicks" Daily Activity     Outcome Measure   Help from another person eating meals?: None Help from another person taking care of personal grooming?: None Help from another person toileting, which includes using toliet, bedpan, or urinal?: None Help from another person bathing (including washing,  rinsing, drying)?: None Help from another person to put on and taking off regular upper body clothing?: None Help from another person to put on and taking off regular lower body clothing?: None 6 Click Score: 24    End of Session    OT Visit Diagnosis: Unsteadiness on feet (R26.81);Other abnormalities of gait and mobility (R26.89);Muscle weakness (generalized) (M62.81);Other symptoms and signs involving cognitive function   Activity Tolerance Patient tolerated treatment well   Patient Left in chair;with call bell/phone within reach   Nurse Communication          Time: 0681-6619 OT Time Calculation (min): 18 min  Charges: OT General Charges $OT Visit: 1 Visit OT Treatments $Self Care/Home Management : 8-22 mins  Nestor Lewandowsky, OTR/L Acute Rehabilitation Services Pager: [phone] Office: [phone]   Malka So 06/20/2019, 4:12 PM

## 2019-06-21 LAB — CORTISOL, URINE, FREE
Cortisol (Ur), Free: 79 ug/24 hr — ABNORMAL HIGH (ref 5–64)
Cortisol,F,ug/L,U: 35 ug/L
Total Volume: 2250

## 2019-06-21 NOTE — Discharge Summary (Signed)
Physician Discharge Summary  [facility] YTK:[id number] DOB: 05-04-1986 DOA: 06/12/2019  PCP: Patient, No Pcp Per  Admit date: 06/12/2019 Discharge date: 06/21/2019  Admitted From: home Disposition: home  Recommendations for Outpatient Follow-up:  1. Follow up with PCP in 1-2 weeks 2. Please obtain BMP/CBC in one week  Home Health:none Equipment/Devices:none Discharge Condition: Stable CODE STATUS full code Diet recommendation: Carb modified Brief/Interim Summary:33 year old male history of type 1 diabetes who presented to the emergency department with complaintsof speech difficulty,"holding on to the wall"at home and headache. Last known normal was around 8 pm Friday night, then awoke Saturday morning with above symptoms.  He was seen by neurology who noted receptive aphasia, not oriented, difficulty comprehending commands. She was out of the window for TPA and was admitted to critical care medicine for intensive blood pressure control and stroke monitoring.  When I arrived he was initially calm, able to cooperate fully with neurological exam as below, however he shortly thereafter became very agitated. He had already received multiple doses of ativan and he was subsequently given haldol 5 followed by 10 mg before becoming calm.   I spoke with his mother who stated he complained of minor abdominal pain, but otherwiseno fevers, chills, sore throat, myalgias, nausea, vomiting, diarrhea, shortness of breath. She states that he has a chronic "smoker's cough" that is unchanged from his baseline  TRH pickup 06/18/2019  06/19/2019 patient is ambulating in the room feels well missing his cats PlayStation and hisbed anxious to go home   Discharge Diagnoses:  Active Problems:   Neurologic deficit due to acute ischemic cerebrovascular accident (CVA) ([place])   Stroke (cerebrum) (HCC)   Cerebral infarction (HCC)   Leukocytosis   Diabetes mellitus type I ([place])   AKI (acute  kidney injury) ([place])   CKD (chronic kidney disease), stage III (HCC)   Agitation   Supraventricular tachycardia ([place])   Hypertensive urgency  #1 DKA with type 1 diabetes-patient was treated with insulin drip.  He will be discharged on NPH 15 units twice a day.  He will also take NovoLog 3 times a day 5 units before meals.  He will follow-up with his PCP.   #2 large left PCA infarct-TEE cavity size was normal mildly increased left ventricular wall thickness left atrium without mass no PFO by color Doppler or injected saline aortic valve is tricuspid aortic root aortic arch and descending aorta are normal in size and structure.Transthoracic echo ejection fraction 50 to 55% could not exclude PFO but no definite left-to-right shunt patient awaiting transesophageal echo. MRI MRA of the brain shows large left PCA infarct with severe stenosis of the left posterior temporal artery branch of left PCA. CT head showed large PCA infarct on the left. Continue aspirin and Plavix. He was COVID negative.  #3 substance abuse urine drug screen positive for THC  #4 tobacco abuse counseled against the use of tobacco.  #5 sinus tachycardia with uncontrolled hypertension on carvedilol 25 mg twice a day and amlodipine 10 mg daily.  #6 hypertension uncontrolled continue above medications  #7 hyperlipidemia continue Lipitor  #8 AKI it is unknown what his baseline creatinine is however with uncontrolled diabetes he most likely had higher creatinine and possible CKD.BMP pending for today.   Estimated body mass index is 19.61 kg/m as calculated from the following:   Height as of this encounter: 5\' 3"  (1.6 m).   Weight as of this encounter: 50.2 kg.  Discharge Instructions  Discharge Instructions    Ambulatory referral to  Neurology   Complete by: As directed    Follow up with stroke clinic NP (Jessica Vanschaick or Cecille Rubin, if both not available, consider Zachery Dauer, or Ahern) at [facility]  in about 4 weeks. Thanks.   Call MD for:  difficulty breathing, headache or visual disturbances   Complete by: As directed    Call MD for:  persistant nausea and vomiting   Complete by: As directed    Call MD for:  redness, tenderness, or signs of infection (pain, swelling, redness, odor or green/yellow discharge around incision site)   Complete by: As directed    Diet - low sodium heart healthy   Complete by: As directed    Increase activity slowly   Complete by: As directed      Allergies as of 06/20/2019   No Known Allergies     Medication List    TAKE these medications   acetaminophen 325 MG tablet Commonly known as: TYLENOL Take 650 mg by mouth every 6 (six) hours as needed for headache.   amLODipine 10 MG tablet Commonly known as: NORVASC Take 1 tablet (10 mg total) by mouth daily.   aspirin 325 MG EC tablet Take 1 tablet (325 mg total) by mouth daily.   atorvastatin 40 MG tablet Commonly known as: LIPITOR Take 1 tablet (40 mg total) by mouth daily at 6 PM.   carvedilol 25 MG tablet Commonly known as: COREG Take 1 tablet (25 mg total) by mouth 2 (two) times daily with a meal. Notes to patient: Next dose: 06/20/19 pm   cloNIDine 0.1 mg/24hr patch Commonly known as: CATAPRES - Dosed in mg/24 hr Place 1 patch (0.1 mg total) onto the skin once a week. Start taking on: June 26, 2019   clopidogrel 75 MG tablet Commonly known as: PLAVIX Take 1 tablet (75 mg total) by mouth daily.   insulin NPH Human 100 UNIT/ML injection Commonly known as: NOVOLIN N Inject 0.15 mLs (15 Units total) into the skin 2 (two) times daily with a meal. What changed:   how much to take  when to take this   insulin regular 100 units/mL injection Commonly known as: NOVOLIN R Inject 0.05 mLs (5 Units total) into the skin 3 (three) times daily before meals. What changed:   how much to take  when to take this      Follow-up Information    [facility]. Schedule  an appointment as soon as possible for a visit in 4 week(s).   Specialty: Neurology Contact information: [address] [place] [phone]       Elsie Stain, MD Follow up on 06/27/2019.   Specialty: Pulmonary Disease Why: for hospital follow up appointment at 9:30 am. If you can not make this scheduled appointment please call the office to reschedule. You can also use the pharmacy at this location. Cost will range from $4.00-$10.00 Contact information: [address] [address] [phone]        [facility] Follow up.   Contact information: [address] [place] [address] [phone]         No Known Allergies  Consultations: Neurology and cardiology  Procedures/Studies: Ct Head Wo Contrast  Result Date: 06/12/2019 CLINICAL DATA:  33 y/o M; altered level of consciousness. Expressive aphasia, type 1 diabetes. EXAM: CT HEAD WITHOUT CONTRAST TECHNIQUE: Contiguous axial images were obtained from the base of the skull through the vertex without  intravenous contrast. COMPARISON:  None. FINDINGS: Brain: Hypoattenuation with loss of gray-white differentiation involving left occipital lobe and left medial temporal lobe. No hemorrhage, extra-axial collection, hydrocephalus, or herniation. Vascular: No hyperdense vessel or unexpected calcification. Skull: Normal. Negative for fracture or focal lesion. Sinuses/Orbits: No acute finding. Other: None. IMPRESSION: Large area of hypoattenuation in the left PCA distribution probably representing a late acute to early subacute infarction. No hemorrhage or gross mass effect. Differential of underlying neoplasm or cerebritis is less likely given typical vascular distribution. These results were called by telephone at the time of interpretation on 06/12/2019 at 10:08 pm to Dr. Charlesetta Shanks , who verbally acknowledged these results.  Electronically Signed   By: Kristine Garbe M.D.   On: 06/12/2019 22:13   Mr Jodene Nam Head Wo Contrast  Result Date: 06/14/2019 CLINICAL DATA:  33 y/o M; speech difficulty. Focal neuro deficit, > 6 hrs, stroke suspected Altered level of consciousness (LOC), unexplained; Stroke, follow up. History of type 1 diabetes. EXAM: MRI HEAD WITHOUT CONTRAST MRA HEAD WITHOUT CONTRAST TECHNIQUE: Multiplanar, multiecho pulse sequences of the brain and surrounding structures were obtained without intravenous contrast. Angiographic images of the head were obtained using MRA technique without contrast. COMPARISON:  None. FINDINGS: MRI HEAD FINDINGS Brain: Large region of reduced diffusion within the left PCA territory involving medial temporal lobe, occipital lobe, and thalamus compatible with late acute/early subacute infarction. The distribution of the infarct is stable in comparison with prior CT of head given differences in technique. The infarct is T2 hyperintense and demonstrates mild cortical swelling. There is petechial hemorrhage without associated mass effect within the left occipital lobe, [place]idelberg calssification 1a: HI1, scattered small petechiae, no mass effect. Vascular: Normal flow voids. Skull and upper cervical spine: Normal marrow signal. Sinuses/Orbits: Negative. Other: None. MRA HEAD FINDINGS Internal carotid arteries:  Patent. Anterior cerebral arteries:  Patent. Middle cerebral arteries: Patent. Anterior communicating artery: Patent. Posterior communicating arteries:  Patent. Posterior cerebral arteries: Patent right. Patent proximal left. Severe stenosis of a large left posterior temporal artery branch origin (series 1013, image 3). Basilar artery:  Patent. Vertebral arteries:  Patent. IMPRESSION: MRI head: Large left PCA distribution late acute/early subacute infarction. Petechial hemorrhage in left occipital lobe, [place]idelberg calssification 1a: HI1, scattered small petechiae, no mass effect. MRA  head: 1. Severe stenosis of a large left posterior temporal artery branch of the left PCA. 2. No proximal large vessel occlusion, aneurysm, or high-grade stenosis. Electronically Signed   By: Kristine Garbe M.D.   On: 06/14/2019 19:39   Mr Brain Wo Contrast  Result Date: 06/14/2019 CLINICAL DATA:  33 y/o M; speech difficulty. Focal neuro deficit, > 6 hrs, stroke suspected Altered level of consciousness (LOC), unexplained; Stroke, follow up. History of type 1 diabetes. EXAM: MRI HEAD WITHOUT CONTRAST MRA HEAD WITHOUT CONTRAST TECHNIQUE: Multiplanar, multiecho pulse sequences of the brain and surrounding structures were obtained without intravenous contrast. Angiographic images of the head were obtained using MRA technique without contrast. COMPARISON:  None. FINDINGS: MRI HEAD FINDINGS Brain: Large region of reduced diffusion within the left PCA territory involving medial temporal lobe, occipital lobe, and thalamus compatible with late acute/early subacute infarction. The distribution of the infarct is stable in comparison with prior CT of head given differences in technique. The infarct is T2 hyperintense and demonstrates mild cortical swelling. There is petechial hemorrhage without associated mass effect within the left occipital lobe, [place]idelberg calssification 1a: HI1, scattered small petechiae, no mass effect. Vascular: Normal flow voids. Skull and upper cervical  spine: Normal marrow signal. Sinuses/Orbits: Negative. Other: None. MRA HEAD FINDINGS Internal carotid arteries:  Patent. Anterior cerebral arteries:  Patent. Middle cerebral arteries: Patent. Anterior communicating artery: Patent. Posterior communicating arteries:  Patent. Posterior cerebral arteries: Patent right. Patent proximal left. Severe stenosis of a large left posterior temporal artery branch origin (series 1013, image 3). Basilar artery:  Patent. Vertebral arteries:  Patent. IMPRESSION: MRI head: Large left PCA distribution late  acute/early subacute infarction. Petechial hemorrhage in left occipital lobe, Heidelberg calssification 1a: HI1, scattered small petechiae, no mass effect. MRA head: 1. Severe stenosis of a large left posterior temporal artery branch of the left PCA. 2. No proximal large vessel occlusion, aneurysm, or high-grade stenosis. Electronically Signed   By: Kristine Garbe M.D.   On: 06/14/2019 19:39   Dg Chest Port 1 View  Result Date: 06/15/2019 CLINICAL DATA:  Respiratory failure EXAM: PORTABLE CHEST 1 VIEW COMPARISON:  June 13, 2019 FINDINGS: The heart size and mediastinal contours are within normal limits. Both lungs are clear. The visualized skeletal structures are unremarkable. IMPRESSION: No active disease. Electronically Signed   By: Dorise Bullion III M.D   On: 06/15/2019 08:33   Dg Chest Port 1 View  Result Date: 06/13/2019 CLINICAL DATA:  Leukocytosis. EXAM: PORTABLE CHEST 1 VIEW COMPARISON:  None. FINDINGS: The heart size and mediastinal contours are within normal limits. Both lungs are clear. The visualized skeletal structures are unremarkable. IMPRESSION: No active disease. Electronically Signed   By: Earle Gell M.D.   On: 06/13/2019 09:10   Vas US Carotid  Result Date: 06/14/2019 Carotid Arterial Duplex Study Indications:       CVA, Speech disturbance and Gait disturbance. Risk Factors:      Diabetes. Limitations:       Patient would not keep head turned. Comparison Study:  No prior study on file for comparison. Performing Technologist: Sharion Dove RVS  Examination Guidelines: A complete evaluation includes B-mode imaging, spectral Doppler, color Doppler, and power Doppler as needed of all accessible portions of each vessel. Bilateral testing is considered an integral part of a complete examination. Limited examinations for reoccurring indications may be performed as noted.  Right Carotid Findings: +----------+--------+--------+--------+--------+--------+           PSV cm/sEDV  cm/sStenosisDescribeComments +----------+--------+--------+--------+--------+--------+ CCA Prox  156     12                               +----------+--------+--------+--------+--------+--------+ CCA Distal111     17                               +----------+--------+--------+--------+--------+--------+ ICA Prox  92      12                               +----------+--------+--------+--------+--------+--------+ ICA Mid                                   tortuous +----------+--------+--------+--------+--------+--------+ ICA Distal93      20                      tortuous +----------+--------+--------+--------+--------+--------+ ECA       74      9  tortuous +----------+--------+--------+--------+--------+--------+ +----------+--------+-------+--------+-------------------+           PSV cm/sEDV cmsDescribeArm Pressure (mmHG) +----------+--------+-------+--------+-------------------+ [id number]                                        +----------+--------+-------+--------+-------------------+ +---------+--------+--+--------+-+ VertebralPSV cm/s45EDV cm/s8 +---------+--------+--+--------+-+  Left Carotid Findings: +----------+--------+--------+--------+--------+--------+           PSV cm/sEDV cm/sStenosisDescribeComments +----------+--------+--------+--------+--------+--------+ CCA Prox  171     27                               +----------+--------+--------+--------+--------+--------+ CCA Distal138     27                               +----------+--------+--------+--------+--------+--------+ +----------+--------+--------+--------+-------------------+ SubclavianPSV cm/sEDV cm/sDescribeArm Pressure (mmHG) +----------+--------+--------+--------+-------------------+           183                                         +----------+--------+--------+--------+-------------------+  +---------+--------+--+--------+--+ VertebralPSV cm/s73EDV cm/s12 +---------+--------+--+--------+--+  Summary: Right Carotid: The extracranial vessels were near-normal with only minimal wall                thickening or plaque. Highly tortuous ICA. Left Carotid: Unable to scan left ICA and ECA secondary to patient compliance. Vertebrals:  Bilateral vertebral arteries demonstrate antegrade flow. Subclavians: Normal flow hemodynamics were seen in bilateral subclavian              arteries. *See table(s) above for measurements and observations.  Electronically signed by Antony Contras MD on 06/14/2019 at 7:59:14 AM.    Final    Vas [place] Lower Extremity Venous (dvt)  Result Date: 06/13/2019  Lower Venous Study Indications: Stroke. Other Indications: Patient in 4 point restraints with feet tied together. Comparison Study: No prior study on file for comparison. Performing Technologist: Sharion Dove RVS  Examination Guidelines: A complete evaluation includes B-mode imaging, spectral Doppler, color Doppler, and power Doppler as needed of all accessible portions of each vessel. Bilateral testing is considered an integral part of a complete examination. Limited examinations for reoccurring indications may be performed as noted.  +---------+---------------+---------+-----------+----------+-------+ RIGHT    CompressibilityPhasicitySpontaneityPropertiesSummary +---------+---------------+---------+-----------+----------+-------+ CFV      Full           Yes      Yes                          +---------+---------------+---------+-----------+----------+-------+ SFJ      Full                                                 +---------+---------------+---------+-----------+----------+-------+ FV Prox  Full                                                 +---------+---------------+---------+-----------+----------+-------+ FV Mid   Full                                                  +---------+---------------+---------+-----------+----------+-------+  FV DistalFull                                                 +---------+---------------+---------+-----------+----------+-------+ PFV      Full                                                 +---------+---------------+---------+-----------+----------+-------+ POP      Full           Yes      Yes                          +---------+---------------+---------+-----------+----------+-------+ PTV      Full                                                 +---------+---------------+---------+-----------+----------+-------+ PERO     Full                                                 +---------+---------------+---------+-----------+----------+-------+   +---------+---------------+---------+-----------+----------+-------+ LEFT     CompressibilityPhasicitySpontaneityPropertiesSummary +---------+---------------+---------+-----------+----------+-------+ CFV      Full           Yes      Yes                          +---------+---------------+---------+-----------+----------+-------+ SFJ      Full                                                 +---------+---------------+---------+-----------+----------+-------+ FV Prox  Full                                                 +---------+---------------+---------+-----------+----------+-------+ FV Mid   Full                                                 +---------+---------------+---------+-----------+----------+-------+ FV DistalFull                                                 +---------+---------------+---------+-----------+----------+-------+ PFV      Full                                                 +---------+---------------+---------+-----------+----------+-------+ POP  Full           Yes      Yes                          +---------+---------------+---------+-----------+----------+-------+ PTV      Full                                                  +---------+---------------+---------+-----------+----------+-------+ PERO     Full                                                 +---------+---------------+---------+-----------+----------+-------+     Summary: Right: There is no evidence of deep vein thrombosis in the lower extremity. Left: There is no evidence of deep vein thrombosis in the lower extremity.  *See table(s) above for measurements and observations. Electronically signed by Ruta Hinds MD on 06/13/2019 at 5:15:12 PM.    Final     (Echo, Carotid, EGD, Colonoscopy, ERCP)    Subjective:  Anxious to go home Discharge Exam: Vitals:   06/20/19 1319 06/20/19 1659  BP: (!) 138/97 (!) 141/102  Pulse:  96  Resp:    Temp:  98.3 F (36.8 C)  SpO2: 100% 100%   Vitals:   06/20/19 1235 06/20/19 1245 06/20/19 1319 06/20/19 1659  BP: 119/82 (!) 142/89 (!) 138/97 (!) 141/102  Pulse: 67 64  96  Resp: 16 12    Temp:    98.3 F (36.8 C)  TempSrc:    Oral  SpO2: 99% 100% 100% 100%  Weight:      Height:        General: Pt is alert, awake, not in acute distress Cardiovascular: RRR, S1/S2 +, no rubs, no gallops Respiratory: CTA bilaterally, no wheezing, no rhonchi Abdominal: Soft, NT, ND, bowel sounds + Extremities: no edema, no cyanosis    The results of significant diagnostics from this hospitalization (including imaging, microbiology, ancillary and laboratory) are listed below for reference.     Microbiology: Recent Results (from the past 240 hour(s))  Novel Coronavirus,NAA,(SEND-OUT TO REF LAB - TAT 24-48 hrs); Hosp Order     Status: None   Collection Time: 06/13/19  1:25 AM   Specimen: Nasopharyngeal Swab; Respiratory  Result Value Ref Range Status   SARS-CoV-2, NAA NOT DETECTED NOT DETECTED Final    Comment: (NOTE) This test was developed and its performance characteristics determined by [employer]. This test has not been FDA cleared or  approved. This test has been authorized by FDA under an Emergency Use Authorization (EUA). This test is only authorized for the duration of time the declaration that circumstances exist justifying the authorization of the emergency use of in vitro diagnostic tests for detection of SARS-CoV-2 virus and/or diagnosis of COVID-19 infection under section 564(b)(1) of the Act, 21 U.S.C. 829HBZ-1(I)(9), unless the authorization is terminated or revoked sooner. When diagnostic testing is negative, the possibility of a false negative result should be considered in the context of a patient's recent exposures and the presence of clinical signs and symptoms consistent with COVID-19. An individual without symptoms of COVID-19 and who is not shedding SARS-CoV-2 virus would expect to have a negative (not detected) result in  this assay. Performed  At: [facility] [address] [place], [place] [id number] Rush Farmer MD XT:[id number]    [place]  Final    Comment: Performed at [facility], [place] [address] [place], [place] [address]  MRSA PCR Screening     Status: None   Collection Time: 06/13/19  3:46 AM   Specimen: Nasopharyngeal  Result Value Ref Range Status   MRSA by PCR NEGATIVE NEGATIVE Final    Comment:        The GeneXpert MRSA Assay (FDA approved for NASAL specimens only), is one component of a comprehensive MRSA colonization surveillance program. It is not intended to diagnose MRSA infection nor to guide or monitor treatment for MRSA infections. Performed at [facility], [place] [address], [place], [place] [address]   Culture, blood (Routine X 2) w Reflex to ID Panel     Status: None   Collection Time: 06/13/19 10:00 AM   Specimen: Left Antecubital; Blood  Result Value Ref Range Status   Specimen Description LEFT ANTECUBITAL  Final   Special Requests   Final    BOTTLES DRAWN AEROBIC ONLY Blood Culture adequate volume   Culture    Final    NO GROWTH 5 DAYS Performed at [facility], [place] [address] [place], [place] [address]    Report Status 06/18/2019 FINAL  Final  Culture, blood (Routine X 2) w Reflex to ID Panel     Status: None   Collection Time: 06/13/19 10:05 AM   Specimen: Left Antecubital; Blood  Result Value Ref Range Status   Specimen Description LEFT ANTECUBITAL  Final   Special Requests   Final    BOTTLES DRAWN AEROBIC ONLY Blood Culture adequate volume   Culture   Final    NO GROWTH 5 DAYS Performed at [facility], [place] [address], [place],  [address]    Report Status 06/18/2019 FINAL  Final     Labs: BNP (last 3 results) No results for input(s): BNP in the last 8760 hours. Basic Metabolic Panel: Recent Labs  Lab 06/15/19 0158 06/16/19 0521 06/17/19 0532 06/17/19 0623 06/18/19 0103 06/18/19 1321 06/19/19 1011 06/20/19 0515  NA 143 136 138  --  131* 139 138 140  K 3.7 3.9 4.6  --  4.3 3.6 3.7 3.5  CL 116* 109 107  --  101 109 109 111  CO2 19* 18* 21*  --  19* 22 22 22   GLUCOSE 66* 298* 400* 399* 475* 126* 125* 121*  BUN 17 20 23*  --  32* 27* 25* 27*  CREATININE 2.26* 2.59* 2.46*  --  2.36* 2.27* 2.24* 1.92*  CALCIUM 8.6* 8.5* 8.5*  --  8.2* 8.3* 8.2* 8.2*  MG 2.0 1.7  --   --   --   --  1.8  --   PHOS 4.4  --   --   --   --   --   --   --    Liver Function Tests: No results for input(s): AST, ALT, ALKPHOS, BILITOT, PROT, ALBUMIN in the last 168 hours. No results for input(s): LIPASE, AMYLASE in the last 168 hours. No results for input(s): AMMONIA in the last 168 hours. CBC: Recent Labs  Lab 06/15/19 0158 06/16/19 0521 06/18/19 0103  WBC 17.1* 13.9* 14.1*  HGB 14.1 13.2 12.5*  HCT 40.8 38.9* 35.9*  MCV 88.5 89.6 88.0  PLT 257 272 239   Cardiac Enzymes: Recent Labs  Lab 06/14/19 1446 06/14/19 1925 06/15/19 0158  TROPONINI 0.24* 0.28* 0.34*   BNP: Invalid input(s): POCBNP CBG: Recent Labs  Lab 06/20/19 1217 06/20/19 1237 06/20/19 1306  06/20/19 1325 06/20/19 1338  GLUCAP 22* 93 55* 42* 78   D-Dimer No results for input(s): DDIMER in the last 72 hours. Hgb A1c No results for input(s): HGBA1C in the last 72 hours. Lipid Profile No results for input(s): CHOL, HDL, LDLCALC, TRIG, CHOLHDL, LDLDIRECT in the last 72 hours. Thyroid function studies No results for input(s): TSH, T4TOTAL, T3FREE, THYROIDAB in the last 72 hours.  Invalid input(s): FREET3 Anemia work up No results for input(s): VITAMINB12, FOLATE, FERRITIN, TIBC, IRON, RETICCTPCT in the last 72 hours. Urinalysis    Component Value Date/Time   COLORURINE YELLOW 06/13/2019 0423   APPEARANCEUR HAZY (A) 06/13/2019 0423   LABSPEC 1.008 06/13/2019 0423   PHURINE 6.0 06/13/2019 0423   GLUCOSEU 150 (A) 06/13/2019 0423   HGBUR SMALL (A) 06/13/2019 0423   BILIRUBINUR NEGATIVE 06/13/2019 0423   KETONESUR NEGATIVE 06/13/2019 0423   PROTEINUR >=300 (A) 06/13/2019 0423   NITRITE NEGATIVE 06/13/2019 0423   LEUKOCYTESUR NEGATIVE 06/13/2019 0423   Sepsis Labs Invalid input(s): PROCALCITONIN,  WBC,  LACTICIDVEN Microbiology Recent Results (from the past 240 hour(s))  Novel Coronavirus,NAA,(SEND-OUT TO REF LAB - TAT 24-48 hrs); Hosp Order     Status: None   Collection Time: 06/13/19  1:25 AM   Specimen: Nasopharyngeal Swab; Respiratory  Result Value Ref Range Status   SARS-CoV-2, NAA NOT DETECTED NOT DETECTED Final    Comment: (NOTE) This test was developed and its performance characteristics determined by [employer]. This test has not been FDA cleared or approved. This test has been authorized by FDA under an Emergency Use Authorization (EUA). This test is only authorized for the duration of time the declaration that circumstances exist justifying the authorization of the emergency use of in vitro diagnostic tests for detection of SARS-CoV-2 virus and/or diagnosis of COVID-19 infection under section 564(b)(1) of the Act, 21 U.S.C. 952WUX-3(K)(4),  unless the authorization is terminated or revoked sooner. When diagnostic testing is negative, the possibility of a false negative result should be considered in the context of a patient's recent exposures and the presence of clinical signs and symptoms consistent with COVID-19. An individual without symptoms of COVID-19 and who is not shedding SARS-CoV-2 virus would expect to have a negative (not detected) result in this assay. Performed  At: [facility] [address] [place], [place] [id number] Rush Farmer MD GU:[id number]    [place]  Final    Comment: Performed at [facility], [place] [address], [place], [place] [address]  MRSA PCR Screening     Status: None   Collection Time: 06/13/19  3:46 AM   Specimen: Nasopharyngeal  Result Value Ref Range Status   MRSA by PCR NEGATIVE NEGATIVE Final    Comment:        The GeneXpert MRSA Assay (FDA approved for NASAL specimens only), is one component of a comprehensive MRSA colonization surveillance program. It is not intended to diagnose MRSA infection nor to guide or monitor treatment for MRSA infections. Performed at [facility], [place] [address], [place], [place] [address]   Culture, blood (Routine X 2) w Reflex to ID Panel     Status: None   Collection Time: 06/13/19 10:00 AM   Specimen: Left Antecubital; Blood  Result Value Ref Range Status   Specimen Description LEFT ANTECUBITAL  Final  Special Requests   Final    BOTTLES DRAWN AEROBIC ONLY Blood Culture adequate volume   Culture   Final    NO GROWTH 5 DAYS Performed at [facility], [place] [address] [place], [place] [address]    Report Status 06/18/2019 FINAL  Final  Culture, blood (Routine X 2) w Reflex to ID Panel     Status: None   Collection Time: 06/13/19 10:05 AM   Specimen: Left Antecubital; Blood  Result Value Ref Range Status   Specimen Description LEFT ANTECUBITAL  Final   Special Requests   Final     BOTTLES DRAWN AEROBIC ONLY Blood Culture adequate volume   Culture   Final    NO GROWTH 5 DAYS Performed at [facility], [place] [address], [place], [place] [address]    Report Status 06/18/2019 FINAL  Final     Time coordinating discharge: 34 minutes  SIGNED:   Georgette Shell, MD  [facility] 06/21/2019, 1:19 PM Pager   If 7PM-7AM, please contact night-coverage [email or web address] Password [email or web address]

## 2019-06-22 LAB — METANEPHRINES, URINE, 24 HOUR
Metaneph Total, Ur: 84 ug/L
Metanephrines, 24H Ur: 189 ug/24 hr (ref 58–276)
Normetanephrine, 24H Ur: 691 ug/24 hr (ref 156–729)
Normetanephrine, Ur: 307 ug/L
Total Volume: 2250

## 2019-06-23 ENCOUNTER — Encounter (HOSPITAL_COMMUNITY): Payer: Self-pay | Admitting: Internal Medicine

## 2019-06-23 LAB — 5 HIAA, QUANTITATIVE, URINE, 24 HOUR
5-HIAA, Ur: 1.4 mg/L
5-HIAA,Quant.,24 Hr Urine: 3.2 mg/24 hr (ref 0.0–14.9)
Total Volume: 2250

## 2019-06-24 LAB — CATECHOLAMINES,UR.,FREE,24 HR
Dopamine, Rand Ur: 55 ug/L
Dopamine, Ur, 24Hr: 124 ug/24 hr (ref 0–510)
Epinephrine, Rand Ur: 3 ug/L
Epinephrine, U, 24Hr: 7 ug/24 hr (ref 0–20)
Norepinephrine, Rand Ur: 16 ug/L
Norepinephrine,U,24H: 36 ug/24 hr (ref 0–135)
Total Volume: 2250

## 2019-06-26 NOTE — Progress Notes (Signed)
Subjective:    Patient ID: George [name], male    DOB: 10/20/1986, 33 y.o.   MRN: [id number]  This is a 33 year old male with history of type 1 diabetes who had presented to the emergency room on 14 June with difficulty with speech headache and increased fall risk.  The patient also was noted to have receptive aphasia and disorientation and difficulty comprehending commands.  The patient was not given TPA as the patient was outside of the treatment window but was admitted to the intensive care unit for blood pressure control and stroke monitoring.  Subsequently the patient was transferred to regular room and on 21 June was ambulating well and wishing to be discharged.  Patient was thus discharged home on 23 June for post hospital follow-up which occurs today in this clinic.  Note initially the patient had significant supraventricular tachycardia and hypertensive urgency and blood sugars that were out of control.  Hemoglobin A1c was 8.4.  Since discharge the blood sugars have shown improvement with fasting sugars in the 70 range and then postprandial in 180 range.  Blood pressure is still elevated however the patient did not receive the amlodipine that was prescribed.  The patient states he still has difficulty with word retrieval and also has decreased vision in the left eye.  The stroke involved the posterior cerebral artery circulation of the left involving the occipital and temporal lobes.  No recommendation for post hospital rehab was made however it appears the patient would benefit from physical therapy and speech-language therapy.  The patient currently does not have a job at this time and he is living at home with his parents.  The patient does not have insurance at this time.  Below is the discharge summary Admit date: 06/12/2019 Discharge date: 06/21/2019  Admitted From: home Disposition: home  Recommendations for Outpatient Follow-up:  1. Follow up with PCP in 1-2 weeks 2. Please  obtain BMP/CBC in one week  Home Health:none Equipment/Devices:none Discharge Condition: Stable CODE STATUS full code Diet recommendation: Carb modified Brief/Interim Summary:33 year old male history of type 1 diabetes who presented to the emergency department with complaintsof speech difficulty,"holding on to the wall"at home and headache. Last known normal was around 8 pm Friday night, then awoke Saturday morning with above symptoms.  He was seen by neurology who noted receptive aphasia, not oriented, difficulty comprehending commands. She was out of the window for TPA and was admitted to critical care medicine for intensive blood pressure control and stroke monitoring.  When I arrived he was initially calm, able to cooperate fully with neurological exam as below, however he shortly thereafter became very agitated. He had already received multiple doses of ativan and he was subsequently given haldol 5 followed by 10 mg before becoming calm.   I spoke with his mother who stated he complained of minor abdominal pain, but otherwiseno fevers, chills, sore throat, myalgias, nausea, vomiting, diarrhea, shortness of breath. She states that he has a chronic "smoker's cough" that is unchanged from his baseline  TRH pickup 06/18/2019  06/19/2019 patient is ambulating in the room feels well missing his cats PlayStation and hisbed anxious to go home   Discharge Diagnoses:  Active Problems:   Neurologic deficit due to acute ischemic cerebrovascular accident (CVA) ([place])   Stroke (cerebrum) (HCC)   Cerebral infarction (HCC)   Leukocytosis   Diabetes mellitus type I ([place])   AKI (acute kidney injury) ([place])   CKD (chronic kidney disease), stage III (HCC)   Agitation  Supraventricular tachycardia ([place])   Hypertensive urgency  #1 DKA with type 1 diabetes-patient was treated with insulin drip.  He will be discharged on NPH 15 units twice a day.  He will also take NovoLog 3 times a  day 5 units before meals.  He will follow-up with his PCP.   #2 large left PCA infarct-TEE cavity size was normal mildly increased left ventricular wall thickness left atrium without mass no PFO by color Doppler or injected saline aortic valve is tricuspid aortic root aortic arch and descending aorta are normal in size and structure.Transthoracic echo ejection fraction 50 to 55% could not exclude PFO but no definite left-to-right shunt patient awaiting transesophageal echo. MRI MRA of the brain shows large left PCA infarct with severe stenosis of the left posterior temporal artery branch of left PCA. CT head showed large PCA infarct on the left. Continue aspirin and Plavix. He was COVID negative.  #3 substance abuse urine drug screen positive for THC  #4 tobacco abuse counseled against the use of tobacco.  #5 sinus tachycardia with uncontrolled hypertension on carvedilol 25 mg twice a day and amlodipine 10 mg daily.  #6 hypertension uncontrolled continue above medications  #7 hyperlipidemia continue Lipitor  #8 AKI it is unknown what his baseline creatinine is however with uncontrolled diabetes he most likely had higher creatinine and possible CKD.BMP pending for today.  Note the patient has had a history of type 1 diabetes since the age of 33 he has not had frequent admissions for DKA however he was in overt DKA at this admission.  The patient states he is taking fluids in well and his urination has improved.  He does complain of lower extremity edema and tightness in the legs.  Note today's blood sugar is at 158  Patient also continues to smoke 5 to 10 cigarettes daily     Past Medical History:  Diagnosis Date   Supraventricular tachycardia ([place])    Type 1 diabetes ([place])      History reviewed. No pertinent family history.   Social History   Socioeconomic History   Marital status: Single    Spouse name: Not on file   Number of children: Not on file   Years  of education: Not on file   Highest education level: Not on file  Occupational History   Not on file  Social Needs   Financial resource strain: Not on file   Food insecurity    Worry: Not on file    Inability: Not on file   Transportation needs    Medical: Not on file    Non-medical: Not on file  Tobacco Use   Smoking status: Current Every Day Smoker    Packs/day: 0.50    Types: Cigarettes    Start date: 12/29/2010   Smokeless tobacco: Never Used  Substance and Sexual Activity   Alcohol use: Not Currently    Comment: 6 beers/week   Drug use: Not Currently   Sexual activity: Not on file  Lifestyle   Physical activity    Days per week: Not on file    Minutes per session: Not on file   Stress: Not on file  Relationships   Social connections    Talks on phone: Not on file    Gets together: Not on file    Attends religious service: Not on file    Active member of club or organization: Not on file    Attends meetings of clubs or organizations: Not on file  Relationship status: Not on file   Intimate partner violence    Fear of current or ex partner: Not on file    Emotionally abused: Not on file    Physically abused: Not on file    Forced sexual activity: Not on file  Other Topics Concern   Not on file  Social History Narrative   Not on file     No Known Allergies   Outpatient Medications Prior to Visit  Medication Sig Dispense Refill   acetaminophen (TYLENOL) 325 MG tablet Take 650 mg by mouth every 6 (six) hours as needed for headache.     aspirin EC 325 MG EC tablet Take 1 tablet (325 mg total) by mouth daily. 30 tablet 0   atorvastatin (LIPITOR) 40 MG tablet Take 1 tablet (40 mg total) by mouth daily at 6 PM. 30 tablet 1   carvedilol (COREG) 25 MG tablet Take 1 tablet (25 mg total) by mouth 2 (two) times daily with a meal. 60 tablet 1   cloNIDine (CATAPRES - DOSED IN MG/24 HR) 0.1 mg/24hr patch Place 1 patch (0.1 mg total) onto the skin once a  week. 4 patch 12   clopidogrel (PLAVIX) 75 MG tablet Take 1 tablet (75 mg total) by mouth daily. 30 tablet 2   insulin NPH Human (NOVOLIN N) 100 UNIT/ML injection Inject 0.15 mLs (15 Units total) into the skin 2 (two) times daily with a meal. 10 mL 11   insulin regular (NOVOLIN R) 100 units/mL injection Inject 0.05 mLs (5 Units total) into the skin 3 (three) times daily before meals. 10 mL 6   amLODipine (NORVASC) 10 MG tablet Take 1 tablet (10 mg total) by mouth daily. (Patient not taking: Reported on 06/27/2019) 30 tablet 1   No facility-administered medications prior to visit.      Review of Systems  Constitutional: Positive for activity change and fatigue. Negative for appetite change and fever.  HENT: Negative.   Eyes: Positive for visual disturbance.  Respiratory: Negative.  Negative for shortness of breath.   Cardiovascular: Positive for leg swelling. Negative for chest pain and palpitations.  Endocrine: Positive for polyuria.  Genitourinary: Negative.   Musculoskeletal: Negative.   Skin: Negative.   Neurological: Positive for weakness. Negative for dizziness, tremors, seizures, syncope, speech difficulty, light-headedness, numbness and headaches.       Legs tight Word retrieval  Psychiatric/Behavioral: Positive for agitation, behavioral problems, confusion and decreased concentration. Negative for dysphoric mood, hallucinations, self-injury, sleep disturbance and suicidal ideas. The patient is not nervous/anxious and is not hyperactive.        Objective:   Physical Exam Vitals:   06/27/19 0934  BP: (!) 168/96  Pulse: 82  Resp: 16  Temp: 98.5 F (36.9 C)  TempSrc: Oral  SpO2: 100%  Weight: 139 lb 3.2 oz (63.1 kg)    Gen: Pleasant, well-nourished, in no distress,  normal affect  ENT: No lesions,  mouth clear,  oropharynx clear, no postnasal drip  Neck: No JVD, no TMG, no carotid bruits  Lungs: No use of accessory muscles, no dullness to percussion, clear  without rales or rhonchi  Cardiovascular: RRR, heart sounds normal, no murmur or gallops, 2+ peripheral edema  Abdomen: soft and NT, no HSM,  BS normal  Musculoskeletal: No deformities, no cyanosis or clubbing  Neuro: alert, non focal,  Difficulty with word retrieval  Skin: Warm, no lesions or rashes FOOT Exam NORMAL  BMP Latest Ref Rng & Units 06/20/2019 06/19/2019 06/18/2019  Glucose 70 -  99 mg/dL 121(H) 125(H) 126(H)  BUN 6 - 20 mg/dL 27(H) 25(H) 27(H)  Creatinine 0.61 - 1.24 mg/dL 1.92(H) 2.24(H) 2.27(H)  Sodium 135 - 145 mmol/L 140 138 139  Potassium 3.5 - 5.1 mmol/L 3.5 3.7 3.6  Chloride 98 - 111 mmol/L 111 109 109  CO2 22 - 32 mmol/L 22 22 22   Calcium 8.9 - 10.3 mg/dL 8.2(L) 8.2(L) 8.3(L)   CBC Latest Ref Rng & Units 06/18/2019 06/16/2019 06/15/2019  WBC 4.0 - 10.5 K/uL 14.1(H) 13.9(H) 17.1(H)  Hemoglobin 13.0 - 17.0 g/dL 12.5(L) 13.2 14.1  Hematocrit 39.0 - 52.0 % 35.9(L) 38.9(L) 40.8  Platelets 150 - 400 K/uL 239 272 257      Assessment & Plan:  I personally reviewed all images and lab data in the [facility] system as well as any outside material available during this office visit and agree with the  radiology impressions.   Hypertensive urgency Hypertensive urgency with improved blood pressure control today however the patient is not taking amlodipine and also has significant lower extremity edema  Plan will be to continue clonidine at 0.1 mg patch daily continue Coreg at 25 mg twice daily and resume amlodipine at 10 mg daily.  Also will begin hydrochlorothiazide at 25 mg daily for fluid control and blood pressure control  Will obtain a complete metabolic panel at this visit along with a thyroid profile and CBC  Neurologic deficit due to acute ischemic cerebrovascular accident (CVA) [facility]) The patient had a posterior circulation stroke and has still difficulty with word retrieval and balance and would benefit from an outpatient physical therapy and speech-language therapy  consultation also would benefit from follow-up neurology  We will continue for now with the aspirin 325 mg daily, Lipitor 40 mg daily, and Plavix 75 mg daily  Will obtain a lipid panel  Stroke (cerebrum) ([place]) Posterior circulation stroke involving the temporal lobe and occipital lobe on the left  Supraventricular tachycardia ([place]) Patient's heart rate now is normal on beta-blocker  Diabetes mellitus type I ([place]) Diabetes type 1  Will continue NPH 15 units twice daily and sliding scale regular insulin at 4 units 3 times daily adding an additional unit for blood sugars greater than 180  Cerebral infarction ([place]) CT scan and MRIs reviewed revealing the posterior  cerebral artery stroke distribution  CKD (chronic kidney disease), stage III ([place]) Chronic kidney disease with acute kidney injury  We will follow-up renal function today  Leukocytosis Leukocytosis  We will follow-up CBC today  Tobacco use disorder I spent 10 minutes of this visit on tobacco cessation and the importance of this  Hyperlipidemia due to type 1 diabetes mellitus ([place]) Will maintain atorvastatin and recheck lipid panel   George [name] was seen today for hospitalization follow-up.  Diagnoses and all orders for this visit:  Cerebrovascular accident (CVA) due to occlusion of left posterior cerebral artery ([place]) -     Comprehensive metabolic panel -     Ambulatory referral to Physical Therapy -     Ambulatory referral to Speech Therapy -     Ambulatory referral to Neurology  Type 1 diabetes mellitus with ketoacidosis without coma (HCC) -     Glucose (CBG) -     Comprehensive metabolic panel -     Lipid panel -     CBC with Differential/Platelet; Future  Cerebral infarction due to thrombosis of left posterior cerebral artery (HCC) -     Comprehensive metabolic panel -     CBC with Differential/Platelet; Future -  Ambulatory referral to Physical Therapy -     Ambulatory referral to Speech Therapy -      Ambulatory referral to Neurology  AKI (acute kidney injury) ([place]) -     Comprehensive metabolic panel  CKD (chronic kidney disease), stage III ([place]) -     Comprehensive metabolic panel  Supraventricular tachycardia ([place])  Hypertensive urgency -     Thyroid Profile -     CBC with Differential/Platelet; Future  Tobacco use disorder  Neurologic deficit due to acute ischemic cerebrovascular accident (CVA) (HCC)  Leukocytosis, unspecified type  Hyperlipidemia due to type 1 diabetes mellitus ([place])  Other orders -     insulin regular (NOVOLIN R) 100 units/mL injection; Inject 0.04 mLs (4 Units total) into the skin 3 (three) times daily before meals. May increase by 1 unit at each dose for glucose greater than 180 -     Discontinue: amLODipine (NORVASC) 10 MG tablet; Take 1 tablet (10 mg total) by mouth daily. -     cloNIDine (CATAPRES - DOSED IN MG/24 HR) 0.1 mg/24hr patch; Place 1 patch (0.1 mg total) onto the skin once a week. -     aspirin 325 MG EC tablet; Take 1 tablet (325 mg total) by mouth daily. -     atorvastatin (LIPITOR) 40 MG tablet; Take 1 tablet (40 mg total) by mouth daily at 6 PM. -     carvedilol (COREG) 25 MG tablet; Take 1 tablet (25 mg total) by mouth 2 (two) times daily with a meal. -     clopidogrel (PLAVIX) 75 MG tablet; Take 1 tablet (75 mg total) by mouth daily. -     insulin NPH Human (NOVOLIN N) 100 UNIT/ML injection; Inject 0.15 mLs (15 Units total) into the skin 2 (two) times daily with a meal. -     hydrochlorothiazide (HYDRODIURIL) 25 MG tablet; Take 1 tablet (25 mg total) by mouth daily. -     nicotine polacrilex (NICORETTE MINI) 4 MG lozenge; Take three times daily to stop smoking -     amLODipine (NORVASC) 10 MG tablet; Take 1 tablet (10 mg total) by mouth daily.

## 2019-06-27 ENCOUNTER — Ambulatory Visit: Payer: Self-pay | Attending: Critical Care Medicine | Admitting: Critical Care Medicine

## 2019-06-27 ENCOUNTER — Other Ambulatory Visit: Payer: Self-pay

## 2019-06-27 ENCOUNTER — Encounter: Payer: Self-pay | Admitting: Critical Care Medicine

## 2019-06-27 VITALS — BP 168/96 | HR 82 | Temp 98.5°F | Resp 16 | Wt 139.2 lb

## 2019-06-27 DIAGNOSIS — E1010 Type 1 diabetes mellitus with ketoacidosis without coma: Secondary | ICD-10-CM

## 2019-06-27 DIAGNOSIS — I63532 Cerebral infarction due to unspecified occlusion or stenosis of left posterior cerebral artery: Secondary | ICD-10-CM | POA: Diagnosis not present

## 2019-06-27 DIAGNOSIS — I63332 Cerebral infarction due to thrombosis of left posterior cerebral artery: Secondary | ICD-10-CM

## 2019-06-27 DIAGNOSIS — Z87891 Personal history of nicotine dependence: Secondary | ICD-10-CM | POA: Insufficient documentation

## 2019-06-27 DIAGNOSIS — N183 Chronic kidney disease, stage 3 unspecified: Secondary | ICD-10-CM

## 2019-06-27 DIAGNOSIS — I160 Hypertensive urgency: Secondary | ICD-10-CM | POA: Diagnosis not present

## 2019-06-27 DIAGNOSIS — I471 Supraventricular tachycardia: Secondary | ICD-10-CM

## 2019-06-27 DIAGNOSIS — R29818 Other symptoms and signs involving the nervous system: Secondary | ICD-10-CM

## 2019-06-27 DIAGNOSIS — E785 Hyperlipidemia, unspecified: Secondary | ICD-10-CM

## 2019-06-27 DIAGNOSIS — E1069 Type 1 diabetes mellitus with other specified complication: Secondary | ICD-10-CM

## 2019-06-27 DIAGNOSIS — N179 Acute kidney failure, unspecified: Secondary | ICD-10-CM | POA: Diagnosis not present

## 2019-06-27 DIAGNOSIS — D72829 Elevated white blood cell count, unspecified: Secondary | ICD-10-CM

## 2019-06-27 DIAGNOSIS — I639 Cerebral infarction, unspecified: Secondary | ICD-10-CM

## 2019-06-27 DIAGNOSIS — F17200 Nicotine dependence, unspecified, uncomplicated: Secondary | ICD-10-CM

## 2019-06-27 LAB — GLUCOSE, POCT (MANUAL RESULT ENTRY): POC Glucose: 183 mg/dl — AB (ref 70–99)

## 2019-06-27 MED ORDER — AMLODIPINE BESYLATE 10 MG PO TABS: 10.0000 mg | ORAL_TABLET | Freq: Every day | ORAL | 1 refills | Status: DC

## 2019-06-27 MED ORDER — INSULIN NPH (HUMAN) (ISOPHANE) 100 UNIT/ML ~~LOC~~ SUSP: 15.0000 [IU] | Freq: Two times a day (BID) | SUBCUTANEOUS | 11 refills | Status: DC

## 2019-06-27 MED ORDER — ASPIRIN 325 MG PO TBEC: 325.0000 mg | DELAYED_RELEASE_TABLET | Freq: Every day | ORAL | 0 refills | Status: DC

## 2019-06-27 MED ORDER — CLONIDINE 0.1 MG/24HR TD PTWK: 0.1000 mg | MEDICATED_PATCH | TRANSDERMAL | 12 refills | Status: DC

## 2019-06-27 MED ORDER — NICOTINE POLACRILEX 4 MG MT LOZG: LOZENGE | OROMUCOSAL | 4 refills | Status: DC

## 2019-06-27 MED ORDER — HYDROCHLOROTHIAZIDE 25 MG PO TABS: 25.0000 mg | ORAL_TABLET | Freq: Every day | ORAL | 3 refills | Status: DC

## 2019-06-27 MED ORDER — CLOPIDOGREL BISULFATE 75 MG PO TABS: 75.0000 mg | ORAL_TABLET | Freq: Every day | ORAL | 2 refills | Status: DC

## 2019-06-27 MED ORDER — ATORVASTATIN CALCIUM 40 MG PO TABS: 40.0000 mg | ORAL_TABLET | Freq: Every day | ORAL | 1 refills | Status: DC

## 2019-06-27 MED ORDER — INSULIN REGULAR HUMAN 100 UNIT/ML IJ SOLN: 4.0000 [IU] | Freq: Three times a day (TID) | INTRAMUSCULAR | 6 refills | Status: DC

## 2019-06-27 MED ORDER — CARVEDILOL 25 MG PO TABS: 25.0000 mg | ORAL_TABLET | Freq: Two times a day (BID) | ORAL | 1 refills | Status: DC

## 2019-06-27 NOTE — Assessment & Plan Note (Signed)
Diabetes type 1  Will continue NPH 15 units twice daily and sliding scale regular insulin at 4 units 3 times daily adding an additional unit for blood sugars greater than 180

## 2019-06-27 NOTE — Assessment & Plan Note (Addendum)
Hypertensive urgency with improved blood pressure control today however the patient is not taking amlodipine and also has significant lower extremity edema  Plan will be to continue clonidine at 0.1 mg patch daily continue Coreg at 25 mg twice daily and resume amlodipine at 10 mg daily.  Also will begin hydrochlorothiazide at 25 mg daily for fluid control and blood pressure control  Will obtain a complete metabolic panel at this visit along with a thyroid profile and CBC

## 2019-06-27 NOTE — Assessment & Plan Note (Signed)
I spent 10 minutes of this visit on tobacco cessation and the importance of this

## 2019-06-27 NOTE — Assessment & Plan Note (Signed)
Chronic kidney disease with acute kidney injury  We will follow-up renal function today

## 2019-06-27 NOTE — Assessment & Plan Note (Signed)
Will maintain atorvastatin and recheck lipid panel

## 2019-06-27 NOTE — Assessment & Plan Note (Signed)
Patient's heart rate now is normal on beta-blocker

## 2019-06-27 NOTE — Assessment & Plan Note (Signed)
Leukocytosis  We will follow-up CBC today

## 2019-06-27 NOTE — Assessment & Plan Note (Signed)
CT scan and MRIs reviewed revealing the posterior  cerebral artery stroke distribution

## 2019-06-27 NOTE — Assessment & Plan Note (Signed)
Posterior circulation stroke involving the temporal lobe and occipital lobe on the left

## 2019-06-27 NOTE — Patient Instructions (Addendum)
Begin amlodipine I sent a prescription to your pharmacy  Use nicotine gum 4 mg 2-3 times a day for smoking cessation  Begin hydrochlorthiazide 1 daily for blood pressure and swelling  Continue all other medications as you are prescribed  Labs today will include a metabolic panel, blood count, thyroid panel, lipid panel  Referral to neurology will be made  Referral to physical therapy and speech therapy will be made  Return in 3 weeks to see Dr. Joya Gaskins   Diabetes Mellitus and Nutrition, Adult When you have diabetes (diabetes mellitus), it is very important to have healthy eating habits because your blood sugar (glucose) levels are greatly affected by what you eat and drink. Eating healthy foods in the appropriate amounts, at about the same times every day, can help you:  Control your blood glucose.  Lower your risk of heart disease.  Improve your blood pressure.  Reach or maintain a healthy weight. Every person with diabetes is different, and each person has different needs for a meal plan. Your health care provider may recommend that you work with a diet and nutrition specialist (dietitian) to make a meal plan that is best for you. Your meal plan may vary depending on factors such as:  The calories you need.  The medicines you take.  Your weight.  Your blood glucose, blood pressure, and cholesterol levels.  Your activity level.  Other health conditions you have, such as heart or kidney disease. How do carbohydrates affect me? Carbohydrates, also called carbs, affect your blood glucose level more than any other type of food. Eating carbs naturally raises the amount of glucose in your blood. Carb counting is a method for keeping track of how many carbs you eat. Counting carbs is important to keep your blood glucose at a healthy level, especially if you use insulin or take certain oral diabetes medicines. It is important to know how many carbs you can safely have in each meal.  This is different for every person. Your dietitian can help you calculate how many carbs you should have at each meal and for each snack. Foods that contain carbs include:  Bread, cereal, rice, pasta, and crackers.  Potatoes and corn.  Peas, beans, and lentils.  Milk and yogurt.  Fruit and juice.  Desserts, such as cakes, cookies, ice cream, and candy. How does alcohol affect me? Alcohol can cause a sudden decrease in blood glucose (hypoglycemia), especially if you use insulin or take certain oral diabetes medicines. Hypoglycemia can be a life-threatening condition. Symptoms of hypoglycemia (sleepiness, dizziness, and confusion) are similar to symptoms of having too much alcohol. If your health care provider says that alcohol is safe for you, follow these guidelines:  Limit alcohol intake to no more than 1 drink per day for nonpregnant women and 2 drinks per day for men. One drink equals 12 oz of beer, 5 oz of wine, or 1 oz of hard liquor.  Do not drink on an empty stomach.  Keep yourself hydrated with water, diet soda, or unsweetened iced tea.  Keep in mind that regular soda, juice, and other mixers may contain a lot of sugar and must be counted as carbs. What are tips for following this plan?  Reading food labels  Start by checking the serving size on the "Nutrition Facts" label of packaged foods and drinks. The amount of calories, carbs, fats, and other nutrients listed on the label is based on one serving of the item. Many items contain more than one  serving per package.  Check the total grams (g) of carbs in one serving. You can calculate the number of servings of carbs in one serving by dividing the total carbs by 15. For example, if a food has 30 g of total carbs, it would be equal to 2 servings of carbs.  Check the number of grams (g) of saturated and trans fats in one serving. Choose foods that have low or no amount of these fats.  Check the number of milligrams (mg) of  salt (sodium) in one serving. Most people should limit total sodium intake to less than 2,300 mg per day.  Always check the nutrition information of foods labeled as "low-fat" or "nonfat". These foods may be higher in added sugar or refined carbs and should be avoided.  Talk to your dietitian to identify your daily goals for nutrients listed on the label. Shopping  Avoid buying canned, premade, or processed foods. These foods tend to be high in fat, sodium, and added sugar.  Shop around the outside edge of the grocery store. This includes fresh fruits and vegetables, bulk grains, fresh meats, and fresh dairy. Cooking  Use low-heat cooking methods, such as baking, instead of high-heat cooking methods like deep frying.  Cook using healthy oils, such as olive, canola, or sunflower oil.  Avoid cooking with butter, cream, or high-fat meats. Meal planning  Eat meals and snacks regularly, preferably at the same times every day. Avoid going long periods of time without eating.  Eat foods high in fiber, such as fresh fruits, vegetables, beans, and whole grains. Talk to your dietitian about how many servings of carbs you can eat at each meal.  Eat 4-6 ounces (oz) of lean protein each day, such as lean meat, chicken, fish, eggs, or tofu. One oz of lean protein is equal to: ? 1 oz of meat, chicken, or fish. ? 1 egg. ?  cup of tofu.  Eat some foods each day that contain healthy fats, such as avocado, nuts, seeds, and fish. Lifestyle  Check your blood glucose regularly.  Exercise regularly as told by your health care provider. This may include: ? 150 minutes of moderate-intensity or vigorous-intensity exercise each week. This could be brisk walking, biking, or water aerobics. ? Stretching and doing strength exercises, such as yoga or weightlifting, at least 2 times a week.  Take medicines as told by your health care provider.  Do not use any products that contain nicotine or tobacco, such  as cigarettes and e-cigarettes. If you need help quitting, ask your health care provider.  Work with a Social worker or diabetes educator to identify strategies to manage stress and any emotional and social challenges. Questions to ask a health care provider  Do I need to meet with a diabetes educator?  Do I need to meet with a dietitian?  What number can I call if I have questions?  When are the best times to check my blood glucose? Where to find more information:  American Diabetes Association: diabetes.org  Academy of Nutrition and Dietetics: www.eatright.[employer] of Diabetes and Digestive and Kidney Diseases (NIH): [employer] Summary  A healthy meal plan will help you control your blood glucose and maintain a healthy lifestyle.  Working with a diet and nutrition specialist (dietitian) can help you make a meal plan that is best for you.  Keep in mind that carbohydrates (carbs) and alcohol have immediate effects on your blood glucose levels. It is important to count carbs and to  use alcohol carefully. This information is not intended to replace advice given to you by your health care provider. Make sure you discuss any questions you have with your health care provider. Document Released: 09/11/2005 Document Revised: 11/27/2017 Document Reviewed: 01/19/2017 Elsevier Patient Education  [employer].  Tobacco Use Disorder Tobacco use disorder (TUD) occurs when a person craves, seeks, and uses tobacco, regardless of the consequences. This disorder can cause problems with mental and physical health. It can affect your ability to have healthy relationships, and it can keep you from meeting your responsibilities at work, home, or school. Tobacco may be:  Smoked as a cigarette or cigar.  Inhaled using e-cigarettes.  Smoked in a pipe or hookah.  Chewed as smokeless tobacco.  Inhaled into the nostrils as snuff. Tobacco products contain a dangerous chemical  called nicotine, which is very addictive. Nicotine triggers hormones that make the body feel stimulated and works on areas of the brain that make you feel good. These effects can make it hard for people to quit nicotine. Tobacco contains many other unsafe chemicals that can damage almost every organ in the body. Smoking tobacco also puts others in danger due to fire risk and possible health problems caused by breathing in secondhand smoke. What are the signs or symptoms? Symptoms of TUD may include:  Being unable to slow down or stop your tobacco use.  Spending an abnormal amount of time getting or using tobacco.  Craving tobacco. Cravings may last for up to 6 months after quitting.  Tobacco use that: ? Interferes with your work, school, or home life. ? Interferes with your personal and social relationships. ? Makes you give up activities that you once enjoyed or found important.  Using tobacco even though you know that it is: ? Dangerous or bad for your health or someone else's health. ? Causing problems in your life.  Needing more and more of the substance to get the same effect (developing tolerance).  Experiencing unpleasant symptoms if you do not use the substance (withdrawal). Withdrawal symptoms may include: ? Depressed, anxious, or irritable mood. ? Difficulty concentrating. ? Increased appetite. ? Restlessness or trouble sleeping.  Using the substance to avoid withdrawal. How is this diagnosed? This condition may be diagnosed based on:  Your current and past tobacco use. Your health care provider may ask questions about how your tobacco use affects your life.  A physical exam. You may be diagnosed with TUD if you have at least two symptoms within a 32-month period. How is this treated? This condition is treated by stopping tobacco use. Many people are unable to quit on their own and need help. Treatment may include:  Nicotine replacement therapy (NRT). NRT provides  nicotine without the other harmful chemicals in tobacco. NRT gradually lowers the dosage of nicotine in the body and reduces withdrawal symptoms. NRT is available as: ? Over-the-counter gums, lozenges, and skin patches. ? Prescription mouth inhalers and nasal sprays.  Medicine that acts on the brain to reduce cravings and withdrawal symptoms.  A type of talk therapy that examines your triggers for tobacco use, how to avoid them, and how to cope with cravings (behavioral therapy).  Hypnosis. This may help with withdrawal symptoms.  Joining a support group for others coping with TUD. The best treatment for TUD is usually a combination of medicine, talk therapy, and support groups. Recovery can be a long process. Many people start using tobacco again after stopping (relapse). If you relapse, it does not  mean that treatment will not work. Follow these instructions at home:  Lifestyle  Do not use any products that contain nicotine or tobacco, such as cigarettes and e-cigarettes.  Avoid things that trigger tobacco use as much as you can. Triggers include people and situations that usually cause you to use tobacco.  Avoid drinks that contain caffeine, including coffee. These may worsen some withdrawal symptoms.  Find ways to manage stress. Wanting to smoke may cause stress, and stress can make you want to smoke. Relaxation techniques such as deep breathing, meditation, and yoga may help.  Attend support groups as needed. These groups are an important part of long-term recovery for many people. General instructions  Take over-the-counter and prescription medicines only as told by your health care provider.  Check with your health care provider before taking any new prescription or over-the-counter medicines.  Decide on a friend, family member, or smoking quit-line (such as 1-800-QUIT-NOW in the [place] that you can call or text when you feel the urge to smoke or when you need help coping with  cravings.  Keep all follow-up visits as told by your health care provider and therapist. This is important. Contact a health care provider if:  You are not able to take your medicines as prescribed.  Your symptoms get worse, even with treatment. Summary  Tobacco use disorder (TUD) occurs when a person craves, seeks, and uses tobacco regardless of the consequences.  This condition may be diagnosed based on your current and past tobacco use and a physical exam.  Many people are unable to quit on their own and need help. Recovery can be a long process.  The most effective treatment for TUD is usually a combination of medicine, talk therapy, and support groups. This information is not intended to replace advice given to you by your health care provider. Make sure you discuss any questions you have with your health care provider. Document Released: 08/20/2004 Document Revised: 12/02/2017 Document Reviewed: 12/02/2017 Elsevier Patient Education  2020 [employer].

## 2019-06-27 NOTE — Assessment & Plan Note (Signed)
The patient had a posterior circulation stroke and has still difficulty with word retrieval and balance and would benefit from an outpatient physical therapy and speech-language therapy consultation also would benefit from follow-up neurology  We will continue for now with the aspirin 325 mg daily, Lipitor 40 mg daily, and Plavix 75 mg daily  Will obtain a lipid panel

## 2019-06-27 NOTE — Progress Notes (Signed)
BLE edema- right greater than left

## 2019-06-28 ENCOUNTER — Telehealth: Payer: Self-pay | Admitting: Critical Care Medicine

## 2019-06-28 DIAGNOSIS — N183 Chronic kidney disease, stage 3 unspecified: Secondary | ICD-10-CM

## 2019-06-28 DIAGNOSIS — E1022 Type 1 diabetes mellitus with diabetic chronic kidney disease: Secondary | ICD-10-CM

## 2019-06-28 LAB — COMPREHENSIVE METABOLIC PANEL
ALT: 22 IU/L (ref 0–44)
AST: 14 IU/L (ref 0–40)
Albumin/Globulin Ratio: 1.8 (ref 1.2–2.2)
Albumin: 3.2 g/dL — ABNORMAL LOW (ref 4.0–5.0)
Alkaline Phosphatase: 83 IU/L (ref 39–117)
BUN/Creatinine Ratio: 13 (ref 9–20)
BUN: 32 mg/dL — ABNORMAL HIGH (ref 6–20)
Bilirubin Total: <0.2 mg/dL (ref 0.0–1.2)
CO2: 21 mmol/L (ref 20–29)
Calcium: 8.8 mg/dL (ref 8.7–10.2)
Chloride: 103 mmol/L (ref 96–106)
Creatinine, Ser: 2.41 mg/dL — ABNORMAL HIGH (ref 0.76–1.27)
GFR calc Af Amer: 40 mL/min/{1.73_m2} — ABNORMAL LOW (ref >59)
GFR calc non Af Amer: 34 mL/min/{1.73_m2} — ABNORMAL LOW (ref >59)
Globulin, Total: 1.8 g/dL (ref 1.5–4.5)
Glucose: 147 mg/dL — ABNORMAL HIGH (ref 65–99)
Potassium: 4.6 mmol/L (ref 3.5–5.2)
Sodium: 138 mmol/L (ref 134–144)
Total Protein: 5 g/dL — ABNORMAL LOW (ref 6.0–8.5)

## 2019-06-28 LAB — THYROID PANEL
Free Thyroxine Index: 1.4 (ref 1.2–4.9)
T3 Uptake Ratio: 23 % — ABNORMAL LOW (ref 24–39)
T4, Total: 6.3 ug/dL (ref 4.5–12.0)

## 2019-06-28 LAB — LIPID PANEL
Chol/HDL Ratio: 2.1 ratio (ref 0.0–5.0)
Cholesterol, Total: 156 mg/dL (ref 100–199)
HDL: 76 mg/dL (ref >39)
LDL Calculated: 71 mg/dL (ref 0–99)
Triglycerides: 46 mg/dL (ref 0–149)
VLDL Cholesterol Cal: 9 mg/dL (ref 5–40)

## 2019-06-28 NOTE — Telephone Encounter (Signed)
I spoke to pts father , pt gave permission,  Pt with poor memory and receptive aphasia d/t CVA  I gave father pts lab results.   CKD Stage III  Cr 2.4   Will need nephrology evaluation   TFT normal   Lipid at goal.

## 2019-07-16 NOTE — Progress Notes (Signed)
Subjective:    Patient ID: George [name], male    DOB: [birth year], 33 y.o.   MRN: [id number]  This is a 33 year old male has had previous history of cerebral infarction, type 1 diabetes, hypertension, tobacco use, supraventricular tachycardia, chronic kidney disease.  Since the last visit the patient has improved somewhat.  Labs did come back showing normal thyroid function and cholesterol at goal.  The patient's blood sugars have been reasonably well-controlled running fasting levels in the 1 20-1 60 range.  Note today the CBG is 180 blood pressure has come down but still not yet at goal today 160/112  The patient has yet to see speech pathology for his aphasia.  The barrier there was the patient did not have insurance and I indicated to the scheduler the patient can be self-pay  The patient has been able to reduce tobacco use down to 3 to 5 cigarettes daily and has yet to pick up the nicotine lozenges   Past Medical History:  Diagnosis Date  . AKI (acute kidney injury) ([place])   . Supraventricular tachycardia ([place])   . Type 1 diabetes ([place])      History reviewed. No pertinent family history.   Social History   Socioeconomic History  . Marital status: Single    Spouse name: Not on file  . Number of children: Not on file  . Years of education: Not on file  . Highest education level: Not on file  Occupational History  . Not on file  Social Needs  . Financial resource strain: Not on file  . Food insecurity    Worry: Not on file    Inability: Not on file  . Transportation needs    Medical: Not on file    Non-medical: Not on file  Tobacco Use  . Smoking status: Current Every Day Smoker    Packs/day: 0.50    Types: Cigarettes    Start date: 12/29/2010  . Smokeless tobacco: Never Used  Substance and Sexual Activity  . Alcohol use: Not Currently    Comment: 6 beers/week  . Drug use: Not Currently  . Sexual activity: Not on file  Lifestyle  . Physical activity    Days  per week: Not on file    Minutes per session: Not on file  . Stress: Not on file  Relationships  . Social Herbalist on phone: Not on file    Gets together: Not on file    Attends religious service: Not on file    Active member of club or organization: Not on file    Attends meetings of clubs or organizations: Not on file    Relationship status: Not on file  . Intimate partner violence    Fear of current or ex partner: Not on file    Emotionally abused: Not on file    Physically abused: Not on file    Forced sexual activity: Not on file  Other Topics Concern  . Not on file  Social History Narrative  . Not on file     No Known Allergies   Outpatient Medications Prior to Visit  Medication Sig Dispense Refill  . acetaminophen (TYLENOL) 325 MG tablet Take 650 mg by mouth every 6 (six) hours as needed for headache.    Marland Kitchen amLODipine (NORVASC) 10 MG tablet Take 1 tablet (10 mg total) by mouth daily. 30 tablet 1  . aspirin 325 MG EC tablet Take 1 tablet (325 mg total) by mouth daily. [place]  tablet 0  . atorvastatin (LIPITOR) 40 MG tablet Take 1 tablet (40 mg total) by mouth daily at 6 PM. 30 tablet 1  . carvedilol (COREG) 25 MG tablet Take 1 tablet (25 mg total) by mouth 2 (two) times daily with a meal. 60 tablet 1  . cloNIDine (CATAPRES - DOSED IN MG/24 HR) 0.1 mg/24hr patch Place 1 patch (0.1 mg total) onto the skin once a week. 4 patch 12  . clopidogrel (PLAVIX) 75 MG tablet Take 1 tablet (75 mg total) by mouth daily. 30 tablet 2  . hydrochlorothiazide (HYDRODIURIL) 25 MG tablet Take 1 tablet (25 mg total) by mouth daily. 30 tablet 3  . insulin NPH Human (NOVOLIN N) 100 UNIT/ML injection Inject 0.15 mLs (15 Units total) into the skin 2 (two) times daily with a meal. 10 mL 11  . insulin regular (NOVOLIN R) 100 units/mL injection Inject 0.04 mLs (4 Units total) into the skin 3 (three) times daily before meals. May increase by 1 unit at each dose for glucose greater than 180 10 mL 6   . nicotine polacrilex (NICORETTE MINI) 4 MG lozenge Take three times daily to stop smoking 100 tablet 4   No facility-administered medications prior to visit.     Review of Systems  Constitutional: Negative for activity change, appetite change, chills, diaphoresis, fatigue, fever and unexpected weight change.  HENT: Negative.   Eyes: Positive for visual disturbance.  Respiratory: Negative.   Cardiovascular: Negative.  Negative for palpitations.  Gastrointestinal: Negative.   Endocrine: Negative.   Genitourinary: Negative.   Musculoskeletal: Negative.   Neurological: Positive for speech difficulty. Negative for dizziness, tremors, seizures, syncope, facial asymmetry, light-headedness, numbness and headaches.  Hematological: Negative for adenopathy. Does not bruise/bleed easily.  Psychiatric/Behavioral: Negative.        Objective:   Physical Exam Vitals:   07/18/19 1106  BP: (!) 161/112  Pulse: 88  Resp: 16  Temp: 98.6 F (37 C)  TempSrc: Oral  SpO2: 100%  Weight: 119 lb 9.6 oz (54.3 kg)    Gen: Pleasant, well-nourished, in no distress,  normal affect  ENT: No lesions,  mouth clear,  oropharynx clear, no postnasal drip  Neck: No JVD, no TMG, no carotid bruits  Lungs: No use of accessory muscles, no dullness to percussion, clear without rales or rhonchi  Cardiovascular: RRR, heart sounds normal, no murmur or gallops, no peripheral edema  Abdomen: soft and NT, no HSM,  BS normal  Musculoskeletal: No deformities, no cyanosis or clubbing  Neuro: alert, non focal  Skin: Warm, no lesions or rashes BMP Latest Ref Rng & Units 06/27/2019 06/20/2019 06/19/2019  Glucose 65 - 99 mg/dL 147(H) 121(H) 125(H)  BUN 6 - 20 mg/dL 32(H) 27(H) 25(H)  Creatinine 0.76 - 1.27 mg/dL 2.41(H) 1.92(H) 2.24(H)  BUN/Creat Ratio 9 - 20 13 - -  Sodium 134 - 144 mmol/L 138 140 138  Potassium 3.5 - 5.2 mmol/L 4.6 3.5 3.7  Chloride 96 - 106 mmol/L 103 111 109  CO2 20 - 29 mmol/L 21 22 22    Calcium 8.7 - 10.2 mg/dL 8.8 8.2(L) 8.2(L)   CBC Latest Ref Rng & Units 06/18/2019 06/16/2019 06/15/2019  WBC 4.0 - 10.5 K/uL 14.1(H) 13.9(H) 17.1(H)  Hemoglobin 13.0 - 17.0 g/dL 12.5(L) 13.2 14.1  Hematocrit 39.0 - 52.0 % 35.9(L) 38.9(L) 40.8  Platelets 150 - 400 K/uL 239 272 257       Assessment & Plan:  I personally reviewed all images and lab data in the [facility]  system as well as any outside material available during this office visit and agree with the  radiology impressions.   Hypertensive urgency Hypertensive urgency with yet to achieve control on blood pressure  We will increase clonidine to 0.2 mg twice daily We will discontinue the clonidine patch once current supply runs out Continue Norvasc 10 mg daily and Coreg 25 mg twice daily Continue hydrochlorthiazide 25 mg daily  Neurologic deficit due to acute ischemic cerebrovascular accident (CVA) ([place]) Previous stroke with neurologic deficit secondary to stroke being a receptive a aphasia  Speech pathology consultation was made yet again and will ensure the patient keeps this appointment  The patient is encouraged to keep his upcoming neurology appointment  Hyperlipidemia due to type 1 diabetes mellitus ([place]) Hyperlipidemia secondary to diabetes now at goal  Diabetes mellitus type I ([place]) Diabetes type 1 with improved control we will continue current dosing of insulin   CKD (chronic kidney disease), stage III ([place]) Creatinine now down to 2.4 and the patient has a referral to nephrology has been instructed to keep that upcoming appointment  Tobacco use disorder Ongoing tobacco use disorder  The patient was asked to pick up nicotine lozenges which which we sent to our pharmacy today   [place] was seen today for follow-up.  Diagnoses and all orders for this visit:  Type 1 diabetes mellitus with stage 3 chronic kidney disease (HCC) -     POCT glucose (manual entry)  Cerebrovascular accident (CVA) due to occlusion of left  posterior cerebral artery ([place])  CKD (chronic kidney disease), stage III ([place])  Hyperlipidemia due to type 1 diabetes mellitus ([place])  Tobacco use disorder  Essential hypertension  Hypertensive urgency  Neurologic deficit due to acute ischemic cerebrovascular accident (CVA) ([place])  Other orders -     nicotine polacrilex (NICORETTE MINI) 4 MG lozenge; Take three times daily to stop smoking -     cloNIDine (CATAPRES) 0.2 MG tablet; Take one tablet daily until clonidine patch runs out then increase to twice daily   Pneumovax and also Tdap was given at this visit

## 2019-07-18 ENCOUNTER — Other Ambulatory Visit: Payer: Self-pay

## 2019-07-18 ENCOUNTER — Ambulatory Visit: Payer: Medicaid Other | Attending: Critical Care Medicine | Admitting: Critical Care Medicine

## 2019-07-18 ENCOUNTER — Encounter: Payer: Self-pay | Admitting: Critical Care Medicine

## 2019-07-18 ENCOUNTER — Telehealth: Payer: Self-pay | Admitting: Adult Health

## 2019-07-18 VITALS — BP 161/112 | HR 88 | Temp 98.6°F | Resp 16 | Wt 119.6 lb

## 2019-07-18 DIAGNOSIS — E1069 Type 1 diabetes mellitus with other specified complication: Secondary | ICD-10-CM

## 2019-07-18 DIAGNOSIS — F17200 Nicotine dependence, unspecified, uncomplicated: Secondary | ICD-10-CM

## 2019-07-18 DIAGNOSIS — R29818 Other symptoms and signs involving the nervous system: Secondary | ICD-10-CM

## 2019-07-18 DIAGNOSIS — E1022 Type 1 diabetes mellitus with diabetic chronic kidney disease: Secondary | ICD-10-CM

## 2019-07-18 DIAGNOSIS — I63532 Cerebral infarction due to unspecified occlusion or stenosis of left posterior cerebral artery: Secondary | ICD-10-CM

## 2019-07-18 DIAGNOSIS — F17210 Nicotine dependence, cigarettes, uncomplicated: Secondary | ICD-10-CM | POA: Diagnosis not present

## 2019-07-18 DIAGNOSIS — I10 Essential (primary) hypertension: Secondary | ICD-10-CM

## 2019-07-18 DIAGNOSIS — N183 Chronic kidney disease, stage 3 unspecified: Secondary | ICD-10-CM

## 2019-07-18 DIAGNOSIS — I160 Hypertensive urgency: Secondary | ICD-10-CM | POA: Diagnosis not present

## 2019-07-18 DIAGNOSIS — I639 Cerebral infarction, unspecified: Secondary | ICD-10-CM

## 2019-07-18 DIAGNOSIS — Z23 Encounter for immunization: Secondary | ICD-10-CM

## 2019-07-18 DIAGNOSIS — E785 Hyperlipidemia, unspecified: Secondary | ICD-10-CM

## 2019-07-18 LAB — GLUCOSE, POCT (MANUAL RESULT ENTRY): POC Glucose: 183 mg/dl — AB (ref 70–99)

## 2019-07-18 MED ORDER — CLONIDINE HCL 0.2 MG PO TABS: ORAL_TABLET | ORAL | 1 refills | Status: DC

## 2019-07-18 MED ORDER — NICOTINE POLACRILEX 4 MG MT LOZG: LOZENGE | OROMUCOSAL | 4 refills | Status: DC

## 2019-07-18 NOTE — Telephone Encounter (Signed)
I called patient and LVM regarding his 7/27 appointment. I advised that due to schedule changes his appt will need to be a virtual visit, or rescheduled to a different day for in office visit. I requested patient call back.

## 2019-07-18 NOTE — Addendum Note (Signed)
Addended by: Jackelyn Knife on: 07/18/2019 01:48 PM   Modules accepted: Orders

## 2019-07-18 NOTE — Assessment & Plan Note (Signed)
Previous stroke with neurologic deficit secondary to stroke being a receptive a aphasia  Speech pathology consultation was made yet again and will ensure the patient keeps this appointment  The patient is encouraged to keep his upcoming neurology appointment

## 2019-07-18 NOTE — Assessment & Plan Note (Signed)
Hyperlipidemia secondary to diabetes now at goal

## 2019-07-18 NOTE — Assessment & Plan Note (Signed)
Diabetes type 1 with improved control we will continue current dosing of insulin

## 2019-07-18 NOTE — Patient Instructions (Addendum)
We will obtain for you a speech-language pathology referral you may need to pay out-of-pocket for this consult  Clonidine 0.2 mg was sent to your local pharmacy and you are to take 1 of these a day and when the clonidine patch you are using that you changed weekly runs out you would then increase the clonidine pill to twice a day  No other medication changes  A prescription for nicotine lozenge was sent to our pharmacy for you to pick up  Keep your neurology and kidney doctor appointments  Return in 8 weeks to establish with primary care in this office   Td Vaccine (Tetanus and Diphtheria): What You Need to Know 1. Why get vaccinated? Tetanus  and diphtheria are very serious diseases. They are rare in the [place] today, but people who do become infected often have severe complications. Td vaccine is used to protect adolescents and adults from both of these diseases. Both tetanus and diphtheria are infections caused by bacteria. Diphtheria spreads from person to person through coughing or sneezing. Tetanus-causing bacteria enter the body through cuts, scratches, or wounds. TETANUS (Lockjaw) causes painful muscle tightening and stiffness, usually all over the body.  It can lead to tightening of muscles in the head and neck so you can't open your mouth, swallow, or sometimes even breathe. Tetanus kills about 1 out of every 10 people who are infected even after receiving the best medical care. DIPHTHERIA can cause a thick coating to form in the back of the throat.  It can lead to breathing problems, paralysis, heart failure, and death. Before vaccines, as many as 200,000 cases of diphtheria and hundreds of cases of tetanus were reported in the [place] each year. Since vaccination began, reports of cases for both diseases have dropped by about 99%. 2. Td vaccine Td vaccine can protect adolescents and adults from tetanus and diphtheria. Td is usually given as a booster dose every 10  years but it can also be given earlier after a severe and dirty wound or burn. Another vaccine, called Tdap, which protects against pertussis in addition to tetanus and diphtheria, is sometimes recommended instead of Td vaccine. Your doctor or the person giving you the vaccine can give you more information. Td may safely be given at the same time as other vaccines. 3. Some people should not get this vaccine  A person who has ever had a life-threatening allergic reaction after a previous dose of any tetanus or diphtheria containing vaccine, OR has a severe allergy to any part of this vaccine, should not get Td vaccine. Tell the person giving the vaccine about any severe allergies.  Talk to your doctor if you: ? had severe pain or swelling after any vaccine containing diphtheria or tetanus, ? ever had a condition called Guillain Barr Syndrome (GBS), ? aren't feeling well on the day the shot is scheduled. 4. Risks of a vaccine reaction With any medicine, including vaccines, there is a chance of side effects. These are usually mild and go away on their own. Serious reactions are also possible but are rare. Most people who get Td vaccine do not have any problems with it. Mild Problems following Td vaccine: (Did not interfere with activities)  Pain where the shot was given (about 8 people in 10)  Redness or swelling where the shot was given (about 1 person in 4)  Mild fever (rare)  Headache (about 1 person in 4)  Tiredness (about 1 person in 4) Moderate Problems following  Td vaccine: (Interfered with activities, but did not require medical attention)  Fever over 102F (rare) Severe Problems following Td vaccine: (Unable to perform usual activities; required medical attention)  Swelling, severe pain, bleeding and/or redness in the arm where the shot was given (rare). Problems that could happen after any vaccine:  People sometimes faint after a medical procedure, including vaccination.  Sitting or lying down for about 15 minutes can help prevent fainting, and injuries caused by a fall. Tell your doctor if you feel dizzy, or have vision changes or ringing in the ears.  Some people get severe pain in the shoulder and have difficulty moving the arm where a shot was given. This happens very rarely.  Any medication can cause a severe allergic reaction. Such reactions from a vaccine are very rare, estimated at fewer than 1 in a million doses, and would happen within a few minutes to a few hours after the vaccination. As with any medicine, there is a very remote chance of a vaccine causing a serious injury or death. The safety of vaccines is always being monitored. For more information, visit: http://www.aguilar.org/ 5. What if there is a serious reaction? What should I look for?  Look for anything that concerns you, such as signs of a severe allergic reaction, very high fever, or unusual behavior. Signs of a severe allergic reaction can include hives, swelling of the face and throat, difficulty breathing, a fast heartbeat, dizziness, and weakness. These would usually start a few minutes to a few hours after the vaccination. What should I do?  If you think it is a severe allergic reaction or other emergency that can't wait, call 9-1-1 or get the person to the nearest hospital. Otherwise, call your doctor.  Afterward, the reaction should be reported to the Vaccine Adverse Event Reporting System (VAERS). Your doctor might file this report, or you can do it yourself through the VAERS web site at www.vaers.SamedayNews.es, or by calling 559-203-5336. VAERS does not give medical advice. 6. The National Vaccine Injury Compensation Program The [employer] Vaccine Injury Compensation Program (VICP) is a federal program that was created to compensate people who may have been injured by certain vaccines. Persons who believe they may have been injured by a vaccine can learn about the program and about  filing a claim by calling 310 716 1037 or visiting the [place] website at GoldCloset.com.ee. There is a time limit to file a claim for compensation. 7. How can I learn more?  Ask your doctor. He or she can give you the vaccine package insert or suggest other sources of information.  Call your local or state health department.  Contact the Centers for Disease Control and Prevention (CDC): ? Call (660)866-7948 (1-800-CDC-INFO) ? Visit CDC's website at http://hunter.com/ Vaccine Information Statement Td Vaccine (04/08/16) This information is not intended to replace advice given to you by your health care provider. Make sure you discuss any questions you have with your health care provider. Document Released: 10/12/2006 Document Revised: 08/02/2018 Document Reviewed: 08/02/2018 Elsevier Interactive Patient Education  [place].    Pneumococcal Polysaccharide Vaccine (PPSV23): What You Need to Know 1. Why get vaccinated? Pneumococcal polysaccharide vaccine (PPSV23) can prevent pneumococcal disease. Pneumococcal disease refers to any illness caused by pneumococcal bacteria. These bacteria can cause many types of illnesses, including pneumonia, which is an infection of the lungs. Pneumococcal bacteria are one of the most common causes of pneumonia. Besides pneumonia, pneumococcal bacteria can also cause:  Ear infections  Sinus infections  Meningitis (  infection of the tissue covering the brain and spinal cord)  Bacteremia (bloodstream infection) Anyone can get pneumococcal disease, but children under 27 years of age, people with certain medical conditions, adults 74 years or older, and cigarette smokers are at the highest risk. Most pneumococcal infections are mild. However, some can result in long-term problems, such as brain damage or hearing loss. Meningitis, bacteremia, and pneumonia caused by pneumococcal disease can be fatal. 2. PPSV23 PPSV23 protects against  23 types of bacteria that cause pneumococcal disease. PPSV23 is recommended for:  All adults 80 years or older,  Anyone 2 years or older with certain medical conditions that can lead to an increased risk for pneumococcal disease. Most people need only one dose of PPSV23. A second dose of PPSV23, and another type of pneumococcal vaccine called PCV13, are recommended for certain high-risk groups. Your health care provider can give you more information. People 65 years or older should get a dose of PPSV23 even if they have already gotten one or more doses of the vaccine before they turned 89. 3. Talk with your health care provider Tell your vaccine provider if the person getting the vaccine:  Has had an allergic reaction after a previous dose of PPSV23, or has any severe, life-threatening allergies. In some cases, your health care provider may decide to postpone PPSV23 vaccination to a future visit. People with minor illnesses, such as a cold, may be vaccinated. People who are moderately or severely ill should usually wait until they recover before getting PPSV23. Your health care provider can give you more information. 4. Risks of a vaccine reaction  Redness or pain where the shot is given, feeling tired, fever, or muscle aches can happen after PPSV23. People sometimes faint after medical procedures, including vaccination. Tell your provider if you feel dizzy or have vision changes or ringing in the ears. As with any medicine, there is a very remote chance of a vaccine causing a severe allergic reaction, other serious injury, or death. 5. What if there is a serious problem? An allergic reaction could occur after the vaccinated person leaves the clinic. If you see signs of a severe allergic reaction (hives, swelling of the face and throat, difficulty breathing, a fast heartbeat, dizziness, or weakness), call 9-1-1 and get the person to the nearest hospital. For other signs that concern you, call  your health care provider. Adverse reactions should be reported to the Vaccine Adverse Event Reporting System (VAERS). Your health care provider will usually file this report, or you can do it yourself. Visit the VAERS website at www.vaers.SamedayNews.es or call 505 781 2317. VAERS is only for reporting reactions, and VAERS staff do not give medical advice. 6. How can I learn more?  Ask your health care provider.  Call your local or state health department.  Contact the Centers for Disease Control and Prevention (CDC): ? Call 630-086-8766 (1-800-CDC-INFO) or ? Visit CDC's website at http://hunter.com/ CDC Vaccine Information Statement PPSV23 Vaccine (10/27/2018) This information is not intended to replace advice given to you by your health care provider. Make sure you discuss any questions you have with your health care provider. Document Released: 10/12/2006 Document Revised: 04/05/2019 Document Reviewed: 07/27/2018 Elsevier Patient Education  2020 [employer].

## 2019-07-18 NOTE — Assessment & Plan Note (Signed)
Ongoing tobacco use disorder  The patient was asked to pick up nicotine lozenges which which we sent to our pharmacy today

## 2019-07-18 NOTE — Assessment & Plan Note (Signed)
Creatinine now down to 2.4 and the patient has a referral to nephrology has been instructed to keep that upcoming appointment

## 2019-07-18 NOTE — Assessment & Plan Note (Signed)
Hypertensive urgency with yet to achieve control on blood pressure  We will increase clonidine to 0.2 mg twice daily We will discontinue the clonidine patch once current supply runs out Continue Norvasc 10 mg daily and Coreg 25 mg twice daily Continue hydrochlorthiazide 25 mg daily

## 2019-07-20 ENCOUNTER — Encounter: Payer: Self-pay | Admitting: Adult Health

## 2019-07-20 NOTE — Telephone Encounter (Signed)
Attempted to contact patient again to discuss virtual visit or reschedule, LVM requesting patient call back. Appointment has been cancelled. Letter sent to patient to advise.

## 2019-07-25 ENCOUNTER — Inpatient Hospital Stay: Payer: Self-pay | Admitting: Adult Health

## 2019-07-26 ENCOUNTER — Other Ambulatory Visit: Payer: Self-pay

## 2019-07-26 ENCOUNTER — Encounter: Payer: Self-pay | Admitting: Adult Health

## 2019-07-26 ENCOUNTER — Ambulatory Visit: Payer: Self-pay | Admitting: Adult Health

## 2019-07-26 ENCOUNTER — Ambulatory Visit: Payer: Self-pay | Attending: Family Medicine

## 2019-07-26 VITALS — BP 139/83 | HR 79 | Temp 98.6°F | Ht 63.0 in | Wt 136.4 lb

## 2019-07-26 DIAGNOSIS — E1022 Type 1 diabetes mellitus with diabetic chronic kidney disease: Secondary | ICD-10-CM

## 2019-07-26 DIAGNOSIS — I63532 Cerebral infarction due to unspecified occlusion or stenosis of left posterior cerebral artery: Secondary | ICD-10-CM

## 2019-07-26 DIAGNOSIS — N183 Chronic kidney disease, stage 3 unspecified: Secondary | ICD-10-CM

## 2019-07-26 DIAGNOSIS — I151 Hypertension secondary to other renal disorders: Secondary | ICD-10-CM

## 2019-07-26 DIAGNOSIS — N2889 Other specified disorders of kidney and ureter: Secondary | ICD-10-CM

## 2019-07-26 DIAGNOSIS — E785 Hyperlipidemia, unspecified: Secondary | ICD-10-CM

## 2019-07-26 NOTE — Progress Notes (Signed)
[facility] [address] [place]. [name] [address] [phone]       [place]  [name] Date of Birth:  [birth year] Medical Record Number:  [id number]   Reason for Referral:  hospital stroke follow up    CHIEF COMPLAINT:  Chief Complaint  Patient presents with   Hospitalization Follow-up    Rm 27, mother, fluid build up in legs, bloated abdominal.     Cerebrovascular Accident    HPI: George Sowinskiis being seen today for in office hospital follow-up regarding left PICA infarct embolic pattern secondary to large vessel disease versus cardioembolic source on 04/13/6062.  History obtained from patient, mother and chart review. Reviewed all radiology images and labs personally.  [name] is a 33 y.o. male with history of type I DB presented on 06/12/2019 with receptive aphasia and word salad responses, not oriented with HA.  Stroke work-up revealed large left PCA infarct embolic pattern secondary to large vessel disease versus cardioembolic source.  Initial CT head showed large left PCA infarct.  MRI brain showed large left PCA infarct with petechial hemorrhage left occipital lobe HI 1.  Carotid Doppler and 2D echo unremarkable.  Lower extremity venous Dopplers negative for DVT.  TEE did not show evidence of PFO or cardiac source of embolus.  Recommended 30-day cardiac event monitor outpatient to assess for atrial fibrillation.  Hypercoagulable/vasculitis work-up negative.  LDL 102 and A1c 8.4.  Recommended DAPT for 3 months then aspirin alone due to intracranial stenosis.  No prior history of HTN with elevation during admission and started on amlodipine 10 mg and metoprolol 50 mg twice daily.  Also found to have CKD, stage III with referral to nephrology.  Initiated atorvastatin 40 mg daily.  Advised to follow-up with PCP for uncontrolled DM.  Initial finding of leukocytosis improved during admission with work-up unremarkable and likely  reactive.  Other stroke risk factors include EtOH use, THC use and tobacco use.  He was discharged home in stable condition with residual aphasia.  Residual deficits expressive aphasia and delayed recall.  He does endorse overall improvement with only occasional word finding difficulty.  He is currently in the process of obtaining insurance therefore therapies were not started.  Has returned to playing video games with improvement - was having difficulty initially  He continues to live at home with his mother but is able to perform all prior activities He does have complaints of swelling bilateral lower extremity swelling - started once he started his new prescriptions; he does have appointment next Monday with Nephrology for initial evaluation due to elevated creatinine and CKD stage III Continues on aspirin and Plavix without bleeding or bruising Continues on atorvastatin without myalgias -recent lipid panel by PCP LDL 71 Blood pressure stable at 139/83 No further concerns at this time     ROS:   14 system review of systems performed and negative with exception of speech difficulty and memory loss  PMH:  Past Medical History:  Diagnosis Date   AKI (acute kidney injury) ([place])    Supraventricular tachycardia ([place])    Type 1 diabetes ([place])     PSH:  Past Surgical History:  Procedure Laterality Date   BUBBLE STUDY  06/20/2019   Procedure: BUBBLE STUDY;  Surgeon: Fay Records, MD;  Location: [facility];  Service: Cardiovascular;;   TEE WITHOUT CARDIOVERSION N/A 06/20/2019   Procedure: TRANSESOPHAGEAL ECHOCARDIOGRAM (TEE);  Surgeon: Fay Records, MD;  Location: [facility];  Service: Cardiovascular;  Laterality: N/A;    Social History:  Social History   Socioeconomic History   Marital status: Single    Spouse name: Not on file   Number of children: Not on file   Years of education: Not on file   Highest education level: Not on file  Occupational History   Not on  file  Social Needs   Financial resource strain: Not on file   Food insecurity    Worry: Not on file    Inability: Not on file   Transportation needs    Medical: Not on file    Non-medical: Not on file  Tobacco Use   Smoking status: Current Every Day Smoker    Packs/day: 0.50    Types: Cigarettes    Start date: 12/29/2010   Smokeless tobacco: Never Used  Substance and Sexual Activity   Alcohol use: Not Currently    Comment: 6 beers/week   Drug use: Not Currently   Sexual activity: Not on file  Lifestyle   Physical activity    Days per week: Not on file    Minutes per session: Not on file   Stress: Not on file  Relationships   Social connections    Talks on phone: Not on file    Gets together: Not on file    Attends religious service: Not on file    Active member of club or organization: Not on file    Attends meetings of clubs or organizations: Not on file    Relationship status: Not on file   Intimate partner violence    Fear of current or ex partner: Not on file    Emotionally abused: Not on file    Physically abused: Not on file    Forced sexual activity: Not on file  Other Topics Concern   Not on file  Social History Narrative   Not on file    Family History: History reviewed. No pertinent family history.  Medications:   Current Outpatient Medications on File Prior to Visit  Medication Sig Dispense Refill   acetaminophen (TYLENOL) 325 MG tablet Take 650 mg by mouth every 6 (six) hours as needed for headache.     amLODipine (NORVASC) 10 MG tablet Take 1 tablet (10 mg total) by mouth daily. 30 tablet 1   aspirin 325 MG EC tablet Take 1 tablet (325 mg total) by mouth daily. 30 tablet 0   atorvastatin (LIPITOR) 40 MG tablet Take 1 tablet (40 mg total) by mouth daily at 6 PM. 30 tablet 1   carvedilol (COREG) 25 MG tablet Take 1 tablet (25 mg total) by mouth 2 (two) times daily with a meal. 60 tablet 1   cloNIDine (CATAPRES - DOSED IN MG/24 HR) 0.1  mg/24hr patch Place 1 patch (0.1 mg total) onto the skin once a week. 4 patch 12   cloNIDine (CATAPRES) 0.2 MG tablet Take one tablet daily until clonidine patch runs out then increase to twice daily 90 tablet 1   clopidogrel (PLAVIX) 75 MG tablet Take 1 tablet (75 mg total) by mouth daily. 30 tablet 2   hydrochlorothiazide (HYDRODIURIL) 25 MG tablet Take 1 tablet (25 mg total) by mouth daily. 30 tablet 3   insulin NPH Human (NOVOLIN N) 100 UNIT/ML injection Inject 0.15 mLs (15 Units total) into the skin 2 (two) times daily with a meal. 10 mL 11   insulin regular (NOVOLIN R) 100 units/mL injection Inject 0.04 mLs (4 Units total) into the skin 3 (three) times daily  before meals. May increase by 1 unit at each dose for glucose greater than 180 10 mL 6   nicotine polacrilex (NICORETTE MINI) 4 MG lozenge Take three times daily to stop smoking 100 tablet 4   No current facility-administered medications on file prior to visit.     Allergies:  No Known Allergies   Physical Exam  Vitals:   07/26/19 1515  BP: 139/83  Pulse: 79  Temp: 98.6 F (37 C)  Weight: 136 lb 6.4 oz (61.9 kg)  Height: 5\' 3"  (1.6 m)   Body mass index is 24.16 kg/m. No exam data present  Depression screen [facility] 2/9 07/26/2019  Decreased Interest 0  Down, Depressed, Hopeless 0  PHQ - 2 Score 0  Altered sleeping -  Tired, decreased energy -  Change in appetite -  Feeling bad or failure about yourself  -  Trouble concentrating -  Moving slowly or fidgety/restless -  Suicidal thoughts -  PHQ-9 Score -     General: well developed, well nourished,  pleasant middle-age Caucasian male, seated, in no evident distress Head: head normocephalic and atraumatic.   Neck: supple with no carotid or supraclavicular bruits Cardiovascular: regular rate and rhythm, no murmurs Musculoskeletal: no deformity Skin:  no rash/petichiae Vascular:  Normal pulses all extremities   Neurologic Exam Mental Status: Awake and fully  alert.   Unable to appreciate speech difficulty.  Oriented to place and time. Recent and remote memory intact. Attention span, concentration and fund of knowledge appropriate. Mood and affect appropriate.  Cranial Nerves: Fundoscopic exam reveals sharp disc margins. Pupils equal, briskly reactive to light. Extraocular movements full without nystagmus. Visual fields full to confrontation. Hearing intact. Facial sensation intact. Face, tongue, palate moves normally and symmetrically.  Motor: Normal bulk and tone. Normal strength in all tested extremity muscles. Sensory.: intact to touch , pinprick , position and vibratory sensation.  Coordination: Rapid alternating movements normal in all extremities. Finger-to-nose and heel-to-shin performed accurately bilaterally. Gait and Station: Arises from chair without difficulty. Stance is normal. Gait demonstrates normal stride length and balance Reflexes: 1+ and symmetric. Toes downgoing.     NIHSS  0 Modified Rankin  1    Diagnostic Data (Labs, Imaging, Testing)  CT HEAD WO CONTRAST 06/12/2019 IMPRESSION: Large area of hypoattenuation in the left PCA distribution probably representing a late acute to early subacute infarction. No hemorrhage or gross mass effect. Differential of underlying neoplasm or cerebritis is less likely given typical vascular distribution.  MR BRAIN WO CONTRAST MR MRA HEAD  06/14/2019 IMPRESSION: MRI head: Large left PCA distribution late acute/early subacute infarction. Petechial hemorrhage in left occipital lobe, [place] calssification 1a: HI1, scattered small petechiae, no mass effect. MRA head: 1. Severe stenosis of a large left posterior temporal artery branch of the left PCA. 2. No proximal large vessel occlusion, aneurysm, or high-grade stenosis.  ECHOCARDIOGRAM 06/15/2019 IMPRESSIONS  1. The left ventricle has low normal systolic function, with an ejection fraction of 50-55%. The cavity size was  normal. Indeterminate diastolic filling due to E-A fusion.  2. The right ventricle has normal systolic function. The cavity was normal. There is no increase in right ventricular wall thickness.  3. Atrial septal color Doppler challenging to interpreter. Cannot exclude PFO though no definite L to R shunt detected. Consider transthoracic agitated saline exam.  4. No intracardiac thrombi or masses were visualized.  Echo TEE 06/20/2019 IMPRESSIONS  1. The cavity size was normal. There is mildly increased left ventricular wall thickness.  2.  LA, LAA without masses.  3. No PFO by color doppler or as tested with injection of agitated saline.  4. The aortic valve is tricuspid.  5. The aortic root, ascending aorta, aortic arch and descending aorta are normal in size and structure.  VAS US CAROTID DUPLEX BILATERAL 06/13/2019 Summary: Right Carotid: The extracranial vessels were near-normal with only minimal wall                thickening or plaque. Highly tortuous ICA. Left Carotid: Unable to scan left ICA and ECA secondary to patient compliance. Vertebrals:  Bilateral vertebral arteries demonstrate antegrade flow. Subclavians: Normal flow hemodynamics were seen in bilateral subclavian              arteries.  VAS [place] LOWER REMEDY VENOUS BILATERAL 06/13/2019 Summary: Right: There is no evidence of deep vein thrombosis in the lower extremity. Left: There is no evidence of deep vein thrombosis in the lower extremity.     ASSESSMENT: Rubin Dais is a 33 y.o. year old male here with large left PCA infarct embolic pattern on 01/28/8656 secondary to large vessel disease source versus cardioembolic. Vascular risk factors include DM1, HTN, HLD, CKD stage III, EtOH use, THC use and tobacco use.  Residual deficits of mild expressive aphasia but improvement of cognition.     PLAN:  1. Left PCA infarct : Continue aspirin 81 mg daily and clopidogrel 75 mg daily  and atorvastatin for secondary stroke  prevention.  Discontinue Plavix and continue on aspirin alone after 09/17/2019.  Maintain strict control of hypertension with blood pressure goal below 130/90, diabetes with hemoglobin A1c goal below 6.5% and cholesterol with LDL cholesterol (bad cholesterol) goal below 70 mg/dL.  I also advised the patient to eat a healthy diet with plenty of whole grains, cereals, fruits and vegetables, exercise regularly with at least 30 minutes of continuous activity daily and maintain ideal body weight. 2. HTN: Advised to continue current treatment regimen.  Today's BP 139/83.  Advised to continue to monitor at home along with continued follow-up with PCP for management 3. HLD: Advised to continue current treatment regimen along with continued follow-up with PCP for future prescribing and monitoring of lipid panel 4. DMI: Advised to continue to monitor glucose levels at home along with continued follow-up with PCP for management and monitoring 5. CKD stage III: Initial evaluation with nephrology next week 6. After discussion with Dr. Leonie Man and review of imaging, it was felt as though infarct was likely second to cardioembolic source as MRA head showed likely occlusion of left vertebral artery and not stenosis.  Initially recommended participation in [place] trial but unfortunately does not qualify due to age.  Recommend undergoing 30-day cardiac event monitor to rule out potential atrial fibrillation as cause of stroke.  2D echo and TEE negative for PFO or identifiable cardiac source of embolus.  Hypercoagulable/vasculitis work-up negative.    Follow up in 3 months or call earlier if needed   Greater than 50% of time during this 45 minute visit was spent on counseling, explanation of diagnosis of left PCA infarct, reviewing risk factor management of HTN, HLD, DM and CKD, discussion regarding likelihood of stroke etiology cardioembolic, planning of further management along with potential future management, and  discussion with patient and family answering all questions.    Venancio Poisson, AGNP-BC  [facility] [address] [place],  [address]  Phone [phone] Fax [phone] Note: This document was prepared with digital dictation and possible smart  Company secretary. Any transcriptional errors that result from this process are unintentional.

## 2019-07-26 NOTE — Patient Instructions (Signed)
Continue aspirin 81 mg daily and clopidogrel 75 mg daily  and Lipitor for secondary stroke prevention  Discontinue Plavix and continue on aspirin alone after 09/17/2019 as at that time you have completed 3 months of therapy  Continue to follow up with PCP regarding cholesterol, blood pressure and diabetes management   folow up with nephrology next week - you can also discuss issues with swelling and blood pressure   Continue to monitor blood pressure at home  Maintain strict control of hypertension with blood pressure goal below 130/90, diabetes with hemoglobin A1c goal below 6.5% and cholesterol with LDL cholesterol (bad cholesterol) goal below 70 mg/dL. I also advised the patient to eat a healthy diet with plenty of whole grains, cereals, fruits and vegetables, exercise regularly and maintain ideal body weight.  Followup in the future with me in 3 months or call earlier if needed       Thank you for coming to see [place] at [facility]. I hope we have been able to provide you high quality care today.  You may receive a patient satisfaction survey over the next few weeks. We would appreciate your feedback and comments so that we may continue to improve ourselves and the health of our patients.

## 2019-07-30 ENCOUNTER — Encounter: Payer: Self-pay | Admitting: Adult Health

## 2019-08-01 DIAGNOSIS — N183 Chronic kidney disease, stage 3 (moderate): Secondary | ICD-10-CM | POA: Diagnosis not present

## 2019-08-01 DIAGNOSIS — R809 Proteinuria, unspecified: Secondary | ICD-10-CM | POA: Diagnosis not present

## 2019-08-08 NOTE — Progress Notes (Signed)
I agree with the above plan 

## 2019-08-12 ENCOUNTER — Other Ambulatory Visit: Payer: Self-pay | Admitting: Nephrology

## 2019-08-12 DIAGNOSIS — N183 Chronic kidney disease, stage 3 unspecified: Secondary | ICD-10-CM

## 2019-09-18 NOTE — Progress Notes (Signed)
Subjective:    Patient ID: George [name], male    DOB: [birth year], 33 y.o.   MRN: [id number]  This is a 33 year old male has had previous history of cerebral infarction, type 1 diabetes, hypertension, tobacco use, supraventricular tachycardia, chronic kidney disease.  Since the last visit the patient has improved somewhat.  Labs did come back showing normal thyroid function and cholesterol at goal.  The patient's blood sugars have been reasonably well-controlled running fasting levels in the 1 20-1 60 range.  Note today the CBG is 180 blood pressure has come down but still not yet at goal today 160/112  The patient has yet to see speech pathology for his aphasia.  The barrier there was the patient did not have insurance and I indicated to the scheduler the patient can be self-pay  The patient has been able to reduce tobacco use down to 3 to 5 cigarettes daily and has yet to pick up the nicotine lozenges   09/19/2019 This is a follow-up visit for this 33 year old male with type 1 diabetes, hypertension, tobacco use, chronic kidney disease, and previous history of cerebral infarction.  The patient states apart from some fatigue he is doing fairly well.  He is now smoking 2 packs of cigarettes a week.  His blood pressure at home ranges been 130-135/80-85 range.  His blood sugars have been in the 1 20-1 50s with average is 1 30-1 40 on his meter.  His sugar will go down as low as 60s before dinner.  The patient does have history of receptive a aphasia and notes when he is playing his video games he will remember some names but not all the names of the characters in the game.  He has difficulty with memory retrieval.  He saw speech pathology 1 time and has not yet made a follow-up visit.    Patient did see nephrology and has a follow-up visit pending.  At the last visit the patient not receive any labs.  The patient denies any chest pain or shortness of breath.  Past Medical History:  Diagnosis  Date  . AKI (acute kidney injury) ([place])   . Cerebral infarction ([place])   . Neurologic deficit due to acute ischemic cerebrovascular accident (CVA) ([place]) 06/13/2019  . Supraventricular tachycardia ([place])   . Type 1 diabetes ([place])      No family history on file.   Social History   Socioeconomic History  . Marital status: Single    Spouse name: Not on file  . Number of children: Not on file  . Years of education: Not on file  . Highest education level: Not on file  Occupational History  . Not on file  Social Needs  . Financial resource strain: Not on file  . Food insecurity    Worry: Not on file    Inability: Not on file  . Transportation needs    Medical: Not on file    Non-medical: Not on file  Tobacco Use  . Smoking status: Current Every Day Smoker    Packs/day: 0.50    Types: Cigarettes    Start date: 12/29/2010  . Smokeless tobacco: Never Used  Substance and Sexual Activity  . Alcohol use: Not Currently    Comment: 6 beers/week  . Drug use: Not Currently  . Sexual activity: Not on file  Lifestyle  . Physical activity    Days per week: Not on file    Minutes per session: Not on file  . Stress: Not  on file  Relationships  . Social Herbalist on phone: Not on file    Gets together: Not on file    Attends religious service: Not on file    Active member of club or organization: Not on file    Attends meetings of clubs or organizations: Not on file    Relationship status: Not on file  . Intimate partner violence    Fear of current or ex partner: Not on file    Emotionally abused: Not on file    Physically abused: Not on file    Forced sexual activity: Not on file  Other Topics Concern  . Not on file  Social History Narrative  . Not on file     No Known Allergies   Outpatient Medications Prior to Visit  Medication Sig Dispense Refill  . acetaminophen (TYLENOL) 325 MG tablet Take 650 mg by mouth every 6 (six) hours as needed for headache.    Marland Kitchen  amLODipine (NORVASC) 10 MG tablet Take 1 tablet (10 mg total) by mouth daily. 30 tablet 1  . aspirin 325 MG EC tablet Take 1 tablet (325 mg total) by mouth daily. 30 tablet 0  . clopidogrel (PLAVIX) 75 MG tablet Take 1 tablet (75 mg total) by mouth daily. 30 tablet 2  . insulin NPH Human (NOVOLIN N) 100 UNIT/ML injection Inject 0.15 mLs (15 Units total) into the skin 2 (two) times daily with a meal. 10 mL 11  . insulin regular (NOVOLIN R) 100 units/mL injection Inject 0.04 mLs (4 Units total) into the skin 3 (three) times daily before meals. May increase by 1 unit at each dose for glucose greater than 180 10 mL 6  . nicotine polacrilex (NICORETTE MINI) 4 MG lozenge Take three times daily to stop smoking 100 tablet 4  . atorvastatin (LIPITOR) 40 MG tablet Take 1 tablet (40 mg total) by mouth daily at 6 PM. 30 tablet 1  . carvedilol (COREG) 25 MG tablet Take 1 tablet (25 mg total) by mouth 2 (two) times daily with a meal. 60 tablet 1  . cloNIDine (CATAPRES) 0.2 MG tablet Take one tablet daily until clonidine patch runs out then increase to twice daily 90 tablet 1  . cloNIDine (CATAPRES - DOSED IN MG/24 HR) 0.1 mg/24hr patch Place 1 patch (0.1 mg total) onto the skin once a week. (Patient not taking: Reported on 09/19/2019) 4 patch 12  . hydrochlorothiazide (HYDRODIURIL) 25 MG tablet Take 1 tablet (25 mg total) by mouth daily. (Patient not taking: Reported on 09/19/2019) 30 tablet 3   No facility-administered medications prior to visit.     Review of Systems  Constitutional: Negative for activity change, appetite change, chills, diaphoresis, fatigue, fever and unexpected weight change.  HENT: Negative.   Eyes: Positive for visual disturbance.  Respiratory: Negative.   Cardiovascular: Negative.  Negative for palpitations.  Gastrointestinal: Positive for constipation.  Endocrine: Negative.   Genitourinary: Negative.   Musculoskeletal: Negative.   Neurological: Positive for speech difficulty.  Negative for dizziness, tremors, seizures, syncope, facial asymmetry, light-headedness, numbness and headaches.  Hematological: Negative for adenopathy. Does not bruise/bleed easily.  Psychiatric/Behavioral: Negative.        Objective:   Physical Exam Vitals:   09/19/19 0958  BP: (!) 149/95  Pulse: 87  SpO2: 100%  Weight: 128 lb 12.8 oz (58.4 kg)  Height: 5\' 3"  (1.6 m)    Gen: Pleasant, well-nourished, in no distress,  normal affect  ENT: No lesions,  mouth clear,  oropharynx clear, no postnasal drip  Neck: No JVD, no TMG, no carotid bruits  Lungs: No use of accessory muscles, no dullness to percussion, clear without rales or rhonchi  Cardiovascular: RRR, heart sounds normal, no murmur or gallops, no peripheral edema  Abdomen: soft and NT, no HSM,  BS normal  Musculoskeletal: No deformities, no cyanosis or clubbing  Neuro: alert, non focal  Skin: Warm, no lesions or rashes BMP Latest Ref Rng & Units 06/27/2019 06/20/2019 06/19/2019  Glucose 65 - 99 mg/dL 147(H) 121(H) 125(H)  BUN 6 - 20 mg/dL 32(H) 27(H) 25(H)  Creatinine 0.76 - 1.27 mg/dL 2.41(H) 1.92(H) 2.24(H)  BUN/Creat Ratio 9 - 20 13 - -  Sodium 134 - 144 mmol/L 138 140 138  Potassium 3.5 - 5.2 mmol/L 4.6 3.5 3.7  Chloride 96 - 106 mmol/L 103 111 109  CO2 20 - 29 mmol/L 21 22 22   Calcium 8.7 - 10.2 mg/dL 8.8 8.2(L) 8.2(L)   CBC Latest Ref Rng & Units 06/18/2019 06/16/2019 06/15/2019  WBC 4.0 - 10.5 K/uL 14.1(H) 13.9(H) 17.1(H)  Hemoglobin 13.0 - 17.0 g/dL 12.5(L) 13.2 14.1  Hematocrit 39.0 - 52.0 % 35.9(L) 38.9(L) 40.8  Platelets 150 - 400 K/uL 239 272 257       Assessment & Plan:  I personally reviewed all images and lab data in the [facility] system as well as any outside material available during this office visit and agree with the  radiology impressions.   HTN (hypertension) Hypertension under improved control will continue amlodipine 10 mg daily, clonidine 0.2 mg twice daily, along with Coreg 25 mg twice daily  and have discontinued hydrochlorthiazide  No other recommendations from nephrology has been made on hypertension control and we will hold on current medication profile  Diabetes mellitus type I ([place]) Diabetes type 1 under improved control  We will continue regular insulin 4 units 3 times daily before meals and can increase by 1 unit each dose of glucose close is greater than 180  Patient will also continue NPH 15 units twice daily and we will connect the patient with our licensed clinical pharmacist for further diabetic follow-up  Hyperlipidemia due to type 1 diabetes mellitus ([place]) Atorvastatin will be continued  CKD (chronic kidney disease), stage III ([place]) Chronic kidney disease stable at this time we will hold off on lab studies today  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 -     atorvastatin (LIPITOR) 40 MG tablet; Take 1 tablet (40 mg total) by  mouth daily at 6 PM. -     cloNIDine  (CATAPRES) 0.2 MG tablet; Take one tablet twice daily  Flu vaccine was given at this visit

## 2019-09-19 ENCOUNTER — Other Ambulatory Visit: Payer: Self-pay

## 2019-09-19 ENCOUNTER — Ambulatory Visit: Payer: Medicaid Other | Attending: Critical Care Medicine | Admitting: Critical Care Medicine

## 2019-09-19 ENCOUNTER — Encounter: Payer: Self-pay | Admitting: Critical Care Medicine

## 2019-09-19 VITALS — BP 149/95 | HR 87 | Ht 63.0 in | Wt 128.8 lb

## 2019-09-19 DIAGNOSIS — F17210 Nicotine dependence, cigarettes, uncomplicated: Secondary | ICD-10-CM

## 2019-09-19 DIAGNOSIS — I69320 Aphasia following cerebral infarction: Secondary | ICD-10-CM

## 2019-09-19 DIAGNOSIS — R29818 Other symptoms and signs involving the nervous system: Secondary | ICD-10-CM

## 2019-09-19 DIAGNOSIS — E1022 Type 1 diabetes mellitus with diabetic chronic kidney disease: Secondary | ICD-10-CM

## 2019-09-19 DIAGNOSIS — I10 Essential (primary) hypertension: Secondary | ICD-10-CM | POA: Diagnosis not present

## 2019-09-19 DIAGNOSIS — I639 Cerebral infarction, unspecified: Secondary | ICD-10-CM

## 2019-09-19 DIAGNOSIS — N183 Chronic kidney disease, stage 3 unspecified: Secondary | ICD-10-CM

## 2019-09-19 DIAGNOSIS — F17200 Nicotine dependence, unspecified, uncomplicated: Secondary | ICD-10-CM

## 2019-09-19 DIAGNOSIS — E785 Hyperlipidemia, unspecified: Secondary | ICD-10-CM

## 2019-09-19 DIAGNOSIS — Z8673 Personal history of transient ischemic attack (TIA), and cerebral infarction without residual deficits: Secondary | ICD-10-CM

## 2019-09-19 DIAGNOSIS — E1069 Type 1 diabetes mellitus with other specified complication: Secondary | ICD-10-CM

## 2019-09-19 HISTORY — DX: Aphasia following cerebral infarction: I69.320

## 2019-09-19 MED ORDER — CLONIDINE HCL 0.2 MG PO TABS: ORAL_TABLET | ORAL | 1 refills | Status: DC

## 2019-09-19 MED ORDER — CARVEDILOL 25 MG PO TABS: 25.0000 mg | ORAL_TABLET | Freq: Two times a day (BID) | ORAL | 1 refills | Status: DC

## 2019-09-19 MED ORDER — ATORVASTATIN CALCIUM 40 MG PO TABS: 40.0000 mg | ORAL_TABLET | Freq: Every day | ORAL | 1 refills | Status: DC

## 2019-09-19 NOTE — Patient Instructions (Addendum)
Refills on atorvastatin and Coreg were sent to your local pharmacy  No change in medications  A flu vaccine was given  Please obtain an eye exam for your diabetes  We will establish a visit with our clinical pharmacist Lurena Joiner for your diabetes to go over your insulin regimen please bring your glucose meter with you when you make this appointment  Return to see Dr. Joya Gaskins in 3 months

## 2019-09-19 NOTE — Assessment & Plan Note (Signed)
Combined receptive and expressive aphasia slowly improving is a late effect of cerebrovascular accident  The patient was encouraged to make another appointment with speech therapy

## 2019-09-19 NOTE — Assessment & Plan Note (Signed)
Atorvastatin will be continued

## 2019-09-19 NOTE — Assessment & Plan Note (Signed)
Chronic kidney disease stable at this time we will hold off on lab studies today

## 2019-09-19 NOTE — Assessment & Plan Note (Signed)
Additional smoking use counseling was given to the patient at this visit

## 2019-09-19 NOTE — Assessment & Plan Note (Signed)
Diabetes type 1 under improved control  We will continue regular insulin 4 units 3 times daily before meals and can increase by 1 unit each dose of glucose close is greater than 180  Patient will also continue NPH 15 units twice daily and we will connect the patient with our licensed clinical pharmacist for further diabetic follow-up

## 2019-09-19 NOTE — Assessment & Plan Note (Addendum)
Hypertension under improved control will continue amlodipine 10 mg daily, clonidine 0.2 mg twice daily, along with Coreg 25 mg twice daily and have discontinued hydrochlorthiazide  No other recommendations from nephrology has been made on hypertension control and we will hold on current medication profile

## 2019-09-23 ENCOUNTER — Inpatient Hospital Stay (HOSPITAL_COMMUNITY)
Admission: EM | Admit: 2019-09-23 | Discharge: 2019-09-26 | DRG: 637 | Disposition: A | Discharge status: Home / Self Care | Payer: Medicaid Other | Attending: Family Medicine | Admitting: Family Medicine

## 2019-09-23 ENCOUNTER — Emergency Department (HOSPITAL_COMMUNITY): Payer: Medicaid Other

## 2019-09-23 ENCOUNTER — Ambulatory Visit: Payer: Self-pay

## 2019-09-23 DIAGNOSIS — N183 Chronic kidney disease, stage 3 (moderate): Secondary | ICD-10-CM | POA: Diagnosis present

## 2019-09-23 DIAGNOSIS — R112 Nausea with vomiting, unspecified: Secondary | ICD-10-CM | POA: Diagnosis not present

## 2019-09-23 DIAGNOSIS — Z7982 Long term (current) use of aspirin: Secondary | ICD-10-CM

## 2019-09-23 DIAGNOSIS — R079 Chest pain, unspecified: Secondary | ICD-10-CM | POA: Diagnosis not present

## 2019-09-23 DIAGNOSIS — E10649 Type 1 diabetes mellitus with hypoglycemia without coma: Secondary | ICD-10-CM | POA: Diagnosis not present

## 2019-09-23 DIAGNOSIS — G9340 Encephalopathy, unspecified: Secondary | ICD-10-CM

## 2019-09-23 DIAGNOSIS — N179 Acute kidney failure, unspecified: Secondary | ICD-10-CM | POA: Diagnosis not present

## 2019-09-23 DIAGNOSIS — R1111 Vomiting without nausea: Secondary | ICD-10-CM | POA: Diagnosis not present

## 2019-09-23 DIAGNOSIS — F1290 Cannabis use, unspecified, uncomplicated: Secondary | ICD-10-CM | POA: Diagnosis present

## 2019-09-23 DIAGNOSIS — E876 Hypokalemia: Secondary | ICD-10-CM | POA: Diagnosis present

## 2019-09-23 DIAGNOSIS — G9349 Other encephalopathy: Secondary | ICD-10-CM | POA: Diagnosis present

## 2019-09-23 DIAGNOSIS — F17210 Nicotine dependence, cigarettes, uncomplicated: Secondary | ICD-10-CM | POA: Diagnosis present

## 2019-09-23 DIAGNOSIS — Z7902 Long term (current) use of antithrombotics/antiplatelets: Secondary | ICD-10-CM

## 2019-09-23 DIAGNOSIS — E1110 Type 2 diabetes mellitus with ketoacidosis without coma: Secondary | ICD-10-CM | POA: Diagnosis present

## 2019-09-23 DIAGNOSIS — Z794 Long term (current) use of insulin: Secondary | ICD-10-CM

## 2019-09-23 DIAGNOSIS — E873 Alkalosis: Secondary | ICD-10-CM | POA: Diagnosis present

## 2019-09-23 DIAGNOSIS — I129 Hypertensive chronic kidney disease with stage 1 through stage 4 chronic kidney disease, or unspecified chronic kidney disease: Secondary | ICD-10-CM | POA: Diagnosis present

## 2019-09-23 DIAGNOSIS — K828 Other specified diseases of gallbladder: Secondary | ICD-10-CM | POA: Diagnosis present

## 2019-09-23 DIAGNOSIS — Z781 Physical restraint status: Secondary | ICD-10-CM | POA: Diagnosis not present

## 2019-09-23 DIAGNOSIS — E785 Hyperlipidemia, unspecified: Secondary | ICD-10-CM | POA: Diagnosis present

## 2019-09-23 DIAGNOSIS — Z8673 Personal history of transient ischemic attack (TIA), and cerebral infarction without residual deficits: Secondary | ICD-10-CM

## 2019-09-23 DIAGNOSIS — D72829 Elevated white blood cell count, unspecified: Secondary | ICD-10-CM | POA: Diagnosis present

## 2019-09-23 DIAGNOSIS — R000 Tachycardia, unspecified: Secondary | ICD-10-CM | POA: Diagnosis not present

## 2019-09-23 DIAGNOSIS — R509 Fever, unspecified: Secondary | ICD-10-CM | POA: Diagnosis not present

## 2019-09-23 DIAGNOSIS — Z20828 Contact with and (suspected) exposure to other viral communicable diseases: Secondary | ICD-10-CM | POA: Diagnosis present

## 2019-09-23 DIAGNOSIS — R29704 NIHSS score 4: Secondary | ICD-10-CM | POA: Diagnosis present

## 2019-09-23 DIAGNOSIS — E1010 Type 1 diabetes mellitus with ketoacidosis without coma: Principal | ICD-10-CM | POA: Diagnosis present

## 2019-09-23 DIAGNOSIS — I63441 Cerebral infarction due to embolism of right cerebellar artery: Secondary | ICD-10-CM | POA: Diagnosis not present

## 2019-09-23 DIAGNOSIS — R4182 Altered mental status, unspecified: Secondary | ICD-10-CM | POA: Insufficient documentation

## 2019-09-23 DIAGNOSIS — E1022 Type 1 diabetes mellitus with diabetic chronic kidney disease: Secondary | ICD-10-CM | POA: Diagnosis present

## 2019-09-23 DIAGNOSIS — I639 Cerebral infarction, unspecified: Secondary | ICD-10-CM | POA: Diagnosis not present

## 2019-09-23 DIAGNOSIS — R4020 Unspecified coma: Secondary | ICD-10-CM | POA: Diagnosis not present

## 2019-09-23 LAB — COMPREHENSIVE METABOLIC PANEL
ALT: 25 U/L (ref 0–44)
AST: 28 U/L (ref 15–41)
Albumin: 4.3 g/dL (ref 3.5–5.0)
Alkaline Phosphatase: 122 U/L (ref 38–126)
Anion gap: 24 — ABNORMAL HIGH (ref 5–15)
BUN: 55 mg/dL — ABNORMAL HIGH (ref 6–20)
CO2: 18 mmol/L — ABNORMAL LOW (ref 22–32)
Calcium: 8.8 mg/dL — ABNORMAL LOW (ref 8.9–10.3)
Chloride: 89 mmol/L — ABNORMAL LOW (ref 98–111)
Creatinine, Ser: 4.11 mg/dL — ABNORMAL HIGH (ref 0.61–1.24)
GFR calc Af Amer: 21 mL/min — ABNORMAL LOW (ref >60)
GFR calc non Af Amer: 18 mL/min — ABNORMAL LOW (ref >60)
Glucose, Bld: 532 mg/dL (ref 70–99)
Potassium: 4.2 mmol/L (ref 3.5–5.1)
Sodium: 131 mmol/L — ABNORMAL LOW (ref 135–145)
Total Bilirubin: 1.4 mg/dL — ABNORMAL HIGH (ref 0.3–1.2)
Total Protein: 7.1 g/dL (ref 6.5–8.1)

## 2019-09-23 LAB — CBG MONITORING, ED
Glucose-Capillary: 501 mg/dL (ref 70–99)
Glucose-Capillary: 495 mg/dL — ABNORMAL HIGH (ref 70–99)
Glucose-Capillary: 520 mg/dL (ref 70–99)
Glucose-Capillary: 450 mg/dL — ABNORMAL HIGH (ref 70–99)
Glucose-Capillary: 356 mg/dL — ABNORMAL HIGH (ref 70–99)
Glucose-Capillary: 225 mg/dL — ABNORMAL HIGH (ref 70–99)

## 2019-09-23 LAB — BASIC METABOLIC PANEL
Anion gap: 15 (ref 5–15)
BUN: 56 mg/dL — ABNORMAL HIGH (ref 6–20)
CO2: 22 mmol/L (ref 22–32)
Calcium: 8.4 mg/dL — ABNORMAL LOW (ref 8.9–10.3)
Chloride: 98 mmol/L (ref 98–111)
Creatinine, Ser: 3.92 mg/dL — ABNORMAL HIGH (ref 0.61–1.24)
GFR calc Af Amer: 22 mL/min — ABNORMAL LOW (ref >60)
GFR calc non Af Amer: 19 mL/min — ABNORMAL LOW (ref >60)
Glucose, Bld: 223 mg/dL — ABNORMAL HIGH (ref 70–99)
Potassium: 3.3 mmol/L — ABNORMAL LOW (ref 3.5–5.1)
Sodium: 135 mmol/L (ref 135–145)

## 2019-09-23 LAB — URINALYSIS, ROUTINE W REFLEX MICROSCOPIC
Bilirubin Urine: NEGATIVE
Glucose, UA: >=500 mg/dL — AB
Ketones, ur: 20 mg/dL — AB
Leukocytes,Ua: NEGATIVE
Nitrite: NEGATIVE
Protein, ur: >=300 mg/dL — AB
Specific Gravity, Urine: 1.015 (ref 1.005–1.030)
pH: 5 (ref 5.0–8.0)

## 2019-09-23 LAB — RAPID URINE DRUG SCREEN, HOSP PERFORMED
Amphetamines: NOT DETECTED
Barbiturates: NOT DETECTED
Benzodiazepines: NOT DETECTED
Cocaine: NOT DETECTED
Opiates: NOT DETECTED
Tetrahydrocannabinol: POSITIVE — AB

## 2019-09-23 LAB — CBC WITH DIFFERENTIAL/PLATELET
Abs Immature Granulocytes: 0.22 10*3/uL — ABNORMAL HIGH (ref 0.00–0.07)
Basophils Absolute: 0 10*3/uL (ref 0.0–0.1)
Basophils Relative: 0 %
Eosinophils Absolute: 0 10*3/uL (ref 0.0–0.5)
Eosinophils Relative: 0 %
HCT: 40.4 % (ref 39.0–52.0)
Hemoglobin: 14.4 g/dL (ref 13.0–17.0)
Immature Granulocytes: 1 %
Lymphocytes Relative: 3 %
Lymphs Abs: 1 10*3/uL (ref 0.7–4.0)
MCH: 30.6 pg (ref 26.0–34.0)
MCHC: 35.6 g/dL (ref 30.0–36.0)
MCV: 85.8 fL (ref 80.0–100.0)
Monocytes Absolute: 1.2 10*3/uL — ABNORMAL HIGH (ref 0.1–1.0)
Monocytes Relative: 4 %
Neutro Abs: 27.1 10*3/uL — ABNORMAL HIGH (ref 1.7–7.7)
Neutrophils Relative %: 92 %
Platelets: 296 10*3/uL (ref 150–400)
RBC: 4.71 MIL/uL (ref 4.22–5.81)
RDW: 12.3 % (ref 11.5–15.5)
WBC: 29.6 10*3/uL — ABNORMAL HIGH (ref 4.0–10.5)
nRBC: 0 % (ref 0.0–0.2)

## 2019-09-23 LAB — TSH: TSH: 1.268 u[IU]/mL (ref 0.350–4.500)

## 2019-09-23 LAB — GLUCOSE, CAPILLARY
Glucose-Capillary: 154 mg/dL — ABNORMAL HIGH (ref 70–99)
Glucose-Capillary: 174 mg/dL — ABNORMAL HIGH (ref 70–99)

## 2019-09-23 LAB — LACTIC ACID, PLASMA
Lactic Acid, Venous: 5 mmol/L (ref 0.5–1.9)
Lactic Acid, Venous: 3.2 mmol/L (ref 0.5–1.9)

## 2019-09-23 LAB — POCT I-STAT EG7
Bicarbonate: 19.9 mmol/L — ABNORMAL LOW (ref 20.0–28.0)
Calcium, Ion: 0.95 mmol/L — ABNORMAL LOW (ref 1.15–1.40)
HCT: 43 % (ref 39.0–52.0)
Hemoglobin: 14.6 g/dL (ref 13.0–17.0)
O2 Saturation: 97 %
Potassium: 4.1 mmol/L (ref 3.5–5.1)
Sodium: 128 mmol/L — ABNORMAL LOW (ref 135–145)
TCO2: 21 mmol/L — ABNORMAL LOW (ref 22–32)
pCO2, Ven: 20.6 mmHg — ABNORMAL LOW (ref 44.0–60.0)
pH, Ven: 7.593 — ABNORMAL HIGH (ref 7.250–7.430)
pO2, Ven: 69 mmHg — ABNORMAL HIGH (ref 32.0–45.0)

## 2019-09-23 LAB — SARS CORONAVIRUS 2 BY RT PCR (HOSPITAL ORDER, PERFORMED IN ~~LOC~~ HOSPITAL LAB): SARS Coronavirus 2: NEGATIVE

## 2019-09-23 LAB — ETHANOL: Alcohol, Ethyl (B): <10 mg/dL (ref <10)

## 2019-09-23 MED ORDER — SODIUM CHLORIDE 0.9 % IV BOLUS
2000.0000 mL | Freq: Once | INTRAVENOUS | Status: AC
  Administered 2019-09-23: 2000 mL via INTRAVENOUS

## 2019-09-23 MED ORDER — INSULIN REGULAR(HUMAN) IN NACL 100-0.9 UT/100ML-% IV SOLN
INTRAVENOUS | Status: DC
  Administered 2019-09-23: 18:00:00 4.6 [IU]/h via INTRAVENOUS
  Filled 2019-09-23 (×2): qty 100

## 2019-09-23 MED ORDER — LORAZEPAM 2 MG/ML IJ SOLN
1.0000 mg | Freq: Once | INTRAMUSCULAR | Status: DC
  Filled 2019-09-23: qty 1

## 2019-09-23 MED ORDER — DIPHENHYDRAMINE HCL 50 MG/ML IJ SOLN
25.0000 mg | Freq: Once | INTRAMUSCULAR | Status: AC
  Administered 2019-09-23: 18:00:00 25 mg via INTRAVENOUS
  Filled 2019-09-23: qty 1

## 2019-09-23 MED ORDER — VERAPAMIL HCL 2.5 MG/ML IV SOLN
10.0000 mg | Freq: Once | INTRAVENOUS | Status: AC
  Administered 2019-09-23: 10 mg via INTRAVENOUS
  Filled 2019-09-23: qty 4

## 2019-09-23 MED ORDER — POTASSIUM CHLORIDE 10 MEQ/100ML IV SOLN
10.0000 meq | INTRAVENOUS | Status: AC
  Administered 2019-09-23 (×2): 10 meq via INTRAVENOUS
  Filled 2019-09-23 (×2): qty 100

## 2019-09-23 MED ORDER — DEXTROSE-NACL 5-0.45 % IV SOLN
INTRAVENOUS | Status: DC
  Administered 2019-09-23: 23:00:00 via INTRAVENOUS

## 2019-09-23 MED ORDER — METOCLOPRAMIDE HCL 5 MG/ML IJ SOLN
10.0000 mg | Freq: Once | INTRAMUSCULAR | Status: AC
  Administered 2019-09-23: 10 mg via INTRAVENOUS
  Filled 2019-09-23: qty 2

## 2019-09-23 MED ORDER — DROPERIDOL 2.5 MG/ML IJ SOLN
5.0000 mg | Freq: Once | INTRAMUSCULAR | Status: AC
  Administered 2019-09-23: 5 mg via INTRAVENOUS
  Filled 2019-09-23: qty 2

## 2019-09-23 MED ORDER — SODIUM CHLORIDE 0.9 % IV SOLN
INTRAVENOUS | Status: DC
  Administered 2019-09-23 – 2019-09-24 (×2): via INTRAVENOUS

## 2019-09-23 MED ORDER — HEPARIN SODIUM (PORCINE) 5000 UNIT/ML IJ SOLN
5000.0000 [IU] | Freq: Three times a day (TID) | INTRAMUSCULAR | Status: DC
  Administered 2019-09-23 – 2019-09-26 (×7): 5000 [IU] via SUBCUTANEOUS
  Filled 2019-09-23 (×7): qty 1

## 2019-09-23 MED ORDER — DEXTROSE-NACL 5-0.45 % IV SOLN: INTRAVENOUS | Status: DC

## 2019-09-23 MED ORDER — DIPHENHYDRAMINE HCL 50 MG/ML IJ SOLN
25.0000 mg | Freq: Once | INTRAMUSCULAR | Status: AC
  Administered 2019-09-23: 25 mg via INTRAVENOUS
  Filled 2019-09-23: qty 1

## 2019-09-23 MED ORDER — ACETAMINOPHEN 650 MG RE SUPP: 650.0000 mg | Freq: Once | RECTAL | Status: DC

## 2019-09-23 MED ORDER — ACETAMINOPHEN 325 MG PO TABS
650.0000 mg | ORAL_TABLET | Freq: Once | ORAL | Status: AC
  Administered 2019-09-23: 16:00:00 650 mg via ORAL
  Filled 2019-09-23: qty 2

## 2019-09-23 NOTE — ED Notes (Signed)
ED TO INPATIENT HANDOFF REPORT  ED Nurse Name and Phone #: Lovell Sheehan [phone]  [facility] Name/Age/Gender [place] 33 y.o. male Room/Bed: 028C/028C  Code Status   Code Status: Full Code  Home/SNF/Other Home Patient DOx4  Is this baseline? No   Triage Complete: Triage complete  Chief Complaint hyperglycemia   Triage Note Pt arrives via [facility]  Pt endorses N/V since Wed. Pt has not eaten anything since Wed.  Pt has received 20u of insulin this morning and 4mg  of Zofran with EMS.  Pt denies pain and is A&Ox4   Allergies No Known Allergies  Level of Care/Admitting Diagnosis ED Disposition    ED Disposition Condition Comment   Admit  Hospital Area: [place] [[id number]]  Level of Care: Progressive [102]  Covid Evaluation: Asymptomatic Screening Protocol (No Symptoms)  Diagnosis: DKA (diabetic ketoacidoses) [facility]) [id number]  Admitting Physician: Orene Desanctis [id number]  Attending Physician: Orene Desanctis [id number]  Estimated length of stay: past midnight tomorrow  Certification:: I certify this patient will need inpatient services for at least 2 midnights  PT Class (Do Not Modify): Inpatient [101]  PT Acc Code (Do Not Modify): Private [1]       B Medical/Surgery History Past Medical History:  Diagnosis Date  . AKI (acute kidney injury) ([place])   . Cerebral infarction ([place])   . Neurologic deficit due to acute ischemic cerebrovascular accident (CVA) ([place]) 06/13/2019  . Supraventricular tachycardia ([place])   . Type 1 diabetes [facility])    Past Surgical History:  Procedure Laterality Date  . BUBBLE STUDY  06/20/2019   Procedure: BUBBLE STUDY;  Surgeon: Fay Records, MD;  Location: [facility];  Service: Cardiovascular;;  . TEE WITHOUT CARDIOVERSION N/A 06/20/2019   Procedure: TRANSESOPHAGEAL ECHOCARDIOGRAM (TEE);  Surgeon: Fay Records, MD;  Location: [facility] ENDOSCOPY;  Service: Cardiovascular;  Laterality: N/A;     A IV Location/Drains/Wounds Patient  Lines/Drains/Airways Status   Active Line/Drains/Airways    Name:   Placement date:   Placement time:   Site:   Days:   Peripheral IV 09/23/19 Left Antecubital   09/23/19    1957    Antecubital   less than 1          Intake/Output Last 24 hours  Intake/Output Summary (Last 24 hours) at 09/23/2019 2145 Last data filed at 09/23/2019 1759 Gross per 24 hour  Intake 2000 ml  Output -  Net 2000 ml    Labs/Imaging Results for orders placed or performed during the hospital encounter of 09/23/19 (from the past 48 hour(s))  CBG monitoring, ED     Status: Abnormal   Collection Time: 09/23/19  3:37 PM  Result Value Ref Range   Glucose-Capillary 501 (HH) 70 - 99 mg/dL   Comment 1 Notify RN   Lactic acid, plasma     Status: Abnormal   Collection Time: 09/23/19  4:00 PM  Result Value Ref Range   Lactic Acid, Venous 5.0 (HH) 0.5 - 1.9 mmol/L    Comment: CRITICAL RESULT CALLED TO, READ BACK BY AND VERIFIED WITH:  Brandun Pinn,CB RN 09/23/2019 1640 JORDANS Performed at [facility], [address] [place], [place] [address]   Comprehensive metabolic panel     Status: Abnormal   Collection Time: 09/23/19  4:01 PM  Result Value Ref Range   Sodium 131 (L) 135 - 145 mmol/L   Potassium 4.2 3.5 - 5.1 mmol/L   Chloride 89 (L) 98 - 111 mmol/L   CO2 18 (L)  22 - 32 mmol/L   Glucose, Bld 532 (HH) 70 - 99 mg/dL    Comment: CRITICAL RESULT CALLED TO, READ BACK BY AND VERIFIED WITH: Konner Warrior,CB RN 09/23/2019 1654 JORDANS    BUN 55 (H) 6 - 20 mg/dL   Creatinine, Ser 4.11 (H) 0.61 - 1.24 mg/dL   Calcium 8.8 (L) 8.9 - 10.3 mg/dL   Total Protein 7.1 6.5 - 8.1 g/dL   Albumin 4.3 3.5 - 5.0 g/dL   AST 28 15 - 41 U/L   ALT 25 0 - 44 U/L   Alkaline Phosphatase 122 38 - 126 U/L   Total Bilirubin 1.4 (H) 0.3 - 1.2 mg/dL   GFR calc non Af Amer 18 (L) >60 mL/min   GFR calc Af Amer 21 (L) >60 mL/min   Anion gap 24 (H) 5 - 15    Comment: RESULT CHECKED Performed at [facility] [address], [place], [place] [address]   CBC with Differential     Status: Abnormal   Collection Time: 09/23/19  4:01 PM  Result Value Ref Range   WBC 29.6 (H) 4.0 - 10.5 K/uL   RBC 4.71 4.22 - 5.81 MIL/uL   Hemoglobin 14.4 13.0 - 17.0 g/dL   HCT 40.4 39.0 - 52.0 %   MCV 85.8 80.0 - 100.0 fL   MCH 30.6 26.0 - 34.0 pg   MCHC 35.6 30.0 - 36.0 g/dL   RDW 12.3 11.5 - 15.5 %   Platelets 296 150 - 400 K/uL   nRBC 0.0 0.0 - 0.2 %   Neutrophils Relative % 92 %   Neutro Abs 27.1 (H) 1.7 - 7.7 K/uL   Lymphocytes Relative 3 %   Lymphs Abs 1.0 0.7 - 4.0 K/uL   Monocytes Relative 4 %   Monocytes Absolute 1.2 (H) 0.1 - 1.0 K/uL   Eosinophils Relative 0 %   Eosinophils Absolute 0.0 0.0 - 0.5 K/uL   Basophils Relative 0 %   Basophils Absolute 0.0 0.0 - 0.1 K/uL   Immature Granulocytes 1 %   Abs Immature Granulocytes 0.22 (H) 0.00 - 0.07 K/uL    Comment: Performed at [facility] [address] [place], [place] [address]  TSH     Status: None   Collection Time: 09/23/19  4:01 PM  Result Value Ref Range   TSH 1.268 0.350 - 4.500 uIU/mL    Comment: Performed by a 3rd Generation assay with a functional sensitivity of <=0.01 uIU/mL. Performed at [facility], [place] [address] , [place] [address]   POCT I-Stat EG7     Status: Abnormal   Collection Time: 09/23/19  4:09 PM  Result Value Ref Range   pH, Ven 7.593 (H) 7.250 - 7.430   pCO2, Ven 20.6 (L) 44.0 - 60.0 mmHg   pO2, Ven 69.0 (H) 32.0 - 45.0 mmHg   Bicarbonate 19.9 (L) 20.0 - 28.0 mmol/L   TCO2 21 (L) 22 - 32 mmol/L   O2 Saturation 97.0 %   Sodium 128 (L) 135 - 145 mmol/L   Potassium 4.1 3.5 - 5.1 mmol/L   Calcium, Ion 0.95 (L) 1.15 - 1.40 mmol/L   HCT 43.0 39.0 - 52.0 %   Hemoglobin 14.6 13.0 - 17.0 g/dL   Patient temperature HIDE    Sample type VENOUS   CBG monitoring, ED     Status: Abnormal   Collection Time: 09/23/19  5:04 PM  Result Value Ref Range   Glucose-Capillary 495 (H)  70 - 99 mg/dL  SARS Coronavirus 2  [facility] order, Performed in [facility] hospital lab) Nasopharyngeal Nasopharyngeal Swab     Status: None   Collection Time: 09/23/19  5:31 PM   Specimen: Nasopharyngeal Swab  Result Value Ref Range   SARS Coronavirus 2 NEGATIVE NEGATIVE    Comment: (NOTE) If result is NEGATIVE SARS-CoV-2 target nucleic acids are NOT DETECTED. The SARS-CoV-2 RNA is generally detectable in upper and lower  respiratory specimens during the acute phase of infection. The lowest  concentration of SARS-CoV-2 viral copies this assay can detect is 250  copies / mL. A negative result does not preclude SARS-CoV-2 infection  and should not be used as the sole basis for treatment or other  patient management decisions.  A negative result may occur with  improper specimen collection / handling, submission of specimen other  than nasopharyngeal swab, presence of viral mutation(s) within the  areas targeted by this assay, and inadequate number of viral copies  (<250 copies / mL). A negative result must be combined with clinical  observations, patient history, and epidemiological information. If result is POSITIVE SARS-CoV-2 target nucleic acids are DETECTED. The SARS-CoV-2 RNA is generally detectable in upper and lower  respiratory specimens dur ing the acute phase of infection.  Positive  results are indicative of active infection with SARS-CoV-2.  Clinical  correlation with patient history and other diagnostic information is  necessary to determine patient infection status.  Positive results do  not rule out bacterial infection or co-infection with other viruses. If result is PRESUMPTIVE POSTIVE SARS-CoV-2 nucleic acids MAY BE PRESENT.   A presumptive positive result was obtained on the submitted specimen  and confirmed on repeat testing.  While 2019 novel coronavirus  (SARS-CoV-2) nucleic acids may be present in the submitted sample  additional confirmatory testing may be necessary for epidemiological  and /  or clinical management purposes  to differentiate between  SARS-CoV-2 and other Sarbecovirus currently known to infect humans.  If clinically indicated additional testing with an alternate test  methodology [phone]) is advised. The SARS-CoV-2 RNA is generally  detectable in upper and lower respiratory sp ecimens during the acute  phase of infection. The expected result is Negative. Fact Sheet for Patients:  StrictlyIdeas.no Fact Sheet for Healthcare Providers: BankingDealers.co.za This test is not yet approved or cleared by the [place] FDA and has been authorized for detection and/or diagnosis of SARS-CoV-2 by FDA under an Emergency Use Authorization (EUA).  This EUA will remain in effect (meaning this test can be used) for the duration of the COVID-19 declaration under Section 564(b)(1) of the Act, 21 U.S.C. section 360bbb-3(b)(1), unless the authorization is terminated or revoked sooner. Performed at [facility], [place] [address], [place], [place] [address]   CBG monitoring, ED     Status: Abnormal   Collection Time: 09/23/19  5:46 PM  Result Value Ref Range   Glucose-Capillary 520 (HH) 70 - 99 mg/dL   Comment 1 Document in Chart   Lactic acid, plasma     Status: Abnormal   Collection Time: 09/23/19  7:20 PM  Result Value Ref Range   Lactic Acid, Venous 3.2 (HH) 0.5 - 1.9 mmol/L    Comment: CRITICAL RESULT CALLED TO, READ BACK BY AND VERIFIED WITHHurshel Party [id number] 09/23/2019 D BRADLEY Performed at [facility], [place] [address] [place], [place] [address]   CBG monitoring, ED     Status: Abnormal   Collection Time: 09/23/19  7:23 PM  Result Value Ref Range   Glucose-Capillary 450 (H) 70 - 99 mg/dL  Urinalysis, Routine w reflex microscopic     Status: Abnormal   Collection Time: 09/23/19  8:26 PM  Result Value Ref Range   Color, Urine YELLOW YELLOW   APPearance HAZY (A) CLEAR   Specific Gravity, Urine 1.015  1.005 - 1.030   pH 5.0 5.0 - 8.0   Glucose, UA >=500 (A) NEGATIVE mg/dL   Hgb urine dipstick MODERATE (A) NEGATIVE   Bilirubin Urine NEGATIVE NEGATIVE   Ketones, ur 20 (A) NEGATIVE mg/dL   Protein, ur >=300 (A) NEGATIVE mg/dL   Nitrite NEGATIVE NEGATIVE   Leukocytes,Ua NEGATIVE NEGATIVE   RBC / HPF 0-5 0 - 5 RBC/hpf   WBC, UA 11-20 0 - 5 WBC/hpf   Bacteria, UA RARE (A) NONE SEEN   Squamous Epithelial / LPF 0-5 0 - 5   WBC Clumps PRESENT     Comment: Performed at [facility], [address], [place], [place] [address]  CBG monitoring, ED     Status: Abnormal   Collection Time: 09/23/19  8:37 PM  Result Value Ref Range   Glucose-Capillary 356 (H) 70 - 99 mg/dL  Rapid urine drug screen (hospital performed)     Status: Abnormal   Collection Time: 09/23/19  8:56 PM  Result Value Ref Range   Opiates NONE DETECTED NONE DETECTED   Cocaine NONE DETECTED NONE DETECTED   Benzodiazepines NONE DETECTED NONE DETECTED   Amphetamines NONE DETECTED NONE DETECTED   Tetrahydrocannabinol POSITIVE (A) NONE DETECTED   Barbiturates NONE DETECTED NONE DETECTED    Comment: (NOTE) DRUG SCREEN FOR MEDICAL PURPOSES ONLY.  IF CONFIRMATION IS NEEDED FOR ANY PURPOSE, NOTIFY LAB WITHIN 5 DAYS. LOWEST DETECTABLE LIMITS FOR URINE DRUG SCREEN Drug Class                     Cutoff (ng/mL) Amphetamine and metabolites    1000 Barbiturate and metabolites    200 Benzodiazepine                 [id number] Tricyclics and metabolites     300 Opiates and metabolites        300 Cocaine and metabolites        300 THC                            50 Performed at [facility], [place] [address] [place], [place] [address]   CBG monitoring, ED     Status: Abnormal   Collection Time: 09/23/19  9:40 PM  Result Value Ref Range   Glucose-Capillary 225 (H) 70 - 99 mg/dL   Ct Abdomen Pelvis Wo Contrast  Result Date: 09/23/2019 CLINICAL DATA:  Nausea and vomiting since Wednesday. EXAM: CT ABDOMEN AND PELVIS WITHOUT  CONTRAST TECHNIQUE: Multidetector CT imaging of the abdomen and pelvis was performed following the standard protocol without IV contrast. COMPARISON:  None. FINDINGS: Lower chest: The lung bases are clear of an acute process. No pleural effusions. The heart is normal in size. No pericardial effusion. Mild bilateral gynecomastia noted. Hepatobiliary: No focal hepatic lesions are identified without contrast. The gallbladder contains high attenuation material which could be sludge or stones. I do not see any obvious findings for acute cholecystitis but right upper quadrant ultrasound examination may be helpful for further evaluation. No common bile duct dilatation. Pancreas: No mass, inflammation or ductal  dilatation. Spleen: Normal size.  No focal lesions. Adrenals/Urinary Tract: The adrenal glands and kidneys are unremarkable. No renal, ureteral or bladder calculi or obvious mass. Stomach/Bowel: The stomach, duodenum, small bowel and colon are grossly unremarkable without oral contrast. No acute inflammatory changes, mass lesions or obstructive findings. The appendix is opacified possibly related to a prior contrasted study. No inflammation to suggest appendicitis. Vascular/Lymphatic: The aorta is normal in caliber. No atheroscerlotic calcifications. No mesenteric of retroperitoneal mass or adenopathy. Small scattered lymph nodes are noted. Reproductive: The prostate gland and seminal vesicles are unremarkable. Other: No pelvic mass or adenopathy. No free pelvic fluid collections. No inguinal mass or adenopathy. No abdominal wall hernia or subcutaneous lesions. Musculoskeletal: No significant bony findings. IMPRESSION: 1. No acute abdominal/pelvic findings, mass lesions or adenopathy. 2. No renal, ureteral or bladder calculi or mass. 3. High attenuation material in the gallbladder could be sludge or stones. No obvious CT findings for acute cholecystitis. Right upper quadrant ultrasound examination may be helpful for  further evaluation. Electronically Signed   By: Marijo Sanes M.D.   On: 09/23/2019 18:51   Ct Head Wo Contrast  Result Date: 09/23/2019 CLINICAL DATA:  Altered level of consciousness. EXAM: CT HEAD WITHOUT CONTRAST TECHNIQUE: Contiguous axial images were obtained from the base of the skull through the vertex without intravenous contrast. COMPARISON:  June 12, 2019 FINDINGS: Brain: No subdural, epidural, or subarachnoid hemorrhage 3 tiny rounded regions of low attenuation are seen in the cerebellum with 2 on the right and 1 on the left as seen on series 3, images 12 and 13. These findings were not seen previously. The basal cisterns are patent. This the brainstem is normal. The ventricles are stable. The left a separate all and temporal lobe infarct seen on the previous study are again noted with decreasing attenuation/encephalomalacia. No other sites of interval ischemia or infarct. No mass effect or midline shift. Vascular: No hyperdense vessel or unexpected calcification. Skull: Normal. Negative for fracture or focal lesion. Sinuses/Orbits: No acute finding. Other: None. IMPRESSION: 1. Two or 3 rounded/oval regions of low attenuation in the cerebellar hemispheres as described above, not seen on the previous study. These could represent small interval lacunar infarcts. The multiplicity raises the possibility of embolic disease. The findings are otherwise age indeterminate. An MRI could further assess and determine acuity. 2. No other acute abnormalities are noted. Electronically Signed   By: Dorise Bullion III M.D   On: 09/23/2019 18:54   Dg Chest Port 1 View  Result Date: 09/23/2019 CLINICAL DATA:  Hyperglycemia.  Low-grade fever. EXAM: PORTABLE CHEST 1 VIEW COMPARISON:  June 15, 2019 FINDINGS: The heart size and mediastinal contours are within normal limits. Both lungs are clear. The visualized skeletal structures are unremarkable. IMPRESSION: No active disease. Electronically Signed   By: Dorise Bullion III M.D   On: 09/23/2019 16:33    Pending Labs Unresulted Labs (From admission, onward)    Start     Ordered   09/23/19 2024  Ethanol  Once,   STAT     09/23/19 2023   09/23/19 [id number]  Basic metabolic panel  STAT Now then every 4 hours ,   STAT     09/23/19 2022   09/23/19 1550  Culture, blood (routine x 2)  BLOOD CULTURE X 2,   STAT     09/23/19 1550          Vitals/Pain Today's Vitals   09/23/19 2000 09/23/19 2030 09/23/19 2045 09/23/19 2100  BP: Marland Kitchen)  187/115 (!) 178/112 (!) 192/118 (!) 185/112  Pulse: (!) 140 (!) 133 (!) 155 (!) 130  Resp: (!) 23 20 (!) 21 (!) 22  Temp:    99.6 F (37.6 C)  TempSrc:    Oral  SpO2: 99% 100% 100% 100%  Weight:      Height:      PainSc:        Isolation Precautions No active isolations  Medications Medications  dextrose 5 %-0.45 % sodium chloride infusion ( Intravenous Hold 09/23/19 1759)  insulin regular, human (MYXREDLIN) 100 units/ 100 mL infusion (5.9 Units/hr Intravenous Rate/Dose Change 09/23/19 2040)  LORazepam (ATIVAN) injection 1 mg (1 mg Intravenous Not Given 09/23/19 1908)  0.9 %  sodium chloride infusion ( Intravenous New Bag/Given 09/23/19 2040)  dextrose 5 %-0.45 % sodium chloride infusion ( Intravenous Hold 09/23/19 2034)  heparin injection 5,000 Units (has no administration in time range)  potassium chloride 10 mEq in 100 mL IVPB (10 mEq Intravenous New Bag/Given 09/23/19 2139)  verapamil (ISOPTIN) injection 10 mg (10 mg Intravenous Given 09/23/19 1555)  sodium chloride 0.9 % bolus 2,000 mL (0 mLs Intravenous Stopped 09/23/19 1759)  metoCLOPramide (REGLAN) injection 10 mg (10 mg Intravenous Given 09/23/19 1554)  diphenhydrAMINE (BENADRYL) injection 25 mg (25 mg Intravenous Given 09/23/19 1553)  acetaminophen (TYLENOL) tablet 650 mg (650 mg Oral Given 09/23/19 1608)  droperidol (INAPSINE) 2.5 MG/ML injection 5 mg (5 mg Intravenous Given 09/23/19 1742)  diphenhydrAMINE (BENADRYL) injection 25 mg (25 mg Intravenous Given  09/23/19 1741)    Mobility walks     Focused Assessments Neuro Assessment Handoff:  Swallow screen pass? Yes          Neuro Assessment: Exceptions to WDL Neuro Checks:      Last Documented NIHSS Modified Score:   Has TPA been given? No If patient is a Neuro Trauma and patient is going to OR before floor call report to [place] nurse: [phone] or [phone]     R Recommendations: See Admitting Provider Note  Report given to:   Additional Notes: pt in soft restraints x2, pt is disoriented x4 and has successfully removed 4 of our IVs.

## 2019-09-23 NOTE — H&P (Signed)
History and Physical    [facility] UT:[id number] DOB: November 26, 1986 DOA: 09/23/2019  PCP: Elsie Stain, MD  Patient coming from: Home  I have personally briefly reviewed patient's old medical records in [place]  Chief Complaint: AMS  HPI: George [name] is a 33 y.o. male with medical history significant of type 1 diabetes, hypertension, recent CVA in June with combined receptive and expressive aphasia, CKD stage III who presented here with altered mental status and reportedly has been having nausea and persistent vomiting since Wednesday.  Reportedly from EDP patient was initially able to give some history but acutely became more agitated and was pulling out his IV.  He was tachycardic up to 160s and had improvement of heart rate down to 120s with 10 mg of verapamil.  He was also tachypneic but remained on room air.  Patient was restless and agitated at the time my evaluation and per nursing report he had attempted to use his foot to remove his hand restraints.  He repeatedly stated "please help me," but was unable to explain any of his symptoms to me.  He was pan positive and replied yes to every review of systems questions.  Also stating he has pain everywhere.  When asked for his full name and where he was he only stated "[place]."  He was afebrile and hypertensive up to 170s/108. In sinus tachycardia up to 160 and mildly tachypneic on room air.  CBC showed leukocytosis of 29.6 and no anemia.  CMP showed glucose level 532 with bicarb of 18 and anion gap of 24.  pH was mildly alkalotic at 7.59.  Lactic acid of 5 and then 3.2 following IV fluids.  TSH normal.  COVID testing negative.  CT head shows 2 or 3 rounded oval regions of low attenuation in the cerebral hemisphere not seen on previous studies.  Suggest could be small interval lacunar infarcts with the possibility of embolic disease.  Findings were age-indeterminate.  CT abdomen showed no acute findings.  There is gallbladder  sludge or stones but no findings of acute cholecystitis.   Review of Systems:  Unable to obtain due to altered mental status  Past Medical History:  Diagnosis Date   AKI (acute kidney injury) ([place])    Cerebral infarction ([place])    Neurologic deficit due to acute ischemic cerebrovascular accident (CVA) ([place]) 06/13/2019   Supraventricular tachycardia ([place])    Type 1 diabetes ([place])     Past Surgical History:  Procedure Laterality Date   BUBBLE STUDY  06/20/2019   Procedure: BUBBLE STUDY;  Surgeon: Fay Records, MD;  Location: [place];  Service: Cardiovascular;;   TEE WITHOUT CARDIOVERSION N/A 06/20/2019   Procedure: TRANSESOPHAGEAL ECHOCARDIOGRAM (TEE);  Surgeon: Fay Records, MD;  Location[facility];  Service: Cardiovascular;  Laterality: N/A;     reports that he has been smoking cigarettes. He started smoking about 8 years ago. He has been smoking about 0.50 packs per day. He has never used smokeless tobacco. He reports previous alcohol use. He reports previous drug use.  No Known Allergies  No family history on file. Unable to obtain due to altered mental status  Prior to Admission medications   Medication Sig Start Date End Date Taking? Authorizing Provider  acetaminophen (TYLENOL) 325 MG tablet Take 650 mg by mouth every 6 (six) hours as needed for headache.    [provider]  amLODipine (NORVASC) 10 MG tablet Take 1 tablet (10 mg total) by mouth daily. 06/27/19  Elsie Stain, MD  aspirin 325 MG EC tablet Take 1 tablet (325 mg total) by mouth daily. 06/27/19   Elsie Stain, MD  atorvastatin (LIPITOR) 40 MG tablet Take 1 tablet (40 mg total) by mouth daily at 6 PM. 09/19/19   Elsie Stain, MD  carvedilol (COREG) 25 MG tablet Take 1 tablet (25 mg total) by mouth 2 (two) times daily with a meal. 09/19/19 03/17/20  Elsie Stain, MD  cloNIDine (CATAPRES) 0.2 MG tablet Take one tablet twice daily 09/19/19   Elsie Stain, MD  clopidogrel  (PLAVIX) 75 MG tablet Take 1 tablet (75 mg total) by mouth daily. 06/27/19   Elsie Stain, MD  insulin NPH Human (NOVOLIN N) 100 UNIT/ML injection Inject 0.15 mLs (15 Units total) into the skin 2 (two) times daily with a meal. 06/27/19   Elsie Stain, MD  insulin regular (NOVOLIN R) 100 units/mL injection Inject 0.04 mLs (4 Units total) into the skin 3 (three) times daily before meals. May increase by 1 unit at each dose for glucose greater than 180 06/27/19   Elsie Stain, MD  nicotine polacrilex (NICORETTE MINI) 4 MG lozenge Take three times daily to stop smoking 07/18/19   Elsie Stain, MD    Physical Exam: Vitals:   09/23/19 1730 09/23/19 1745 09/23/19 1800 09/23/19 1933  BP: (!) 169/101 (!) 168/98 (!) 163/103   Pulse: (!) 127 (!) 143 (!) 129   Resp: 13 18 15    Temp:    99.9 F (37.7 C)  TempSrc:    Oral  SpO2: 100% 100% 100%   Weight:      Height:        Constitutional: Young unkept appearing male who was placed in soft restraints. Appeared restless and shivering in bed calling out for help Vitals:   09/23/19 1730 09/23/19 1745 09/23/19 1800 09/23/19 1933  BP: (!) 169/101 (!) 168/98 (!) 163/103   Pulse: (!) 127 (!) 143 (!) 129   Resp: 13 18 15    Temp:    99.9 F (37.7 C)  TempSrc:    Oral  SpO2: 100% 100% 100%   Weight:      Height:       Eyes: PERRL, no pinpoint pupils, lids and conjunctivae normal, patient was uncooperative with exam and attempted to keep his eyes closed and rolling his eyes back when I attempted to check his pupils ENMT: Mucous membranes are moist. Posterior pharynx clear of any exudate or lesions.Normal dentition.  Neck: normal, supple, no masses Respiratory: clear to auscultation anteriorly, no wheezing, no crackles. Normal respiratory effort on room air. No accessory muscle use.  Cardiovascular: Regular rate and rhythm, no murmurs / rubs / gallops. No extremity edema. Abdomen: Grunting during palpation of the abdomen, no masses  palpated.  Bowel sounds positive.  Musculoskeletal: no clubbing / cyanosis. No joint deformity upper and lower extremities. Good ROM, no contractures. Normal muscle tone.  Skin: no rashes, lesions, ulcers. No induration Neurologic: Alert but disoriented.  He was uncooperative with exam.  Patient continued to says he was "trying"  when asked to do simple commands and other times would only say "no" when asked to perform a certain task. Also attempted to pull at my hand during examination. Psychiatric: Patient is restless and agitated in bed with soft arm restraints.   Labs on Admission: I have personally reviewed following labs and imaging studies  CBC: Recent Labs  Lab 09/23/19 1601 09/23/19 1609  WBC  29.6*  --   NEUTROABS 27.1*  --   HGB 14.4 14.6  HCT 40.4 43.0  MCV 85.8  --   PLT 296  --    Basic Metabolic Panel: Recent Labs  Lab 09/23/19 1601 09/23/19 1609  NA 131* 128*  K 4.2 4.1  CL 89*  --   CO2 18*  --   GLUCOSE 532*  --   BUN 55*  --   CREATININE 4.11*  --   CALCIUM 8.8*  --    GFR: Estimated Creatinine Clearance: 20.6 mL/min (A) (by C-G formula based on SCr of 4.11 mg/dL (H)). Liver Function Tests: Recent Labs  Lab 09/23/19 1601  AST 28  ALT 25  ALKPHOS 122  BILITOT 1.4*  PROT 7.1  ALBUMIN 4.3   No results for input(s): LIPASE, AMYLASE in the last 168 hours. No results for input(s): AMMONIA in the last 168 hours. Coagulation Profile: No results for input(s): INR, PROTIME in the last 168 hours. Cardiac Enzymes: No results for input(s): CKTOTAL, CKMB, CKMBINDEX, TROPONINI in the last 168 hours. BNP (last 3 results) No results for input(s): PROBNP in the last 8760 hours. HbA1C: No results for input(s): HGBA1C in the last 72 hours. CBG: Recent Labs  Lab 09/23/19 1537 09/23/19 1704 09/23/19 1746 09/23/19 1923  GLUCAP 501* 495* 520* 450*   Lipid Profile: No results for input(s): CHOL, HDL, LDLCALC, TRIG, CHOLHDL, LDLDIRECT in the last 72  hours. Thyroid Function Tests: Recent Labs    09/23/19 1601  TSH 1.268   Anemia Panel: No results for input(s): VITAMINB12, FOLATE, FERRITIN, TIBC, IRON, RETICCTPCT in the last 72 hours. Urine analysis:    Component Value Date/Time   COLORURINE YELLOW 06/13/2019 0423   APPEARANCEUR HAZY (A) 06/13/2019 0423   LABSPEC 1.008 06/13/2019 0423   PHURINE 6.0 06/13/2019 0423   GLUCOSEU 150 (A) 06/13/2019 0423   HGBUR SMALL (A) 06/13/2019 0423   BILIRUBINUR NEGATIVE 06/13/2019 0423   KETONESUR NEGATIVE 06/13/2019 0423   PROTEINUR >=300 (A) 06/13/2019 0423   NITRITE NEGATIVE 06/13/2019 0423   LEUKOCYTESUR NEGATIVE 06/13/2019 0423    Radiological Exams on Admission: Ct Abdomen Pelvis Wo Contrast  Result Date: 09/23/2019 CLINICAL DATA:  Nausea and vomiting since Wednesday. EXAM: CT ABDOMEN AND PELVIS WITHOUT CONTRAST TECHNIQUE: Multidetector CT imaging of the abdomen and pelvis was performed following the standard protocol without IV contrast. COMPARISON:  None. FINDINGS: Lower chest: The lung bases are clear of an acute process. No pleural effusions. The heart is normal in size. No pericardial effusion. Mild bilateral gynecomastia noted. Hepatobiliary: No focal hepatic lesions are identified without contrast. The gallbladder contains high attenuation material which could be sludge or stones. I do not see any obvious findings for acute cholecystitis but right upper quadrant ultrasound examination may be helpful for further evaluation. No common bile duct dilatation. Pancreas: No mass, inflammation or ductal dilatation. Spleen: Normal size.  No focal lesions. Adrenals/Urinary Tract: The adrenal glands and kidneys are unremarkable. No renal, ureteral or bladder calculi or obvious mass. Stomach/Bowel: The stomach, duodenum, small bowel and colon are grossly unremarkable without oral contrast. No acute inflammatory changes, mass lesions or obstructive findings. The appendix is opacified possibly related  to a prior contrasted study. No inflammation to suggest appendicitis. Vascular/Lymphatic: The aorta is normal in caliber. No atheroscerlotic calcifications. No mesenteric of retroperitoneal mass or adenopathy. Small scattered lymph nodes are noted. Reproductive: The prostate gland and seminal vesicles are unremarkable. Other: No pelvic mass or adenopathy. No free pelvic fluid  collections. No inguinal mass or adenopathy. No abdominal wall hernia or subcutaneous lesions. Musculoskeletal: No significant bony findings. IMPRESSION: 1. No acute abdominal/pelvic findings, mass lesions or adenopathy. 2. No renal, ureteral or bladder calculi or mass. 3. High attenuation material in the gallbladder could be sludge or stones. No obvious CT findings for acute cholecystitis. Right upper quadrant ultrasound examination may be helpful for further evaluation. Electronically Signed   By: Marijo Sanes M.D.   On: 09/23/2019 18:51   Ct Head Wo Contrast  Result Date: 09/23/2019 CLINICAL DATA:  Altered level of consciousness. EXAM: CT HEAD WITHOUT CONTRAST TECHNIQUE: Contiguous axial images were obtained from the base of the skull through the vertex without intravenous contrast. COMPARISON:  June 12, 2019 FINDINGS: Brain: No subdural, epidural, or subarachnoid hemorrhage 3 tiny rounded regions of low attenuation are seen in the cerebellum with 2 on the right and 1 on the left as seen on series 3, images 12 and 13. These findings were not seen previously. The basal cisterns are patent. This the brainstem is normal. The ventricles are stable. The left a separate all and temporal lobe infarct seen on the previous study are again noted with decreasing attenuation/encephalomalacia. No other sites of interval ischemia or infarct. No mass effect or midline shift. Vascular: No hyperdense vessel or unexpected calcification. Skull: Normal. Negative for fracture or focal lesion. Sinuses/Orbits: No acute finding. Other: None. IMPRESSION: 1. Two  or 3 rounded/oval regions of low attenuation in the cerebellar hemispheres as described above, not seen on the previous study. These could represent small interval lacunar infarcts. The multiplicity raises the possibility of embolic disease. The findings are otherwise age indeterminate. An MRI could further assess and determine acuity. 2. No other acute abnormalities are noted. Electronically Signed   By: Dorise Bullion III M.D   On: 09/23/2019 18:54   Dg Chest Port 1 View  Result Date: 09/23/2019 CLINICAL DATA:  Hyperglycemia.  Low-grade fever. EXAM: PORTABLE CHEST 1 VIEW COMPARISON:  June 15, 2019 FINDINGS: The heart size and mediastinal contours are within normal limits. Both lungs are clear. The visualized skeletal structures are unremarkable. IMPRESSION: No active disease. Electronically Signed   By: Dorise Bullion III M.D   On: 09/23/2019 16:33    EKG: Independently reviewed.   Assessment/Plan  Diabetic ketoacidosis -Unclear precipitating cause.  He is afebrile at this time but does have a leukocytosis.  No significant findings on CT abdomen. - pH 7.59 - alkalosis in the setting of DKA could be due to GI loss from N/V .  glucose level 532 with bicarb of 18 and anion gap of 24.  - replete potassium - continue IV insulin gtt - continue NS IV fluids - switch to D5-1/2 NS when BG <250  - keep NPO - check BMP q4hrs  - check HbA1C  Altered mental status/delirium -Mentation seems out of proportion to only metabolic derangement.  Will get in and out catheter to obtain a UDS.  Per review of records, he had a marijuana positive screen back in June. -CT head showing new low attenuation in the cerebral hemisphere representing possible small interval lacunar infarct.  MRI is recommended.  I spoke with radiologist Dr. Bernardo Heater and he states that these are nonspecific findings and likely lacunar infarcts due to his history of recent stroke back in June.  I have low suspicion at this time of  infection being the cause of his altered mental status given the lack of fever although he does have elevated  WBC but likely secondary to DKA. -MRI is recommended but suspect patient would not be able to tolerate imaging with his current agitation and delirium.  Will obtain MRI and neurology consult once his metabolic derangements improved.  Acute kidney injury -Creatinine of 4.11 on admission.  Baseline around 2.5. -Likely secondary to hypoperfusion.  Continue IV fluids. -Continue to monitor serial BMP -Avoid nephrotoxic agent  History of CVA -Recommendation as stated above regarding MRI and CT findings -Continue Plavix  Unable to fully complete medication reconciliation secondary to patient's altered mental status  DVT prophylaxis:.Heparin Code Status:Full Family Communication: No family at bedside and patient had altered mental status.   Disposition Plan: Home with at least 2 midnight stays  Consults called:  Admission status: inpatient   Jesson Foskey T Marquelle Musgrave DO [facility]   If 7PM-7AM, please contact night-coverage [email or web address] Password [facility]  09/23/2019, 8:23 PM

## 2019-09-23 NOTE — ED Provider Notes (Signed)
[facility] EMERGENCY DEPARTMENT Provider Note   CSN: [id number] Arrival date & time: 09/23/19  1534     History   Chief Complaint Chief Complaint  Patient presents with   Hyperglycemia    HPI George [name] is a 33 y.o. male.     George [name] is a 33 y.o. male with a history of type 1 diabetes, HTN, CVA, SVT, and AKI, who presents to the emergency department via EMS for evaluation of nausea and vomiting.  With EMS patient reported nausea and vomiting started Wednesday, but patient reports to me that it just started today, he is taken his insulin this morning but has not had anything to eat or drink due to persistent vomiting.  He reports associated periumbilical abdominal pain that radiates up into the chest.  No hematemesis.  He denies diarrhea or constipation.  Denies urinary frequency or dysuria.  Patient denies fevers, low-grade temp of 99.2 noted on arrival.  Patient noted to be very tachycardic on arrival with heart rate in the 160s.  He does have history of SVT, cannot remember the last time he was in this.  Patient denies coughing, rhinorrhea or sore throat.  He denies headache, vision changes, numbness or weakness.  Does have history of recent stroke in June.  Reports he took his insulin like usual this morning, denies taking any drugs or alcohol.  Patient is a poor historian, and history is limited.     Past Medical History:  Diagnosis Date   AKI (acute kidney injury) ([place])    Cerebral infarction ([place])    Neurologic deficit due to acute ischemic cerebrovascular accident (CVA) ([place]) 06/13/2019   Supraventricular tachycardia ([place])    Type 1 diabetes ([place])     Patient Active Problem List   Diagnosis Date Noted   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) 09/19/2019   Tobacco use disorder 06/27/2019   Hyperlipidemia due to type 1 diabetes mellitus ([place]) 06/27/2019   HTN (hypertension)     History of stroke 06/13/2019   Diabetes mellitus type I ([place])    CKD (chronic kidney disease), stage III [facility])     Past Surgical History:  Procedure Laterality Date   BUBBLE STUDY  06/20/2019   Procedure: BUBBLE STUDY;  Surgeon: Fay Records, MD;  Location: [facility];  Service: Cardiovascular;;   TEE WITHOUT CARDIOVERSION N/A 06/20/2019   Procedure: TRANSESOPHAGEAL ECHOCARDIOGRAM (TEE);  Surgeon: Fay Records, MD;  Location: [facility] ENDOSCOPY;  Service: Cardiovascular;  Laterality: N/A;        Home Medications    Prior to Admission medications   Medication Sig Start Date End Date Taking? Authorizing Provider  acetaminophen (TYLENOL) 325 MG tablet Take 650 mg by mouth every 6 (six) hours as needed for headache.    [provider]  amLODipine (NORVASC) 10 MG tablet Take 1 tablet (10 mg total) by mouth daily. 06/27/19   Elsie Stain, MD  aspirin 325 MG EC tablet Take 1 tablet (325 mg total) by mouth daily. 06/27/19   Elsie Stain, MD  atorvastatin (LIPITOR) 40 MG tablet Take 1 tablet (40 mg total) by mouth daily at 6 PM. 09/19/19   Elsie Stain, MD  carvedilol (COREG) 25 MG tablet Take 1 tablet (25 mg total) by mouth 2 (two) times daily with a meal. 09/19/19 03/17/20  Elsie Stain, MD  cloNIDine (CATAPRES) 0.2 MG tablet Take one tablet twice daily 09/19/19   Elsie Stain,  MD  clopidogrel (PLAVIX) 75 MG tablet Take 1 tablet (75 mg total) by mouth daily. 06/27/19   Elsie Stain, MD  insulin NPH Human (NOVOLIN N) 100 UNIT/ML injection Inject 0.15 mLs (15 Units total) into the skin 2 (two) times daily with a meal. 06/27/19   Elsie Stain, MD  insulin regular (NOVOLIN R) 100 units/mL injection Inject 0.04 mLs (4 Units total) into the skin 3 (three) times daily before meals. May increase by 1 unit at each dose for glucose greater than 180 06/27/19   Elsie Stain, MD  nicotine polacrilex (NICORETTE MINI) 4 MG lozenge Take three times daily to stop smoking  07/18/19   Elsie Stain, MD    Family History No family history on file.  Social History Social History   Tobacco Use   Smoking status: Current Every Day Smoker    Packs/day: 0.50    Types: Cigarettes    Start date: 12/29/2010   Smokeless tobacco: Never Used  Substance Use Topics   Alcohol use: Not Currently    Comment: 6 beers/week   Drug use: Not Currently     Allergies   Patient has no known allergies.   Review of Systems Review of Systems  Constitutional: Negative for chills and fever.  HENT: Negative for congestion, rhinorrhea and sore throat.   Respiratory: Negative for cough and shortness of breath.   Cardiovascular: Positive for chest pain.  Gastrointestinal: Positive for abdominal pain, nausea and vomiting. Negative for blood in stool, constipation and diarrhea.  Genitourinary: Negative for dysuria and frequency.  Musculoskeletal: Negative for arthralgias and myalgias.  Skin: Negative for rash.  Neurological: Negative for dizziness, facial asymmetry, speech difficulty, weakness, light-headedness, numbness and headaches.  All other systems reviewed and are negative.    Physical Exam Updated Vital Signs BP (!) 174/108    Pulse (!) 169    Temp 99.2 F (37.3 C) (Oral)    Resp (!) 29    SpO2 100%   Physical Exam Vitals signs and nursing note reviewed.  Constitutional:      General: He is not in acute distress.    Appearance: He is well-developed. He is not diaphoretic.     Comments: Alert, actively vomiting upon my evaluation  HENT:     Head: Normocephalic and atraumatic.     Mouth/Throat:     Comments: Mucous membranes dry, posterior oropharynx clear Eyes:     General:        Right eye: No discharge.        Left eye: No discharge.     Pupils: Pupils are equal, round, and reactive to light.  Neck:     Musculoskeletal: Neck supple.  Cardiovascular:     Rate and Rhythm: Regular rhythm. Tachycardia present.     Heart sounds: Normal heart sounds.      Comments: Tachycardic with heart rate ranging between 150s-160s, regular rhythm Pulmonary:     Effort: Pulmonary effort is normal. No respiratory distress.     Breath sounds: Normal breath sounds. No wheezing or rales.     Comments: Respirations equal and unlabored, patient able to speak in full sentences, lungs clear to auscultation bilaterally Abdominal:     General: Bowel sounds are normal. There is no distension.     Palpations: Abdomen is soft. There is no mass.     Tenderness: There is abdominal tenderness. There is no guarding.     Comments: Abdomen soft, nondistended, bowel sounds present throughout, generalized tenderness to  palpation, most notably in the periumbilical region, no guarding or peritoneal signs.  Musculoskeletal:        General: No deformity.     Right lower leg: No edema.     Left lower leg: No edema.     Comments: Bilateral lower extremities without edema.  Skin:    General: Skin is warm and dry.     Capillary Refill: Capillary refill takes less than 2 seconds.  Neurological:     Mental Status: He is alert.     Coordination: Coordination normal.     Comments: Speech is clear, able to follow commands CN III-XII intact Normal strength in upper and lower extremities bilaterally including dorsiflexion and plantar flexion, strong and equal grip strength Sensation normal to light and sharp touch Moves extremities without ataxia, coordination intact  Psychiatric:        Attention and Perception: He is inattentive (Pt frequently requires redirection).        Mood and Affect: Affect is labile.        Speech: Speech normal.      ED Treatments / Results  Labs (all labs ordered are listed, but only abnormal results are displayed) Labs Reviewed  COMPREHENSIVE METABOLIC PANEL - Abnormal; Notable for the following components:      Result Value   Sodium 131 (*)    Chloride 89 (*)    CO2 18 (*)    Glucose, Bld 532 (*)    BUN 55 (*)    Creatinine, Ser 4.11 (*)     Calcium 8.8 (*)    Total Bilirubin 1.4 (*)    GFR calc non Af Amer 18 (*)    GFR calc Af Amer 21 (*)    Anion gap 24 (*)    All other components within normal limits  LACTIC ACID, PLASMA - Abnormal; Notable for the following components:   Lactic Acid, Venous 5.0 (*)    All other components within normal limits  LACTIC ACID, PLASMA - Abnormal; Notable for the following components:   Lactic Acid, Venous 3.2 (*)    All other components within normal limits  CBC WITH DIFFERENTIAL/PLATELET - Abnormal; Notable for the following components:   WBC 29.6 (*)    Neutro Abs 27.1 (*)    Monocytes Absolute 1.2 (*)    Abs Immature Granulocytes 0.22 (*)    All other components within normal limits  URINALYSIS, ROUTINE W REFLEX MICROSCOPIC - Abnormal; Notable for the following components:   APPearance HAZY (*)    Glucose, UA >=500 (*)    Hgb urine dipstick MODERATE (*)    Ketones, ur 20 (*)    Protein, ur >=300 (*)    Bacteria, UA RARE (*)    All other components within normal limits  RAPID URINE DRUG SCREEN, HOSP PERFORMED - Abnormal; Notable for the following components:   Tetrahydrocannabinol POSITIVE (*)    All other components within normal limits  BASIC METABOLIC PANEL - Abnormal; Notable for the following components:   Potassium 3.3 (*)    Glucose, Bld 223 (*)    BUN 56 (*)    Creatinine, Ser 3.92 (*)    Calcium 8.4 (*)    GFR calc non Af Amer 19 (*)    GFR calc Af Amer 22 (*)    All other components within normal limits  BASIC METABOLIC PANEL - Abnormal; Notable for the following components:   Potassium 3.4 (*)    Glucose, Bld 184 (*)    BUN 51 (*)  Creatinine, Ser 3.77 (*)    Calcium 8.7 (*)    GFR calc non Af Amer 20 (*)    GFR calc Af Amer 23 (*)    All other components within normal limits  GLUCOSE, CAPILLARY - Abnormal; Notable for the following components:   Glucose-Capillary 154 (*)    All other components within normal limits  GLUCOSE, CAPILLARY - Abnormal;  Notable for the following components:   Glucose-Capillary 174 (*)    All other components within normal limits  GLUCOSE, CAPILLARY - Abnormal; Notable for the following components:   Glucose-Capillary 188 (*)    All other components within normal limits  GLUCOSE, CAPILLARY - Abnormal; Notable for the following components:   Glucose-Capillary 276 (*)    All other components within normal limits  CBG MONITORING, ED - Abnormal; Notable for the following components:   Glucose-Capillary 501 (*)    All other components within normal limits  POCT I-STAT EG7 - Abnormal; Notable for the following components:   pH, Ven 7.593 (*)    pCO2, Ven 20.6 (*)    pO2, Ven 69.0 (*)    Bicarbonate 19.9 (*)    TCO2 21 (*)    Sodium 128 (*)    Calcium, Ion 0.95 (*)    All other components within normal limits  CBG MONITORING, ED - Abnormal; Notable for the following components:   Glucose-Capillary 495 (*)    All other components within normal limits  CBG MONITORING, ED - Abnormal; Notable for the following components:   Glucose-Capillary 520 (*)    All other components within normal limits  CBG MONITORING, ED - Abnormal; Notable for the following components:   Glucose-Capillary 450 (*)    All other components within normal limits  CBG MONITORING, ED - Abnormal; Notable for the following components:   Glucose-Capillary 356 (*)    All other components within normal limits  CBG MONITORING, ED - Abnormal; Notable for the following components:   Glucose-Capillary 225 (*)    All other components within normal limits  SARS CORONAVIRUS 2 (HOSPITAL ORDER, [facility])  MRSA PCR SCREENING  CULTURE, BLOOD (ROUTINE X 2)  CULTURE, BLOOD (ROUTINE X 2)  TSH  ETHANOL  BASIC METABOLIC PANEL  BASIC METABOLIC PANEL  BASIC METABOLIC PANEL  BASIC METABOLIC PANEL  BASIC METABOLIC PANEL    EKG EKG Interpretation  Date/Time:  Friday September 23 2019 15:45:36 EDT Ventricular Rate:   163 PR Interval:    QRS Duration: 97 QT Interval:  293 QTC Calculation: 483 R Axis:   -114 Text Interpretation:  Sinus tachycardia Probable inferior infarct, old Probable anterolateral infarct, old Rate faster Otherwise no significant change Confirmed by Deno Etienne [phone]) on 09/23/2019 3:49:07 PM   Radiology Ct Abdomen Pelvis Wo Contrast  Result Date: 09/23/2019 CLINICAL DATA:  Nausea and vomiting since Wednesday. EXAM: CT ABDOMEN AND PELVIS WITHOUT CONTRAST TECHNIQUE: Multidetector CT imaging of the abdomen and pelvis was performed following the standard protocol without IV contrast. COMPARISON:  None. FINDINGS: Lower chest: The lung bases are clear of an acute process. No pleural effusions. The heart is normal in size. No pericardial effusion. Mild bilateral gynecomastia noted. Hepatobiliary: No focal hepatic lesions are identified without contrast. The gallbladder contains high attenuation material which could be sludge or stones. I do not see any obvious findings for acute cholecystitis but right upper quadrant ultrasound examination may be helpful for further evaluation. No common bile duct dilatation. Pancreas: No mass, inflammation or ductal  dilatation. Spleen: Normal size.  No focal lesions. Adrenals/Urinary Tract: The adrenal glands and kidneys are unremarkable. No renal, ureteral or bladder calculi or obvious mass. Stomach/Bowel: The stomach, duodenum, small bowel and colon are grossly unremarkable without oral contrast. No acute inflammatory changes, mass lesions or obstructive findings. The appendix is opacified possibly related to a prior contrasted study. No inflammation to suggest appendicitis. Vascular/Lymphatic: The aorta is normal in caliber. No atheroscerlotic calcifications. No mesenteric of retroperitoneal mass or adenopathy. Small scattered lymph nodes are noted. Reproductive: The prostate gland and seminal vesicles are unremarkable. Other: No pelvic mass or adenopathy. No free  pelvic fluid collections. No inguinal mass or adenopathy. No abdominal wall hernia or subcutaneous lesions. Musculoskeletal: No significant bony findings. IMPRESSION: 1. No acute abdominal/pelvic findings, mass lesions or adenopathy. 2. No renal, ureteral or bladder calculi or mass. 3. High attenuation material in the gallbladder could be sludge or stones. No obvious CT findings for acute cholecystitis. Right upper quadrant ultrasound examination may be helpful for further evaluation. Electronically Signed   By: Marijo Sanes M.D.   On: 09/23/2019 18:51   Ct Head Wo Contrast  Result Date: 09/23/2019 CLINICAL DATA:  Altered level of consciousness. EXAM: CT HEAD WITHOUT CONTRAST TECHNIQUE: Contiguous axial images were obtained from the base of the skull through the vertex without intravenous contrast. COMPARISON:  June 12, 2019 FINDINGS: Brain: No subdural, epidural, or subarachnoid hemorrhage 3 tiny rounded regions of low attenuation are seen in the cerebellum with 2 on the right and 1 on the left as seen on series 3, images 12 and 13. These findings were not seen previously. The basal cisterns are patent. This the brainstem is normal. The ventricles are stable. The left a separate all and temporal lobe infarct seen on the previous study are again noted with decreasing attenuation/encephalomalacia. No other sites of interval ischemia or infarct. No mass effect or midline shift. Vascular: No hyperdense vessel or unexpected calcification. Skull: Normal. Negative for fracture or focal lesion. Sinuses/Orbits: No acute finding. Other: None. IMPRESSION: 1. Two or 3 rounded/oval regions of low attenuation in the cerebellar hemispheres as described above, not seen on the previous study. These could represent small interval lacunar infarcts. The multiplicity raises the possibility of embolic disease. The findings are otherwise age indeterminate. An MRI could further assess and determine acuity. 2. No other acute  abnormalities are noted. Electronically Signed   By: Dorise Bullion III M.D   On: 09/23/2019 18:54   Dg Chest Port 1 View  Result Date: 09/23/2019 CLINICAL DATA:  Hyperglycemia.  Low-grade fever. EXAM: PORTABLE CHEST 1 VIEW COMPARISON:  June 15, 2019 FINDINGS: The heart size and mediastinal contours are within normal limits. Both lungs are clear. The visualized skeletal structures are unremarkable. IMPRESSION: No active disease. Electronically Signed   By: Dorise Bullion III M.D   On: 09/23/2019 16:33    Procedures .Critical Care Performed by: Jacqlyn Larsen, PA-C Authorized by: Jacqlyn Larsen, PA-C   Critical care provider statement:    Critical care time (minutes):  45   Critical care time was exclusive of:  Separately billable procedures and treating other patients   Critical care was necessary to treat or prevent imminent or life-threatening deterioration of the following conditions:  Metabolic crisis (DKA)   Critical care was time spent personally by me on the following activities:  Discussions with consultants, evaluation of patient's response to treatment, examination of patient, ordering and performing treatments and interventions, ordering and review of laboratory  studies, ordering and review of radiographic studies, pulse oximetry, re-evaluation of patient's condition, obtaining history from patient or surrogate and review of old charts   (including critical care time)  Medications Ordered in ED Medications  dextrose 5 %-0.45 % sodium chloride infusion (has no administration in time range)  insulin regular, human (MYXREDLIN) 100 units/ 100 mL infusion (has no administration in time range)  verapamil (ISOPTIN) injection 10 mg (10 mg Intravenous Given 09/23/19 1555)  sodium chloride 0.9 % bolus 2,000 mL (2,000 mLs Intravenous New Bag/Given 09/23/19 1551)  metoCLOPramide (REGLAN) injection 10 mg (10 mg Intravenous Given 09/23/19 1554)  diphenhydrAMINE (BENADRYL) injection 25 mg (25  mg Intravenous Given 09/23/19 1553)  acetaminophen (TYLENOL) tablet 650 mg (650 mg Oral Given 09/23/19 1608)  droperidol (INAPSINE) 2.5 MG/ML injection 5 mg (5 mg Intravenous Given 09/23/19 1742)  diphenhydrAMINE (BENADRYL) injection 25 mg (25 mg Intravenous Given 09/23/19 1741)     Initial Impression / Assessment and Plan / ED Course  I have reviewed the triage vital signs and the nursing notes.  Pertinent labs & imaging results that were available during my care of the patient were reviewed by me and considered in my medical decision making (see chart for details).  33 year old male presents with hyperglycemia, nausea and vomiting, known type I diabetic.  On arrival patient alert, actively vomiting.  Responds appropriately to some questions, but is perseverative over nausea and vomiting.  Reports central abdominal pain radiating into the chest.  Denies fevers.  Denies focal neurologic issues, recent stroke in June.  On arrival patient noted to be extremely tachycardic at 160s, EKG with P waves present, appears to be sinus tachycardia.  Vitals also show hypertension, tachypnea and low-grade temp of 99.5.  Lungs are clear.  Abdomen with generalized tenderness but without focal peritoneal signs.  No focal neurologic deficits present.  CBG 495 on arrival.  Reports he has been taking his insulin appropriately.  Concern for potential DKA, unclear etiology, patient reports medication compliance, denies previous history of DKA.  Patient does have low-grade fever, question potential infection.  Will get basic labs, lactic acid, blood cultures, given tachycardia will also check TSH.  COVID swab ordered.  Venous blood gas ordered.  We will also check urinalysis and UDS.  Chest x-ray ordered as well.  Patient started on 2 L IV fluid bolus.  Case discussed with Dr. Tyrone Nine, given that EKG appears more consistent with sinus tachycardia than SVT will try 10 mg of verapamil IV push to help with tachycardia.  Patient also  given Reglan and Benadryl for nausea.  Tylenol for low-grade fever.  Went in to reassess patient after he received verapamil and patient had become agitated, ripped off all heart monitoring leads, both IVs and was found standing at the sink in his room drinking water out of suction canister.  When I asked the patient what he was doing he stated I do not know, I asked him to get back into bed, a few moments later returned to check on patient again and he was found drinking water out of his open glasses case from the faucet once again.  Patient stating that he is very thirsty.  Patient appears confused, intermittently agitated, but redirectable.  Aside from altered mental status neurologic exam remains intact.  Patient does have history of recent stroke.  I also question potential substance abuse given altered mental status.  This could also be in the setting of DKA or other metabolic derangements.  CT head ordered.  Droperidol given to hopefully help with agitation as well as nausea and vomiting.  Labs show impressive leukocytosis of 29.6 with left shift, patient remains afebrile, question potential infection although no clear source, this could also be acute stress reaction in the setting of DKA.  Metabolic panel shows glucose of 532, sodium 131.  CO2 of 18 and anion gap of 24, this is suggestive of DKA.  Patient also has acute kidney injury, creatinine currently 4.11, previously 2.4, BUN elevated at 55 which could also be contributing to anion gap.  Lactic acid initially elevated at 5.0, I suspect a lot of this is in the setting of dehydration with DKA.  Patient's vitals are improving with fluids, and verapamil, heart rate now improved to 129, no hypotension.  No hypoxia.  Chest x-ray without any evidence of pneumonia.  Will get CT abdomen pelvis but I favor much of patient's lab derangements are related to DKA, will not start antibiotics at this time.  COVID-19 test is negative as well.  Awaiting urinalysis  and UDS.  CT head with 2-3 rounded oval regions of low attenuation in the cerebellar hemispheres, this could be interval lacunar infarcts, potentially embolic.  Recommend follow-up with MRI, which can be done as inpatient.  Since patient has no focal neurologic deficits aside from altered mental status will not emergently consult neurology.  CT abdomen pelvis without acute findings, there is some gallbladder sludge noted but no surrounding signs of acute cholecystitis.  Notified by nursing that patient continues to have periods of agitation, and has pulled out his IV a total of 4 times, will place patient in soft restraints.  Patient will require admission for DKA with AKI as well as for further evaluation of acute encephalopathy, awaiting UDS and urinalysis.  Case discussed with Dr. Flossie Buffy with [facility] hospitalist who will see and admit the patient.  Patient discussed with Dr. Tyrone Nine, who saw patient as well and agrees with plan.   Final Clinical Impressions(s) / ED Diagnoses   Final diagnoses:  Diabetic ketoacidosis without coma associated with type 1 diabetes mellitus ([place])  AKI (acute kidney injury) [facility])  Acute encephalopathy    ED Discharge Orders    None       Jacqlyn Larsen, [place] 09/24/19 0223    Deno Etienne, DO 09/25/19 Valerie Roys

## 2019-09-23 NOTE — ED Notes (Signed)
Initial CBG on arrival Results of 501 reported to [place], [place].

## 2019-09-23 NOTE — ED Notes (Signed)
Attempted to call report for pt x1

## 2019-09-23 NOTE — ED Triage Notes (Signed)
Pt arrives via [facility]  Pt endorses N/V since Wed. Pt has not eaten anything since Wed.  Pt has received 20u of insulin this morning and 4mg  of Zofran with EMS.  Pt denies pain and is A&Ox4

## 2019-09-24 ENCOUNTER — Inpatient Hospital Stay (HOSPITAL_COMMUNITY): Payer: Medicaid Other

## 2019-09-24 DIAGNOSIS — I639 Cerebral infarction, unspecified: Secondary | ICD-10-CM

## 2019-09-24 DIAGNOSIS — G9340 Encephalopathy, unspecified: Secondary | ICD-10-CM

## 2019-09-24 LAB — GLUCOSE, CAPILLARY
Glucose-Capillary: 188 mg/dL — ABNORMAL HIGH (ref 70–99)
Glucose-Capillary: 276 mg/dL — ABNORMAL HIGH (ref 70–99)
Glucose-Capillary: 259 mg/dL — ABNORMAL HIGH (ref 70–99)
Glucose-Capillary: 185 mg/dL — ABNORMAL HIGH (ref 70–99)
Glucose-Capillary: 125 mg/dL — ABNORMAL HIGH (ref 70–99)
Glucose-Capillary: 122 mg/dL — ABNORMAL HIGH (ref 70–99)
Glucose-Capillary: 147 mg/dL — ABNORMAL HIGH (ref 70–99)
Glucose-Capillary: 225 mg/dL — ABNORMAL HIGH (ref 70–99)
Glucose-Capillary: 242 mg/dL — ABNORMAL HIGH (ref 70–99)
Glucose-Capillary: 177 mg/dL — ABNORMAL HIGH (ref 70–99)
Glucose-Capillary: 212 mg/dL — ABNORMAL HIGH (ref 70–99)
Glucose-Capillary: 111 mg/dL — ABNORMAL HIGH (ref 70–99)

## 2019-09-24 LAB — BASIC METABOLIC PANEL
Anion gap: 13 (ref 5–15)
Anion gap: 15 (ref 5–15)
Anion gap: 15 (ref 5–15)
Anion gap: 15 (ref 5–15)
BUN: 47 mg/dL — ABNORMAL HIGH (ref 6–20)
BUN: 47 mg/dL — ABNORMAL HIGH (ref 6–20)
BUN: 49 mg/dL — ABNORMAL HIGH (ref 6–20)
BUN: 51 mg/dL — ABNORMAL HIGH (ref 6–20)
CO2: 22 mmol/L (ref 22–32)
CO2: 22 mmol/L (ref 22–32)
CO2: 22 mmol/L (ref 22–32)
CO2: 22 mmol/L (ref 22–32)
Calcium: 8.5 mg/dL — ABNORMAL LOW (ref 8.9–10.3)
Calcium: 8.6 mg/dL — ABNORMAL LOW (ref 8.9–10.3)
Calcium: 8.7 mg/dL — ABNORMAL LOW (ref 8.9–10.3)
Calcium: 8.7 mg/dL — ABNORMAL LOW (ref 8.9–10.3)
Chloride: 101 mmol/L (ref 98–111)
Chloride: 103 mmol/L (ref 98–111)
Chloride: 99 mmol/L (ref 98–111)
Chloride: 99 mmol/L (ref 98–111)
Creatinine, Ser: 3.46 mg/dL — ABNORMAL HIGH (ref 0.61–1.24)
Creatinine, Ser: 3.7 mg/dL — ABNORMAL HIGH (ref 0.61–1.24)
Creatinine, Ser: 3.7 mg/dL — ABNORMAL HIGH (ref 0.61–1.24)
Creatinine, Ser: 3.77 mg/dL — ABNORMAL HIGH (ref 0.61–1.24)
GFR calc Af Amer: 23 mL/min — ABNORMAL LOW (ref >60)
GFR calc Af Amer: 23 mL/min — ABNORMAL LOW (ref >60)
GFR calc Af Amer: 23 mL/min — ABNORMAL LOW (ref >60)
GFR calc Af Amer: 25 mL/min — ABNORMAL LOW (ref >60)
GFR calc non Af Amer: 20 mL/min — ABNORMAL LOW (ref >60)
GFR calc non Af Amer: 20 mL/min — ABNORMAL LOW (ref >60)
GFR calc non Af Amer: 20 mL/min — ABNORMAL LOW (ref >60)
GFR calc non Af Amer: 22 mL/min — ABNORMAL LOW (ref >60)
Glucose, Bld: 136 mg/dL — ABNORMAL HIGH (ref 70–99)
Glucose, Bld: 166 mg/dL — ABNORMAL HIGH (ref 70–99)
Glucose, Bld: 173 mg/dL — ABNORMAL HIGH (ref 70–99)
Glucose, Bld: 184 mg/dL — ABNORMAL HIGH (ref 70–99)
Potassium: 3.1 mmol/L — ABNORMAL LOW (ref 3.5–5.1)
Potassium: 3.3 mmol/L — ABNORMAL LOW (ref 3.5–5.1)
Potassium: 3.4 mmol/L — ABNORMAL LOW (ref 3.5–5.1)
Potassium: 3.8 mmol/L (ref 3.5–5.1)
Sodium: 136 mmol/L (ref 135–145)
Sodium: 136 mmol/L (ref 135–145)
Sodium: 138 mmol/L (ref 135–145)
Sodium: 138 mmol/L (ref 135–145)

## 2019-09-24 LAB — CBC
HCT: 38.2 % — ABNORMAL LOW (ref 39.0–52.0)
Hemoglobin: 13.4 g/dL (ref 13.0–17.0)
MCH: 30.4 pg (ref 26.0–34.0)
MCHC: 35.1 g/dL (ref 30.0–36.0)
MCV: 86.6 fL (ref 80.0–100.0)
Platelets: 246 10*3/uL (ref 150–400)
RBC: 4.41 MIL/uL (ref 4.22–5.81)
RDW: 12.3 % (ref 11.5–15.5)
WBC: 22.2 10*3/uL — ABNORMAL HIGH (ref 4.0–10.5)
nRBC: 0 % (ref 0.0–0.2)

## 2019-09-24 LAB — HEMOGLOBIN A1C
Hgb A1c MFr Bld: 7.3 % — ABNORMAL HIGH (ref 4.8–5.6)
Mean Plasma Glucose: 162.81 mg/dL

## 2019-09-24 LAB — LACTIC ACID, PLASMA: Lactic Acid, Venous: 1.7 mmol/L (ref 0.5–1.9)

## 2019-09-24 LAB — MRSA PCR SCREENING: MRSA by PCR: NEGATIVE

## 2019-09-24 MED ORDER — LORAZEPAM 2 MG/ML IJ SOLN
1.0000 mg | Freq: Once | INTRAMUSCULAR | Status: AC | PRN
  Administered 2019-09-24: 1 mg via INTRAVENOUS
  Filled 2019-09-24: qty 1

## 2019-09-24 MED ORDER — CLONIDINE HCL 0.2 MG PO TABS
0.2000 mg | ORAL_TABLET | Freq: Two times a day (BID) | ORAL | Status: DC
  Administered 2019-09-24: 0.2 mg via ORAL
  Filled 2019-09-24: qty 1

## 2019-09-24 MED ORDER — INSULIN ASPART 100 UNIT/ML ~~LOC~~ SOLN
3.0000 [IU] | Freq: Three times a day (TID) | SUBCUTANEOUS | Status: DC
  Administered 2019-09-25 – 2019-09-26 (×3): 3 [IU] via SUBCUTANEOUS

## 2019-09-24 MED ORDER — POTASSIUM CHLORIDE CRYS ER 20 MEQ PO TBCR
40.0000 meq | EXTENDED_RELEASE_TABLET | Freq: Once | ORAL | Status: AC
  Administered 2019-09-24: 40 meq via ORAL
  Filled 2019-09-24: qty 2

## 2019-09-24 MED ORDER — CLOPIDOGREL BISULFATE 75 MG PO TABS
75.0000 mg | ORAL_TABLET | Freq: Every day | ORAL | Status: DC
  Administered 2019-09-24 – 2019-09-26 (×3): 75 mg via ORAL
  Filled 2019-09-24 (×3): qty 1

## 2019-09-24 MED ORDER — INSULIN ASPART 100 UNIT/ML ~~LOC~~ SOLN
0.0000 [IU] | Freq: Every day | SUBCUTANEOUS | Status: DC
  Administered 2019-09-25: 4 [IU] via SUBCUTANEOUS

## 2019-09-24 MED ORDER — LORAZEPAM 2 MG/ML IJ SOLN
1.0000 mg | Freq: Once | INTRAMUSCULAR | Status: AC
  Administered 2019-09-24: 1 mg via INTRAVENOUS
  Filled 2019-09-24: qty 1

## 2019-09-24 MED ORDER — LORAZEPAM 2 MG/ML IJ SOLN
1.0000 mg | Freq: Four times a day (QID) | INTRAMUSCULAR | Status: DC | PRN
  Administered 2019-09-24: 1 mg via INTRAVENOUS
  Filled 2019-09-24: qty 1

## 2019-09-24 MED ORDER — LACTATED RINGERS IV SOLN
INTRAVENOUS | Status: AC
  Administered 2019-09-24: 16:00:00 via INTRAVENOUS

## 2019-09-24 MED ORDER — INSULIN ASPART 100 UNIT/ML ~~LOC~~ SOLN
0.0000 [IU] | Freq: Three times a day (TID) | SUBCUTANEOUS | Status: DC
  Administered 2019-09-24: 3 [IU] via SUBCUTANEOUS
  Administered 2019-09-24: 2 [IU] via SUBCUTANEOUS
  Administered 2019-09-25: 5 [IU] via SUBCUTANEOUS
  Administered 2019-09-25 – 2019-09-26 (×2): 2 [IU] via SUBCUTANEOUS

## 2019-09-24 MED ORDER — LABETALOL HCL 5 MG/ML IV SOLN: 5.0000 mg | INTRAVENOUS | Status: DC | PRN

## 2019-09-24 MED ORDER — METOPROLOL TARTRATE 5 MG/5ML IV SOLN
5.0000 mg | INTRAVENOUS | Status: DC | PRN
  Administered 2019-09-24 (×2): 5 mg via INTRAVENOUS
  Filled 2019-09-24 (×2): qty 5

## 2019-09-24 MED ORDER — INSULIN NPH (HUMAN) (ISOPHANE) 100 UNIT/ML ~~LOC~~ SUSP
10.0000 [IU] | Freq: Two times a day (BID) | SUBCUTANEOUS | Status: DC
  Administered 2019-09-24 (×2): 10 [IU] via SUBCUTANEOUS
  Filled 2019-09-24: qty 10

## 2019-09-24 MED ORDER — ONDANSETRON HCL 4 MG/2ML IJ SOLN: 4.0000 mg | Freq: Four times a day (QID) | INTRAMUSCULAR | Status: DC | PRN

## 2019-09-24 MED ORDER — LABETALOL HCL 5 MG/ML IV SOLN
10.0000 mg | INTRAVENOUS | Status: DC | PRN
  Administered 2019-09-24: 10 mg via INTRAVENOUS
  Filled 2019-09-24: qty 4

## 2019-09-24 MED ORDER — CARVEDILOL 12.5 MG PO TABS
12.5000 mg | ORAL_TABLET | Freq: Two times a day (BID) | ORAL | Status: DC
  Administered 2019-09-25: 12.5 mg via ORAL
  Filled 2019-09-24: qty 1

## 2019-09-24 MED ORDER — CLONIDINE HCL 0.1 MG PO TABS
0.1000 mg | ORAL_TABLET | Freq: Two times a day (BID) | ORAL | Status: DC
  Administered 2019-09-24 – 2019-09-25 (×2): 0.1 mg via ORAL
  Filled 2019-09-24 (×2): qty 1

## 2019-09-24 MED ORDER — HALOPERIDOL LACTATE 5 MG/ML IJ SOLN
5.0000 mg | Freq: Once | INTRAMUSCULAR | Status: AC
  Administered 2019-09-24: 5 mg via INTRAVENOUS
  Filled 2019-09-24: qty 1

## 2019-09-24 MED ORDER — CARVEDILOL 25 MG PO TABS
25.0000 mg | ORAL_TABLET | Freq: Two times a day (BID) | ORAL | Status: DC
  Administered 2019-09-24: 25 mg via ORAL
  Filled 2019-09-24: qty 1

## 2019-09-24 NOTE — Progress Notes (Signed)
Inpatient Diabetes Program Recommendations  AACE/ADA: New Consensus Statement on Inpatient Glycemic Control (2015)  Target Ranges:  Prepandial:   less than 140 mg/dL      Peak postprandial:   less than 180 mg/dL (1-2 hours)      Critically ill patients:  140 - 180 mg/dL   Lab Results  Component Value Date   GLUCAP 111 (H) 09/24/2019   HGBA1C 7.3 (H) 09/24/2019    Review of Glycemic Control  Diabetes history: DM1 Outpatient Diabetes medications: NPH 15 units bid, Novolin R 4 units tidwc Current orders for Inpatient glycemic control: NPH 10 units bid, Novolog 0-9 units tidwc and 0-5 units QHS + 3 units tidwc  HgbA1C 7.3% Eating < 50% meals, therefore no meal cov today   Inpatient Diabetes Program Recommendations:     Agree with orders. IV insulin drip d/ced and DKA resolved  To f/u with [facility] pharmacist regarding his diabetes control.   Thank you. Lorenda Peck, RD, LDN, CDE Inpatient Diabetes Coordinator [phone]

## 2019-09-24 NOTE — Consult Note (Signed)
NEURO HOSPITALIST CONSULT NOTE   Requestig physician: Dr. Florene Glen  Reason for Consult: Confusion with drowsiness and agitation. Acute stroke seen on MRI brain.   History obtained from:  Chart     HPI:                                                                                                                                          George [name] is an 33 y.o. male with a medical history significant of type 1 diabetes, hypertension, CKD Stage III. 06/02/19 he had a large left PCA ischemic infarct resulting in decreased right visual field acuity and word finding difficulty. Etiology was felt to be likely a cardioembolic source. MRA showed severe stenosis of his L PCA. Carotid dopplers were unremarkable. Echo EF 50-55%. No evidence of DVT. Hypercoagulable/vasculitis work-up negative. Other stroke risk factors include ETOH use, THC use and tobacco use. TEE was recommended and completed on 6/22. The TEE found no PFO, no cardiac source for embolism identified. Recommendations were to consider CardioNet monitoring if TEE work-up was unrevealing. During his June hospital admission he was also treated for agitation, hypertension and he had DKA treated with an insulin drip. For his stroke he was placed on dual antiplatelet for 2 months and then ASA alone was recommended. He was also discharged on Atorvastatin. He was advised to schedule a follow-up with [facility] in 4 weeks.   He was seen by Dr. Leonie Man and his nurse practitioner in clinic on 07/26/19 at [facility]. At that time the patient was reporting expressive aphasia and delayed recall, however they were unable to appreciate speech difficulty on exam. His recent and remote memory were also found to be intact on examination. He described his word finding as only occasional word finding difficulty. It was reported that he initially had trouble with video games, but that had improved. He had not started therapy because  he was waiting on insurance. It was reported that he lives at home with his mother. Recommendations were to Continue aspirin 81mg  and to stop Plavix on 09/17/19 and continue Aspirin alone. Dr. Leonie Man felt that the MRA head showed a likely occlusion of the left vertebral artery and not stenosis, further suggesting cardioembolic source. A 30 day cardiac event monitor was recommended.   The patient is followed by Dr. Asencion Noble and he has been managing his hypertension,  Diabetes and CKD. Last office visit with Dr. Joya Gaskins was on 09/19/19  The patient presented yesterday via EMS for evaluation of nausea and vomiting. He complained of abdominal pain upon admission. CT Abdomen/Pelvis had no acute findings. He denied and drug or alcohol use. He is reported to have been cooperative upon admission, but then became agitated and pulled out his IV. Restraint use was required. According  to his H&P he responded "yes" to all ROS questions and repeatedly stated "pelase help me". He was noted to be tachycardiac. COVID test negative. He was admitted for diabetic ketoacidosis on an insulin drip, altered mental status, AKI and history of CVA  CT yesterday on 09/24/19 found 2-3 areas of low attenuation in the cerebellar hemispheres, possible interval lacunar infarcts, raising the possibility of embolic disease. MRI was recommended. MRI of the brain today identified a 40mm acute infarct right parietal cortex over the convexity. Small area of subacute infarct right cerebellum. Chronic infarct left cerebellum. Chronic hemorraghic infarct left occipital lobe is mentioned, he has a known ischemic infarct with hemorraghic transformation from June, however other chronic infarcts were not reported at that time.  After he was admitted in June of this year with a stroke with full work up including TEE performed at that time, he followed up in the stroke clinic in late July. Summary of work up from [facility] stroke clinic note follows: "George  Sowinskiis being seen today for in office hospital follow-up regarding left PICA infarct embolic pattern secondary to large vessel disease versus cardioembolic source on [id number].  History obtained from patient, mother and chart review. Reviewed all radiology images and labs personally. George Sowinskiis a 33 y.o.malewith history of type I DB presented on 06/12/2019 with receptive aphasia and word salad responses, not oriented with HA.  Stroke work-up revealed large left PCA infarct embolic pattern secondary to large vessel disease versus cardioembolic source.  Initial CT head showed large left PCA infarct.  MRI brain showed large left PCA infarct with petechial hemorrhage left occipital lobe HI 1.  Carotid Doppler and 2D echo unremarkable.  Lower extremity venous Dopplers negative for DVT.  TEE did not show evidence of PFO or cardiac source of embolus.  Recommended 30-day cardiac event monitor outpatient to assess for atrial fibrillation.  Hypercoagulable/vasculitis work-up negative.  LDL 102 and A1c 8.4.  Recommended DAPT for 3 months then aspirin alone due to intracranial stenosis.  No prior history of HTN with elevation during admission and started on amlodipine 10 mg and metoprolol 50 mg twice daily.  Also found to have CKD, stage III with referral to nephrology.  Initiated atorvastatin 40 mg daily.  Advised to follow-up with PCP for uncontrolled DM.  Initial finding of leukocytosis improved during admission with work-up unremarkable and likely reactive.  Other stroke risk factors include EtOH use, THC use and tobacco use.  He was discharged home in stable condition with residual aphasia. Residual deficits expressive aphasia and delayed recall.  He does endorse overall improvement with only occasional word finding difficulty.  He is currently in the process of obtaining insurance therefore therapies were not started.  Has returned to playing video games with improvement - was having difficulty initially   He continues to live at home with his mother but is able to perform all prior activities He does have complaints of swelling bilateral lower extremity swelling - started once he started his new prescriptions; he does have appointment next Monday with Nephrology for initial evaluation due to elevated creatinine and CKD stage III. Continues on aspirin and Plavix without bleeding or bruising. Continues on atorvastatin without myalgias -recent lipid panel by PCP LDL 71 Blood pressure stable at 139/83. No further concerns at this time."   Past Medical History:  Diagnosis Date  . AKI (acute kidney injury) ([place])   . Cerebral infarction ([place])   . Neurologic deficit due to acute ischemic cerebrovascular accident (CVA) ([place])  06/13/2019  . Supraventricular tachycardia ([place])   . Type 1 diabetes [facility])     Past Surgical History:  Procedure Laterality Date  . BUBBLE STUDY  06/20/2019   Procedure: BUBBLE STUDY;  Surgeon: Fay Records, MD;  Location: [place];  Service: Cardiovascular;;  . TEE WITHOUT CARDIOVERSION N/A 06/20/2019   Procedure: TRANSESOPHAGEAL ECHOCARDIOGRAM (TEE);  Surgeon: Fay Records, MD;  Location: [facility] ENDOSCOPY;  Service: Cardiovascular;  Laterality: N/A;    No family history on file.            Social History:  reports that he has been smoking cigarettes. He started smoking about 8 years ago. He has been smoking about 0.50 packs per day. He has never used smokeless tobacco. He reports previous alcohol use. He reports previous drug use.  No Known Allergies  MEDICATIONS:                                                                                                                     Scheduled: . [START ON 09/25/2019] carvedilol  12.5 mg Oral BID WC  . cloNIDine  0.1 mg Oral BID  . clopidogrel  75 mg Oral Daily  . heparin  5,000 Units Subcutaneous Q8H  . insulin aspart  0-5 Units Subcutaneous QHS  . insulin aspart  0-9 Units Subcutaneous TID WC  . insulin aspart  3 Units  Subcutaneous TID WC  . insulin NPH Human  10 Units Subcutaneous BID AC & HS   Continuous: . insulin Stopped (09/24/19 1206)  . lactated ringers 100 mL/hr at 09/24/19 1628     ROS:                                                                                                                                       As per HPI.   Blood pressure (!) 165/108, pulse (!) 117, temperature 98.2 F (36.8 C), temperature source Axillary, resp. rate (!) 25, height 5\' 3"  (1.6 m), weight 54.8 kg, SpO2 100 %.   General Examination:  Physical Exam  General: The patient is of short stature.  HEENT-  Normocephalic, no lesions, without obvious abnormality.  Normal external eye and conjunctiva.   Cardiovascular- on bedside telemetry, pulses palpable throughout   Lungs- no excessive working breathing.  Saturations within normal limits Abdomen- All 4 quadrants palpated and nontender Extremities- Warm, dry and intact Musculoskeletal-no joint tenderness, deformity or swelling Skin-warm and dry, no hyperpigmentation, vitiligo, or suspicious lesions  Neurological Examination Mental Status: Very drowsy, opens eyes when asked multiple times. Not oriented to place, believes he is at home, when asked if he is in hospital he said no. Would not attempt year, for month he stated August. Able to state that he is 9. Able to follow 3 step commands with difficulty, very drowsy. Able to repeat "today is a sunny day". Very drowsy and it was difficulty to get him to name objects. He was able to identify a phone and flashlight. When asked what his toes were called he said "foot stools". When shown a toothbrush he described it as, "a device for shooting". On follow up attending exam, the patient's mentation had improved significantly and he was oriented as well as able to converse with normal responses to questions, although  with short answers containing little information.  Cranial Nerves: II: Again very drowsy and cooperation difficult. He had difficulty recognizing fingers in right peripheral vision, especially upper.   III,IV, VI: ptosis not present, extra-ocular motions intact bilaterally pupils equal, round, reactive to light and accommodation V,VII: smile symmetric, facial light touch sensation normal bilaterally VIII: hearing normal bilaterally IX,X: Palate rises symmetrically XI: bilateral shoulder shrug symmetric and strong Motor: Right : Upper extremity   5/5    Left:     Upper extremity   5/5  Lower extremity   5/5     Lower extremity   5/5 Tone and bulk:normal tone throughout; no atrophy noted Sensory: light touch intact throughout, bilaterally. However, patient would not cooperate with extinction questions and kept falling asleep Cerebellar: normal finger-to-nose and normal heel-to-shin test Gait: not tested.  Marland Kitchen   NIHSS 4 (1 LOC, 1 questions, 1 visual fields, 1 language)   Lab Results: Basic Metabolic Panel: Recent Labs  Lab 09/23/19 2155 09/24/19 0019 09/24/19 0454 09/24/19 0809 09/24/19 1213  NA 135 136 136 138 138  K 3.3* 3.4* 3.1* 3.3* 3.8  CL 98 99 99 101 103  CO2 22 22 22 22 22   GLUCOSE 223* 184* 136* 166* 173*  BUN 56* 51* 49* 47* 47*  CREATININE 3.92* 3.77* 3.70* 3.70* 3.46*  CALCIUM 8.4* 8.7* 8.7* 8.5* 8.6*    CBC: Recent Labs  Lab 09/23/19 1601 09/23/19 1609 09/24/19 0809  WBC 29.6*  --  22.2*  NEUTROABS 27.1*  --   --   HGB 14.4 14.6 13.4  HCT 40.4 43.0 38.2*  MCV 85.8  --  86.6  PLT 296  --  246    Cardiac Enzymes: No results for input(s): CKTOTAL, CKMB, CKMBINDEX, TROPONINI in the last 168 hours.  Lipid Panel: No results for input(s): CHOL, TRIG, HDL, CHOLHDL, VLDL, LDLCALC in the last 168 hours.  Imaging: Ct Abdomen Pelvis Wo Contrast  Result Date: 09/23/2019 CLINICAL DATA:  Nausea and vomiting since Wednesday. EXAM: CT ABDOMEN AND PELVIS WITHOUT  CONTRAST TECHNIQUE: Multidetector CT imaging of the abdomen and pelvis was performed following the standard protocol without IV contrast. COMPARISON:  None. FINDINGS: Lower chest: The lung bases are clear of an acute process. No pleural effusions. The heart  is normal in size. No pericardial effusion. Mild bilateral gynecomastia noted. Hepatobiliary: No focal hepatic lesions are identified without contrast. The gallbladder contains high attenuation material which could be sludge or stones. I do not see any obvious findings for acute cholecystitis but right upper quadrant ultrasound examination may be helpful for further evaluation. No common bile duct dilatation. Pancreas: No mass, inflammation or ductal dilatation. Spleen: Normal size.  No focal lesions. Adrenals/Urinary Tract: The adrenal glands and kidneys are unremarkable. No renal, ureteral or bladder calculi or obvious mass. Stomach/Bowel: The stomach, duodenum, small bowel and colon are grossly unremarkable without oral contrast. No acute inflammatory changes, mass lesions or obstructive findings. The appendix is opacified possibly related to a prior contrasted study. No inflammation to suggest appendicitis. Vascular/Lymphatic: The aorta is normal in caliber. No atheroscerlotic calcifications. No mesenteric of retroperitoneal mass or adenopathy. Small scattered lymph nodes are noted. Reproductive: The prostate gland and seminal vesicles are unremarkable. Other: No pelvic mass or adenopathy. No free pelvic fluid collections. No inguinal mass or adenopathy. No abdominal wall hernia or subcutaneous lesions. Musculoskeletal: No significant bony findings. IMPRESSION: 1. No acute abdominal/pelvic findings, mass lesions or adenopathy. 2. No renal, ureteral or bladder calculi or mass. 3. High attenuation material in the gallbladder could be sludge or stones. No obvious CT findings for acute cholecystitis. Right upper quadrant ultrasound examination may be helpful for  further evaluation. Electronically Signed   By: Marijo Sanes M.D.   On: 09/23/2019 18:51   Ct Head Wo Contrast  Result Date: 09/23/2019 CLINICAL DATA:  Altered level of consciousness. EXAM: CT HEAD WITHOUT CONTRAST TECHNIQUE: Contiguous axial images were obtained from the base of the skull through the vertex without intravenous contrast. COMPARISON:  June 12, 2019 FINDINGS: Brain: No subdural, epidural, or subarachnoid hemorrhage 3 tiny rounded regions of low attenuation are seen in the cerebellum with 2 on the right and 1 on the left as seen on series 3, images 12 and 13. These findings were not seen previously. The basal cisterns are patent. This the brainstem is normal. The ventricles are stable. The left a separate all and temporal lobe infarct seen on the previous study are again noted with decreasing attenuation/encephalomalacia. No other sites of interval ischemia or infarct. No mass effect or midline shift. Vascular: No hyperdense vessel or unexpected calcification. Skull: Normal. Negative for fracture or focal lesion. Sinuses/Orbits: No acute finding. Other: None. IMPRESSION: 1. Two or 3 rounded/oval regions of low attenuation in the cerebellar hemispheres as described above, not seen on the previous study. These could represent small interval lacunar infarcts. The multiplicity raises the possibility of embolic disease. The findings are otherwise age indeterminate. An MRI could further assess and determine acuity. 2. No other acute abnormalities are noted. Electronically Signed   By: Dorise Bullion III M.D   On: 09/23/2019 18:54   Mr Brain Wo Contrast  Result Date: 09/24/2019 CLINICAL DATA:  Rule out acute infarct. EXAM: MRI HEAD WITHOUT CONTRAST TECHNIQUE: Multiplanar, multiecho pulse sequences of the brain and surrounding structures were obtained without intravenous contrast. COMPARISON:  CT head 09/23/2019.  MRI head 06/14/2019 FINDINGS: Brain: Small focus of restricted diffusion in the right  parietal cortex over the convexity compatible with acute infarct. 5 mm. Facilitated diffusion right cerebellar hemisphere. Associated T2 and FLAIR hyperintensity. Probable subacute, recent infarction. This was not present on the prior MRI. Chronic infarct left cerebellum, not present on the prior MRI. Chronic left PCA infarct involving the left occipital lobe. This shows evidence  of chronic mild hemorrhage. Ventricle size normal. Vascular: Normal arterial flow voids. Skull and upper cervical spine: Negative Sinuses/Orbits: Negative Other: None IMPRESSION: 1. 5 mm acute infarct right parietal cortex over the convexity 2. Small area of subacute infarct right cerebellum. Chronic infarct left cerebellum 3. Chronic hemorrhagic infarct left occipital lobe. Electronically Signed   By: Franchot Gallo M.D.   On: 09/24/2019 12:03   US Renal  Result Date: 09/24/2019 CLINICAL DATA:  Acute kidney injury EXAM: RENAL / URINARY TRACT ULTRASOUND COMPLETE COMPARISON:  None. FINDINGS: Right Kidney: Renal measurements: 11.1 x 4.9 x 6.2 cm = volume: 172 mL . Echogenicity within normal limits. No mass or hydronephrosis visualized. Left Kidney: Renal measurements: 10.8 x 5.3 x 5.5 cm = volume: There is 165 mL. Echogenicity within normal limits. No mass or hydronephrosis visualized. Bladder: Distended bladder. IMPRESSION: 1. No significant renal abnormality. 2. Distended bladder. Electronically Signed   By: Suzy Bouchard M.D.   On: 09/24/2019 13:39   Dg Chest Port 1 View  Result Date: 09/23/2019 CLINICAL DATA:  Hyperglycemia.  Low-grade fever. EXAM: PORTABLE CHEST 1 VIEW COMPARISON:  June 15, 2019 FINDINGS: The heart size and mediastinal contours are within normal limits. Both lungs are clear. The visualized skeletal structures are unremarkable. IMPRESSION: No active disease. Electronically Signed   By: Dorise Bullion III M.D   On: 09/23/2019 16:33   Brain MRI: 09/24/19 Identified a 1mm acute infarct right parietal cortex  over the convexity. Small area of subacute infarct right cerebellum. Chronic infarct left cerebellum. Chronic hemorraghic infarct left occipital lobe is mentioned, he has a known ischemic infarct with hemorraghic transformation from June, however other chronic infarcts were not reported at that time.  MRA 06/14/19 Severe stenosis of large left posterior temporal artery branch of the left PCA. However in follow-up clinic at [facility], Dr. Leonie Man felt the artery was likely occluded, not stenosis.   Echo TEE: 06/20/19 No PFO or Cardiac Source Identified   Carotid Ultrasound 06/13/19 Unremarkable  Lower Extremity Ultrasound 06/13/19 No DVT  HBA1C 9/26: 7.3 (down from 8.4 three months ago) LDL 6/29: 71   Assessment: 33 year old male with a history of L PCA stroke 6/20, presenting with nausea and vomiting. On admission he was cooperative, but then developed altered mental status and agitation. Brain MRI identified acute, subacute and interval strokes since his stroke in June. He is also being treated for Type 1 Diabetes and DKA, hypertension and CKD. Additional information learned from the bedside nurse, the patient does not have insurance and had not been getting his medicine regularly. The patient is confused on initial exam, with improvement on follow up attending exam, but still slightly abulic.   1. Stroke work up completed:    -Brain MRI this admission: 38mm acute infarct right parietal cortex over the convexity. Small area of subacute infarct right cerebellum. Chronic infarct left cerebellum. Chronic hemorraghic infarct left occipital lobe is mentioned, he has a known ischemic infarct with hemorraghic transformation from June, however other chronic infarcts were not reported at that time.  - MRA 06/14/19 Severe stenosis of large left posterior temporal artery branch of the left PCA. However in follow-up clinic at [facility], Dr. Leonie Man felt the artery was likely occluded,  not stenosis.   - Echo TEE: 06/20/19 No PFO or Cardiac Source Identified   - Carotid Ultrasound 06/13/19 Unremarkable 2.  HBA1C 9/26: 7.3 (down from 8.4 three months ago). LDL 6/29: 71 3. Multiple Ischemic Strokes: Likely Cardioembolic MRI today  identified acute right parietal, subacute right cerebellum, interval left cerebellum infarct since known left occipital infarct in 6/20. Currently on ASA 325mg  and Plavix 75mg . The recommendation from Dr. Leonie Man was to stop Plavix 09/17/19 and continue ASA 81mg  4. Hyperlipidemia: currently on Atorvastatin 40mg . Most recent LDL 71. 5. DVT Proph - Currently on Heparin  6. Other Stroke Risk Factors: ETOH and THC 7. Type 1 Diabetes, CKD Stage III and Hypertension currently being managed by hospitalist team. Insulin drip was discontinued today.  8. Prior vasculitis work up was negative.   Recommendations: 1. Ischemic Stroke - Likely Cardioembolic.The patient will be followed by the Stroke Service tomorrow. Currently on ASA 325mg  and Plavix 75mg . 2. Recommend YALE swallow screen be completed due to stroke 3. Hyperlipidemia - Continue Atorvastatin 40mg  4. DVT Prophylaxis - Continue Heparin  5. Encouraged ETOH and THC cessation, however patient was very drowsy and confused. He will require further cessation counseling.   Gwenyth Bouillon, DNP, FNP-C [facility] Nurse Practitioner  I have seen and examined the patient. Assessment and recommendations discussed with Gwenyth Bouillon, NP. 33 year old male with recent stroke in June, re-presents with recurrent stroke. Prior vasculitis work up and TEE were negative. Recommendations as above.   Electronically signed: Dr. Kerney Elbe

## 2019-09-24 NOTE — Progress Notes (Addendum)
PROGRESS NOTE    [facility]  MZ:[id number] DOB: July 09, 1986 DOA: 09/23/2019 PCP: Elsie Stain, MD   Brief Narrative: George [name] is George [name] 33 y.o. male with medical history significant of type 1 diabetes, hypertension, recent CVA in June with combined receptive and expressive aphasia, CKD stage III who presented here with altered mental status and reportedly has been having nausea and persistent vomiting since Wednesday.  Reportedly from EDP patient was initially able to give some history but acutely became more agitated and was pulling out his IV.  He was tachycardic up to 160s and had improvement of heart rate down to 120s with 10 mg of verapamil.  He was also tachypneic but remained on room air.  Patient was restless and agitated at the time my evaluation and per nursing report he had attempted to use his foot to remove his hand restraints.  He repeatedly stated "please help me," but was unable to explain any of his symptoms to me.  He was pan positive and replied yes to every review of systems questions.  Also stating he has pain everywhere.  When asked for his full name and where he was he only stated "plus."  He was afebrile and hypertensive up to 170s/108. In sinus tachycardia up to 160 and mildly tachypneic on room air.  CBC showed leukocytosis of 29.6 and no anemia.  CMP showed glucose level 532 with bicarb of 18 and anion gap of 24.  pH was mildly alkalotic at 7.59.  Lactic acid of 5 and then 3.2 following IV fluids.  TSH normal.  COVID testing negative.  CT head shows 2 or 3 rounded oval regions of low attenuation in the cerebral hemisphere not seen on previous studies.  Suggest could be small interval lacunar infarcts with the possibility of embolic disease.  Findings were age-indeterminate.  CT abdomen showed no acute findings.  There is gallbladder sludge or stones but no findings of acute cholecystitis.  Assessment & Plan:   Active Problems:   History of CVA (cerebrovascular  accident)   AKI (acute kidney injury) ([place])   DKA (diabetic ketoacidoses) ([place])  Diabetic ketoacidosis   T1DM - Unclear precipitating cause.   - He's had borderline temps, tmax of 100.2 since admission, but clinically appears to be improving and UA and CXR not suggestive of infection - follow blood cx and follow urine cx (urine did have WBC, but negative LE/nitrites) - hold off on abx for now - DKA now resolved, AG 13, bicarb 22, BG173 - resume home insulin at reduced dose 10 units NPH with 3 units at meals and SSI - Diabetic coordinator - A1c is 7.3 - check HbA1C  Altered mental status/delirium - seems improved from admission at this time - utox positive for MJ - suspect this was delirium in setting of above - ativan q6 prn for agitation - delirium precautions  Acute Infarct R Parietal Cortex   Subacute Infarct R Cerebellum   Hx CVA: - Hx L PCA infarct in June - Was on ASA/plavix and supposed to stop plavix and continue ASA alone after 9/19 (admitting provider started on plavix, will defer to neuro) - Thought 2/2 cardioembolic source -> they'd planned for 30 day event monitor as echo and TEE were negative for PFO or idenifiable cardiac source of embolus and hypercoagulable/vasculitis w/u was negative per recent neuro note - will reconsult neurology in setting of acute stroke, appreciate recs  Hypertension   Tachycardia Significantly elevated BP's this AM His clonidine and coreg  were being held -> which likely contributed to rebound hypertension and tachycardia Will restart these (at reduced doses), but hold his other home BP meds for permissive hypertension after his stroke EKG this AM with sinus tachycardia  Acute kidney injury -Creatinine of 4.11 on admission - renal US without hydro, distended bladder noted - follow bladder scan - may need foley - creatinine improving  -Continue to monitor serial BMP -Avoid nephrotoxic agent  Leukocytosis: improving, follow, follow  cx  DVT prophylaxis: heparin  Code Status:full  Family Communication: none at bedside - called mother to update Disposition Plan: pending further improvement  Consultants:   neurology  Procedures:   none  Antimicrobials:  Anti-infectives (From admission, onward)   None        Subjective: Denies complaints at this time  Objective: Vitals:   09/24/19 0719 09/24/19 0755 09/24/19 1200 09/24/19 1515  BP: (!) 186/123 (!) 201/117 (!) 165/108 (!) 175/122  Pulse: (!) 119 (!) 123 (!) 117 (!) 104  Resp: 18 15 (!) 25 (!) 21  Temp: 99.9 F (37.7 C)  98.2 F (36.8 C) 99.6 F (37.6 C)  TempSrc: Oral  Axillary Oral  SpO2: 99% 99% 100% 100%  Weight:      Height:        Intake/Output Summary (Last 24 hours) at 09/24/2019 1519 Last data filed at 09/24/2019 0403 Gross per 24 hour  Intake 2663.74 ml  Output --  Net 2663.74 ml   Filed Weights   09/23/19 1606 09/23/19 2205  Weight: 59 kg 54.8 kg    Examination:  General exam: Appears calm and comfortable.  In restraints. Respiratory system: Clear to auscultation. Respiratory effort normal. Cardiovascular system: S1 & S2 heard, RRR.  Gastrointestinal system: Abdomen is nondistended, soft and nontender. Central nervous system: Alert and oriented.  Some word finding difficulties, but otherwise, no apparent focal deficits.  Was appropriate for me, but per nursing has been agitated and impulsive requiring sitter. Extremities: no LEE Skin: No rashes, lesions or ulcers Psychiatry: Judgement and insight appear normal. Mood & affect appropriate.     Data Reviewed: I have personally reviewed following labs and imaging studies  CBC: Recent Labs  Lab 09/23/19 1601 09/23/19 1609 09/24/19 0809  WBC 29.6*  --  22.2*  NEUTROABS 27.1*  --   --   HGB 14.4 14.6 13.4  HCT 40.4 43.0 38.2*  MCV 85.8  --  86.6  PLT 296  --  0000000   Basic Metabolic Panel: Recent Labs  Lab 09/23/19 2155 09/24/19 0019 09/24/19 0454 09/24/19 0809  09/24/19 1213  NA 135 136 136 138 138  K 3.3* 3.4* 3.1* 3.3* 3.8  CL 98 99 99 101 103  CO2 22 22 22 22 22   GLUCOSE 223* 184* 136* 166* 173*  BUN 56* 51* 49* 47* 47*  CREATININE 3.92* 3.77* 3.70* 3.70* 3.46*  CALCIUM 8.4* 8.7* 8.7* 8.5* 8.6*   GFR: Estimated Creatinine Clearance: 23.5 mL/min (Carles Florea) (by C-G formula based on SCr of 3.46 mg/dL (H)). Liver Function Tests: Recent Labs  Lab 09/23/19 1601  AST 28  ALT 25  ALKPHOS 122  BILITOT 1.4*  PROT 7.1  ALBUMIN 4.3   No results for input(s): LIPASE, AMYLASE in the last 168 hours. No results for input(s): AMMONIA in the last 168 hours. Coagulation Profile: No results for input(s): INR, PROTIME in the last 168 hours. Cardiac Enzymes: No results for input(s): CKTOTAL, CKMB, CKMBINDEX, TROPONINI in the last 168 hours. BNP (last 3 results) No results  for input(s): PROBNP in the last 8760 hours. HbA1C: Recent Labs    09/24/19 1213  HGBA1C 7.3*   CBG: Recent Labs  Lab 09/24/19 0623 09/24/19 0717 09/24/19 0850 09/24/19 0950 09/24/19 1201  GLUCAP 122* 147* 225* 242* 177*   Lipid Profile: No results for input(s): CHOL, HDL, LDLCALC, TRIG, CHOLHDL, LDLDIRECT in the last 72 hours. Thyroid Function Tests: Recent Labs    09/23/19 1601  TSH 1.268   Anemia Panel: No results for input(s): VITAMINB12, FOLATE, FERRITIN, TIBC, IRON, RETICCTPCT in the last 72 hours. Sepsis Labs: Recent Labs  Lab 09/23/19 1600 09/23/19 1920 09/24/19 0809  LATICACIDVEN 5.0* 3.2* 1.7    Recent Results (from the past 240 hour(s))  Culture, blood (routine x 2)     Status: None (Preliminary result)   Collection Time: 09/23/19  4:01 PM   Specimen: BLOOD  Result Value Ref Range Status   Specimen Description BLOOD RIGHT ANTECUBITAL  Final   Special Requests   Final    BOTTLES DRAWN AEROBIC AND ANAEROBIC Blood Culture adequate volume   Culture   Final    NO GROWTH < 24 HOURS Performed at  Hospital Lab, [address], [place], [place]  [address]    Report Status PENDING  Incomplete  Culture, blood (routine x 2)     Status: None (Preliminary result)   Collection Time: 09/23/19  4:09 PM   Specimen: BLOOD RIGHT HAND  Result Value Ref Range Status   Specimen Description BLOOD RIGHT HAND  Final   Special Requests   Final    BOTTLES DRAWN AEROBIC AND ANAEROBIC Blood Culture adequate volume   Culture   Final    NO GROWTH < 24 HOURS Performed at [facility], [place] [address], [place], [place] [address]    Report Status PENDING  Incomplete  SARS Coronavirus 2 [facility] order, Performed in [facility] hospital lab) Nasopharyngeal Nasopharyngeal Swab     Status: None   Collection Time: 09/23/19  5:31 PM   Specimen: Nasopharyngeal Swab  Result Value Ref Range Status   SARS Coronavirus 2 NEGATIVE NEGATIVE Final    Comment: (NOTE) If result is NEGATIVE SARS-CoV-2 target nucleic acids are NOT DETECTED. The SARS-CoV-2 RNA is generally detectable in upper and lower  respiratory specimens during the acute phase of infection. The lowest  concentration of SARS-CoV-2 viral copies this assay can detect is 250  copies / mL. Juan Olthoff negative result does not preclude SARS-CoV-2 infection  and should not be used as the sole basis for treatment or other  patient management decisions.  Mcguire Gasparyan negative result may occur with  improper specimen collection / handling, submission of specimen other  than nasopharyngeal swab, presence of viral mutation(s) within the  areas targeted by this assay, and inadequate number of viral copies  (<250 copies / mL). Terrye Dombrosky negative result must be combined with clinical  observations, patient history, and epidemiological information. If result is POSITIVE SARS-CoV-2 target nucleic acids are DETECTED. The SARS-CoV-2 RNA is generally detectable in upper and lower  respiratory specimens dur ing the acute phase of infection.  Positive  results are indicative of active infection with SARS-CoV-2.  Clinical  correlation with  patient history and other diagnostic information is  necessary to determine patient infection status.  Positive results do  not rule out bacterial infection or co-infection with other viruses. If result is PRESUMPTIVE POSTIVE SARS-CoV-2 nucleic acids MAY BE PRESENT.   Dorota Heinrichs presumptive positive result was obtained on the submitted specimen  and confirmed on repeat testing.  While 2019 novel coronavirus  (SARS-CoV-2) nucleic acids may be present in the submitted sample  additional confirmatory testing may be necessary for epidemiological  and / or clinical management purposes  to differentiate between  SARS-CoV-2 and other Sarbecovirus currently known to infect humans.  If clinically indicated additional testing with an alternate test  methodology [phone]) is advised. The SARS-CoV-2 RNA is generally  detectable in upper and lower respiratory sp ecimens during the acute  phase of infection. The expected result is Negative. Fact Sheet for Patients:  StrictlyIdeas.no Fact Sheet for Healthcare Providers: BankingDealers.co.za This test is not yet approved or cleared by the [place] FDA and has been authorized for detection and/or diagnosis of SARS-CoV-2 by FDA under an Emergency Use Authorization (EUA).  This EUA will remain in effect (meaning this test can be used) for the duration of the COVID-19 declaration under Section 564(b)(1) of the Act, 21 U.S.C. section 360bbb-3(b)(1), unless the authorization is terminated or revoked sooner. Performed at [facility], [place] [address], [place], [place] [address]   MRSA PCR Screening     Status: None   Collection Time: 09/23/19 11:02 PM   Specimen: Nasal Mucosa; Nasopharyngeal  Result Value Ref Range Status   MRSA by PCR NEGATIVE NEGATIVE Final    Comment:        The GeneXpert MRSA Assay (FDA approved for NASAL specimens only), is one component of Alma Mohiuddin comprehensive MRSA  colonization surveillance program. It is not intended to diagnose MRSA infection nor to guide or monitor treatment for MRSA infections. Performed at [facility], [place] [address], [place], [place] [address]          Radiology Studies: Ct Abdomen Pelvis Wo Contrast  Result Date: 09/23/2019 CLINICAL DATA:  Nausea and vomiting since Wednesday. EXAM: CT ABDOMEN AND PELVIS WITHOUT CONTRAST TECHNIQUE: Multidetector CT imaging of the abdomen and pelvis was performed following the standard protocol without IV contrast. COMPARISON:  None. FINDINGS: Lower chest: The lung bases are clear of an acute process. No pleural effusions. The heart is normal in size. No pericardial effusion. Mild bilateral gynecomastia noted. Hepatobiliary: No focal hepatic lesions are identified without contrast. The gallbladder contains high attenuation material which could be sludge or stones. I do not see any obvious findings for acute cholecystitis but right upper quadrant ultrasound examination may be helpful for further evaluation. No common bile duct dilatation. Pancreas: No mass, inflammation or ductal dilatation. Spleen: Normal size.  No focal lesions. Adrenals/Urinary Tract: The adrenal glands and kidneys are unremarkable. No renal, ureteral or bladder calculi or obvious mass. Stomach/Bowel: The stomach, duodenum, small bowel and colon are grossly unremarkable without oral contrast. No acute inflammatory changes, mass lesions or obstructive findings. The appendix is opacified possibly related to Tabias Swayze prior contrasted study. No inflammation to suggest appendicitis. Vascular/Lymphatic: The aorta is normal in caliber. No atheroscerlotic calcifications. No mesenteric of retroperitoneal mass or adenopathy. Small scattered lymph nodes are noted. Reproductive: The prostate gland and seminal vesicles are unremarkable. Other: No pelvic mass or adenopathy. No free pelvic fluid collections. No inguinal mass or adenopathy. No  abdominal wall hernia or subcutaneous lesions. Musculoskeletal: No significant bony findings. IMPRESSION: 1. No acute abdominal/pelvic findings, mass lesions or adenopathy. 2. No renal, ureteral or bladder calculi or mass. 3. High attenuation material in the gallbladder could be sludge or stones. No obvious CT findings for acute cholecystitis. Right upper quadrant ultrasound examination may be helpful for further evaluation. Electronically  Signed   By: Marijo Sanes M.D.   On: 09/23/2019 18:51   Ct Head Wo Contrast  Result Date: 09/23/2019 CLINICAL DATA:  Altered level of consciousness. EXAM: CT HEAD WITHOUT CONTRAST TECHNIQUE: Contiguous axial images were obtained from the base of the skull through the vertex without intravenous contrast. COMPARISON:  June 12, 2019 FINDINGS: Brain: No subdural, epidural, or subarachnoid hemorrhage 3 tiny rounded regions of low attenuation are seen in the cerebellum with 2 on the right and 1 on the left as seen on series 3, images 12 and 13. These findings were not seen previously. The basal cisterns are patent. This the brainstem is normal. The ventricles are stable. The left Kevontae Burgoon separate all and temporal lobe infarct seen on the previous study are again noted with decreasing attenuation/encephalomalacia. No other sites of interval ischemia or infarct. No mass effect or midline shift. Vascular: No hyperdense vessel or unexpected calcification. Skull: Normal. Negative for fracture or focal lesion. Sinuses/Orbits: No acute finding. Other: None. IMPRESSION: 1. Two or 3 rounded/oval regions of low attenuation in the cerebellar hemispheres as described above, not seen on the previous study. These could represent small interval lacunar infarcts. The multiplicity raises the possibility of embolic disease. The findings are otherwise age indeterminate. An MRI could further assess and determine acuity. 2. No other acute abnormalities are noted. Electronically Signed   By: Dorise Bullion  III M.D   On: 09/23/2019 18:54   Mr Brain Wo Contrast  Result Date: 09/24/2019 CLINICAL DATA:  Rule out acute infarct. EXAM: MRI HEAD WITHOUT CONTRAST TECHNIQUE: Multiplanar, multiecho pulse sequences of the brain and surrounding structures were obtained without intravenous contrast. COMPARISON:  CT head 09/23/2019.  MRI head 06/14/2019 FINDINGS: Brain: Small focus of restricted diffusion in the right parietal cortex over the convexity compatible with acute infarct. 5 mm. Facilitated diffusion right cerebellar hemisphere. Associated T2 and FLAIR hyperintensity. Probable subacute, recent infarction. This was not present on the prior MRI. Chronic infarct left cerebellum, not present on the prior MRI. Chronic left PCA infarct involving the left occipital lobe. This shows evidence of chronic mild hemorrhage. Ventricle size normal. Vascular: Normal arterial flow voids. Skull and upper cervical spine: Negative Sinuses/Orbits: Negative Other: None IMPRESSION: 1. 5 mm acute infarct right parietal cortex over the convexity 2. Small area of subacute infarct right cerebellum. Chronic infarct left cerebellum 3. Chronic hemorrhagic infarct left occipital lobe. Electronically Signed   By: Franchot Gallo M.D.   On: 09/24/2019 12:03   US Renal  Result Date: 09/24/2019 CLINICAL DATA:  Acute kidney injury EXAM: RENAL / URINARY TRACT ULTRASOUND COMPLETE COMPARISON:  None. FINDINGS: Right Kidney: Renal measurements: 11.1 x 4.9 x 6.2 cm = volume: 172 mL . Echogenicity within normal limits. No mass or hydronephrosis visualized. Left Kidney: Renal measurements: 10.8 x 5.3 x 5.5 cm = volume: There is 165 mL. Echogenicity within normal limits. No mass or hydronephrosis visualized. Bladder: Distended bladder. IMPRESSION: 1. No significant renal abnormality. 2. Distended bladder. Electronically Signed   By: Suzy Bouchard M.D.   On: 09/24/2019 13:39   Dg Chest Port 1 View  Result Date: 09/23/2019 CLINICAL DATA:  Hyperglycemia.   Low-grade fever. EXAM: PORTABLE CHEST 1 VIEW COMPARISON:  June 15, 2019 FINDINGS: The heart size and mediastinal contours are within normal limits. Both lungs are clear. The visualized skeletal structures are unremarkable. IMPRESSION: No active disease. Electronically Signed   By: Dorise Bullion III M.D   On: 09/23/2019 16:33  Scheduled Meds:  carvedilol  25 mg Oral BID WC   cloNIDine  0.2 mg Oral BID   clopidogrel  75 mg Oral Daily   heparin  5,000 Units Subcutaneous Q8H   insulin aspart  0-5 Units Subcutaneous QHS   insulin aspart  0-9 Units Subcutaneous TID WC   insulin aspart  3 Units Subcutaneous TID WC   insulin NPH Human  10 Units Subcutaneous BID AC & HS   Continuous Infusions:  sodium chloride 125 mL/hr at 09/24/19 0515   dextrose 5 % and 0.45% NaCl 100 mL/hr at 09/23/19 2300   insulin Stopped (09/24/19 1206)     LOS: 1 day    Time spent: over 12 min    Fayrene Helper, MD [facility] Pager AMION  If 7PM-7AM, please contact night-coverage [email or web address] Password [facility] 09/24/2019, 3:19 PM

## 2019-09-24 NOTE — Progress Notes (Signed)
Patient was restless for entire shift, attempting to bite off mittens and removing restraints. MD notified. Medications given as needed. BP elevated and HR elevated. MD notified. Medications given as needed. Telesitter ordered. Not helpful but present.

## 2019-09-24 NOTE — Progress Notes (Signed)
See MAR.

## 2019-09-25 ENCOUNTER — Inpatient Hospital Stay (HOSPITAL_COMMUNITY): Payer: Medicaid Other

## 2019-09-25 LAB — GLUCOSE, CAPILLARY
Glucose-Capillary: 34 mg/dL — CL (ref 70–99)
Glucose-Capillary: 138 mg/dL — ABNORMAL HIGH (ref 70–99)
Glucose-Capillary: 277 mg/dL — ABNORMAL HIGH (ref 70–99)
Glucose-Capillary: 173 mg/dL — ABNORMAL HIGH (ref 70–99)
Glucose-Capillary: 53 mg/dL — ABNORMAL LOW (ref 70–99)
Glucose-Capillary: 119 mg/dL — ABNORMAL HIGH (ref 70–99)
Glucose-Capillary: 324 mg/dL — ABNORMAL HIGH (ref 70–99)

## 2019-09-25 LAB — COMPREHENSIVE METABOLIC PANEL
ALT: 20 U/L (ref 0–44)
AST: 20 U/L (ref 15–41)
Albumin: 3.2 g/dL — ABNORMAL LOW (ref 3.5–5.0)
Alkaline Phosphatase: 82 U/L (ref 38–126)
Anion gap: 10 (ref 5–15)
BUN: 34 mg/dL — ABNORMAL HIGH (ref 6–20)
CO2: 25 mmol/L (ref 22–32)
Calcium: 8.5 mg/dL — ABNORMAL LOW (ref 8.9–10.3)
Chloride: 105 mmol/L (ref 98–111)
Creatinine, Ser: 3.07 mg/dL — ABNORMAL HIGH (ref 0.61–1.24)
GFR calc Af Amer: 29 mL/min — ABNORMAL LOW (ref >60)
GFR calc non Af Amer: 25 mL/min — ABNORMAL LOW (ref >60)
Glucose, Bld: 45 mg/dL — ABNORMAL LOW (ref 70–99)
Potassium: 3.2 mmol/L — ABNORMAL LOW (ref 3.5–5.1)
Sodium: 140 mmol/L (ref 135–145)
Total Bilirubin: 0.8 mg/dL (ref 0.3–1.2)
Total Protein: 5.3 g/dL — ABNORMAL LOW (ref 6.5–8.1)

## 2019-09-25 LAB — CBC
HCT: 36.5 % — ABNORMAL LOW (ref 39.0–52.0)
Hemoglobin: 12.9 g/dL — ABNORMAL LOW (ref 13.0–17.0)
MCH: 31.1 pg (ref 26.0–34.0)
MCHC: 35.3 g/dL (ref 30.0–36.0)
MCV: 88 fL (ref 80.0–100.0)
Platelets: 223 10*3/uL (ref 150–400)
RBC: 4.15 MIL/uL — ABNORMAL LOW (ref 4.22–5.81)
RDW: 12.3 % (ref 11.5–15.5)
WBC: 14.3 10*3/uL — ABNORMAL HIGH (ref 4.0–10.5)
nRBC: 0 % (ref 0.0–0.2)

## 2019-09-25 LAB — URINE CULTURE: Culture: NO GROWTH

## 2019-09-25 LAB — MAGNESIUM: Magnesium: 2 mg/dL (ref 1.7–2.4)

## 2019-09-25 MED ORDER — CLONIDINE HCL 0.2 MG PO TABS
0.2000 mg | ORAL_TABLET | Freq: Two times a day (BID) | ORAL | Status: DC
  Administered 2019-09-25 – 2019-09-26 (×2): 0.2 mg via ORAL
  Filled 2019-09-25 (×2): qty 1

## 2019-09-25 MED ORDER — INSULIN NPH (HUMAN) (ISOPHANE) 100 UNIT/ML ~~LOC~~ SUSP
7.0000 [IU] | Freq: Two times a day (BID) | SUBCUTANEOUS | Status: DC
  Filled 2019-09-25: qty 10

## 2019-09-25 MED ORDER — DEXTROSE 50 % IV SOLN
INTRAVENOUS | Status: AC
  Administered 2019-09-25: 25 mL
  Filled 2019-09-25: qty 50

## 2019-09-25 MED ORDER — CARVEDILOL 25 MG PO TABS
25.0000 mg | ORAL_TABLET | Freq: Two times a day (BID) | ORAL | Status: DC
  Administered 2019-09-25 – 2019-09-26 (×2): 25 mg via ORAL
  Filled 2019-09-25 (×2): qty 1

## 2019-09-25 MED ORDER — INSULIN NPH (HUMAN) (ISOPHANE) 100 UNIT/ML ~~LOC~~ SUSP
6.0000 [IU] | Freq: Two times a day (BID) | SUBCUTANEOUS | Status: DC
  Administered 2019-09-25 – 2019-09-26 (×3): 6 [IU] via SUBCUTANEOUS
  Filled 2019-09-25: qty 10

## 2019-09-25 MED ORDER — ASPIRIN 325 MG PO TABS
325.0000 mg | ORAL_TABLET | Freq: Every day | ORAL | Status: DC
  Administered 2019-09-25 – 2019-09-26 (×2): 325 mg via ORAL
  Filled 2019-09-25 (×2): qty 1

## 2019-09-25 MED ORDER — POTASSIUM CHLORIDE CRYS ER 20 MEQ PO TBCR
40.0000 meq | EXTENDED_RELEASE_TABLET | Freq: Once | ORAL | Status: AC
  Administered 2019-09-25: 40 meq via ORAL
  Filled 2019-09-25: qty 2

## 2019-09-25 NOTE — Progress Notes (Signed)
PHARMACY - PHYSICIAN COMMUNICATION CRITICAL VALUE ALERT - BLOOD CULTURE IDENTIFICATION (BCID)  George [name] is an 33 y.o. male who presented to [facility] on 09/23/2019 with a chief complaint of DKA  9/25 Blood: gram positive rods, rapid cultures aren't conducted on GPRs  Assessment:  WBC was elevated on admission but thought to be reactive secondary to DKA. It has came down nicely (29.6>>22.2>>14.3) without any anti-biotic treatment.   Name of physician (or Provider) Contacted: Dr. Florene Glen  Current antibiotics: None  Changes to prescribed antibiotics recommended:  No changes Likely contaminant   Narda Bonds, PharmD, BCPS Clinical Pharmacist Phone: [phone]

## 2019-09-25 NOTE — Progress Notes (Signed)
PROGRESS NOTE    [facility]  MZ:[id number] DOB: 1986/11/23 DOA: 09/23/2019 PCP: George Stain, MD   Brief Narrative: George [name] is George [name] 33 y.o. male with medical history significant of type 1 diabetes, hypertension, recent CVA in June with combined receptive and expressive aphasia, CKD stage III who presented here with altered mental status and reportedly has been having nausea and persistent vomiting since Wednesday.  Reportedly from EDP patient was initially able to give some history but acutely became more agitated and was pulling out his IV.  He was tachycardic up to 160s and had improvement of heart rate down to 120s with 10 mg of verapamil.  He was also tachypneic but remained on room air.  Patient was restless and agitated at the time my evaluation and per nursing report he had attempted to use his foot to remove his hand restraints.  He repeatedly stated "please help me," but was unable to explain any of his symptoms to me.  He was pan positive and replied yes to every review of systems questions.  Also stating he has pain everywhere.  When asked for his full name and where he was he only stated "plus."  He was afebrile and hypertensive up to 170s/108. In sinus tachycardia up to 160 and mildly tachypneic on room air.  CBC showed leukocytosis of 29.6 and no anemia.  CMP showed glucose level 532 with bicarb of 18 and anion gap of 24.  pH was mildly alkalotic at 7.59.  Lactic acid of 5 and then 3.2 following IV fluids.  TSH normal.  COVID testing negative.  CT head shows 2 or 3 rounded oval regions of low attenuation in the cerebral hemisphere not seen on previous studies.  Suggest could be small interval lacunar infarcts with the possibility of embolic disease.  Findings were age-indeterminate.  CT abdomen showed no acute findings.  There is gallbladder sludge or stones but no findings of acute cholecystitis.  Assessment & Plan:   Active Problems:   History of CVA (cerebrovascular  accident)   AKI (acute kidney injury) ([place])   DKA (diabetic ketoacidoses) ([place])  Diabetic ketoacidosis   T1DM   Hypoglycemia - Unclear precipitating cause.   - He's had borderline temps, tmax of 100.2 since admission, but clinically appears to be improving and UA and CXR not suggestive of infection - follow blood cx and follow urine cx (urine did have WBC, but negative LE/nitrites) - hold off on abx for now - DKA now resolved, AG 13, bicarb 22, BG173 - resume home insulin at reduced dose 6 units NPH with 3 units at meals and SSI (episode of hypoglycemia this AM, will reduce NPH, but expect he'll probably need closer to his home dose as he's improved today and will likely eat more) - Diabetic coordinator - A1c is 7.3 - check HbA1C  Altered mental status/delirium - delirium precautions - utox positive for MJ - resolved  Acute Infarct R Parietal Cortex   Subacute Infarct R Cerebellum   Hx CVA: - Hx L PCA infarct in June - Was on ASA/plavix and supposed to stop plavix and continue ASA alone after 9/19 (admitting provider started on plavix - have added aspirin, will defer final plan to neuro) - Thought 2/2 cardioembolic source -> they'd planned for 30 day event monitor as echo and TEE were negative for PFO or idenifiable cardiac source of embolus and hypercoagulable/vasculitis w/u was negative per recent neuro note - will reconsult neurology in setting of acute stroke, appreciate  recs - pending  Hypertension   Tachycardia Significantly elevated BP's this AM His clonidine and coreg were being held -> which likely contributed to rebound hypertension and tachycardia Resume home doses of clonidine and carvedilol EKG  with sinus tachycardia  Acute kidney injury -Creatinine of 4.11 on admission - improved to 3.07 today - renal US without hydro, distended bladder noted (per nursing 9/26, bladder scan showed around 500 cc, then he voided as much) - creatinine improving  -Continue to monitor  serial BMP -Avoid nephrotoxic agent  Leukocytosis: improving, follow, follow cx - given sludge vs stone in gallbladder on imaging, will follow RUQ [place] as he did have N/V on presentation  Gram Positive Rods in 1/2 Cx: suspect contaminant, improving off abx - follow repeat cx   DVT prophylaxis: heparin  Code Status:full  Family Communication: none at bedside - called mother to update Disposition Plan: pending further improvement  Consultants:   neurology  Procedures:   none  Antimicrobials:  Anti-infectives (From admission, onward)   None        Subjective: Feeling better today  Objective: Vitals:   09/25/19 0555 09/25/19 0600 09/25/19 0800 09/25/19 1140  BP:  (!) 177/99 (!) 191/107 (!) 188/121  Pulse: 91 76 91 (!) 113  Resp: 15 19 16 19   Temp:   97.6 F (36.4 C) 98.5 F (36.9 C)  TempSrc:   Oral Oral  SpO2: 100% 99% 100% 99%  Weight:      Height:        Intake/Output Summary (Last 24 hours) at 09/25/2019 1254 Last data filed at 09/25/2019 1222 Gross per 24 hour  Intake 600 ml  Output 5250 ml  Net -4650 ml   Filed Weights   09/23/19 1606 09/23/19 2205  Weight: 59 kg 54.8 kg    Examination:  General: No acute distress. Cardiovascular: Heart sounds show George [name] regular rate, and rhythm. Lungs: Clear to auscultation bilaterally  Abdomen: Soft, nontender, nondistended  Neurological: Alert and oriented 3. Moves all extremities 4. Cranial nerves II through XII grossly intact. Skin: Warm and dry. No rashes or lesions. Extremities: No clubbing or cyanosis. No edema.  Data Reviewed: I have personally reviewed following labs and imaging studies  CBC: Recent Labs  Lab 09/23/19 1601 09/23/19 1609 09/24/19 0809 09/25/19 0402  WBC 29.6*  --  22.2* 14.3*  NEUTROABS 27.1*  --   --   --   HGB 14.4 14.6 13.4 12.9*  HCT 40.4 43.0 38.2* 36.5*  MCV 85.8  --  86.6 88.0  PLT 296  --  246 [id number]   Basic Metabolic Panel: Recent Labs  Lab 09/24/19 0019 09/24/19 0454  09/24/19 0809 09/24/19 1213 09/25/19 0402  NA 136 136 138 138 140  K 3.4* 3.1* 3.3* 3.8 3.2*  CL 99 99 101 103 105  CO2 22 22 22 22 25   GLUCOSE 184* 136* 166* 173* 45*  BUN 51* 49* 47* 47* 34*  CREATININE 3.77* 3.70* 3.70* 3.46* 3.07*  CALCIUM 8.7* 8.7* 8.5* 8.6* 8.5*  MG  --   --   --   --  2.0   GFR: Estimated Creatinine Clearance: 26.5 mL/min (Jaeanna Mccomber) (by C-G formula based on SCr of 3.07 mg/dL (H)). Liver Function Tests: Recent Labs  Lab 09/23/19 1601 09/25/19 0402  AST 28 20  ALT 25 20  ALKPHOS 122 82  BILITOT 1.4* 0.8  PROT 7.1 5.3*  ALBUMIN 4.3 3.2*   No results for input(s): LIPASE, AMYLASE in the last 168 hours. No  results for input(s): AMMONIA in the last 168 hours. Coagulation Profile: No results for input(s): INR, PROTIME in the last 168 hours. Cardiac Enzymes: No results for input(s): CKTOTAL, CKMB, CKMBINDEX, TROPONINI in the last 168 hours. BNP (last 3 results) No results for input(s): PROBNP in the last 8760 hours. HbA1C: Recent Labs    09/24/19 1213  HGBA1C 7.3*   CBG: Recent Labs  Lab 09/24/19 1936 09/25/19 0553 09/25/19 0645 09/25/19 0754 09/25/19 1151  GLUCAP 111* 34* 138* 277* 173*   Lipid Profile: No results for input(s): CHOL, HDL, LDLCALC, TRIG, CHOLHDL, LDLDIRECT in the last 72 hours. Thyroid Function Tests: Recent Labs    09/23/19 1601  TSH 1.268   Anemia Panel: No results for input(s): VITAMINB12, FOLATE, FERRITIN, TIBC, IRON, RETICCTPCT in the last 72 hours. Sepsis Labs: Recent Labs  Lab 09/23/19 1600 09/23/19 1920 09/24/19 0809  LATICACIDVEN 5.0* 3.2* 1.7    Recent Results (from the past 240 hour(s))  Culture, blood (routine x 2)     Status: None (Preliminary result)   Collection Time: 09/23/19  4:01 PM   Specimen: BLOOD  Result Value Ref Range Status   Specimen Description BLOOD RIGHT ANTECUBITAL  Final   Special Requests   Final    BOTTLES DRAWN AEROBIC AND ANAEROBIC Blood Culture adequate volume   Culture  Setup  Time   Final    GRAM POSITIVE RODS ANAEROBIC BOTTLE ONLY CRITICAL RESULT CALLED TO, READ BACK BY AND VERIFIED WITH: J. LEDFORD,PHARMD 0708 09/25/2019 Mena Goes Performed at [facility], [place] [address], [place], [place] [address]    Culture GRAM POSITIVE RODS  Final   Report Status PENDING  Incomplete  Culture, blood (routine x 2)     Status: None (Preliminary result)   Collection Time: 09/23/19  4:09 PM   Specimen: BLOOD RIGHT HAND  Result Value Ref Range Status   Specimen Description BLOOD RIGHT HAND  Final   Special Requests   Final    BOTTLES DRAWN AEROBIC AND ANAEROBIC Blood Culture adequate volume   Culture   Final    NO GROWTH 2 DAYS Performed at [facility], [place] [address] [place], [place] [address]    Report Status PENDING  Incomplete  SARS Coronavirus 2 [facility] order, Performed in [facility] hospital lab) Nasopharyngeal Nasopharyngeal Swab     Status: None   Collection Time: 09/23/19  5:31 PM   Specimen: Nasopharyngeal Swab  Result Value Ref Range Status   SARS Coronavirus 2 NEGATIVE NEGATIVE Final    Comment: (NOTE) If result is NEGATIVE SARS-CoV-2 target nucleic acids are NOT DETECTED. The SARS-CoV-2 RNA is generally detectable in upper and lower  respiratory specimens during the acute phase of infection. The lowest  concentration of SARS-CoV-2 viral copies this assay can detect is 250  copies / mL. Baptiste Littler negative result does not preclude SARS-CoV-2 infection  and should not be used as the sole basis for treatment or other  patient management decisions.  Agustin Swatek negative result may occur with  improper specimen collection / handling, submission of specimen other  than nasopharyngeal swab, presence of viral mutation(s) within the  areas targeted by this assay, and inadequate number of viral copies  (<250 copies / mL). Fiana Gladu negative result must be combined with clinical  observations, patient history, and epidemiological information. If result is  POSITIVE SARS-CoV-2 target nucleic acids are DETECTED. The SARS-CoV-2 RNA is generally detectable in upper and lower  respiratory specimens dur ing the acute phase of  infection.  Positive  results are indicative of active infection with SARS-CoV-2.  Clinical  correlation with patient history and other diagnostic information is  necessary to determine patient infection status.  Positive results do  not rule out bacterial infection or co-infection with other viruses. If result is PRESUMPTIVE POSTIVE SARS-CoV-2 nucleic acids MAY BE PRESENT.   Adonte Vanriper presumptive positive result was obtained on the submitted specimen  and confirmed on repeat testing.  While 2019 novel coronavirus  (SARS-CoV-2) nucleic acids may be present in the submitted sample  additional confirmatory testing may be necessary for epidemiological  and / or clinical management purposes  to differentiate between  SARS-CoV-2 and other Sarbecovirus currently known to infect humans.  If clinically indicated additional testing with an alternate test  methodology [phone]) is advised. The SARS-CoV-2 RNA is generally  detectable in upper and lower respiratory sp ecimens during the acute  phase of infection. The expected result is Negative. Fact Sheet for Patients:  StrictlyIdeas.no Fact Sheet for Healthcare Providers: BankingDealers.co.za This test is not yet approved or cleared by the [place] FDA and has been authorized for detection and/or diagnosis of SARS-CoV-2 by FDA under an Emergency Use Authorization (EUA).  This EUA will remain in effect (meaning this test can be used) for the duration of the COVID-19 declaration under Section 564(b)(1) of the Act, 21 U.S.C. section 360bbb-3(b)(1), unless the authorization is terminated or revoked sooner. Performed at [facility], [employer] [address], [place], [place] [address]   MRSA PCR Screening     Status: None   Collection Time:  09/23/19 11:02 PM   Specimen: Nasal Mucosa; Nasopharyngeal  Result Value Ref Range Status   MRSA by PCR NEGATIVE NEGATIVE Final    Comment:        The GeneXpert MRSA Assay (FDA approved for NASAL specimens only), is one component of Analaura Messler comprehensive MRSA colonization surveillance program. It is not intended to diagnose MRSA infection nor to guide or monitor treatment for MRSA infections. Performed at [facility], [place] [address], [place], [place] [address]   Culture, Urine     Status: None   Collection Time: 09/24/19  7:37 AM   Specimen: Urine, Random  Result Value Ref Range Status   Specimen Description URINE, RANDOM  Final   Special Requests NONE  Final   Culture   Final    NO GROWTH Performed at [facility], [place] [address], [place], [place] [address]    Report Status 09/25/2019 FINAL  Final         Radiology Studies: Ct Abdomen Pelvis Wo Contrast  Result Date: 09/23/2019 CLINICAL DATA:  Nausea and vomiting since Wednesday. EXAM: CT ABDOMEN AND PELVIS WITHOUT CONTRAST TECHNIQUE: Multidetector CT imaging of the abdomen and pelvis was performed following the standard protocol without IV contrast. COMPARISON:  None. FINDINGS: Lower chest: The lung bases are clear of an acute process. No pleural effusions. The heart is normal in size. No pericardial effusion. Mild bilateral gynecomastia noted. Hepatobiliary: No focal hepatic lesions are identified without contrast. The gallbladder contains high attenuation material which could be sludge or stones. I do not see any obvious findings for acute cholecystitis but right upper quadrant ultrasound examination may be helpful for further evaluation. No common bile duct dilatation. Pancreas: No mass, inflammation or ductal dilatation. Spleen: Normal size.  No focal lesions. Adrenals/Urinary Tract: The adrenal glands and kidneys are unremarkable. No renal, ureteral or bladder calculi or obvious mass. Stomach/Bowel: The stomach,  duodenum, small bowel and colon are grossly unremarkable without oral contrast. No acute inflammatory changes, mass lesions or obstructive findings. The appendix is opacified possibly related to Naela Nodal prior contrasted study. No inflammation to suggest appendicitis. Vascular/Lymphatic: The aorta is normal in caliber. No atheroscerlotic calcifications. No mesenteric of retroperitoneal mass or adenopathy. Small scattered lymph nodes are noted. Reproductive: The prostate gland and seminal vesicles are unremarkable. Other: No pelvic mass or adenopathy. No free pelvic fluid collections. No inguinal mass or adenopathy. No abdominal wall hernia or subcutaneous lesions. Musculoskeletal: No significant bony findings. IMPRESSION: 1. No acute abdominal/pelvic findings, mass lesions or adenopathy. 2. No renal, ureteral or bladder calculi or mass. 3. High attenuation material in the gallbladder could be sludge or stones. No obvious CT findings for acute cholecystitis. Right upper quadrant ultrasound examination may be helpful for further evaluation. Electronically Signed   By: Marijo Sanes M.D.   On: 09/23/2019 18:51   Ct Head Wo Contrast  Result Date: 09/23/2019 CLINICAL DATA:  Altered level of consciousness. EXAM: CT HEAD WITHOUT CONTRAST TECHNIQUE: Contiguous axial images were obtained from the base of the skull through the vertex without intravenous contrast. COMPARISON:  June 12, 2019 FINDINGS: Brain: No subdural, epidural, or subarachnoid hemorrhage 3 tiny rounded regions of low attenuation are seen in the cerebellum with 2 on the right and 1 on the left as seen on series 3, images 12 and 13. These findings were not seen previously. The basal cisterns are patent. This the brainstem is normal. The ventricles are stable. The left Nezzie Manera separate all and temporal lobe infarct seen on the previous study are again noted with decreasing attenuation/encephalomalacia. No other sites of interval ischemia or infarct. No mass effect or  midline shift. Vascular: No hyperdense vessel or unexpected calcification. Skull: Normal. Negative for fracture or focal lesion. Sinuses/Orbits: No acute finding. Other: None. IMPRESSION: 1. Two or 3 rounded/oval regions of low attenuation in the cerebellar hemispheres as described above, not seen on the previous study. These could represent small interval lacunar infarcts. The multiplicity raises the possibility of embolic disease. The findings are otherwise age indeterminate. An MRI could further assess and determine acuity. 2. No other acute abnormalities are noted. Electronically Signed   By: Dorise Bullion III M.D   On: 09/23/2019 18:54   Mr Brain Wo Contrast  Result Date: 09/24/2019 CLINICAL DATA:  Rule out acute infarct. EXAM: MRI HEAD WITHOUT CONTRAST TECHNIQUE: Multiplanar, multiecho pulse sequences of the brain and surrounding structures were obtained without intravenous contrast. COMPARISON:  CT head 09/23/2019.  MRI head 06/14/2019 FINDINGS: Brain: Small focus of restricted diffusion in the right parietal cortex over the convexity compatible with acute infarct. 5 mm. Facilitated diffusion right cerebellar hemisphere. Associated T2 and FLAIR hyperintensity. Probable subacute, recent infarction. This was not present on the prior MRI. Chronic infarct left cerebellum, not present on the prior MRI. Chronic left PCA infarct involving the left occipital lobe. This shows evidence of chronic mild hemorrhage. Ventricle size normal. Vascular: Normal arterial flow voids. Skull and upper cervical spine: Negative Sinuses/Orbits: Negative Other: None IMPRESSION: 1. 5 mm acute infarct right parietal cortex over the convexity 2. Small area of subacute infarct right cerebellum. Chronic infarct left cerebellum 3. Chronic hemorrhagic infarct left occipital lobe. Electronically Signed   By: Franchot Gallo M.D.   On: 09/24/2019 12:03   US Renal  Result Date: 09/24/2019 CLINICAL DATA:  Acute kidney injury EXAM: RENAL  / URINARY TRACT ULTRASOUND COMPLETE COMPARISON:  None. FINDINGS: Right Kidney:  Renal measurements: 11.1 x 4.9 x 6.2 cm = volume: 172 mL . Echogenicity within normal limits. No mass or hydronephrosis visualized. Left Kidney: Renal measurements: 10.8 x 5.3 x 5.5 cm = volume: There is 165 mL. Echogenicity within normal limits. No mass or hydronephrosis visualized. Bladder: Distended bladder. IMPRESSION: 1. No significant renal abnormality. 2. Distended bladder. Electronically Signed   By: Suzy Bouchard M.D.   On: 09/24/2019 13:39   Dg Chest Port 1 View  Result Date: 09/23/2019 CLINICAL DATA:  Hyperglycemia.  Low-grade fever. EXAM: PORTABLE CHEST 1 VIEW COMPARISON:  June 15, 2019 FINDINGS: The heart size and mediastinal contours are within normal limits. Both lungs are clear. The visualized skeletal structures are unremarkable. IMPRESSION: No active disease. Electronically Signed   By: Dorise Bullion III M.D   On: 09/23/2019 16:33        Scheduled Meds:  aspirin  325 mg Oral Daily   carvedilol  12.5 mg Oral BID WC   cloNIDine  0.1 mg Oral BID   clopidogrel  75 mg Oral Daily   heparin  5,000 Units Subcutaneous Q8H   insulin aspart  0-5 Units Subcutaneous QHS   insulin aspart  0-9 Units Subcutaneous TID WC   insulin aspart  3 Units Subcutaneous TID WC   insulin NPH Human  6 Units Subcutaneous BID AC & HS   Continuous Infusions:  insulin Stopped (09/24/19 1206)   lactated ringers 100 mL/hr at 09/24/19 1628     LOS: 2 days    Time spent: over 30 min    Fayrene Helper, MD [facility] Pager AMION  If 7PM-7AM, please contact night-coverage [email or web address] Password [facility] 09/25/2019, 12:54 PM

## 2019-09-25 NOTE — Evaluation (Signed)
Occupational Therapy Evaluation Patient Details Name: Teagon Gentil MRN: [id number] DOB: 09-Dec-1986 Today's Date: 09/25/2019    History of Present Illness Billyray Grudzien is a 33 y.o. 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 PMHx: type 1 diabetes, hypertension, recent CVA in June with combined receptive and expressive aphasia, CKD stage III.   Clinical Impression   Pt PTA: pt independence with ADL and mobility. Pt currently with R visual field blurriness and LUE weakness. Pt with R sided visual deficits stating "feels like I have a film over my eye." This results in decreased ability to scan environment and inability to read clearly. Pt reports this is new; otherwise, pt is independent to [place] level for ADL. Pt modified independence for mobility. OT to focus on LUE strength and R visual compensatory strategies if deficits persist. Recommend that pt see a diabetic vision specialist upon D/C. OT following acutely.      Follow Up Recommendations  No OT follow up;Supervision - Intermittent    Equipment Recommendations  None recommended by OT    Recommendations for Other Services       Precautions / Restrictions Precautions Precautions: None Restrictions Weight Bearing Restrictions: No      Mobility Bed Mobility Overal bed mobility: Independent                Transfers Overall transfer level: Independent                    Balance Overall balance assessment: Needs assistance Sitting-balance support: No upper extremity supported;Feet supported Sitting balance-Leahy Scale: Fair     Standing balance support: No upper extremity supported;During functional activity Standing balance-Leahy Scale: Fair Standing balance comment: can stand statically and maintain balance and min guard with dynamic activities.                            ADL either performed or assessed with clinical  judgement   ADL Overall ADL's : Needs assistance/impaired Eating/Feeding: Modified independent   Grooming: Supervision/safety   Upper Body Bathing: Modified independent   Lower Body Bathing: Supervison/ safety   Upper Body Dressing : Modified independent   Lower Body Dressing: Supervision/safety   Toilet Transfer: Supervision/safety   Toileting- Clothing Manipulation and Hygiene: Supervision/safety       Functional mobility during ADLs: Modified independent General ADL Comments: Pt with R sided visual deficits resulting in decreased ability to scan environment and inability to read clearly. Pt reports this is new; otherwise, pt is independent to [place] level for ADL.      Vision Baseline Vision/History: Wears glasses Wears Glasses: At all times Patient Visual Report: Blurring of vision;Peripheral vision impairment Vision Assessment?: Yes;Vision impaired- to be further tested in functional context Eye Alignment: Within Functional Limits Ocular Range of Motion: Within Functional Limits Alignment/Gaze Preference: Within Defined Limits Tracking/Visual Pursuits: Able to track stimulus in all quads without difficulty Additional Comments: Pt having difficulty with blurring of vision, peripheral vision is WFLs.     Perception     Praxis      Pertinent Vitals/Pain Pain Assessment: No/denies pain     Hand Dominance Right   Extremity/Trunk Assessment Upper Extremity Assessment Upper Extremity Assessment: Generalized weakness LUE Deficits / Details: 3+/5 MM grade compared to 4-/5 MM gradc in RUE LUE Coordination: WNL   Lower Extremity Assessment Lower Extremity Assessment: Overall WFL for tasks assessed   Cervical / Trunk  Assessment Cervical / Trunk Assessment: Normal   Communication Communication Communication: No difficulties   Cognition Arousal/Alertness: Awake/alert Behavior During Therapy: WFL for tasks assessed/performed Overall Cognitive Status: Within  Functional Limits for tasks assessed                                     General Comments  performing grooming at sink with supervisionA    Exercises     Shoulder Instructions      Home Living Family/patient expects to be discharged to:: Private residence Living Arrangements: Parent Available Help at Discharge: Family;Available PRN/intermittently Type of Home: House Home Access: Stairs to enter [employer] of Steps: 2   Home Layout: One level     Bathroom Shower/Tub: Teacher, early years/pre: Standard     Home Equipment: Cane - single point          Prior Functioning/Environment Level of Independence: Independent with assistive device(s)        Comments: SPC  occasionally        OT Problem List: Decreased strength;Decreased activity tolerance;Impaired balance (sitting and/or standing);Decreased safety awareness;Pain      OT Treatment/Interventions: Self-care/ADL training;Therapeutic exercise;Neuromuscular education;Energy conservation;Therapeutic activities;Patient/family education;Balance training    OT Goals(Current goals can be found in the care plan section) Acute Rehab OT Goals Patient Stated Goal: to go home OT Goal Formulation: With patient Time For Goal Achievement: 10/09/19 Potential to Achieve Goals: Good ADL Goals Pt Will Perform Grooming: with modified independence;standing Pt/caregiver will Perform Home Exercise Program: Left upper extremity;With written HEP provided;Increased strength Additional ADL Goal #1: Pt will state compensatory strategies for visual field deficits in R eye after education provided.  OT Frequency: Min 2X/week   Barriers to D/C:            Co-evaluation              AM-PAC OT "6 Clicks" Daily Activity     Outcome Measure Help from another person eating meals?: A Little Help from another person taking care of personal grooming?: A Little Help from another person toileting,  which includes using toliet, bedpan, or urinal?: None Help from another person bathing (including washing, rinsing, drying)?: A Little Help from another person to put on and taking off regular upper body clothing?: None Help from another person to put on and taking off regular lower body clothing?: None 6 Click Score: 21   End of Session Equipment Utilized During Treatment: Gait belt Nurse Communication: Mobility status  Activity Tolerance: Patient tolerated treatment well Patient left: in chair;with call bell/phone within reach  OT Visit Diagnosis: Unsteadiness on feet (R26.81);Muscle weakness (generalized) (M62.81)                Time: 0933-1000 OT Time Calculation (min): 27 min Charges:  OT General Charges $OT Visit: 1 Visit OT Evaluation $OT Eval Moderate Complexity: 1 Mod  Darryl Nestle) Marsa Aris OTR/L Acute Rehabilitation Services Pager: [phone] Office: [phone]   Jenene Slicker Nelsie Domino 09/25/2019, 1:15 PM

## 2019-09-25 NOTE — Evaluation (Signed)
Physical Therapy Evaluation Patient Details Name: George [name] MRN: [id number] DOB: 1986-10-17 Today's Date: 09/25/2019   History of Present Illness  George [name] is a 33 y.o. 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 PMHx: type 1 diabetes, hypertension, recent CVA in June with combined receptive and expressive aphasia, CKD stage III.  Clinical Impression  Pt admitted with above diagnosis. Pt was able to ambulate with min guard to supervision without device. No LOB with min challenges.  Pt is not back to baseline per pt therefore if pt stays in hospital another day will check back and continue to work on balance.   Pt currently with functional limitations due to the deficits listed below (see PT Problem List). Pt will benefit from skilled PT to increase their independence and safety with mobility to allow discharge to the venue listed below.      Follow Up Recommendations No PT follow up    Equipment Recommendations  None recommended by PT    Recommendations for Other Services       Precautions / Restrictions Precautions Precautions: None Restrictions Weight Bearing Restrictions: No      Mobility  Bed Mobility Overal bed mobility: Independent                Transfers Overall transfer level: Independent                  Ambulation/Gait Ambulation/Gait assistance: Supervision;Min guard Gait Distance (Feet): 450 Feet Assistive device: None Gait Pattern/deviations: Step-through pattern;Decreased stride length   Gait velocity interpretation: 1.31 - 2.62 ft/sec, indicative of limited community ambulator General Gait Details: Pt doing well overall. Reports he has felt off balance at times however no LOB with PT this am.  Pt also did well with visual scanning of environment.    Stairs Stairs: Yes Stairs assistance: Modified independent (Device/Increase time) Stair Management: No rails;Forwards Number  of Stairs: 3 General stair comments: No issues going up and down stairs.   Wheelchair Mobility    Modified Rankin (Stroke Patients Only)       Balance Overall balance assessment: Needs assistance Sitting-balance support: No upper extremity supported;Feet supported Sitting balance-Leahy Scale: Fair     Standing balance support: No upper extremity supported;During functional activity Standing balance-Leahy Scale: Fair Standing balance comment: can stand statically and maintain balance and min guard with dynamic activities.                  Standardized Balance Assessment Standardized Balance Assessment : Dynamic Gait Index   Dynamic Gait Index Level Surface: Normal Change in Gait Speed: Normal Gait with Horizontal Head Turns: Mild Impairment Gait with Vertical Head Turns: Mild Impairment Gait and Pivot Turn: Normal Step Over Obstacle: Normal Step Around Obstacles: Normal Steps: Mild Impairment Total Score: 21       Pertinent Vitals/Pain Pain Assessment: No/denies pain    Home Living Family/patient expects to be discharged to:: Private residence Living Arrangements: Parent Available Help at Discharge: Family;Available PRN/intermittently Type of Home: House Home Access: Stairs to enter   [employer] of Steps: 2 Home Layout: One level Home Equipment: Cane - single point      Prior Function Level of Independence: Independent with assistive device(s)         Comments: SPC  occasionally     Hand Dominance   Dominant Hand: Right    Extremity/Trunk Assessment   Upper Extremity Assessment Upper Extremity Assessment: Generalized weakness;LUE deficits/detail LUE  Deficits / Details: 3+/5 MM grade compared to 4-/5 MM gradc in RUE LUE Coordination: WNL    Lower Extremity Assessment Lower Extremity Assessment: Overall WFL for tasks assessed    Cervical / Trunk Assessment Cervical / Trunk Assessment: Normal  Communication   Communication:  No difficulties  Cognition Arousal/Alertness: Awake/alert Behavior During Therapy: WFL for tasks assessed/performed Overall Cognitive Status: Within Functional Limits for tasks assessed                                        General Comments General comments (skin integrity, edema, etc.): Scored well on DGI suggesting low risk of falls    Exercises     Assessment/Plan    PT Assessment Patient needs continued PT services  PT Problem List Decreased balance;Decreased activity tolerance;Decreased mobility;Decreased knowledge of use of DME;Decreased safety awareness;Decreased knowledge of precautions;Cardiopulmonary status limiting activity       PT Treatment Interventions DME instruction;Gait training;Functional mobility training;Therapeutic activities;Therapeutic exercise;Balance training;Patient/family education    PT Goals (Current goals can be found in the Care Plan section)  Acute Rehab PT Goals Patient Stated Goal: to go home PT Goal Formulation: With patient Time For Goal Achievement: 10/09/19 Potential to Achieve Goals: Good    Frequency Min 3X/week   Barriers to discharge        Co-evaluation               AM-PAC PT "6 Clicks" Mobility  Outcome Measure Help needed turning from your back to your side while in a flat bed without using bedrails?: None Help needed moving from lying on your back to sitting on the side of a flat bed without using bedrails?: None Help needed moving to and from a bed to a chair (including a wheelchair)?: None Help needed standing up from a chair using your arms (e.g., wheelchair or bedside chair)?: None Help needed to walk in hospital room?: A Little Help needed climbing 3-5 steps with a railing? : A Little 6 Click Score: 22    End of Session Equipment Utilized During Treatment: Gait belt Activity Tolerance: Patient tolerated treatment well Patient left: in chair;with call bell/phone within reach Nurse  Communication: Mobility status PT Visit Diagnosis: Muscle weakness (generalized) (M62.81)    Time: [id number] PT Time Calculation (min) (ACUTE ONLY): 16 min   Charges:   PT Evaluation $PT Eval Moderate Complexity: [place] Pager:  [phone]  Office:  [phone]    Denice Paradise 09/25/2019, 12:20 PM

## 2019-09-25 NOTE — Progress Notes (Signed)
At 555 am pt stated to writer that he felt as though his blood sugar was low, pt's blood was 34mg /dl when checked pt was given three glasses or orange juice sweetened with sugar and 3 containers of peanut butter. On Call NP notified, repeat blood sugar at 0645am was 138 mg/dl. Pt stated, "I feel better I'm ready to go home". Pt slept well, 1:1 care with Tech and Nurse, wrist restraints removed, as patient agreed he would not attempt to pull out any lines. Telesitter remained in place. Pt cooperative and in good spirits after am bath and ordering breakfast will continue to monitor. Pt care reassigned to oncoming nurse Minette Headland RN.

## 2019-09-25 NOTE — Progress Notes (Signed)
SLP Cancellation Note  Patient Details Name: George [name] MRN: [id number] DOB: Sep 12, 1986   Cancelled treatment:       Reason Eval/Treat Not Completed: SLP screened, no needs identified, will sign off.  Pt consuming regular thin diet without difficulty. Feels speech issues have resolved but he also has ongoing OP SLP appointments. Will sign off acutely.    Latroya Ng, Katherene Ponto 09/25/2019, 12:46 PM

## 2019-09-25 NOTE — Progress Notes (Signed)
STROKE TEAM PROGRESS NOTE   HISTORY OF PRESENT ILLNESS (per record) Musa Marzo is an 33 y.o. male with a medical history significant of type 1 diabetes, hypertension, CKD Stage III. On 06/02/19 he had a large left PCA ischemic infarct resulting in decreased right visual field acuity and word finding difficulty. Etiology was felt to be likely a cardioembolic source. MRA showed severe stenosis of his L PCA. Carotid dopplers were unremarkable. Echo EF 50-55%. No evidence of DVT. Hypercoagulable/vasculitis work-up negative. Other stroke risk factors include ETOH use, THC use and tobacco use. TEE was recommended and completed on 6/22. The TEE found no PFO, no cardiac source for embolism identified. Recommendations were to consider CardioNet monitoring if TEE work-up was unrevealing. During his June hospital admission he was also treated for agitation, hypertension and he had DKA treated with an insulin drip. For his stroke he was placed on dual antiplatelet for 2 months and then ASA alone was recommended. He was also discharged on Atorvastatin. He was advised to schedule a follow-up with [facility] in 4 weeks.   He was seen by Dr. Leonie Man and his nurse practitioner in clinic on 07/26/19 at [facility]. At that time the patient was reporting expressive aphasia and delayed recall, however they were unable to appreciate speech difficulty on exam. His recent and remote memory were also found to be intact on examination. He described his word finding as only occasional word finding difficulty. It was reported that he initially had trouble with video games, but that had improved. He had not started therapy because he was waiting on insurance. It was reported that he lives at home with his mother. Recommendations were to Continue aspirin 81mg  and to stop Plavix on 09/17/19 and continue Aspirin alone. Dr. Leonie Man felt that the MRA head showed a likely occlusion of the left vertebral artery and not stenosis,  further suggesting cardioembolic source. A 30 day cardiac event monitor was recommended.   The patient is followed by Dr. Asencion Noble and he has been managing his hypertension,  Diabetes and CKD. Last office visit with Dr. Joya Gaskins was on 09/19/19  The patient presented yesterday via EMS for evaluation of nausea and vomiting. He complained of abdominal pain upon admission. CT Abdomen/Pelvis had no acute findings. He denied and drug or alcohol use. He is reported to have been cooperative upon admission, but then became agitated and pulled out his IV. Restraint use was required. According to his H&P he responded "yes" to all ROS questions and repeatedly stated "pelase help me". He was noted to be tachycardiac. COVID test negative. He was admitted for diabetic ketoacidosis on an insulin drip, altered mental status, AKI and history of CVA  CT yesterday on 09/24/19 found 2-3 areas of low attenuation in the cerebellar hemispheres, possible interval lacunar infarcts, raising the possibility of embolic disease. MRI was recommended. MRI of the brain today identified a 60mm acute infarct right parietal cortex over the convexity. Small area of subacute infarct right cerebellum. Chronic infarct left cerebellum. Chronic hemorraghic infarct left occipital lobe is mentioned, he has a known ischemic infarct with hemorraghic transformation from June, however other chronic infarcts were not reported at that time.  After he was admitted in June of this year with a stroke with full work up including TEE performed at that time, he followed up in the stroke clinic in late July. Summary of work up from [facility] stroke clinic note follows: "Arlow Sowinskiis being seen today for in office hospital follow-up [place]  PICA infarct embolic pattern secondary to large vessel disease versus cardioembolic source on [id number]. History obtained from patient, motherand chart review. Reviewed all radiology images and labs personally.  Mr.Hale Sowinskiis a 32 y.o.malewith history of type I DBpresented on 6/14/2020with receptive aphasia and word salad responses, not oriented with HA.Stroke work-up revealed large left PCA infarct embolic pattern secondary to large vessel disease versus cardioembolic source. Initial CT head showed large left PCA infarct. MRI brain showed large left PCA infarct with petechial hemorrhage left occipital lobe HI 1.Carotid Doppler and 2D echo unremarkable. Lower extremity venous Dopplers negative for DVT. TEE did not show evidence of PFO or cardiac source of embolus. Recommended 30-day cardiac event monitor outpatient to assess for atrial fibrillation. Hypercoagulable/vasculitis work-up negative. LDL 102 and A1c 8.4. Recommended DAPT for 3 months then aspirin alone due to intracranial stenosis. No prior history of HTN with elevation during admission and started on amlodipine 10 mg and metoprolol 50 mg twice daily. Also found to have CKD, stage III with referral to nephrology. Initiated atorvastatin 40 mg daily. Advised to follow-up with PCP for uncontrolled DM. Initial finding of leukocytosis improved during admission with work-up unremarkable and likely reactive. Other stroke risk factors include EtOH use, THC use and tobacco use. He was discharged home in stable condition with residual aphasia. Residual deficitsexpressive aphasia and delayed recall.He does endorse overall improvement with only occasional word finding difficulty.He is currently in the process of obtaining insurance therefore therapies were not started. Has returned to playing video games with improvement - was having difficulty initially He continues to live at home with his mother but is able to perform all prior activities He does have complaints of swelling bilateral lower extremity swelling - started once he started his new prescriptions; he does have appointment next Monday with Nephrologyfor initial evaluation  due to elevated creatinine and CKD stage III. Continues on aspirin and Plavix without bleeding or bruising. Continues on atorvastatin without myalgias-recent lipid panel by PCP LDL 71 Blood pressure stable at 139/83. No further concerns at this time."   INTERVAL HISTORY He was not feeling well this past week, no weakness or numbness he came in for nausea. Today the right eye seems blurrier. He has seen Dr. Leonie Man in the past.He is on Plavix and aspirin and endorses compliance but may have missed several doses due to nausea and vomiting. Had a TEE but never had a loop. Discussed loop.     OBJECTIVE Vitals:   09/25/19 0600 09/25/19 0800 09/25/19 1140 09/25/19 1552  BP: (!) 177/99 (!) 191/107 (!) 188/121   Pulse: 76 91 (!) 113   Resp: 19 16 19    Temp:  97.6 F (36.4 C) 98.5 F (36.9 C) 99.1 F (37.3 C)  TempSrc:  Oral Oral Oral  SpO2: 99% 100% 99%   Weight:      Height:        CBC:  Recent Labs  Lab 09/23/19 1601  09/24/19 0809 09/25/19 0402  WBC 29.6*  --  22.2* 14.3*  NEUTROABS 27.1*  --   --   --   HGB 14.4   < > 13.4 12.9*  HCT 40.4   < > 38.2* 36.5*  MCV 85.8  --  86.6 88.0  PLT 296  --  246 223   < > = values in this interval not displayed.    Basic Metabolic Panel:  Recent Labs  Lab 09/24/19 1213 09/25/19 0402  NA 138 140  K 3.8 3.2*  CL 103  105  CO2 22 25  GLUCOSE 173* 45*  BUN 47* 34*  CREATININE 3.46* 3.07*  CALCIUM 8.6* 8.5*  MG  --  2.0    Lipid Panel:     Component Value Date/Time   CHOL 156 06/27/2019 1033   TRIG 46 06/27/2019 1033   HDL 76 06/27/2019 1033   CHOLHDL 2.1 06/27/2019 1033   CHOLHDL 4.1 06/13/2019 0251   VLDL 60 (H) 06/13/2019 0251   LDLCALC 71 06/27/2019 1033   HgbA1c:  Lab Results  Component Value Date   HGBA1C 7.3 (H) 09/24/2019   Urine Drug Screen:     Component Value Date/Time   LABOPIA NONE DETECTED 09/23/2019 2056   COCAINSCRNUR NONE DETECTED 09/23/2019 2056   LABBENZ NONE DETECTED 09/23/2019 2056   AMPHETMU  NONE DETECTED 09/23/2019 2056   THCU POSITIVE (A) 09/23/2019 2056   LABBARB NONE DETECTED 09/23/2019 2056    Alcohol Level     Component Value Date/Time   ETH <10 09/23/2019 2155    IMAGING  Ct Abdomen Pelvis Wo Contrast 09/23/2019 IMPRESSION: 1. No acute abdominal/pelvic findings, mass lesions or adenopathy.  2. No renal, ureteral or bladder calculi or mass.  3. High attenuation material in the gallbladder could be sludge or stones. No obvious CT findings for acute cholecystitis. Right upper quadrant ultrasound examination may be helpful for further evaluation.   Ct Head Wo Contrast 09/23/2019 IMPRESSION:  1. Two or 3 rounded/oval regions of low attenuation in the cerebellar hemispheres as described above, not seen on the previous study. These could represent small interval lacunar infarcts. The multiplicity raises the possibility of embolic disease. The findings are otherwise age indeterminate. An MRI could further assess and determine acuity.  2. No other acute abnormalities are noted.    Mr Brain Wo Contrast 09/24/2019 IMPRESSION:  1. 5 mm acute infarct right parietal cortex over the convexity  2. Small area of subacute infarct right cerebellum. Chronic infarct left cerebellum  3. Chronic hemorrhagic infarct left occipital lobe.   US Renal 09/24/2019 IMPRESSION:  1. No significant renal abnormality.  2. Distended bladder.    Dg Chest Port 1 View 09/23/2019 MPRESSION:  No active disease.    TEE 06/20/2019 IMPRESSIONS  1. The cavity size was normal. There is mildly increased left ventricular wall thickness.  2. LA, LAA without masses.  3. No PFO by color doppler or as tested with injection of agitated saline.  4. The aortic valve is tricuspid.  5. The aortic root, ascending aorta, aortic arch and descending aorta are normal in size and structure.   ECG - ST rate 110  BPM. (See cardiology reading for complete details)   PHYSICAL EXAM Blood pressure (!) 188/121,  pulse (!) 113, temperature 99.1 F (37.3 C), temperature source Oral, resp. rate 19, height 5\' 3"  (1.6 m), weight 54.8 kg, SpO2 99 %.   Exam: NAD, pleasant                  Speech:    Speech is normal; fluent and spontaneous with normal comprehension.  Cognition:    The patient is oriented to person, place, and time;     recent and remote memory intact;     language fluent;    Cranial Nerves:    The pupils are equal, round, and reactive to light.Trigeminal sensation is intact and the muscles of mastication are normal. The face is symmetric. The palate elevates in the midline. Hearing intact. Voice is normal. Shoulder shrug is normal. The  tongue has normal motion without fasciculations.   Coordination:  No dysmetria  Motor Observation:    No asymmetry, no atrophy, and no involuntary movements noted. Tone:    Normal muscle tone.     Strength:    Strength is V/V in the upper and lower limbs.      Sensation: intact to LT    ASSESSMENT/PLAN Mr. Krystopher Shortsleeve is a 33 y.o. male with history of stroke, AKI, SVT and DM presenting with AMS, DKA and agitation.   Stroke: multiple bilateral infarcts - embolic - source unknown  Resultant  No deficits  Code Stroke CT Head - not ordered  CT head - Two or 3 rounded/oval regions of low attenuation in the cerebellar hemispheres as described above, not seen on the previous study. These could represent small interval lacunar infarcts.  MRI head -  5 mm acute infarct right parietal cortex over the convexity. Small area of subacute infarct right cerebellum. Chronic infarct left cerebellum. Chronic hemorrhagic infarct left occipital lobe.   MRA head : see 06/14/2019  CTA H&N : see MRA head and carotid dopplers 06/13/2019  CT Perfusion: not performed  Carotid Doppler - see 06/13/2019  2D Echo - June 2020 - 50-55%. Cannot exclude PFO though no definite L to R shunt detected. Consider transthoracic agitated saline exam ->TEE did not show  evidence of PFO or cardiac source of embolus.  [employer] Virus 2 - not found  LDL - 71  HgbA1c - 7.3  UDS - positive for THCU  VTE prophylaxis - [place] Heparin Diet  Diet Order            Diet Carb Modified Fluid consistency: Thin; Room service appropriate? Yes  Diet effective now              aspirin 325 mg daily and clopidogrel 75 mg daily prior to admission, now on aspirin 325 mg daily and clopidogrel 75 mg daily continue DUAP for 3 weeks then Plavix alone  Patient counseled to be compliant with his antithrombotic medications  Ongoing aggressive stroke risk factor management  Therapy recommendations:  pending  Disposition:  Pending  Hypertension  Home BP meds: Norvasc, Coreg, Catapres  Current BP meds: Coreg, Catapres  Blood pressure somewhat high at times but within post stroke/TIA parameters . Permissive hypertension (OK if < 220/120) but gradually normalize in 5-7 days  . Long-term BP goal normotensive  Hyperlipidemia  Home Lipid lowering medication:  Lipitor 40 mg daily  LDL 71, goal < 70  Current lipid lowering medication: None   Continue statin at discharge  Diabetes  Home diabetic meds: insulin  Current diabetic meds: insulin  HgbA1c 7.3, goal < 7.0 Recent Labs    09/25/19 1151 09/25/19 1548 09/25/19 1652  GLUCAP 173* 53* 119*    Other Stroke Risk Factors  Cigarette smoker - advised to stop smoking  Previous ETOH use  Hx stroke/TIA  Family hx stroke - not on file  Previous Substance Abuse   Other Active Problems  Leukocytosis (afebrile)  CKD  Hypokalemia - supplemented   Plan  Consider loop implant, discussed with Dr. Florene Glen who did speak to Dr. Caryl Comes and at this time Dr. Caryl Comes does not recommend loop.  Patient of Dr. Leonie Man, embolic strokes second admission, has been thoroughly tested, 3 months ago had TEE, hypercoag labs. Recurrent strokes. I discussed plan with attending per Dr. Leonie Man. Since Dr. Leonie Man is outpatient  neurologist and this is the second embolic stroke episode in a 41  yeard old, attending would like patient seen by Dr. Leonie Man in the morning since patient will be here pending abdomen workup.   I discussed warfarin/coumadin with patient. Last note by Dr. Leonie Man stated he was too young for [place], will defer to Dr. Leonie Man.  Hospital day # 2  Personally examined patient and images, and have participated in and made any corrections needed to history, physical, neuro exam,assessment and plan as stated above.  I have personally obtained the history, evaluated lab date, reviewed imaging studies and agree with radiology interpretations.    Sarina Ill, MD Stroke Neurology   A total of 35 minutes was spent for the care of this patient, spent on counseling patient and family on different diagnostic and therapeutic options, counseling and coordination of care, riskd ans benefits of management, compliance, or risk factor reduction and education.   To contact Stroke Continuity provider, please refer to [email or web address] After hours, contact General Neurology

## 2019-09-26 ENCOUNTER — Other Ambulatory Visit: Payer: Self-pay

## 2019-09-26 ENCOUNTER — Encounter (HOSPITAL_COMMUNITY): Payer: Self-pay

## 2019-09-26 LAB — COMPREHENSIVE METABOLIC PANEL
ALT: 18 U/L (ref 0–44)
AST: 17 U/L (ref 15–41)
Albumin: 2.9 g/dL — ABNORMAL LOW (ref 3.5–5.0)
Alkaline Phosphatase: 83 U/L (ref 38–126)
Anion gap: 9 (ref 5–15)
BUN: 32 mg/dL — ABNORMAL HIGH (ref 6–20)
CO2: 26 mmol/L (ref 22–32)
Calcium: 8.3 mg/dL — ABNORMAL LOW (ref 8.9–10.3)
Chloride: 104 mmol/L (ref 98–111)
Creatinine, Ser: 3.02 mg/dL — ABNORMAL HIGH (ref 0.61–1.24)
GFR calc Af Amer: 30 mL/min — ABNORMAL LOW (ref >60)
GFR calc non Af Amer: 26 mL/min — ABNORMAL LOW (ref >60)
Glucose, Bld: 215 mg/dL — ABNORMAL HIGH (ref 70–99)
Potassium: 4 mmol/L (ref 3.5–5.1)
Sodium: 139 mmol/L (ref 135–145)
Total Bilirubin: 0.6 mg/dL (ref 0.3–1.2)
Total Protein: 4.9 g/dL — ABNORMAL LOW (ref 6.5–8.1)

## 2019-09-26 LAB — CBC
HCT: 33.6 % — ABNORMAL LOW (ref 39.0–52.0)
Hemoglobin: 11.7 g/dL — ABNORMAL LOW (ref 13.0–17.0)
MCH: 30.6 pg (ref 26.0–34.0)
MCHC: 34.8 g/dL (ref 30.0–36.0)
MCV: 88 fL (ref 80.0–100.0)
Platelets: 169 10*3/uL (ref 150–400)
RBC: 3.82 MIL/uL — ABNORMAL LOW (ref 4.22–5.81)
RDW: 12.2 % (ref 11.5–15.5)
WBC: 8.6 10*3/uL (ref 4.0–10.5)
nRBC: 0 % (ref 0.0–0.2)

## 2019-09-26 LAB — GLUCOSE, CAPILLARY
Glucose-Capillary: 173 mg/dL — ABNORMAL HIGH (ref 70–99)
Glucose-Capillary: 170 mg/dL — ABNORMAL HIGH (ref 70–99)
Glucose-Capillary: 172 mg/dL — ABNORMAL HIGH (ref 70–99)
Glucose-Capillary: 271 mg/dL — ABNORMAL HIGH (ref 70–99)

## 2019-09-26 LAB — MAGNESIUM: Magnesium: 2 mg/dL (ref 1.7–2.4)

## 2019-09-26 MED ORDER — ASPIRIN EC 81 MG PO TBEC: 81.0000 mg | DELAYED_RELEASE_TABLET | Freq: Every day | ORAL | Status: DC

## 2019-09-26 MED ORDER — INSULIN NPH (HUMAN) (ISOPHANE) 100 UNIT/ML ~~LOC~~ SUSP: 8.0000 [IU] | Freq: Two times a day (BID) | SUBCUTANEOUS | 11 refills | Status: DC

## 2019-09-26 MED ORDER — HYDRALAZINE HCL 10 MG PO TABS: 10.0000 mg | ORAL_TABLET | Freq: Three times a day (TID) | ORAL | 0 refills | Status: DC

## 2019-09-26 MED ORDER — HYDRALAZINE HCL 10 MG PO TABS: 10.0000 mg | ORAL_TABLET | Freq: Three times a day (TID) | ORAL | Status: DC

## 2019-09-26 MED ORDER — INFLUENZA VAC SPLIT QUAD 0.5 ML IM SUSY: 0.5000 mL | PREFILLED_SYRINGE | INTRAMUSCULAR | Status: DC

## 2019-09-26 MED ORDER — AMLODIPINE BESYLATE 10 MG PO TABS: 10.0000 mg | ORAL_TABLET | Freq: Every day | ORAL | Status: DC

## 2019-09-26 MED ORDER — CLOPIDOGREL BISULFATE 75 MG PO TABS: 75.0000 mg | ORAL_TABLET | Freq: Every day | ORAL | 2 refills | Status: DC

## 2019-09-26 MED ORDER — ASPIRIN EC 81 MG PO TBEC: 81.0000 mg | DELAYED_RELEASE_TABLET | Freq: Every day | ORAL | 0 refills | Status: DC

## 2019-09-26 NOTE — Discharge Summary (Signed)
Physician Discharge Summary  [facility] UT:[id number] DOB: [birth year] DOA: 09/23/2019  PCP: Elsie Stain, MD  Admit date: 09/23/2019 Discharge date: 09/26/2019  Time spent: 40 minutes  Recommendations for Outpatient Follow-up:  1. Follow outpatient CBC/CMP 2. Follow BP outpatient - continue to adjust meds as tolerated 3. Follow up with neurology outpatient for cryptogenic strokes 4. Follow up with renal outpatient as scheduled for aki on ckd 5. Follow BG - insulin decreased here due to hypoglycemia 6. Follow final blood cultures (9/25 cx with 1/2 with gram positive rods - repeat cx ng at time of d/c)   Discharge Diagnoses:  Active Problems:   History of CVA (cerebrovascular accident)   AKI (acute kidney injury) ([place])   DKA (diabetic ketoacidoses) ([place])   Discharge Condition: stable  Diet recommendation: diabetic diet  Filed Weights   09/23/19 1606 09/23/19 2205  Weight: 59 kg 54.8 kg    History of present illness:  George Sowinskiis a 33 y.o.malewith medical history significant oftype 1 diabetes, hypertension, recent CVA in June with combined receptive and expressive aphasia, CKD stage III who presented here with altered mental status and reportedly has been having nausea and persistent vomiting since Wednesday. Reportedly from EDP patient was initially able to give some history but acutely became more agitated and was pulling out his IV. He was tachycardic up to 160s and had improvement of heart rate down to 120s with 10 mg of verapamil. He was also tachypneic but remained on room air. Patient was restless and agitated at the time my evaluation and per nursing report he had attempted to use his foot to remove his hand restraints. He repeatedly stated "please help me," but was unable to explain any of his symptoms to me. He was pan positive and replied yes to every review of systems questions. Also stating he has pain everywhere. When asked for his full name and  where he was he only stated "[place]."  He was afebrile and hypertensive up to 170s/108. In sinus tachycardia up to 160 and mildly tachypneic on room air. CBC showed leukocytosis of 29.6 and no anemia. CMP showed glucose level 532 with bicarb of 18 and anion gap of 24. pH was mildly alkalotic at 7.59. Lactic acid of 5 and then 3.2 following IV fluids. TSH normal. COVID testing negative. CT head shows 2 or 3 rounded oval regions of low attenuation in the cerebral hemisphere not seen on previous studies. Suggest could be small interval lacunar infarcts with the possibility of embolic disease. Findings were age-indeterminate. CT abdomen showed no acute findings. There is gallbladder sludge or stones but no findings of acute cholecystitis.  He was seen for nausea, vomiting, and altered mental status.  He was found to be in DKA.  He improved with insulin gtt and was transitioned to his home regimen.  He had head imaging and was found to have new strokes.  He was seen by neurology who recommended DAPT x 21 days then plavix alone.  Hospitalization also c/b AKI on CKD improving at time of discharge.  Hospital Course:  Diabetic ketoacidosis  T1DM  Hypoglycemia - Unclear precipitating cause.  - He's had borderline temps, tmax of 100.2 since admission, but clinically appears to be improving and UA and CXR not suggestive of infection - follow blood cx (gram positive rods in 1/2 blood cx - repeat cx from 9/27 ngtd)  and follow urine cx (urine did have WBC, but negative LE/nitrites - no growth) - hold off on abx for  now - DKA now resolved, AG 13, bicarb 22, BG173 - resume home insulin at reduced dose 8 units NPH with 3 units at meals and SSI (he's had some hypoglycemia here and he notes some at home as well - discussed importance of f/u with Dr. Joya Gaskins and monitoring BG at home) - Diabetic coordinator - A1c is 7.3 - check HbA1C  Altered mental status/delirium - delirium precautions - utox positive  for MJ - resolved  Acute Infarct R Parietal Cortex  Subacute Infarct R Cerebellum  Hx CVA: - Hx L PCA infarct in June - Was on ASA/plavix and supposed to stop plavix and continue ASA alone after 9/19 (admitting provider started on plavix - have added aspirin, will defer final plan to neuro) - Thought 2/2 cardioembolic source -> they'd planned for 30 day event monitor as echo and TEE were negative for PFO or idenifiable cardiac source of embolus and hypercoagulable/vasculitis w/u was negative per recent neuro note - will reconsult neurology in setting of acute stroke, appreciate recs - recurrent cryptogenic strokes - recommend ASA and plavix x3 weeks then plavix alone.  Of note, discharged with 81 mg ASA with plavix, but on review of neuro note - intended to be 325 mg aspirin with plavix x21 days then plavix alone.  Called mother to correct this after discharge - she expressed understanding.  Hypertension  Tachycardia Significantly elevated BP's this AM His clonidine and coreg were being held -> which likely contributed to rebound hypertension and tachycardia Resume clonidine and coreg and amlodipine - BP still significantly elevated - will add hydralazine at d/c to help gradually normalize BP after stroke - needs close follow up with Dr. Joya Gaskins EKG  with sinus tachycardia  Acute kidney injury -Creatinine of 4.11 on admission - improved to 3.02 today - good UOP - baseline creatinine ~2.2-2.4 - renal US without hydro, distended bladder noted (per nursing 9/26, bladder scan showed around 500 cc, then he voided as much) - creatinine improving - follow with nephrology as outpatient -Continue to monitor serial BMP -Avoidnephrotoxic agent  Leukocytosis: improving, follow, follow cx - given sludge vs stone in gallbladder on imaging, will follow RUQ [place] as he did have N/V on presentation - RUQ wnl  Gram Positive Rods in 1/2 Cx: suspect contaminant, improving off abx - follow repeat cx  (NGTD)  Procedures:  none  Consultations:  neurology  Discharge Exam: Vitals:   09/26/19 1211 09/26/19 1400  BP: (!) 186/96 (!) 175/97  Pulse: 80 74  Resp:    Temp: 98.1 F (36.7 C) 98.5 F (36.9 C)  SpO2: 99% 99%   Feels well Ready to go home No complaints Mother at bedside Discussed d/c plan  General: No acute distress. Cardiovascular: Heart sounds show a regular rate, and rhythm. Lungs: Clear to auscultation bilaterally  Abdomen: Soft, nontender, nondistended  Neurological: Alert and oriented 3. Moves all extremities 4. Cranial nerves II through XII grossly intact. Skin: Warm and dry. No rashes or lesions. Extremities: No clubbing or cyanosis. No edema.   Discharge Instructions   Discharge Instructions    Ambulatory referral to Neurology   Complete by: As directed    Follow up in stroke clinic at [facility] with Frann Rider, NP in about 4 weeks. If not available, consider Dr. Antony Contras, Dr. Bess Harvest, or Dr. Sarina Ill.   Diet - low sodium heart healthy   Complete by: As directed    Discharge instructions   Complete by: As directed  You were seen for diabetic ketoacidosis.  This has improved.  Your insulin dose was decreased.  We'll send you home on 8 units of NPH twice daily with 4 units of insulin with meals 3 times a day.  Please keep a log of your blood sugars and bring this to Dr. Joya Gaskins at your follow up appointment so you can adjust this as needed.  You had another stroke.  You were seen by neurology who recommends outpatient follow up.  Please follow up with neurology as an outpatient.  You'll be discharged on aspirin and plavix to take for 21 days (3 weeks) and then stop the aspirin and take plavix only.  Continue your lipitor.    Your blood pressure remains high.  Please follow up with Dr. Joya Gaskins regarding this.  You'll need further adjustment in your blood pressure medications.    Your kidney function is not  back to baseline.  Please follow up with your PCP and kidney doctors for repeat labs and continued follow up.  Return for new, recurrent, or worsening symptoms.  Please ask your PCP to request records from this hospitalization so they know what was done and what the next steps will be.   Increase activity slowly   Complete by: As directed      Allergies as of 09/26/2019   No Known Allergies     Medication List    TAKE these medications   acetaminophen 325 MG tablet Commonly known as: TYLENOL Take 650 mg by mouth every 6 (six) hours as needed for headache.   amLODipine 10 MG tablet Commonly known as: NORVASC Take 1 tablet (10 mg total) by mouth daily.   aspirin EC 81 MG tablet Take 1 tablet (81 mg total) by mouth daily for 21 days. What changed:   medication strength  how much to take   atorvastatin 40 MG tablet Commonly known as: LIPITOR Take 1 tablet (40 mg total) by mouth daily at 6 PM.   carvedilol 25 MG tablet Commonly known as: COREG Take 1 tablet (25 mg total) by mouth 2 (two) times daily with a meal.   cloNIDine 0.2 MG tablet Commonly known as: CATAPRES Take one tablet twice daily What changed:   how much to take  how to take this  when to take this  additional instructions   clopidogrel 75 MG tablet Commonly known as: PLAVIX Take 1 tablet (75 mg total) by mouth daily.   hydrALAZINE 10 MG tablet Commonly known as: APRESOLINE Take 1 tablet (10 mg total) by mouth every 8 (eight) hours.   insulin NPH Human 100 UNIT/ML injection Commonly known as: NOVOLIN N Inject 0.08 mLs (8 Units total) into the skin 2 (two) times daily with a meal. What changed: how much to take   insulin regular 100 units/mL injection Commonly known as: NOVOLIN R Inject 0.04 mLs (4 Units total) into the skin 3 (three) times daily before meals. May increase by 1 unit at each dose for glucose greater than 180 What changed:   when to take this  additional instructions    nicotine polacrilex 4 MG lozenge Commonly known as: Nicorette Mini Take three times daily to stop smoking      No Known Allergies Follow-up Information    [facility] Follow up in 4 week(s).   Specialty: Neurology Why: stroke clinic. office will call with appt date and time.  Contact information: [address] [place] [phone]  The results of significant diagnostics from this hospitalization (including imaging, microbiology, ancillary and laboratory) are listed below for reference.    Significant Diagnostic Studies: Ct Abdomen Pelvis Wo Contrast  Result Date: 09/23/2019 CLINICAL DATA:  Nausea and vomiting since Wednesday. EXAM: CT ABDOMEN AND PELVIS WITHOUT CONTRAST TECHNIQUE: Multidetector CT imaging of the abdomen and pelvis was performed following the standard protocol without IV contrast. COMPARISON:  None. FINDINGS: Lower chest: The lung bases are clear of an acute process. No pleural effusions. The heart is normal in size. No pericardial effusion. Mild bilateral gynecomastia noted. Hepatobiliary: No focal hepatic lesions are identified without contrast. The gallbladder contains high attenuation material which could be sludge or stones. I do not see any obvious findings for acute cholecystitis but right upper quadrant ultrasound examination may be helpful for further evaluation. No common bile duct dilatation. Pancreas: No mass, inflammation or ductal dilatation. Spleen: Normal size.  No focal lesions. Adrenals/Urinary Tract: The adrenal glands and kidneys are unremarkable. No renal, ureteral or bladder calculi or obvious mass. Stomach/Bowel: The stomach, duodenum, small bowel and colon are grossly unremarkable without oral contrast. No acute inflammatory changes, mass lesions or obstructive findings. The appendix is opacified possibly related to a prior contrasted study. No inflammation to suggest appendicitis.  Vascular/Lymphatic: The aorta is normal in caliber. No atheroscerlotic calcifications. No mesenteric of retroperitoneal mass or adenopathy. Small scattered lymph nodes are noted. Reproductive: The prostate gland and seminal vesicles are unremarkable. Other: No pelvic mass or adenopathy. No free pelvic fluid collections. No inguinal mass or adenopathy. No abdominal wall hernia or subcutaneous lesions. Musculoskeletal: No significant bony findings. IMPRESSION: 1. No acute abdominal/pelvic findings, mass lesions or adenopathy. 2. No renal, ureteral or bladder calculi or mass. 3. High attenuation material in the gallbladder could be sludge or stones. No obvious CT findings for acute cholecystitis. Right upper quadrant ultrasound examination may be helpful for further evaluation. Electronically Signed   By: Marijo Sanes M.D.   On: 09/23/2019 18:51   Ct Head Wo Contrast  Result Date: 09/23/2019 CLINICAL DATA:  Altered level of consciousness. EXAM: CT HEAD WITHOUT CONTRAST TECHNIQUE: Contiguous axial images were obtained from the base of the skull through the vertex without intravenous contrast. COMPARISON:  June 12, 2019 FINDINGS: Brain: No subdural, epidural, or subarachnoid hemorrhage 3 tiny rounded regions of low attenuation are seen in the cerebellum with 2 on the right and 1 on the left as seen on series 3, images 12 and 13. These findings were not seen previously. The basal cisterns are patent. This the brainstem is normal. The ventricles are stable. The left a separate all and temporal lobe infarct seen on the previous study are again noted with decreasing attenuation/encephalomalacia. No other sites of interval ischemia or infarct. No mass effect or midline shift. Vascular: No hyperdense vessel or unexpected calcification. Skull: Normal. Negative for fracture or focal lesion. Sinuses/Orbits: No acute finding. Other: None. IMPRESSION: 1. Two or 3 rounded/oval regions of low attenuation in the cerebellar  hemispheres as described above, not seen on the previous study. These could represent small interval lacunar infarcts. The multiplicity raises the possibility of embolic disease. The findings are otherwise age indeterminate. An MRI could further assess and determine acuity. 2. No other acute abnormalities are noted. Electronically Signed   By: Dorise Bullion III M.D   On: 09/23/2019 18:54   Mr Brain Wo Contrast  Result Date: 09/24/2019 CLINICAL DATA:  Rule out acute infarct. EXAM: MRI HEAD WITHOUT CONTRAST TECHNIQUE: Multiplanar, multiecho  pulse sequences of the brain and surrounding structures were obtained without intravenous contrast. COMPARISON:  CT head 09/23/2019.  MRI head 06/14/2019 FINDINGS: Brain: Small focus of restricted diffusion in the right parietal cortex over the convexity compatible with acute infarct. 5 mm. Facilitated diffusion right cerebellar hemisphere. Associated T2 and FLAIR hyperintensity. Probable subacute, recent infarction. This was not present on the prior MRI. Chronic infarct left cerebellum, not present on the prior MRI. Chronic left PCA infarct involving the left occipital lobe. This shows evidence of chronic mild hemorrhage. Ventricle size normal. Vascular: Normal arterial flow voids. Skull and upper cervical spine: Negative Sinuses/Orbits: Negative Other: None IMPRESSION: 1. 5 mm acute infarct right parietal cortex over the convexity 2. Small area of subacute infarct right cerebellum. Chronic infarct left cerebellum 3. Chronic hemorrhagic infarct left occipital lobe. Electronically Signed   By: Franchot Gallo M.D.   On: 09/24/2019 12:03   US Renal  Result Date: 09/24/2019 CLINICAL DATA:  Acute kidney injury EXAM: RENAL / URINARY TRACT ULTRASOUND COMPLETE COMPARISON:  None. FINDINGS: Right Kidney: Renal measurements: 11.1 x 4.9 x 6.2 cm = volume: 172 mL . Echogenicity within normal limits. No mass or hydronephrosis visualized. Left Kidney: Renal measurements: 10.8 x 5.3 x  5.5 cm = volume: There is 165 mL. Echogenicity within normal limits. No mass or hydronephrosis visualized. Bladder: Distended bladder. IMPRESSION: 1. No significant renal abnormality. 2. Distended bladder. Electronically Signed   By: Suzy Bouchard M.D.   On: 09/24/2019 13:39   Dg Chest Port 1 View  Result Date: 09/23/2019 CLINICAL DATA:  Hyperglycemia.  Low-grade fever. EXAM: PORTABLE CHEST 1 VIEW COMPARISON:  June 15, 2019 FINDINGS: The heart size and mediastinal contours are within normal limits. Both lungs are clear. The visualized skeletal structures are unremarkable. IMPRESSION: No active disease. Electronically Signed   By: Dorise Bullion III M.D   On: 09/23/2019 16:33   US Abdomen Limited Ruq  Result Date: 09/25/2019 CLINICAL DATA:  Nausea and vomiting for 5 days. EXAM: ULTRASOUND ABDOMEN LIMITED RIGHT UPPER QUADRANT COMPARISON:  CT 09/23/2019 FINDINGS: Gallbladder: Physiologically distended. No gallstones or wall thickening visualized. No sonographic Murphy sign noted by sonographer. Common bile duct: Diameter: 2 mm, normal. Liver: No focal lesion identified. Within normal limits in parenchymal echogenicity. Portal vein is patent on color Doppler imaging with normal direction of blood flow towards the liver. Other: None. IMPRESSION: Normal right upper quadrant ultrasound. Electronically Signed   By: Keith Rake M.D.   On: 09/25/2019 20:53    Microbiology: Recent Results (from the past 240 hour(s))  Culture, blood (routine x 2)     Status: None (Preliminary result)   Collection Time: 09/23/19  4:01 PM   Specimen: BLOOD  Result Value Ref Range Status   Specimen Description BLOOD RIGHT ANTECUBITAL  Final   Special Requests   Final    BOTTLES DRAWN AEROBIC AND ANAEROBIC Blood Culture adequate volume   Culture  Setup Time   Final    GRAM POSITIVE RODS ANAEROBIC BOTTLE ONLY CRITICAL RESULT CALLED TO, READ BACK BY AND VERIFIED WITH: J. LEDFORD,PHARMD 0708 09/25/2019 T. TYSOR     Culture   Final    GRAM POSITIVE RODS IDENTIFICATION TO FOLLOW Performed at [facility], [place] [address], [place], [place] [address]    Report Status PENDING  Incomplete  Culture, blood (routine x 2)     Status: None (Preliminary result)   Collection Time: 09/23/19  4:09 PM   Specimen: BLOOD RIGHT HAND  Result  Value Ref Range Status   Specimen Description BLOOD RIGHT HAND  Final   Special Requests   Final    BOTTLES DRAWN AEROBIC AND ANAEROBIC Blood Culture adequate volume   Culture   Final    NO GROWTH 3 DAYS Performed at [facility], [address], [place], [place] [address]    Report Status PENDING  Incomplete  SARS Coronavirus 2 [facility] order, Performed in [facility] hospital lab) Nasopharyngeal Nasopharyngeal Swab     Status: None   Collection Time: 09/23/19  5:31 PM   Specimen: Nasopharyngeal Swab  Result Value Ref Range Status   SARS Coronavirus 2 NEGATIVE NEGATIVE Final    Comment: (NOTE) If result is NEGATIVE SARS-CoV-2 target nucleic acids are NOT DETECTED. The SARS-CoV-2 RNA is generally detectable in upper and lower  respiratory specimens during the acute phase of infection. The lowest  concentration of SARS-CoV-2 viral copies this assay can detect is 250  copies / mL. A negative result does not preclude SARS-CoV-2 infection  and should not be used as the sole basis for treatment or other  patient management decisions.  A negative result may occur with  improper specimen collection / handling, submission of specimen other  than nasopharyngeal swab, presence of viral mutation(s) within the  areas targeted by this assay, and inadequate number of viral copies  (<250 copies / mL). A negative result must be combined with clinical  observations, patient history, and epidemiological information. If result is POSITIVE SARS-CoV-2 target nucleic acids are DETECTED. The SARS-CoV-2 RNA is generally detectable in upper and lower  respiratory specimens dur ing  the acute phase of infection.  Positive  results are indicative of active infection with SARS-CoV-2.  Clinical  correlation with patient history and other diagnostic information is  necessary to determine patient infection status.  Positive results do  not rule out bacterial infection or co-infection with other viruses. If result is PRESUMPTIVE POSTIVE SARS-CoV-2 nucleic acids MAY BE PRESENT.   A presumptive positive result was obtained on the submitted specimen  and confirmed on repeat testing.  While 2019 novel coronavirus  (SARS-CoV-2) nucleic acids may be present in the submitted sample  additional confirmatory testing may be necessary for epidemiological  and / or clinical management purposes  to differentiate between  SARS-CoV-2 and other Sarbecovirus currently known to infect humans.  If clinically indicated additional testing with an alternate test  methodology [id number]) is advised. The SARS-CoV-2 RNA is generally  detectable in upper and lower respiratory sp ecimens during the acute  phase of infection. The expected result is Negative. Fact Sheet for Patients:  StrictlyIdeas.no Fact Sheet for Healthcare Providers: BankingDealers.co.za This test is not yet approved or cleared by the [place] FDA and has been authorized for detection and/or diagnosis of SARS-CoV-2 by FDA under an Emergency Use Authorization (EUA).  This EUA will remain in effect (meaning this test can be used) for the duration of the COVID-19 declaration under Section 564(b)(1) of the Act, 21 U.S.C. section 360bbb-3(b)(1), unless the authorization is terminated or revoked sooner. Performed at [facility], [place] [address] [place], [place] [address]   MRSA PCR Screening     Status: None   Collection Time: 09/23/19 11:02 PM   Specimen: Nasal Mucosa; Nasopharyngeal  Result Value Ref Range Status   MRSA by PCR NEGATIVE NEGATIVE Final    Comment:         The GeneXpert MRSA Assay (FDA approved for NASAL specimens only), is one  component of a comprehensive MRSA colonization surveillance program. It is not intended to diagnose MRSA infection nor to guide or monitor treatment for MRSA infections. Performed at [facility], [place] [address], [place], [place] [address]   Culture, Urine     Status: None   Collection Time: 09/24/19  7:37 AM   Specimen: Urine, Random  Result Value Ref Range Status   Specimen Description URINE, RANDOM  Final   Special Requests NONE  Final   Culture   Final    NO GROWTH Performed at [facility], [place] [address] [place], [place] [address]    Report Status 09/25/2019 FINAL  Final  Culture, blood (routine x 2)     Status: None (Preliminary result)   Collection Time: 09/25/19  7:44 AM   Specimen: BLOOD  Result Value Ref Range Status   Specimen Description BLOOD RIGHT ANTECUBITAL  Final   Special Requests AEROBIC BOTTLE ONLY Blood Culture adequate volume  Final   Culture   Final    NO GROWTH 1 DAY Performed at [facility], [place] [address] [place], [place] [address]    Report Status PENDING  Incomplete  Culture, blood (routine x 2)     Status: None (Preliminary result)   Collection Time: 09/25/19  7:50 AM   Specimen: BLOOD RIGHT HAND  Result Value Ref Range Status   Specimen Description BLOOD RIGHT HAND  Final   Special Requests   Final    AEROBIC BOTTLE ONLY Blood Culture results may not be optimal due to an inadequate volume of blood received in culture bottles   Culture   Final    NO GROWTH 1 DAY Performed at [facility], [place] [address], [place], [place] [address]    Report Status PENDING  Incomplete     Labs: Basic Metabolic Panel: Recent Labs  Lab 09/24/19 0454 09/24/19 0809 09/24/19 1213 09/25/19 0402 09/26/19 0438  NA 136 138 138 140 139  K 3.1* 3.3* 3.8 3.2* 4.0  CL 99 101 103 105 104  CO2 22 22 22 25 26   GLUCOSE 136* 166* 173* 45* 215*  BUN 49* 47* 47*  34* 32*  CREATININE 3.70* 3.70* 3.46* 3.07* 3.02*  CALCIUM 8.7* 8.5* 8.6* 8.5* 8.3*  MG  --   --   --  2.0 2.0   Liver Function Tests: Recent Labs  Lab 09/23/19 1601 09/25/19 0402 09/26/19 0438  AST 28 20 17   ALT 25 20 18   ALKPHOS 122 82 83  BILITOT 1.4* 0.8 0.6  PROT 7.1 5.3* 4.9*  ALBUMIN 4.3 3.2* 2.9*   No results for input(s): LIPASE, AMYLASE in the last 168 hours. No results for input(s): AMMONIA in the last 168 hours. CBC: Recent Labs  Lab 09/23/19 1601 09/23/19 1609 09/24/19 0809 09/25/19 0402 09/26/19 0438  WBC 29.6*  --  22.2* 14.3* 8.6  NEUTROABS 27.1*  --   --   --   --   HGB 14.4 14.6 13.4 12.9* 11.7*  HCT 40.4 43.0 38.2* 36.5* 33.6*  MCV 85.8  --  86.6 88.0 88.0  PLT 296  --  246 223 169   Cardiac Enzymes: No results for input(s): CKTOTAL, CKMB, CKMBINDEX, TROPONINI in the last 168 hours. BNP: BNP (last 3 results) No results for input(s): BNP in the last 8760 hours.  ProBNP (last 3 results) No results for input(s): PROBNP in the last 8760 hours.  CBG: Recent Labs  Lab 09/25/19 2134 09/26/19 0450 09/26/19  [id number] 09/26/19 1209 09/26/19 1350  GLUCAP 324* 173* 170* 172* 271*       Signed:  Fayrene Helper MD.  [facility] 09/26/2019, 8:03 PM

## 2019-09-26 NOTE — Progress Notes (Signed)
Physical Therapy Treatment/Discharge Patient Details Name: George [name] MRN: [id number] DOB: [birth year] Today's Date: 09/26/2019    History of Present Illness George [name] is a 33 y.o. 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 PMHx: type 1 diabetes, hypertension, recent CVA in June with combined receptive and expressive aphasia, CKD stage III.    PT Comments    Pt doing well with mobility and no further PT needed.  Ready for dc from PT standpoint.     Follow Up Recommendations  No PT follow up     Equipment Recommendations  None recommended by PT    Recommendations for Other Services       Precautions / Restrictions Precautions Precautions: None Restrictions Weight Bearing Restrictions: No    Mobility  Bed Mobility Overal bed mobility: Independent                Transfers Overall transfer level: Independent                  Ambulation/Gait Ambulation/Gait assistance: Independent Gait Distance (Feet): 600 Feet Assistive device: None Gait Pattern/deviations: WFL(Within Functional Limits) Gait velocity: normal Gait velocity interpretation: >4.37 ft/sec, indicative of normal walking speed General Gait Details: Steady gait at good speed without any difficulty.   Stairs             Wheelchair Mobility    Modified Rankin (Stroke Patients Only) Modified Rankin (Stroke Patients Only) Pre-Morbid Rankin Score: No significant disability Modified Rankin: No significant disability     Balance Overall balance assessment: Independent                                          Cognition Arousal/Alertness: Awake/alert Behavior During Therapy: WFL for tasks assessed/performed Overall Cognitive Status: Within Functional Limits for tasks assessed                                        Exercises Other Exercises Other Exercises: Instructed in repeated  sit to stands Other Exercises: Instructed in single leg stance on level and unlevel surfaces and with eyes open and eyes closed.    General Comments        Pertinent Vitals/Pain Pain Assessment: No/denies pain    Home Living                      Prior Function            PT Goals (current goals can now be found in the care plan section) Acute Rehab PT Goals Patient Stated Goal: to go home Progress towards PT goals: Goals met/education completed, patient discharged from PT    Frequency           PT Plan Other (comment)(Pt dc'd from PT )    Co-evaluation              AM-PAC PT "6 Clicks" Mobility   Outcome Measure  Help needed turning from your back to your side while in a flat bed without using bedrails?: None Help needed moving from lying on your back to sitting on the side of a flat bed without using bedrails?: None Help needed moving to and from a bed to a chair (including a wheelchair)?: None Help needed  standing up from a chair using your arms (e.g., wheelchair or bedside chair)?: None Help needed to walk in hospital room?: None Help needed climbing 3-5 steps with a railing? : None 6 Click Score: 24    End of Session   Activity Tolerance: Patient tolerated treatment well Patient left: with call bell/phone within reach;in bed   PT Visit Diagnosis: Muscle weakness (generalized) (M62.81)     Time: 1448-1856 PT Time Calculation (min) (ACUTE ONLY): 12 min  Charges:  $Gait Training: 8-22 mins                     [place] Pager [phone] Office [place] 09/26/2019, 9:38 AM

## 2019-09-26 NOTE — Care Management (Signed)
PTA from home, pt is discharging home today.  Pt confirms he is active with [facility] and that he can afford to pay for his new discharge medication.  No CM needs identified - CM signing off

## 2019-09-26 NOTE — Progress Notes (Signed)
STROKE TEAM PROGRESS NOTE         INTERVAL HISTORY The patient is known to me from previous admission.  I personally reviewed history of presenting illness in detail with the patient and his mother.  Patient is sitting up comfortably in a chair.  His mother is next to him.  He states his blurred vision has improved.  He does not have health insurance and states he has been not been too compliant with medications.  He admits to smoking marijuana.  His hemoglobin A1c has improved to 7.3 but is still not optimal.  He states he was taking aspirin regularly.    OBJECTIVE Vitals:   09/26/19 0724 09/26/19 0800 09/26/19 1211 09/26/19 1400  BP: (!) 185/104 (!) 184/106 (!) 186/96 (!) 175/97  Pulse: 77 76 80 74  Resp: 16 16    Temp: 98.7 F (37.1 C)  98.1 F (36.7 C) 98.5 F (36.9 C)  TempSrc: Oral  Oral Oral  SpO2: 100% 99% 99% 99%  Weight:      Height:        CBC:  Recent Labs  Lab 09/23/19 1601  09/25/19 0402 09/26/19 0438  WBC 29.6*   < > 14.3* 8.6  NEUTROABS 27.1*  --   --   --   HGB 14.4   < > 12.9* 11.7*  HCT 40.4   < > 36.5* 33.6*  MCV 85.8   < > 88.0 88.0  PLT 296   < > 223 169   < > = values in this interval not displayed.    Basic Metabolic Panel:  Recent Labs  Lab 09/25/19 0402 09/26/19 0438  NA 140 139  K 3.2* 4.0  CL 105 104  CO2 25 26  GLUCOSE 45* 215*  BUN 34* 32*  CREATININE 3.07* 3.02*  CALCIUM 8.5* 8.3*  MG 2.0 2.0    Lipid Panel:     Component Value Date/Time   CHOL 156 06/27/2019 1033   TRIG 46 06/27/2019 1033   HDL 76 06/27/2019 1033   CHOLHDL 2.1 06/27/2019 1033   CHOLHDL 4.1 06/13/2019 0251   VLDL 60 (H) 06/13/2019 0251   LDLCALC 71 06/27/2019 1033   HgbA1c:  Lab Results  Component Value Date   HGBA1C 7.3 (H) 09/24/2019   Urine Drug Screen:     Component Value Date/Time   LABOPIA NONE DETECTED 09/23/2019 2056   COCAINSCRNUR NONE DETECTED 09/23/2019 2056   LABBENZ NONE DETECTED 09/23/2019 2056   AMPHETMU NONE DETECTED  09/23/2019 2056   THCU POSITIVE (A) 09/23/2019 2056   LABBARB NONE DETECTED 09/23/2019 2056    Alcohol Level     Component Value Date/Time   ETH <10 09/23/2019 2155    IMAGING  Ct Abdomen Pelvis Wo Contrast 09/23/2019 IMPRESSION: 1. No acute abdominal/pelvic findings, mass lesions or adenopathy.  2. No renal, ureteral or bladder calculi or mass.  3. High attenuation material in the gallbladder could be sludge or stones. No obvious CT findings for acute cholecystitis. Right upper quadrant ultrasound examination may be helpful for further evaluation.   Ct Head Wo Contrast 09/23/2019 IMPRESSION:  1. Two or 3 rounded/oval regions of low attenuation in the cerebellar hemispheres as described above, not seen on the previous study. These could represent small interval lacunar infarcts. The multiplicity raises the possibility of embolic disease. The findings are otherwise age indeterminate. An MRI could further assess and determine acuity.  2. No other acute abnormalities are noted.    Mr Brain Wo Contrast  09/24/2019 IMPRESSION:  1. 5 mm acute infarct right parietal cortex over the convexity  2. Small area of subacute infarct right cerebellum. Chronic infarct left cerebellum  3. Chronic hemorrhagic infarct left occipital lobe.   US Renal 09/24/2019 IMPRESSION:  1. No significant renal abnormality.  2. Distended bladder.    Dg Chest Port 1 View 09/23/2019 MPRESSION:  No active disease.    TEE 06/20/2019 IMPRESSIONS  1. The cavity size was normal. There is mildly increased left ventricular wall thickness.  2. LA, LAA without masses.  3. No PFO by color doppler or as tested with injection of agitated saline.  4. The aortic valve is tricuspid.  5. The aortic root, ascending aorta, aortic arch and descending aorta are normal in size and structure.   ECG - ST rate 110  BPM. (See cardiology reading for complete details)   PHYSICAL EXAM Blood pressure (!) 175/97, pulse 74,  temperature 98.5 F (36.9 C), temperature source Oral, resp. rate 16, height 5\' 3"  (1.6 m), weight 54.8 kg, SpO2 99 %.   Pleasant young Caucasian male not in distress. . Afebrile. Head is nontraumatic. Neck is supple without bruit.    Cardiac exam no murmur or gallop. Lungs are clear to auscultation. Distal pulses are well felt. Neurological Exam ;  Awake  Alert oriented x 3. Normal speech and language.eye movements full without nystagmus.fundi were not visualized. Vision acuity and fields appear normal. Hearing is normal. Palatal movements are normal. Face symmetric. Tongue midline. Normal strength, tone, reflexes and coordination. Normal sensation. Gait deferred.    ASSESSMENT/PLAN [name] is a 33 y.o. male with history of stroke, AKI, SVT and DM presenting with AMS, DKA and agitation.   Stroke: multiple bilateral infarcts - embolic - recurrent . Cryptogenic etiology source unknown  Resultant  No deficits  Code Stroke CT Head - not ordered  CT head - Two or 3 rounded/oval regions of low attenuation in the cerebellar hemispheres as described above, not seen on the previous study. These could represent small interval lacunar infarcts.  MRI head -  5 mm acute infarct right parietal cortex over the convexity. Small area of subacute infarct right cerebellum. Chronic infarct left cerebellum. Chronic hemorrhagic infarct left occipital lobe.   MRA head : see 06/14/2019  CTA H&N : see MRA head and carotid dopplers 06/13/2019  CT Perfusion: not performed  Carotid Doppler - see 06/13/2019  2D Echo - June 2020 - 50-55%. Cannot exclude PFO though no definite L to R shunt detected. Consider transthoracic agitated saline exam ->TEE did not show evidence of PFO or cardiac source of embolus.  [employer] Virus 2 - not found  LDL - 71  HgbA1c - 7.3  UDS - positive for THCU  VTE prophylaxis - [place] Heparin Diet  Diet Order            Diet Carb Modified Fluid consistency: Thin;  Room service appropriate? Yes  Diet effective now              aspirin 325 mg daily and clopidogrel 75 mg daily prior to admission, now on aspirin 325 mg daily and clopidogrel 75 mg daily continue DUAP for 3 weeks then Plavix alone  Patient counseled to be compliant with his antithrombotic medications  Ongoing aggressive stroke risk factor management  Therapy recommendations:  pending  Disposition:  Pending  Hypertension  Home BP meds: Norvasc, Coreg, Catapres  Current BP meds: Coreg, Catapres  Blood pressure somewhat high at times but  within post stroke/TIA parameters . Permissive hypertension (OK if < 220/120) but gradually normalize in 5-7 days  . Long-term BP goal normotensive  Hyperlipidemia  Home Lipid lowering medication:  Lipitor 40 mg daily  LDL 71, goal < 70  Current lipid lowering medication: None   Continue statin at discharge  Diabetes  Home diabetic meds: insulin  Current diabetic meds: insulin  HgbA1c 7.3, goal < 7.0 Recent Labs    09/26/19 0726 09/26/19 1209 09/26/19 1350  GLUCAP 170* 172* 271*    Other Stroke Risk Factors  Cigarette smoker - advised to stop smoking  Previous ETOH use  Hx stroke/TIA  Family hx stroke - not on file  Previous Substance Abuse   Other Active Problems  Leukocytosis (afebrile)  CKD  Hypokalemia - supplemented   Plan He has presented with recurrent cryptogenic strokes unfortunately studies have not shown any benefit of anticoagulation.  Recommend aspirin and Plavix for 3 weeks followed by Plavix alone.  I have counseled the patient to quit smoking marijuana and he agrees.  Aggressive risk factor modification.  Follow-up as an outpatient in the stroke clinic in 6 weeks.  Discussed with patient and his mother and answered questions.  Discussed with Dr. Florene Glen.  Greater than 50% time during this 25-minute visit was spent on counseling and coordination of care about his recurrent cryptogenic strokes and  answering questions. Hospital day # 3  Personally examined patient and images, and have participated in and made any corrections needed to history, physical, neuro exam,assessment and plan as stated above.  I have personally obtained the history, evaluated lab date, reviewed imaging studies and agree with radiology interpretations.  Stroke team will sign off.  Kindly call for questions.  Antony Contras, MD Stroke Neurology   A total of 35 minutes was spent for the care of this patient, spent on counseling patient and family on different diagnostic and therapeutic options, counseling and coordination of care, riskd ans benefits of management, compliance, or risk factor reduction and education.   To contact Stroke Continuity provider, please refer to [email or web address] After hours, contact General Neurology

## 2019-09-26 NOTE — Progress Notes (Signed)
Occupational Therapy Treatment Patient Details Name: George [name] MRN: [id number] DOB: 1986/12/08 Today's Date: 09/26/2019    History of present illness George [name] is a 33 y.o. 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 PMHx: type 1 diabetes, hypertension, recent CVA in June with combined receptive and expressive aphasia, CKD stage III.   OT comments  Pt progressing towards OT Goals, performing functional mobility and standing grooming ADL at supervision level today. Educated in additional visual compensatory strategies during ADL/mobility tasks as pt reports continued R visual blurriness, as well as LUE strengthening HEP with pt verbalizing/return demonstrating understanding. Educated both pt/pt's mother in signs/symptoms of stroke including BEFAST acronym with pt verbalizing understanding. Feel pt is likely at/close to his baseline with ADL/mobility and with no further acute OT needs identified. Pt eager to discharge home. Acute OT to sign off.    Follow Up Recommendations  No OT follow up;Supervision - Intermittent    Equipment Recommendations  None recommended by OT          Precautions / Restrictions Precautions Precautions: None Restrictions Weight Bearing Restrictions: No       Mobility Bed Mobility Overal bed mobility: Independent                Transfers Overall transfer level: Independent                    Balance Overall balance assessment: Independent                                         ADL either performed or assessed with clinical judgement   ADL Overall ADL's : Needs assistance/impaired     Grooming: Supervision/safety;Wash/dry hands;Standing                               Functional mobility during ADLs: Modified independent General ADL Comments: pt with mild LOB initially with standing but able to correct with close minguard assist;  educated in visual compensatory techniques and general UB HEP     Vision   Additional Comments: pt reports continued blurred vision in R eye, educated in visual compensatory techniques including light house method   Perception     Praxis      Cognition Arousal/Alertness: Awake/alert Behavior During Therapy: WFL for tasks assessed/performed Overall Cognitive Status: Within Functional Limits for tasks assessed                                          Exercises Exercises: Other exercises Other Exercises Other Exercises: instructed in generalized LUE HEP for strengthening/edema management including use of water bottle for added weight   Shoulder Instructions       General Comments      Pertinent Vitals/ Pain       Pain Assessment: No/denies pain  Home Living                                          Prior Functioning/Environment              Frequency  Min 2X/week  Progress Toward Goals  OT Goals(current goals can now be found in the care plan section)  Progress towards OT goals: Progressing toward goals  Acute Rehab OT Goals Patient Stated Goal: to go home OT Goal Formulation: With patient Time For Goal Achievement: 10/09/19 Potential to Achieve Goals: Good ADL Goals Pt Will Perform Grooming: with modified independence;standing Pt/caregiver will Perform Home Exercise Program: Left upper extremity;With written HEP provided;Increased strength Additional ADL Goal #1: Pt will state compensatory strategies for visual field deficits in R eye after education provided.  Plan Discharge plan remains appropriate    Co-evaluation                 AM-PAC OT "6 Clicks" Daily Activity     Outcome Measure   Help from another person eating meals?: None Help from another person taking care of personal grooming?: None Help from another person toileting, which includes using toliet, bedpan, or urinal?: None Help from  another person bathing (including washing, rinsing, drying)?: None Help from another person to put on and taking off regular upper body clothing?: None Help from another person to put on and taking off regular lower body clothing?: None 6 Click Score: 24    End of Session    OT Visit Diagnosis: Unsteadiness on feet (R26.81);Muscle weakness (generalized) (M62.81)   Activity Tolerance Patient tolerated treatment well   Patient Left in bed;with call bell/phone within reach;with family/visitor present   Nurse Communication Mobility status        Time: 1200-1219 OT Time Calculation (min): 19 min  Charges: OT General Charges $OT Visit: 1 Visit OT Treatments $Self Care/Home Management : 8-22 mins  Lou Cal, OT [facility] Pager [phone] Office [phone]    Raymondo Band 09/26/2019, 2:22 PM

## 2019-09-27 ENCOUNTER — Telehealth: Payer: Self-pay

## 2019-09-27 LAB — CULTURE, BLOOD (ROUTINE X 2): Special Requests: ADEQUATE

## 2019-09-27 NOTE — Telephone Encounter (Signed)
Transition Care Management Follow-up Telephone Call  Call completed with patient's mother - Reino Bellis.  DRP on file to speak to her.    Date of discharge and from where: 09/26/2019, [facility]   How have you been since you were released from the hospital? His mother stated that he is " doing fine."  She is amazed how well he has recovered after the strokes. She said that you would " never know anything was wrong." no complaints reported.  No bleeding,dizziness.  Any questions or concerns? Only one question was to confirm that clopidogrel is the same as plavix.   Items Reviewed:  Did the pt receive and understand the discharge instructions provided? Yes, she said they reviewed the instructions thoroughly when he was discharged. No questions  Medications obtained and verified? Yes, she said he has all medications including the hydralazine. She said that Dr Florene Glen called her instructed her to have him take aspirin 325 mg daily with plavix x 21 days. , not aspirin 81 mg as noted on the AVS. Insulin orders reviewed. No further questions  Any new allergies since your discharge? None reported   Do you have support at home? Yes, lives with parents  Other (ie: DME, Home Health, etc) no home health ordered  Has glucometer, mother states that he checks his blood sugars more than 3 times daily. Instructed her to have him keep log which he has not been keeping.   Functional Questionnaire: (I = Independent and D = Dependent) ADL's: independent   Follow up appointments reviewed:    PCP Hospital f/u appt confirmed? Yes, 10/07/2019 @ 1430 with Dr [facility] f/u appt confirmed? [facility] - 09/30/2019; renal ultrasound - 10/03/2019 - mother to call and confirm; [facility] - 10/03/2019 @ 1030  Are transportation arrangements needed? No , parents can drive   If their condition worsens, is the pt aware to call  their PCP or go to the ED? yes  Was the patient provided with contact  information for the PCP's office or ED? His mother has the clinic phone number.  Was the pt encouraged to call back with questions or concerns? Yes

## 2019-09-28 LAB — CULTURE, BLOOD (ROUTINE X 2)
Culture: NO GROWTH
Special Requests: ADEQUATE

## 2019-09-30 ENCOUNTER — Other Ambulatory Visit: Payer: Self-pay

## 2019-09-30 ENCOUNTER — Ambulatory Visit (INDEPENDENT_AMBULATORY_CARE_PROVIDER_SITE_OTHER): Payer: Medicaid Other

## 2019-09-30 ENCOUNTER — Ambulatory Visit (INDEPENDENT_AMBULATORY_CARE_PROVIDER_SITE_OTHER): Payer: Medicaid Other | Admitting: Cardiology

## 2019-09-30 ENCOUNTER — Encounter: Payer: Self-pay | Admitting: Cardiology

## 2019-09-30 VITALS — BP 120/80 | HR 81 | Temp 98.0°F | Ht 63.0 in | Wt 126.0 lb

## 2019-09-30 DIAGNOSIS — I10 Essential (primary) hypertension: Secondary | ICD-10-CM | POA: Diagnosis not present

## 2019-09-30 DIAGNOSIS — Z8673 Personal history of transient ischemic attack (TIA), and cerebral infarction without residual deficits: Secondary | ICD-10-CM

## 2019-09-30 DIAGNOSIS — F17200 Nicotine dependence, unspecified, uncomplicated: Secondary | ICD-10-CM | POA: Diagnosis not present

## 2019-09-30 LAB — CULTURE, BLOOD (ROUTINE X 2)
Culture: NO GROWTH
Culture: NO GROWTH
Special Requests: ADEQUATE

## 2019-09-30 NOTE — Progress Notes (Signed)
Cardiology Office Note:    Date:  09/30/2019   ID:  George [name], DOB Apr 26, 1986, MRN [id number]  PCP:  Elsie Stain, MD  Cardiologist:  Kate Sable, MD  Electrophysiologist:  None   Referring MD: Frann Rider, NP   Chief Complaint  Patient presents with  . New Patient (Initial Visit)    CVA/Hypertension    History of Present Illness:    George [name] is a 33 y.o. male with a hx of pretension, current smoker, diabetes type 1, CKD, CVA who presents due to a history of CVA and hypertension.    He had a recent hospitalization dates 09/23/2019 to 09/26/2019.  He presented with altered mental status found to be in DKA.  Blood pressures were elevated up to 170s/108 and also tachycardic up to 160s which improved with verapamil.  EKG was sinus tachycardia.  Head imaging did reveal an acute infarct in the right parietal cortex and subacute infarct in the right cerebellum.  Of note patient has a history of left PCA infarct in June 2020, after being admitted to the hospital due to slurred speech.  He was evaluated with a TEE which was negative for PFO or any cardiac source of embolus.  Seen by neurology who recommended patient be managed with dual antiplatelet with aspirin and Plavix for 3 weeks then Plavix after that.  He denies any palpitations.  Able to do all activities without chest pain or shortness of breath.  He originally was taking HCTZ for blood pressure, but that was stopped after his first hospitalization in June due to renal dysfunction.  On discharge about 4 days ago from the hospital, his blood pressure regimen was adjusted to amlodipine 10 mg daily, Coreg 25 mg twice daily, hydralazine 10 mg 3 times daily, clonidine 0.2 mg twice daily.  Patient is a current smoker has been smoking for about 8 years now.  He is trying to quit and is decrease his tobacco intake. Past Medical History:  Diagnosis Date  . AKI (acute kidney injury) ([employer] [employer])   . Cerebral infarction ([place])   .  Hypertension   . Neurologic deficit due to acute ischemic cerebrovascular accident (CVA) ([place]) 06/13/2019  . Supraventricular tachycardia ([place])   . Type 1 diabetes [facility])     Past Surgical History:  Procedure Laterality Date  . BUBBLE STUDY  06/20/2019   Procedure: BUBBLE STUDY;  Surgeon: Fay Records, MD;  Location: [place];  Service: Cardiovascular;;  . TEE WITHOUT CARDIOVERSION N/A 06/20/2019   Procedure: TRANSESOPHAGEAL ECHOCARDIOGRAM (TEE);  Surgeon: Fay Records, MD;  Location: [facility] ENDOSCOPY;  Service: Cardiovascular;  Laterality: N/A;    Current Medications: Current Meds  Medication Sig  . acetaminophen (TYLENOL) 325 MG tablet Take 650 mg by mouth every 6 (six) hours as needed for headache.  Marland Kitchen amLODipine (NORVASC) 10 MG tablet Take 1 tablet (10 mg total) by mouth daily.  Marland Kitchen aspirin 325 MG tablet Take 325 mg by mouth daily.  Marland Kitchen atorvastatin (LIPITOR) 40 MG tablet Take 1 tablet (40 mg total) by mouth daily at 6 PM.  . carvedilol (COREG) 25 MG tablet Take 1 tablet (25 mg total) by mouth 2 (two) times daily with a meal.  . cloNIDine (CATAPRES) 0.2 MG tablet Take one tablet twice daily (Patient taking differently: Take 0.2 mg by mouth 2 (two) times daily. )  . clopidogrel (PLAVIX) 75 MG tablet Take 1 tablet (75 mg total) by mouth daily.  . hydrALAZINE (APRESOLINE) 10 MG tablet Take 1 tablet (  10 mg total) by mouth every 8 (eight) hours.  . insulin NPH Human (NOVOLIN N) 100 UNIT/ML injection Inject 0.08 mLs (8 Units total) into the skin 2 (two) times daily with a meal.  . insulin regular (NOVOLIN R) 100 units/mL injection Inject 0.04 mLs (4 Units total) into the skin 3 (three) times daily before meals. May increase by 1 unit at each dose for glucose greater than 180 (Patient taking differently: Inject 4 Units into the skin See admin instructions. Sliding Scale: 4 units three times daily before meals, then increase by 1 unit at each dose for glucose greater than 180.)     Allergies:    Patient has no known allergies.   Social History   Socioeconomic History  . Marital status: Single    Spouse name: Not on file  . Number of children: Not on file  . Years of education: Not on file  . Highest education level: Not on file  Occupational History  . Not on file  Social Needs  . Financial resource strain: Not on file  . Food insecurity    Worry: Not on file    Inability: Not on file  . Transportation needs    Medical: Not on file    Non-medical: Not on file  Tobacco Use  . Smoking status: Current Every Day Smoker    Packs/day: 0.50    Types: Cigarettes    Start date: 12/29/2010  . Smokeless tobacco: Never Used  Substance and Sexual Activity  . Alcohol use: Not Currently    Comment: 6 beers/week  . Drug use: Not Currently  . Sexual activity: Not on file  Lifestyle  . Physical activity    Days per week: Not on file    Minutes per session: Not on file  . Stress: Not on file  Relationships  . Social Herbalist on phone: Not on file    Gets together: Not on file    Attends religious service: Not on file    Active member of club or organization: Not on file    Attends meetings of clubs or organizations: Not on file    Relationship status: Not on file  Other Topics Concern  . Not on file  Social History Narrative  . Not on file     Family History: The patient's family history includes Diabetes in his father and mother; Hyperlipidemia in his mother; Hypertension in his mother.  ROS:   Please see the history of present illness.     All other systems reviewed and are negative.  EKGs/Labs/Other Studies Reviewed:    The following studies were reviewed today:  Transthoracic echocardiogram date 06/15/2019 IMPRESSIONS   1. The left ventricle has low normal systolic function, with an ejection fraction of 50-55%. The cavity size was normal. Indeterminate diastolic filling due to E-A fusion.  2. The right ventricle has normal systolic function. The  cavity was normal. There is no increase in right ventricular wall thickness.  3. Atrial septal color Doppler challenging to interpreter. Cannot exclude PFO though no definite L to R shunt detected. Consider transthoracic agitated saline exam.  4. No intracardiac thrombi or masses were visualized.   Trans esophageal echocardiogram date 06/20/2019 IMPRESSIONS  1. The cavity size was normal. There is mildly increased left ventricular wall thickness.  2. LA, LAA without masses.  3. No PFO by color doppler or as tested with injection of agitated saline.  4. The aortic valve is tricuspid.  5. The  aortic root, ascending aorta, aortic arch and descending aorta are normal in size and structure.  EKG:  EKG is  ordered today.  The ekg ordered today demonstrates normal sinus rhythm, right axis deviation, otherwise normal ECG.  Recent Labs: 09/23/2019: TSH 1.268 09/26/2019: ALT 18; BUN 32; Creatinine, Ser 3.02; Hemoglobin 11.7; Magnesium 2.0; Platelets 169; Potassium 4.0; Sodium 139  Recent Lipid Panel    Component Value Date/Time   CHOL 156 06/27/2019 1033   TRIG 46 06/27/2019 1033   HDL 76 06/27/2019 1033   CHOLHDL 2.1 06/27/2019 1033   CHOLHDL 4.1 06/13/2019 0251   VLDL 60 (H) 06/13/2019 0251   LDLCALC 71 06/27/2019 1033    Physical Exam:    VS:  BP 120/80 (BP Location: Right Arm, Patient Position: Sitting, Cuff Size: Normal)   Pulse 81   Temp 98 F (36.7 C)   Ht 5\' 3"  (1.6 m)   Wt 126 lb (57.2 kg)   SpO2 99%   BMI 22.32 kg/m     Wt Readings from Last 3 Encounters:  09/30/19 126 lb (57.2 kg)  09/23/19 120 lb 13 oz (54.8 kg)  09/19/19 128 lb 12.8 oz (58.4 kg)     GEN:  Well nourished, well developed in no acute distress HEENT: Normal NECK: No JVD; No carotid bruits LYMPHATICS: No lymphadenopathy CARDIAC: RRR, no murmurs, rubs, gallops RESPIRATORY:  Clear to auscultation without rales, wheezing or rhonchi  ABDOMEN: Soft, non-tender, non-distended MUSCULOSKELETAL:  No edema;  No deformity  SKIN: Warm and dry NEUROLOGIC:  Alert and oriented x 3 PSYCHIATRIC:  Normal affect   ASSESSMENT:   Reasons for stroke school include smoking history and hypertension.  He denies palpitations and no objective evidence of atrial flutter or flutter documented. 1. History of CVA (cerebrovascular accident)   2. Essential hypertension   3. Smoking    PLAN:    In order of problems listed above:  1. History of CVA, no cardioembolic source found.  He denies palpitations.  We will place a ZIO patch x2 weeks to see if there is any episode of A. fib or flutter. 2. Blood pressure well controlled continue current blood pressure management. 3. The patient was counseled on tobacco cessation today for 20 minutes.  Counseling included reviewing the risks of smoking tobacco products, how it impacts the patient's current medical diagnoses and different strategies for quitting.  Pharmacotherapy to aid in tobacco cessation was not prescribed today.   Medication Adjustments/Labs and Tests Ordered: Current medicines are reviewed at length with the patient today.  Concerns regarding medicines are outlined above.  Orders Placed This Encounter  Procedures  . LONG TERM MONITOR (3-14 DAYS)  . EKG 12-Lead   No orders of the defined types were placed in this encounter.   Patient Instructions  Medication Instructions:  Your physician recommends that you continue on your current medications as directed. Please refer to the Current Medication list given to you today. If you need a refill on your cardiac medications before your next appointment, please call your pharmacy.   Lab work: None ordered  If you have labs (blood work) drawn today and your tests are completely normal, you will receive your results only by: Marland Kitchen MyChart Message (if you have MyChart) OR . A paper copy in the mail If you have any lab test that is abnormal or we need to change your treatment, we will call you to review the  results.  Testing/Procedures: 1- A zio monitor was placed today. It will  remain on for 14 days. You will then return monitor and event diary in provided box. It takes 1-2 weeks for report to be downloaded and returned to [place]. We will call you with the results. If monitor falls of or has orange flashing light, please call Zio for further instructions.    Follow-Up: At [facility], you and your health needs are our priority.  As part of our continuing mission to provide you with exceptional heart care, we have created designated Provider Care Teams.  These Care Teams include your primary Cardiologist (physician) and Advanced Practice Providers (APPs -  Physician Assistants and Nurse Practitioners) who all work together to provide you with the care you need, when you need it. You will need a follow up appointment in 6 weeks.  You may see Kate Sable, MD or one of the following Advanced Practice Providers on your designated Care Team:   Murray Hodgkins, NP Christell Faith, PA-C . Marrianne Mood, PA-C      Signed, Kate Sable, MD  09/30/2019 3:34 PM    [place]

## 2019-09-30 NOTE — Patient Instructions (Signed)
Medication Instructions:  Your physician recommends that you continue on your current medications as directed. Please refer to the Current Medication list given to you today. If you need a refill on your cardiac medications before your next appointment, please call your pharmacy.   Lab work: None ordered  If you have labs (blood work) drawn today and your tests are completely normal, you will receive your results only by: Marland Kitchen MyChart Message (if you have MyChart) OR . A paper copy in the mail If you have any lab test that is abnormal or we need to change your treatment, we will call you to review the results.  Testing/Procedures: 1- A zio monitor was placed today. It will remain on for 14 days. You will then return monitor and event diary in provided box. It takes 1-2 weeks for report to be downloaded and returned to [place]. We will call you with the results. If monitor falls of or has orange flashing light, please call Zio for further instructions.    Follow-Up: At [facility], you and your health needs are our priority.  As part of our continuing mission to provide you with exceptional heart care, we have created designated Provider Care Teams.  These Care Teams include your primary Cardiologist (physician) and Advanced Practice Providers (APPs -  Physician Assistants and Nurse Practitioners) who all work together to provide you with the care you need, when you need it. You will need a follow up appointment in 6 weeks.  You may see Kate Sable, MD or one of the following Advanced Practice Providers on your designated Care Team:   Murray Hodgkins, NP Christell Faith, PA-C . Marrianne Mood, PA-C

## 2019-10-03 ENCOUNTER — Other Ambulatory Visit: Payer: Self-pay

## 2019-10-03 ENCOUNTER — Ambulatory Visit: Payer: Medicaid Other | Attending: Family Medicine | Admitting: Pharmacist

## 2019-10-03 DIAGNOSIS — E1022 Type 1 diabetes mellitus with diabetic chronic kidney disease: Secondary | ICD-10-CM | POA: Diagnosis not present

## 2019-10-03 DIAGNOSIS — N183 Chronic kidney disease, stage 3 unspecified: Secondary | ICD-10-CM | POA: Diagnosis not present

## 2019-10-03 LAB — GLUCOSE, POCT (MANUAL RESULT ENTRY): POC Glucose: 202 mg/dl — AB (ref 70–99)

## 2019-10-03 NOTE — Patient Instructions (Signed)
Thank you for coming to see me today. Please do the following:  1. Increase afternoon "12-hr" shot to 10 units. Keep the morning at 8. 2. Continue pre-meal sliding scale. 3. Continue checking blood sugars at home. It's really important that you record these and bring these in to your next doctor's appointment. If you get in readings above 500 or lower than 70, call me or the clinic to let your doctor know. See below on how to treat low blood sugar.  4. Continue making the lifestyle changes we've discussed together during our visit. Diet and exercise play a significant role in improving your blood sugars.  5. Follow-up with me in 1 month.    Hypoglycemia or low blood sugar:   Low blood sugar can happen quickly and may become an emergency if not treated right away.   While this shouldn't happen often, it can be brought upon if you skip a meal or do not eat enough. Also, if your insulin or other diabetes medications are dosed too high, this can cause your blood sugar to go to low.   Warning signs of low blood sugar include: 1. Feeling shaky or dizzy 2. Feeling weak or tired  3. Excessive hunger 4. Feeling anxious or upset  5. Sweating even when you aren't exercising  What to do if I experience low blood sugar? 1. Check your blood sugar with your meter. If lower than 70, proceed to step 2.  2. Treat with 3-4 glucose tablets or 3 packets of regular sugar. If these aren't around, you can try hard candy. Yet another option would be to drink 4 ounces of fruit juice or 6 ounces of REGULAR soda.  3. Re-check your sugar in 15 minutes. If it is still below 70, do what you did in step 2 again. If has come back up, go ahead and eat a snack or small meal at this time.

## 2019-10-03 NOTE — Progress Notes (Signed)
S:     No chief complaint on file.   Patient arrives in good spirits.  Presents for diabetes evaluation, education, and management Patient was referred and last seen by Primary Care Provider on 09/19/19. Since then, he was admitted to [facility] from 9/25-9/28/20 with AMS, DKA complicated by hypoglycemia, new acute strokes, AKI and leukocytosis.    Family/Social History:  - FHx: T1DM in father, HLD & HTN (mother) - Tobacco: current every day smoker (0.5 PPD) - Alcohol: drinks 6 beers/week  Insurance coverage/medication affordability: self pay  Patient reports adherence with medications.  Current diabetes medications include: NPH 8 units BID; sliding scale regular Current hypertension medications include: amlodipine 10 mg daily, carvedilol 25 mg BID, clonidine 0.2 mg BID, hydralazine 10 mg TID Current hyperlipidemia medications include: atorvastatin 40 mg daily  Patient reports hypoglycemic events. Gives several readings in the 60-70s since hospitalization.   Patient reported dietary habits:  - Pt reports limiting carbs mostly - Does endorse occasionally eating what he's not supposed to  - Has been educated concerning diabetic diet   Patient-reported exercise habits: denies    Patient reports occasional polydipsia.  Patient reports neuropathy. Patient reports visual changes with hypoglycemia. Patient reports self foot exams.    O:  POCT: 202  Lab Results  Component Value Date   HGBA1C 7.3 (H) 09/24/2019   There were no vitals filed for this visit.  Lipid Panel     Component Value Date/Time   CHOL 156 06/27/2019 1033   TRIG 46 06/27/2019 1033   HDL 76 06/27/2019 1033   CHOLHDL 2.1 06/27/2019 1033   CHOLHDL 4.1 06/13/2019 0251   VLDL 60 (H) 06/13/2019 0251   LDLCALC 71 06/27/2019 1033   Home fasting CBG: 200 - 250s   2 hour post-prandial/random CBG: 60s - 180s.  Clinical ASCVD: Yes  The ASCVD Risk score Mikey Bussing DC Jr., et al., 2013) failed to calculate for  the following reasons:   The 2013 ASCVD risk score is only valid for ages 28 to 13   The patient has a prior MI or stroke diagnosis    A/P: Diabetes longstanding currently uncontrolled complicated by CKD, hx of CVA with recent new strokes, and DKA. Aggressiveness of therapy used to achieve glycemic control limited d/t hypoglycemia. Patient is able to verbalize appropriate hypoglycemia management plan. Patient is adherent with medication.  In this setting patient needs Endo referral. Pt is reluctant to change therapy given his trying several insulin regimens with wide glycemic variability at home. Would like to place him on longer acting insulin to minimize risk of hypoglycemia and achieve better control.  However, will stick with NPH-R regimen for now. Pt advised to increase afternoon NPH injection to 10 units. He will continue 8 units in the morning with R insulin sliding scale/correction. Recommend PCP follow-up before seeing me again.  -Increased dose of NPH to 8 units in the morning and 10 units in the evening.  -Continued  Insulin-R sliding scale/correction. -Extensively discussed pathophysiology of DM, recommended lifestyle interventions, dietary effects on glycemic control -Counseled on s/sx of and management of hypoglycemia -Next A1C anticipated 11/2019.   ASCVD risk - secondary prevention in patient with DM. Last LDL is close to goal of <70. High intensity statin indicated. Per hospital d/c summary, Neuro was consulted. Plan for ASA+Plavix x21 days then Plavix alone. -Continue atorvastatin 40 mg daily -Continue anti-platelet therapy per Neuro.   Written patient instructions provided.  Total time in face to face counseling  30 minutes.   Follow up Pharmacist Clinic Visit in 1 month.    Benard Halsted, PharmD, [place] [phone]

## 2019-10-04 ENCOUNTER — Encounter: Payer: Self-pay | Admitting: Pharmacist

## 2019-10-05 DIAGNOSIS — I639 Cerebral infarction, unspecified: Secondary | ICD-10-CM | POA: Diagnosis not present

## 2019-10-05 DIAGNOSIS — N183 Chronic kidney disease, stage 3 unspecified: Secondary | ICD-10-CM | POA: Diagnosis not present

## 2019-10-05 DIAGNOSIS — N2581 Secondary hyperparathyroidism of renal origin: Secondary | ICD-10-CM | POA: Diagnosis not present

## 2019-10-05 DIAGNOSIS — R809 Proteinuria, unspecified: Secondary | ICD-10-CM | POA: Diagnosis not present

## 2019-10-05 DIAGNOSIS — N189 Chronic kidney disease, unspecified: Secondary | ICD-10-CM | POA: Diagnosis not present

## 2019-10-05 DIAGNOSIS — D631 Anemia in chronic kidney disease: Secondary | ICD-10-CM | POA: Diagnosis not present

## 2019-10-05 DIAGNOSIS — I129 Hypertensive chronic kidney disease with stage 1 through stage 4 chronic kidney disease, or unspecified chronic kidney disease: Secondary | ICD-10-CM | POA: Diagnosis not present

## 2019-10-07 ENCOUNTER — Other Ambulatory Visit: Payer: Self-pay

## 2019-10-07 ENCOUNTER — Ambulatory Visit: Payer: Medicaid Other | Attending: Family Medicine | Admitting: Family Medicine

## 2019-10-07 ENCOUNTER — Encounter: Payer: Self-pay | Admitting: Family Medicine

## 2019-10-07 ENCOUNTER — Telehealth: Payer: Self-pay | Admitting: Cardiology

## 2019-10-07 DIAGNOSIS — R29818 Other symptoms and signs involving the nervous system: Secondary | ICD-10-CM

## 2019-10-07 DIAGNOSIS — E1069 Type 1 diabetes mellitus with other specified complication: Secondary | ICD-10-CM

## 2019-10-07 DIAGNOSIS — E1022 Type 1 diabetes mellitus with diabetic chronic kidney disease: Secondary | ICD-10-CM | POA: Diagnosis not present

## 2019-10-07 DIAGNOSIS — I639 Cerebral infarction, unspecified: Secondary | ICD-10-CM

## 2019-10-07 DIAGNOSIS — I10 Essential (primary) hypertension: Secondary | ICD-10-CM

## 2019-10-07 DIAGNOSIS — E785 Hyperlipidemia, unspecified: Secondary | ICD-10-CM | POA: Diagnosis not present

## 2019-10-07 DIAGNOSIS — N183 Chronic kidney disease, stage 3 unspecified: Secondary | ICD-10-CM | POA: Diagnosis not present

## 2019-10-07 DIAGNOSIS — Z7982 Long term (current) use of aspirin: Secondary | ICD-10-CM | POA: Diagnosis not present

## 2019-10-07 DIAGNOSIS — I63532 Cerebral infarction due to unspecified occlusion or stenosis of left posterior cerebral artery: Secondary | ICD-10-CM | POA: Diagnosis not present

## 2019-10-07 MED ORDER — PANTOPRAZOLE SODIUM 40 MG PO TBEC: 40.0000 mg | DELAYED_RELEASE_TABLET | Freq: Every day | ORAL | 11 refills | Status: DC

## 2019-10-07 MED ORDER — FERROUS SULFATE 325 (65 FE) MG PO TABS: 325.0000 mg | ORAL_TABLET | Freq: Every day | ORAL | 6 refills | Status: DC

## 2019-10-07 MED ORDER — CLONIDINE HCL 0.2 MG PO TABS: 0.2000 mg | ORAL_TABLET | Freq: Two times a day (BID) | ORAL | 5 refills | Status: DC

## 2019-10-07 MED ORDER — AMLODIPINE BESYLATE 10 MG PO TABS: 10.0000 mg | ORAL_TABLET | Freq: Every day | ORAL | 5 refills | Status: DC

## 2019-10-07 NOTE — Telephone Encounter (Signed)
Recieved request from : [facility]  Forwarded to [employer] for processing

## 2019-10-07 NOTE — Progress Notes (Signed)
Virtual Visit via Telephone Note  I connected with George [name] on 10/07/19 at  2:30 PM EDT by telephone and verified that I am speaking with the correct person using two identifiers.   I discussed the limitations, risks, security and privacy concerns of performing an evaluation and management service by telephone and the availability of in person appointments. I also discussed with the patient that there may be a patient responsible charge related to this service. The patient expressed understanding and agreed to proceed.  Patient Location: Home Provider Location: Home Office Others participating in call: none   History of Present Illness:        33 year old male who was last seen in the office by his PCP on 09/19/2019 who is now status post hospitalization 09/23/2019 through 09/26/2019 due to diabetic ketoacidosis, acute kidney injury superimposed on stage III chronic kidney disease and history of CVA in June of this year.  Patient presented to the emergency department with complaint of a few days of nausea and vomiting and patient with altered mental status.  Patient with glucose level of 532 with bicarb of 18 and anion gap of 24.  He had CT scan showing 2-3 rounded oval regions of low attenuation in the cerebral hemisphere which had not been seen on previous studies and these new areas could represent small interval lacunar infarcts due to embolic disease. He did have neurology evaluation during his hospitalization.        He reports that he feels that his health is stable at this time.  He is recently seen cardiology and his blood pressure was within normal.  He denies headaches or dizziness related to his blood pressure.  He does have some issues with recall status post most recent CVA.  Home blood sugars are remaining below 120 fasting and in the 160s to 180s later in the day after eating.  He denies any current issues with increased thirst or frequent urination.  He has had no sensation of  chest pain or palpitations.  He does have some mild acid reflux symptoms.  He continues to take daily 325 mg aspirin.  He denies any unusual bruising or bleeding.  He continues to take his cholesterol medicine and he does not feel as if he is having any increased muscle or joint pain.  He does have some fatigue.  He needs refills of some medications at today's visit.  He is aware of the need to follow-up with nephrology.   Past Medical History:  Diagnosis Date  . AKI (acute kidney injury) ([place])   . Cerebral infarction ([place])   . Hypertension   . Neurologic deficit due to acute ischemic cerebrovascular accident (CVA) ([place]) 06/13/2019  . Supraventricular tachycardia ([place])   . Type 1 diabetes [facility])     Past Surgical History:  Procedure Laterality Date  . BUBBLE STUDY  06/20/2019   Procedure: BUBBLE STUDY;  Surgeon: Fay Records, MD;  Location: [place];  Service: Cardiovascular;;  . TEE WITHOUT CARDIOVERSION N/A 06/20/2019   Procedure: TRANSESOPHAGEAL ECHOCARDIOGRAM (TEE);  Surgeon: Fay Records, MD;  Location: [facility] ENDOSCOPY;  Service: Cardiovascular;  Laterality: N/A;    Family History  Problem Relation Age of Onset  . Hypertension Mother   . Diabetes Mother   . Hyperlipidemia Mother   . Diabetes Father     Social History   Tobacco Use  . Smoking status: Current Every Day Smoker    Packs/day: 0.50    Types: Cigarettes  Start date: 12/29/2010  . Smokeless tobacco: Never Used  Substance Use Topics  . Alcohol use: Not Currently    Comment: 6 beers/week  . Drug use: Not Currently     No Known Allergies     Observations/Objective: No vital signs or physical exam conducted as visit was done via telephone  Assessment and Plan: 1. Stage 3 chronic kidney disease, unspecified whether stage 3a or 3b CKD; anemia of chronic disease Per patient's recent hospital hospital discharge notes, he is to follow-up with nephrology.  On day of his recent hospital admission, his creatinine was  4.11 which improved to 3.02 on day of discharge and patient with baseline creatinine of 2.2-2.4.  He also has anemia of chronic disease and refill provided for his ferrous sulfate.  Hemoglobin on 09/26/2019 was 11.7.  He is aware of the need to make sure that his blood pressure and blood sugars remain controlled and to avoid nonsteroidal anti-inflammatories or other medicines which could worsen renal function. - ferrous sulfate 325 (65 FE) MG tablet; Take 1 tablet (325 mg total) by mouth daily with breakfast.  Dispense: 30 tablet; Refill: 6  2. Type 1 diabetes mellitus with stage 3 chronic kidney disease, unspecified whether stage 3a or 3b CKD (HCC) Recent hemoglobin A1c during hospitalization was actually good at 7.3 and patient's insulin was adjusted as he had had some hypoglycemic episodes.  He reports that he is taking his insulin and monitoring his blood sugars and they remain controlled at this time.  He is to keep his follow-up appointment with his endocrinologist.  3. Neurologic deficit due to acute ischemic cerebrovascular accident (CVA) ([place]); 6.  Cerebrovascular accident due to occlusion of left posterior cerebral artery He is status post left PCA infarct in June and at recent hospitalization he was found to have acute infarct of the right parietal cortex and subacute infarct of the right cerebellum.  He is to keep follow-up with neurology as an outpatient.  Per hospital discharge, patient was discharged on 3 to 25 mg aspirin and Plavix but was to discontinue the Plavix after 21 days.  He has had cardiology follow-up on 09/30/2019 regarding hypertension and to look for possible source of his CVA.  He had placement of a Zio patch for 2 weeks to look for atrial fibrillation or atrial flutter.  4. Essential hypertension He was recently seen by cardiology status post hospital discharge and blood pressure at that visit was 120/80 per cardiology note.  He is on a regimen of amlodipine 10 mg, Coreg 25 mg  twice daily, hydralazine 10 mg 3 times daily and clonidine 0.2 mg twice daily as per hospital discharge.  Amlodipine refill provided at today's visit - amLODipine (NORVASC) 10 MG tablet; Take 1 tablet (10 mg total) by mouth daily.  Dispense: 30 tablet; Refill: 5  5. Hyperlipidemia due to type 1 diabetes mellitus (HCC) Continue atorvastatin 40 mg daily and low-fat diet due to diabetes and history of CVA.  7. Long term current use of aspirin He is currently on long-term aspirin 325 mg after hospitalizations in June and August of this year for CVAs.  Prescription provided for Protonix 40 mg daily to help with prevention of gastritis/ulcer due to long-term aspirin use. - pantoprazole (PROTONIX) 40 MG tablet; Take 1 tablet (40 mg total) by mouth daily. For stomach protection  Dispense: 30 tablet; Refill: 11  Follow Up Instructions:Return for chronic issues in 4-6 weeks.    I discussed the assessment and treatment plan with  the patient. The patient was provided an opportunity to ask questions and all were answered. The patient agreed with the plan and demonstrated an understanding of the instructions.   The patient was advised to call back or seek an in-person evaluation if the symptoms worsen or if the condition fails to improve as anticipated.  I provided 15 minutes of non-face-to-face time during this encounter.  Additional 10 minutes was spent on review of chart regarding recent hospitalization, specialty follow-up, labs and imaging with an additional 5 minutes for creation of today's encounter note.  Antony Blackbird, MD

## 2019-10-14 ENCOUNTER — Telehealth: Payer: Self-pay | Admitting: Critical Care Medicine

## 2019-10-14 NOTE — Telephone Encounter (Signed)
Would one of you be able to contact the patient/family regarding the issues with hypoglycemia

## 2019-10-14 NOTE — Telephone Encounter (Signed)
Pt self-treated successfully for hypoglycemia yesterday. He reports that he has only experienced that one even since seeing me earlier in the month. He reports that his blood sugar was been relatively well-controlled. Hypoglycemia management was discussed. Pt denied skipping meals but was encouraged to eat regularly.

## 2019-10-14 NOTE — Telephone Encounter (Signed)
New Message   Pt's mom is calling state the pt's sugar level was low yesterday and it took them 3-4 hours to get it back up. They are wanting to speak with a nurse to get an idea on what they should do if it happens again. Please f/u

## 2019-10-31 ENCOUNTER — Telehealth: Payer: Self-pay | Admitting: Critical Care Medicine

## 2019-10-31 ENCOUNTER — Other Ambulatory Visit: Payer: Self-pay

## 2019-10-31 NOTE — Telephone Encounter (Signed)
1) Medication(s) Requested (by name): -hydrALAZINE (APRESOLINE) 10 MG tablet    2) Pharmacy of Choice: [facility] [place], [place]

## 2019-11-01 ENCOUNTER — Other Ambulatory Visit: Payer: Self-pay | Admitting: Pharmacist

## 2019-11-01 MED ORDER — HYDRALAZINE HCL 10 MG PO TABS: 10.0000 mg | ORAL_TABLET | Freq: Three times a day (TID) | ORAL | 2 refills | Status: DC

## 2019-11-03 ENCOUNTER — Other Ambulatory Visit: Payer: Self-pay

## 2019-11-03 ENCOUNTER — Ambulatory Visit: Payer: Medicaid Other | Attending: Family Medicine | Admitting: Pharmacist

## 2019-11-03 DIAGNOSIS — E1022 Type 1 diabetes mellitus with diabetic chronic kidney disease: Secondary | ICD-10-CM | POA: Diagnosis not present

## 2019-11-03 DIAGNOSIS — N183 Chronic kidney disease, stage 3 unspecified: Secondary | ICD-10-CM | POA: Diagnosis not present

## 2019-11-03 NOTE — Patient Instructions (Signed)
Thank you for coming to see me today. Please do the following:  1. Increase evening long-acting shot to 12 units.  2. Continue 8 units in the morning.  3. Continue sliding scale.  4. Remember, 80-130 is not too low to use hypoglycemia treatment. I would suggest I high protein snack before bedtime to avoid dropping overnight.  5. Continue checking blood sugars at home. 6. Continue making the lifestyle changes we've discussed together during our visit. Diet and exercise play a significant role in improving your blood sugars.  7. Follow-up with Dr. Joya Gaskins in December.    Hypoglycemia or low blood sugar:   Low blood sugar can happen quickly and may become an emergency if not treated right away.   While this shouldn't happen often, it can be brought upon if you skip a meal or do not eat enough. Also, if your insulin or other diabetes medications are dosed too high, this can cause your blood sugar to go to low.   Warning signs of low blood sugar include: 1. Feeling shaky or dizzy 2. Feeling weak or tired  3. Excessive hunger 4. Feeling anxious or upset  5. Sweating even when you aren't exercising  What to do if I experience low blood sugar? 1. Check your blood sugar with your meter. If lower than 70, proceed to step 2.  2. Treat with 3-4 glucose tablets or 3 packets of regular sugar. If these aren't around, you can try hard candy. Yet another option would be to drink 4 ounces of fruit juice or 6 ounces of REGULAR soda.  3. Re-check your sugar in 15 minutes. If it is still below 70, do what you did in step 2 again. If has come back up, go ahead and eat a snack or small meal at this time.

## 2019-11-03 NOTE — Progress Notes (Signed)
    S:     No chief complaint on file.   Patient arrives in good spirits.  Presents for diabetes evaluation, education, and management Patient was referred and last seen by Primary Care Provider on 10/07/19.   Family/Social History:  - FHx: T1DM in father, HLD & HTN (mother) - Tobacco: current every day smoker (< 1 pack per week) - Alcohol: denies drinking alcohol   Insurance coverage/medication affordability: self pay  Patient reports adherence with medications.  Current diabetes medications include: NPH 8 units qAM/10 units PM; sliding scale regular Current hypertension medications include: amlodipine 10 mg daily, carvedilol 25 mg BID, clonidine 0.2 mg BID, hydralazine 10 mg TID Current hyperlipidemia medications include: atorvastatin 40 mg daily  Patient denies hypoglycemic events.   Patient reported dietary habits:  - Pt reports limiting carbs mostly - Does endorse occasionally eating what he's not supposed to  - Has been educated concerning diabetic diet   Patient-reported exercise habits: walks 30 minutes/daily   Patient reports polydipsia.  Patient reports neuropathy. Patient reports visual changes with hypoglycemia. Patient reports self foot exams.    O:  POCT: 323 (woke up this morning with 385) - has not eaten this morning but reports eating a burger, sweet potato fries and oreos last night along with Pepsi before bed out of fear of hypoglycemia.   Lab Results  Component Value Date   HGBA1C 7.3 (H) 09/24/2019   There were no vitals filed for this visit.  Lipid Panel     Component Value Date/Time   CHOL 156 06/27/2019 1033   TRIG 46 06/27/2019 1033   HDL 76 06/27/2019 1033   CHOLHDL 2.1 06/27/2019 1033   CHOLHDL 4.1 06/13/2019 0251   VLDL 60 (H) 06/13/2019 0251   LDLCALC 71 06/27/2019 1033   Home fasting CBG: 180s -250 2 hour post-prandial/random CBG: 100s before bed  Clinical ASCVD: Yes  The ASCVD Risk score Mikey Bussing DC Jr., et al., 2013) failed to  calculate for the following reasons:   The 2013 ASCVD risk score is only valid for ages 50 to 42   The patient has a prior MI or stroke diagnosis   A/P: Diabetes longstanding currently uncontrolled complicated by CKD, hx of CVA with recent new strokes, and DKA. Aggressiveness of therapy used to achieve glycemic control limited d/t hypoglycemia. Patient is able to verbalize appropriate hypoglycemia management plan. Patient is adherent with medication.  -Increased dose of NPH to 8 units in the morning and 12 units in the evening.  -Continued  Insulin-R sliding scale/correction. -Eat a high protein snack in the evening before bed. -Do not treat for hypoglycemia preemptively. -Extensively discussed pathophysiology of DM, recommended lifestyle interventions, dietary effects on glycemic control -Counseled on s/sx of and management of hypoglycemia -Next A1C anticipated 11/2019.   ASCVD risk - secondary prevention in patient with DM. Last LDL is close to goal of <70. High intensity statin indicated. Per hospital d/c summary, Neuro was consulted. Plan for ASA+Plavix x21 days then Plavix alone. -Continue atorvastatin 40 mg daily -Continue anti-platelet therapy per Neuro.   Written patient instructions provided.  Total time in face to face counseling 30 minutes.   Follow up Pharmacist Clinic Visit in 1 month.    Benard Halsted, PharmD, [place] [phone]

## 2019-11-09 ENCOUNTER — Ambulatory Visit (INDEPENDENT_AMBULATORY_CARE_PROVIDER_SITE_OTHER): Payer: Medicaid Other | Admitting: Adult Health

## 2019-11-09 ENCOUNTER — Encounter: Payer: Self-pay | Admitting: Adult Health

## 2019-11-09 ENCOUNTER — Other Ambulatory Visit: Payer: Self-pay

## 2019-11-09 VITALS — BP 137/78 | HR 84 | Temp 97.8°F | Wt 129.6 lb

## 2019-11-09 DIAGNOSIS — Z720 Tobacco use: Secondary | ICD-10-CM

## 2019-11-09 DIAGNOSIS — I151 Hypertension secondary to other renal disorders: Secondary | ICD-10-CM | POA: Diagnosis not present

## 2019-11-09 DIAGNOSIS — I639 Cerebral infarction, unspecified: Secondary | ICD-10-CM

## 2019-11-09 DIAGNOSIS — E1022 Type 1 diabetes mellitus with diabetic chronic kidney disease: Secondary | ICD-10-CM

## 2019-11-09 DIAGNOSIS — N2889 Other specified disorders of kidney and ureter: Secondary | ICD-10-CM

## 2019-11-09 DIAGNOSIS — H547 Unspecified visual loss: Secondary | ICD-10-CM

## 2019-11-09 DIAGNOSIS — E785 Hyperlipidemia, unspecified: Secondary | ICD-10-CM

## 2019-11-09 DIAGNOSIS — E1021 Type 1 diabetes mellitus with diabetic nephropathy: Secondary | ICD-10-CM

## 2019-11-09 DIAGNOSIS — N1831 Chronic kidney disease, stage 3a: Secondary | ICD-10-CM

## 2019-11-09 MED ORDER — ROSUVASTATIN CALCIUM 20 MG PO TABS: 20.0000 mg | ORAL_TABLET | Freq: Every day | ORAL | 0 refills | Status: DC

## 2019-11-09 NOTE — Progress Notes (Signed)
[facility] [address] [place] [address] [phone]       [place]  Mr. Jyair Hammersley Date of Birth:  Oct 08, 1986 Medical Record Number:  [id number]   Reason for Referral: Cryptogenic strokes    CHIEF COMPLAINT:  Chief Complaint  Patient presents with   Follow-up    follow up for stroke room 9 with harriet and her temp is 97.7    HPI: Stroke admission 06/12/2019: Mr. Rhylin Noska is a 33 y.o. male with history of type I DB presented on 06/12/2019 with receptive aphasia and word salad responses, not oriented with HA.  Stroke work-up revealed large left PCA infarct embolic pattern secondary to large vessel disease versus cardioembolic source.  Initial CT head showed large left PCA infarct.  MRI brain showed large left PCA infarct with petechial hemorrhage left occipital lobe HI 1.  Carotid Doppler and 2D echo unremarkable.  Lower extremity venous Dopplers negative for DVT.  TEE did not show evidence of PFO or cardiac source of embolus.  Recommended 30-day cardiac event monitor outpatient to assess for atrial fibrillation.  Hypercoagulable/vasculitis work-up negative.  LDL 102 and A1c 8.4.  Recommended DAPT for 3 months then aspirin alone due to intracranial stenosis.  No prior history of HTN with elevation during admission and started on amlodipine 10 mg and metoprolol 50 mg twice daily.  Also found to have CKD, stage III with referral to nephrology.  Initiated atorvastatin 40 mg daily.  Advised to follow-up with PCP for uncontrolled DM.  Initial finding of leukocytosis improved during admission with work-up unremarkable and likely reactive.  Other stroke risk factors include EtOH use, THC use and tobacco use.  He was discharged home in stable condition with residual aphasia.  Initial visit 07/26/2019: Residual deficits expressive aphasia and delayed recall.  He does endorse overall improvement with only occasional word finding difficulty.  He  is currently in the process of obtaining insurance therefore therapies were not started.  Has returned to playing video games with improvement - was having difficulty initially  He continues to live at home with his mother but is able to perform all prior activities He does have complaints of swelling bilateral lower extremity swelling - started once he started his new prescriptions; he does have appointment next Monday with Nephrology for initial evaluation due to elevated creatinine and CKD stage III Continues on aspirin and Plavix without bleeding or bruising Continues on atorvastatin without myalgias -recent lipid panel by PCP LDL 71 Blood pressure stable at 139/83 No further concerns at this time  Update 11/09/2019: Mr. Cotugno is a 33 year old male who is being seen today for recent hospital discharge along with prior stroke follow-up accompanied by his mother.  He presented to the ED on 09/25/2019 with AMS, DKA and agitation.  Stroke work-up showed multiple bilateral infarcts embolic pattern as evidenced on MRI cryptogenic etiology.  MRI showed 5 mm acute infarct right parietal cortex over the convexity, small area of subacute infarct right cerebellum, chronic infarct left cerebellum and chronic hemorrhagic infarct left occipital lobe.  TEE during prior admission did not show evidence of PFO or cardiac source of embolus and recommended consideration of transthoracic agitated saline exam.  LDL 71.  A1c 7.3.  USD positive for THC.  Due to lack of insurance, he did admit to being noncompliant with medications.  Recommended DAPT for 3 weeks and Plavix alone.  Blood pressure elevated during admission and recommended long-term BP goal normotensive range.  Uncontrolled  DM and needs follow-up with PCP.  Other stroke risk factors include current tobacco use, THC use, previous EtOH use, history of stroke, family history of stroke and prior substance abuse.  Discharged home in stable condition.  He has been  stable since hospital discharge.  He does continue to have decreased vision and balance deficit which slightly worsened since recent stroke but has been improving.  He also endorses L>R lower extremity weakness/fatigue feeling along with muscle and joint aching.  He has continued on aspirin and Plavix without bleeding or bruising.  Continues on atorvastatin with possible statin myalgias.  He was seen by cardiology on 09/30/2019 and recommended 14-day cardiac monitor to assess for episodes of A. fib or flutter which was unremarkable.  Continues to be followed by cardiology for HTN management.  Does not routinely monitor blood pressure at home with today's reading 137/78.  At times he can feel lightheaded sensation/dizziness shortly after taking hydralazine dosage but resolves fairly quickly.  He does continue to use tobacco and THC with attempts of quitting.  Continues to live at home with his mother and currently in the process of Medicaid approval and disability.  No further concerns at this time.    ROS:   14 system review of systems performed and negative with exception of vision deficit, balance difficulties, dizziness  PMH:  Past Medical History:  Diagnosis Date   AKI (acute kidney injury) ([place])    Cerebral infarction ([place])    Hypertension    Neurologic deficit due to acute ischemic cerebrovascular accident (CVA) ([place]) 06/13/2019   Supraventricular tachycardia ([place])    Type 1 diabetes ([place])     PSH:  Past Surgical History:  Procedure Laterality Date   BUBBLE STUDY  06/20/2019   Procedure: BUBBLE STUDY;  Surgeon: Fay Records, MD;  Location: [facility];  Service: Cardiovascular;;   TEE WITHOUT CARDIOVERSION N/A 06/20/2019   Procedure: TRANSESOPHAGEAL ECHOCARDIOGRAM (TEE);  Surgeon: Fay Records, MD;  Location: [facility] ENDOSCOPY;  Service: Cardiovascular;  Laterality: N/A;    Social History:  Social History   Socioeconomic History   Marital status: Single    Spouse name: Not on  file   Number of children: Not on file   Years of education: Not on file   Highest education level: Not on file  Occupational History   Not on file  Social Needs   Financial resource strain: Not on file   Food insecurity    Worry: Not on file    Inability: Not on file   Transportation needs    Medical: Not on file    Non-medical: Not on file  Tobacco Use   Smoking status: Current Every Day Smoker    Packs/day: 0.25    Types: Cigarettes    Start date: 12/29/2010   Smokeless tobacco: Never Used  Substance and Sexual Activity   Alcohol use: Not Currently    Comment: 6 beers/week   Drug use: Not Currently   Sexual activity: Not on file  Lifestyle   Physical activity    Days per week: Not on file    Minutes per session: Not on file   Stress: Not on file  Relationships   Social connections    Talks on phone: Not on file    Gets together: Not on file    Attends religious service: Not on file    Active member of club or organization: Not on file    Attends meetings of clubs or organizations: Not on file  Relationship status: Not on file   Intimate partner violence    Fear of current or ex partner: Not on file    Emotionally abused: Not on file    Physically abused: Not on file    Forced sexual activity: Not on file  Other Topics Concern   Not on file  Social History Narrative   Not on file    Family History:  Family History  Problem Relation Age of Onset   Hypertension Mother    Diabetes Mother    Hyperlipidemia Mother    Diabetes Father     Medications:   Current Outpatient Medications on File Prior to Visit  Medication Sig Dispense Refill   acetaminophen (TYLENOL) 325 MG tablet Take 650 mg by mouth every 6 (six) hours as needed for headache.     amLODipine (NORVASC) 10 MG tablet Take 1 tablet (10 mg total) by mouth daily. 30 tablet 5   aspirin 325 MG tablet Take 325 mg by mouth daily.     atorvastatin (LIPITOR) 40 MG tablet Take 1  tablet (40 mg total) by mouth daily at 6 PM. 90 tablet 1   carvedilol (COREG) 25 MG tablet Take 1 tablet (25 mg total) by mouth 2 (two) times daily with a meal. 180 tablet 1   cloNIDine (CATAPRES) 0.2 MG tablet Take 1 tablet (0.2 mg total) by mouth 2 (two) times daily. 60 tablet 5   clopidogrel (PLAVIX) 75 MG tablet Take 1 tablet (75 mg total) by mouth daily. 30 tablet 2   ferrous sulfate 325 (65 FE) MG tablet Take 1 tablet (325 mg total) by mouth daily with breakfast. 30 tablet 6   hydrALAZINE (APRESOLINE) 10 MG tablet Take 1 tablet (10 mg total) by mouth every 8 (eight) hours. 90 tablet 2   insulin NPH Human (NOVOLIN N) 100 UNIT/ML injection Inject 0.08 mLs (8 Units total) into the skin 2 (two) times daily with a meal. (Patient taking differently: Inject 8 Units into the skin 2 (two) times daily with a meal. Take 8 in the am and 12 at night) 10 mL 11   insulin regular (NOVOLIN R) 100 units/mL injection Inject 0.04 mLs (4 Units total) into the skin 3 (three) times daily before meals. May increase by 1 unit at each dose for glucose greater than 180 (Patient taking differently: Inject 4 Units into the skin See admin instructions. Sliding Scale: 4 units three times daily before meals, then increase by 1 unit at each dose for glucose greater than 180.) 10 mL 6   pantoprazole (PROTONIX) 40 MG tablet Take 1 tablet (40 mg total) by mouth daily. For stomach protection 30 tablet 11   No current facility-administered medications on file prior to visit.     Allergies:  No Known Allergies   Physical Exam  Vitals:   11/09/19 1437  BP: 137/78  Pulse: 84  Temp: 97.8 F (36.6 C)  Weight: 129 lb 9.6 oz (58.8 kg)   Body mass index is 22.96 kg/m. No exam data present   General: well developed, well nourished,  pleasant middle-age Caucasian male, seated, in no evident distress Head: head normocephalic and atraumatic.   Neck: supple with no carotid or supraclavicular bruits Cardiovascular:  regular rate and rhythm, no murmurs Musculoskeletal: no deformity Skin:  no rash/petichiae Vascular:  Normal pulses all extremities   Neurologic Exam Mental Status: Awake and fully alert.   Speech and language normal.  Oriented to place and time. Recent and remote memory intact. Attention  span, concentration and fund of knowledge appropriate. Mood and affect appropriate.  Cranial Nerves: Fundoscopic exam reveals sharp disc margins. Pupils equal, briskly reactive to light. Extraocular movements full without nystagmus. Visual fields subjective blurriness right periphery and left upper periphery. Hearing intact. Facial sensation intact. Face, tongue, palate moves normally and symmetrically.  Motor: Normal bulk and tone. Normal strength in all tested extremity muscles. Sensory.: intact to touch , pinprick , position and vibratory sensation.  Coordination: Rapid alternating movements normal in all extremities. Finger-to-nose and heel-to-shin performed accurately bilaterally. Gait and Station: Arises from chair without difficulty. Stance is normal. Gait demonstrates normal stride length and balance Reflexes: 1+ and symmetric. Toes downgoing.     NIHSS  1 Modified Rankin  1    Diagnostic Data (Labs, Imaging, Testing)  Ct Abdomen Pelvis Wo Contrast 09/23/2019 IMPRESSION: 1. No acute abdominal/pelvic findings, mass lesions or adenopathy.  2. No renal, ureteral or bladder calculi or mass.  3. High attenuation material in the gallbladder could be sludge or stones. No obvious CT findings for acute cholecystitis. Right upper quadrant ultrasound examination may be helpful for further evaluation.   Ct Head Wo Contrast 09/23/2019 IMPRESSION:  1. Two or 3 rounded/oval regions of low attenuation in the cerebellar hemispheres as described above, not seen on the previous study. These could represent small interval lacunar infarcts. The multiplicity raises the possibility of embolic disease. The findings  are otherwise age indeterminate. An MRI could further assess and determine acuity.  2. No other acute abnormalities are noted.    Mr Brain Wo Contrast 09/24/2019 IMPRESSION:  1. 5 mm acute infarct right parietal cortex over the convexity  2. Small area of subacute infarct right cerebellum. Chronic infarct left cerebellum  3. Chronic hemorrhagic infarct left occipital lobe.   US Renal 09/24/2019 IMPRESSION:  1. No significant renal abnormality.  2. Distended bladder.    Dg Chest Port 1 View 09/23/2019 MPRESSION:  No active disease.    TEE 06/20/2019 IMPRESSIONS 1. The cavity size was normal. There is mildly increased left ventricular wall thickness. 2. LA, LAA without masses. 3. No PFO by color doppler or as tested with injection of agitated saline. 4. The aortic valve is tricuspid. 5. The aortic root, ascending aorta, aortic arch and descending aorta are normal in size and structure.   ECG - ST rate 110  BPM. (See cardiology reading for complete details)    ASSESSMENT: Silver Wisner is a 33 y.o. year old male here with large left PCA infarct embolic pattern on [id number] secondary to large vessel disease source versus cardioembolic and then presented on 09/25/2019 with finding of multiple bilateral infarcts cryptogenic etiology. Vascular risk factors include DM1, HTN, HLD, CKD stage III, EtOH use, THC use and tobacco use.  Residual deficits of decreased vision and balance difficulties but overall improving.    PLAN:  1. Multiple bilateral cryptogenic infarcts:  Continue clopidogrel 75 mg daily for secondary stroke prevention.  Advised to discontinue aspirin and continue on Plavix alone has no indication for long-term DAPT.  Recommend initiating Crestor 20 mg daily and discontinue atorvastatin for possible statin myalgias.  Advised to continue to follow with cardiology for further evaluation of potential stroke etiology.  Maintain strict control of hypertension  with blood pressure goal below 130/90, diabetes with hemoglobin A1c goal below 6.5% and cholesterol with LDL cholesterol (bad cholesterol) goal below 70 mg/dL.  I also advised the patient to eat a healthy diet with plenty of whole grains, cereals, fruits and vegetables,  exercise regularly with at least 30 minutes of continuous activity daily and maintain ideal body weight. 2. HTN: Advised to continue current treatment regimen.  Encouraged to monitor blood pressure at home routinely as well as with any type of symptoms.  Advised him to follow-up with cardiology or PCP for ongoing management and monitoring 3. HLD: Discontinue atorvastatin initiate Crestor 20 mg daily for possible intolerance to atorvastatin.  Continue to follow with PCP for ongoing monitoring and management 4. DMI: Advised to continue to monitor glucose levels at home along with continued follow-up with PCP for management and monitoring 5. CKD stage III: Continue to follow nephrology as scheduled 6. Residual deficits: Plans having evaluated by ophthalmology for disability requirements.  Discussion regarding potential participation in outpatient therapies but due to lack of insurance at this time, he would like to hold off to see if he has additional improvement 7. Tobacco/THC use: Discussion with patient regarding importance of cessation due to increased risk of stroke and cardiac disease with ongoing use.  He verbalized understanding.   Follow up in 4 months or call earlier if needed   Greater than 50% of time during this 45 minute visit was spent on counseling, explanation of diagnosis of left PCA infarct, reviewing risk factor management of HTN, HLD, DM and CKD, discussion regarding likelihood of stroke etiology cardioembolic, planning of further management along with potential future management, and discussion with patient and family answering all questions.    Venancio Poisson, AGNP-BC  [facility] [address] [place], [place] [address]  Phone [phone] Fax [phone] Note: This document was prepared with digital dictation and possible smart phrase technology. Any transcriptional errors that result from this process are unintentional.

## 2019-11-09 NOTE — Patient Instructions (Addendum)
Continue clopidogrel 75 mg daily for secondary stroke prevention  Discontinue aspirin and atorvastatin  Start Crestor 20 mg daily for cholesterol management  Continue to follow up with PCP regarding cholesterol, blood pressure and diabetes management   Continue to follow with nephrology as scheduled  Continue to follow with cardiology as scheduled   Very important to discontinue tobacco and recreational drug use to prevent reoccurring strokes  Recommend monitoring blood pressure at home and to follow-up with your PCP or cardiologist regarding ongoing monitoring and management  Maintain strict control of hypertension with blood pressure goal below 130/90, diabetes with hemoglobin A1c goal below 6.5% and cholesterol with LDL cholesterol (bad cholesterol) goal below 70 mg/dL. I also advised the patient to eat a healthy diet with plenty of whole grains, cereals, fruits and vegetables, exercise regularly and maintain ideal body weight.  Followup in the future with me in 4 months or call earlier if needed       Thank you for coming to see [place] at [facility]. I hope we have been able to provide you high quality care today.  You may receive a patient satisfaction survey over the next few weeks. We would appreciate your feedback and comments so that we may continue to improve ourselves and the health of our patients.

## 2019-11-13 NOTE — Progress Notes (Signed)
I agree with the above plan 

## 2019-11-15 ENCOUNTER — Ambulatory Visit: Payer: Self-pay | Admitting: Cardiology

## 2019-11-17 ENCOUNTER — Other Ambulatory Visit: Payer: Self-pay

## 2019-11-17 ENCOUNTER — Encounter: Payer: Self-pay | Admitting: Cardiology

## 2019-11-17 ENCOUNTER — Ambulatory Visit (INDEPENDENT_AMBULATORY_CARE_PROVIDER_SITE_OTHER): Payer: Medicaid Other | Admitting: Cardiology

## 2019-11-17 VITALS — BP 138/98 | HR 87 | Ht 63.0 in | Wt 125.5 lb

## 2019-11-17 DIAGNOSIS — F17200 Nicotine dependence, unspecified, uncomplicated: Secondary | ICD-10-CM | POA: Diagnosis not present

## 2019-11-17 DIAGNOSIS — I10 Essential (primary) hypertension: Secondary | ICD-10-CM | POA: Diagnosis not present

## 2019-11-17 DIAGNOSIS — Z8673 Personal history of transient ischemic attack (TIA), and cerebral infarction without residual deficits: Secondary | ICD-10-CM | POA: Diagnosis not present

## 2019-11-17 DIAGNOSIS — IMO0001 Reserved for inherently not codable concepts without codable children: Secondary | ICD-10-CM

## 2019-11-17 NOTE — Progress Notes (Signed)
Cardiology Office Note:    Date:  11/17/2019   ID:  [facility], DOB July 02, 1986, MRN [id number]  PCP:  Elsie Stain, MD  Cardiologist:  Kate Sable, MD  Electrophysiologist:  None   Referring MD: Elsie Stain, MD   Chief Complaint  Patient presents with  . other    F/u zio 6 wk no complaints today. Meds reviewed verbally with pt.    History of Present Illness:    George [name] is a 33 y.o. male with a hx of hypertension, current smoker, diabetes type 1, CKD, CVA who presents for a 6-week follow-up.  He was originally seen due to a history of CVA and hypertension.    He had prior hospitalizations, dates 09/23/2019 to 09/26/2019.  He presented with altered mental status found to be in DKA.  Blood pressures were elevated up to 170s/108 and also tachycardic up to 160s which improved with verapamil.  EKG was sinus tachycardia.  Head imaging did reveal an acute infarct in the right parietal cortex and subacute infarct in the right cerebellum.  Of note, patient has a history of left PCA infarct in June 2020, after being admitted to the hospital due to slurred speech.  He was evaluated with a TEE which was negative for PFO or any cardiac source of embolus.  Seen by neurology who recommended patient be managed with dual antiplatelet with aspirin and Plavix for 3 weeks then Plavix after that.  He denies any palpitations.  Able to do all activities without chest pain or shortness of breath.  He originally was taking HCTZ for blood pressure, but that was stopped after his first hospitalization in June due to renal dysfunction.  On discharge from the hospital, his blood pressure regimen was adjusted to amlodipine 10 mg daily, Coreg 25 mg twice daily, hydralazine 10 mg 3 times daily, clonidine 0.2 mg twice daily.  Patient is a current smoker has been smoking for about 8 years now.  He is trying to quit.  A cardiac monitor was placed after last patient visit to evaluate for any atrial  fibrillation or atrial flutter.  He now presents for results.  Past Medical History:  Diagnosis Date  . AKI (acute kidney injury) ([place])   . Cerebral infarction ([place])   . Hypertension   . Neurologic deficit due to acute ischemic cerebrovascular accident (CVA) ([place]) 06/13/2019  . Supraventricular tachycardia ([place])   . Type 1 diabetes [facility])     Past Surgical History:  Procedure Laterality Date  . BUBBLE STUDY  06/20/2019   Procedure: BUBBLE STUDY;  Surgeon: Fay Records, MD;  Location: [facility];  Service: Cardiovascular;;  . TEE WITHOUT CARDIOVERSION N/A 06/20/2019   Procedure: TRANSESOPHAGEAL ECHOCARDIOGRAM (TEE);  Surgeon: Fay Records, MD;  Location: [facility] ENDOSCOPY;  Service: Cardiovascular;  Laterality: N/A;    Current Medications: Current Meds  Medication Sig  . acetaminophen (TYLENOL) 325 MG tablet Take 650 mg by mouth every 6 (six) hours as needed for headache.  Marland Kitchen amLODipine (NORVASC) 10 MG tablet Take 1 tablet (10 mg total) by mouth daily.  . carvedilol (COREG) 25 MG tablet Take 1 tablet (25 mg total) by mouth 2 (two) times daily with a meal.  . cloNIDine (CATAPRES) 0.2 MG tablet Take 1 tablet (0.2 mg total) by mouth 2 (two) times daily.  . clopidogrel (PLAVIX) 75 MG tablet Take 1 tablet (75 mg total) by mouth daily.  . ferrous sulfate 325 (65 FE) MG tablet Take 1 tablet (325 mg  total) by mouth daily with breakfast.  . hydrALAZINE (APRESOLINE) 10 MG tablet Take 1 tablet (10 mg total) by mouth every 8 (eight) hours.  . insulin NPH Human (NOVOLIN N) 100 UNIT/ML injection Inject 0.08 mLs (8 Units total) into the skin 2 (two) times daily with a meal. (Patient taking differently: Inject 8 Units into the skin 2 (two) times daily with a meal. Take 8 in the am and 12 at night)  . insulin regular (NOVOLIN R) 100 units/mL injection Inject 0.04 mLs (4 Units total) into the skin 3 (three) times daily before meals. May increase by 1 unit at each dose for glucose greater than 180 (Patient  taking differently: Inject 4 Units into the skin See admin instructions. Sliding Scale: 4 units three times daily before meals, then increase by 1 unit at each dose for glucose greater than 180.)  . pantoprazole (PROTONIX) 40 MG tablet Take 1 tablet (40 mg total) by mouth daily. For stomach protection  . rosuvastatin (CRESTOR) 20 MG tablet Take 1 tablet (20 mg total) by mouth daily.     Allergies:   Patient has no known allergies.   Social History   Socioeconomic History  . Marital status: Single    Spouse name: Not on file  . Number of children: Not on file  . Years of education: Not on file  . Highest education level: Not on file  Occupational History  . Not on file  Social Needs  . Financial resource strain: Not on file  . Food insecurity    Worry: Not on file    Inability: Not on file  . Transportation needs    Medical: Not on file    Non-medical: Not on file  Tobacco Use  . Smoking status: Current Every Day Smoker    Packs/day: 0.25    Types: Cigarettes    Start date: 12/29/2010  . Smokeless tobacco: Never Used  Substance and Sexual Activity  . Alcohol use: Not Currently    Comment: 6 beers/week  . Drug use: Not Currently  . Sexual activity: Not on file  Lifestyle  . Physical activity    Days per week: Not on file    Minutes per session: Not on file  . Stress: Not on file  Relationships  . Social Herbalist on phone: Not on file    Gets together: Not on file    Attends religious service: Not on file    Active member of club or organization: Not on file    Attends meetings of clubs or organizations: Not on file    Relationship status: Not on file  Other Topics Concern  . Not on file  Social History Narrative  . Not on file     Family History: The patient's family history includes Diabetes in his father and mother; Hyperlipidemia in his mother; Hypertension in his mother.  ROS:   Please see the history of present illness.     All other systems  reviewed and are negative.  EKGs/Labs/Other Studies Reviewed:    The following studies were reviewed today:  2 Week cardiac monitor date 10/31/2019 Patient had a min HR of 55 bpm, max HR of 146 bpm, and avg HR of 79 bpm. Predominant underlying rhythm was Sinus Rhythm. Isolated SVEs were rare (<1.0%), SVE Couplets were rare (<1.0%), and no SVE Triplets were present. Isolated VEs were rare (<1.0%), and no VE Couplets or VE Triplets were present.   EKG:  EKG is  ordered today.  The ekg ordered today demonstrates normal sinus rhythm, right axis deviation, otherwise normal ECG.  Recent Labs: 09/23/2019: TSH 1.268 09/26/2019: ALT 18; BUN 32; Creatinine, Ser 3.02; Hemoglobin 11.7; Magnesium 2.0; Platelets 169; Potassium 4.0; Sodium 139  Recent Lipid Panel    Component Value Date/Time   CHOL 156 06/27/2019 1033   TRIG 46 06/27/2019 1033   HDL 76 06/27/2019 1033   CHOLHDL 2.1 06/27/2019 1033   CHOLHDL 4.1 06/13/2019 0251   VLDL 60 (H) 06/13/2019 0251   LDLCALC 71 06/27/2019 1033    Physical Exam:    VS:  BP (!) 138/98 (BP Location: Left Arm, Patient Position: Sitting, Cuff Size: Normal)   Pulse 87   Ht 5\' 3"  (1.6 m)   Wt 125 lb 8 oz (56.9 kg)   SpO2 99%   BMI 22.23 kg/m     Wt Readings from Last 3 Encounters:  11/17/19 125 lb 8 oz (56.9 kg)  11/09/19 129 lb 9.6 oz (58.8 kg)  09/30/19 126 lb (57.2 kg)     GEN:  Well nourished, well developed in no acute distress HEENT: Normal NECK: No JVD; No carotid bruits LYMPHATICS: No lymphadenopathy CARDIAC: RRR, no murmurs, rubs, gallops RESPIRATORY:  Clear to auscultation without rales, wheezing or rhonchi  ABDOMEN: Soft, non-tender, non-distended MUSCULOSKELETAL:  No edema; No deformity  SKIN: Warm and dry NEUROLOGIC:  Alert and oriented x 3 PSYCHIATRIC:  Normal affect   ASSESSMENT:   Reasons for stroke school include smoking history and hypertension.  He denies palpitations.  Holter monitor did not show any evidence of A. fib  or flutter.  No other significant or deep arrhythmias noted.  1. History of CVA (cerebrovascular accident)   2. Essential hypertension   3. Smoking    PLAN:    In order of problems listed above:  1. History of CVA, no cardioembolic source found.  He denies palpitations.  2-week cardiac monitor did not show any episodes of A. fib or flutter.  Patient was made aware of results. 2. Blood pressure well controlled continue current blood pressure management. 3. The patient was counseled on tobacco cessation today for 10 minutes.  Counseling included reviewing the risks of smoking tobacco products, how it impacts the patient's current medical diagnoses and different strategies for quitting.  Pharmacotherapy to aid in tobacco cessation was not prescribed today.  Follow-up as needed.  Medication Adjustments/Labs and Tests Ordered: Current medicines are reviewed at length with the patient today.  Concerns regarding medicines are outlined above.  Orders Placed This Encounter  Procedures  . EKG 12-Lead   No orders of the defined types were placed in this encounter.   Patient Instructions  Medication Instructions:  None *If you need a refill on your cardiac medications before your next appointment, please call your pharmacy*  Lab Work: None If you have labs (blood work) drawn today and your tests are completely normal, you will receive your results only by: Marland Kitchen MyChart Message (if you have MyChart) OR . A paper copy in the mail If you have any lab test that is abnormal or we need to change your treatment, we will call you to review the results.  Testing/Procedures: None  Follow-Up: At [facility], you and your health needs are our priority.  As part of our continuing mission to provide you with exceptional heart care, we have created designated Provider Care Teams.  These Care Teams include your primary Cardiologist (physician) and Advanced Practice Providers (APPs -  Physician  Assistants and Nurse Practitioners) who all work together to provide you with the care you need, when you need it.  Your next appointment:   As needed      Signed, Kate Sable, MD  11/17/2019 11:49 AM    [place]

## 2019-11-17 NOTE — Patient Instructions (Signed)
Medication Instructions:  None *If you need a refill on your cardiac medications before your next appointment, please call your pharmacy*  Lab Work: None If you have labs (blood work) drawn today and your tests are completely normal, you will receive your results only by: . MyChart Message (if you have MyChart) OR . A paper copy in the mail If you have any lab test that is abnormal or we need to change your treatment, we will call you to review the results.  Testing/Procedures: None  Follow-Up: At [facility], you and your health needs are our priority.  As part of our continuing mission to provide you with exceptional heart care, we have created designated Provider Care Teams.  These Care Teams include your primary Cardiologist (physician) and Advanced Practice Providers (APPs -  Physician Assistants and Nurse Practitioners) who all work together to provide you with the care you need, when you need it.  Your next appointment:   As needed  

## 2019-12-05 ENCOUNTER — Telehealth: Payer: Self-pay | Admitting: Adult Health

## 2019-12-05 MED ORDER — ROSUVASTATIN CALCIUM 20 MG PO TABS: 20.0000 mg | ORAL_TABLET | Freq: Every day | ORAL | 0 refills | Status: DC

## 2019-12-05 NOTE — Telephone Encounter (Signed)
1) Medication(s) Requested (by name): rosuvastatin (CRESTOR) 20 MG tablet   2) Pharmacy of Choice: [id number] - [place], [place]

## 2019-12-18 NOTE — Progress Notes (Signed)
Subjective:    Patient ID: George [name], male    DOB: 10-05-1986, 33 y.o.   MRN: [id number] Virtual Visit via Telephone Note  I connected with [facility] on 12/19/19 at 10:30 AM EST by telephone and verified that I am speaking with the correct person using two identifiers.   Consent:  I discussed the limitations, risks, security and privacy concerns of performing an evaluation and management service by telephone and the availability of in person appointments. I also discussed with the patient that there may be a patient responsible charge related to this service. The patient expressed understanding and agreed to proceed.  Location of patient: The patient was at home  Location of provider: I was with in my office  Persons participating in the televisit with the patient.   Patient's mother was on the call as well    History of Present Illness: This is a 33 year old male has had previous history of cerebral infarction, type 1 diabetes, hypertension, tobacco use, supraventricular tachycardia, chronic kidney disease.  Since the last visit the patient has improved somewhat.  Labs did come back showing normal thyroid function and cholesterol at goal.  The patient's blood sugars have been reasonably well-controlled running fasting levels in the 1 20-1 60 range.  Note today the CBG is 180 blood pressure has come down but still not yet at goal today 160/112  The patient has yet to see speech pathology for his aphasia.  The barrier there was the patient did not have insurance and I indicated to the scheduler the patient can be self-pay  The patient has been able to reduce tobacco use down to 3 to 5 cigarettes daily and has yet to pick up the nicotine lozenges   09/19/2019 This is a follow-up visit for this 33 year old male with type 1 diabetes, hypertension, tobacco use, chronic kidney disease, and previous history of cerebral infarction.  The patient states apart from some fatigue he  is doing fairly well.  He is now smoking 2 packs of cigarettes a week.  His blood pressure at home ranges been 130-135/80-85 range.  His blood sugars have been in the 1 20-1 50s with average is 1 30-1 40 on his meter.  His sugar will go down as low as 60s before dinner.  The patient does have history of receptive a aphasia and notes when he is playing his video games he will remember some names but not all the names of the characters in the game.  He has difficulty with memory retrieval.  He saw speech pathology 1 time and has not yet made a follow-up visit.    Patient did see nephrology and has a follow-up visit pending.  At the last visit the patient not receive any labs.  The patient denies any chest pain or shortness of breath.  12/21: Since the last visit the patient was hospitalized 4 days after our visit in September.  The patient had diabetic ketoacidosis and also new areas of stroke.  Below is the discharge summary from that hospitalization  [name] a 33 y.o.malewith medical history significant oftype 1 diabetes, hypertension, recent CVA in June with combined receptive and expressive aphasia, CKD stage III who presented here with altered mental status and reportedly has been having nausea and persistent vomiting since Wednesday. Reportedly from EDP patient was initially able to give some history but acutely became more agitated and was pulling out his IV. He was tachycardic up to 160s and had improvement of heart rate  down to 120s with 10 mg of verapamil. He was also tachypneic but remained on room air. Patient was restless and agitated at the time my evaluation and per nursing report he had attempted to use his foot to remove his hand restraints. He repeatedly stated "please help me," but was unable to explain any of his symptoms to me. He was pan positive and replied yes to every review of systems questions. Also stating he has pain everywhere. When asked for his full name  and where he was he only stated "[place]."  He was afebrile and hypertensive up to 170s/108. In sinus tachycardia up to 160 and mildly tachypneic on room air. CBC showed leukocytosis of 29.6 and no anemia. CMP showed glucose level 532 with bicarb of 18 and anion gap of 24. pH was mildly alkalotic at 7.59. Lactic acid of 5 and then 3.2 following IV fluids. TSH normal. COVID testing negative. CT head shows 2 or 3 rounded oval regions of low attenuation in the cerebral hemisphere not seen on previous studies. Suggest could be small interval lacunar infarcts with the possibility of embolic disease. Findings were age-indeterminate. CT abdomen showed no acute findings. There is gallbladder sludge or stones but no findings of acute cholecystitis.  He was seen for nausea, vomiting, and altered mental status.  He was found to be in DKA.  He improved with insulin gtt and was transitioned to his home regimen.  He had head imaging and was found to have new strokes.  He was seen by neurology who recommended DAPT x 21 days then plavix alone.  Hospitalization also c/b AKI on CKD improving at time of discharge.  Hospital Course:  Diabetic ketoacidosis  T1DM Hypoglycemia - Unclear precipitating cause.  - He's had borderline temps, tmax of 100.2 since admission, but clinically appears to be improving and UA and CXR not suggestive of infection - follow blood cx (gram positive rods in 1/2 blood cx - repeat cx from 9/27 ngtd)  and follow urine cx (urine did have WBC, but negative LE/nitrites - no growth) - hold off on abx for now - DKA now resolved, AG 13, bicarb 22, BG173 - resumehome insulin at reduced dose 8units NPH with 3 units at meals and SSI(he's had some hypoglycemia here and he notes some at home as well - discussed importance of f/u with Dr. Joya Gaskins and monitoring BG at home) - Diabetic coordinator - A1c is 7.3 - check HbA1C  Altered mental status/delirium -delirium precautions - utox  positive for MJ -resolved  Acute Infarct R Parietal Cortex  Subacute Infarct R Cerebellum  Hx CVA: -Hx L PCA infarct in June - Was on ASA/plavix and supposed to stop plavix and continue ASA alone after 9/19 (admitting provider started on plavix- have added aspirin, will deferfinal planto neuro) - Thought 2/2 cardioembolic source -> they'd planned for 30 day event monitor as echo and TEE were negative for PFO or idenifiable cardiac source of embolus and hypercoagulable/vasculitis w/u was negative per recent neuro note - will reconsult neurology in setting of acute stroke, appreciate recs- recurrent cryptogenic strokes - recommend ASA and plavix x3 weeks then plavix alone.  Of note, discharged with 81 mg ASA with plavix, but on review of neuro note - intended to be 325 mg aspirin with plavix x21 days then plavix alone.  Called mother to correct this after discharge - she expressed understanding.  Hypertension  Tachycardia Significantly elevated BP's this AM His clonidine and coreg were being held -> which  likely contributed to rebound hypertension and tachycardia Resume clonidine and coreg and amlodipine - BP still significantly elevated - will add hydralazine at d/c to help gradually normalize BP after stroke - needs close follow up with Dr. Joya Gaskins EKG with sinus tachycardia  Acute kidney injury -Creatinine of 4.11 on admission - improved to 3.02 today - good UOP - baseline creatinine ~2.2-2.4 - renal US without hydro, distended bladder noted(per nursing 9/26, bladder scan showed around 500 cc, then he voided as much) - creatinine improving - follow with nephrology as outpatient -Continue to monitor serial BMP -Avoidnephrotoxic agent  Leukocytosis: improving, follow, follow cx- given sludge vs stone in gallbladder on imaging, will follow RUQ [place] as he did have N/V on presentation - RUQ wnl  Gram Positive Rods in 1/2 Cx: suspect contaminant, improving off abx - follow repeat  cx (NGTD)  In their interim the patient's had no additional episodes of fever or DKA.  His glycemic control has been such that he occasionally runs in the 70 range early in the morning but will rebound over 200 late in the day.  It is been difficult to control with the use of long-acting NPH twice daily 8 units in the morning 12 units in the evening at meals and then associate that with short acting Humalog  The patient has since followed up with cardiology with an event monitor showing no evidence of arrhythmia therefore no evidence for embolic event.  Neurology has seen the patient and recommended switch from atorvastatin to Crestor discontinuance of a full dose aspirin after 21 days and continuing Plavix for life  Blood pressure control continue with amlodipine clonidine and Coreg The patient also has follow-up with [place] [facility] scheduled creatinine is in the 3 range Also an ophthalmology visit was obtained and there is apparently blood in the eyes with diabetic retinopathy  The patient does need a retinal eye referral   Past Medical History:  Diagnosis Date  . AKI (acute kidney injury) ([place])   . Cerebral infarction ([place])   . Hypertension   . Neurologic deficit due to acute ischemic cerebrovascular accident (CVA) ([place]) 06/13/2019  . Supraventricular tachycardia ([place])   . Type 1 diabetes ([facility])      Family History  Problem Relation Age of Onset  . Hypertension Mother   . Diabetes Mother   . Hyperlipidemia Mother   . Diabetes Father      Social History   Socioeconomic History  . Marital status: Single    Spouse name: Not on file  . Number of children: Not on file  . Years of education: Not on file  . Highest education level: Not on file  Occupational History  . Not on file  Tobacco Use  . Smoking status: Current Every Day Smoker    Packs/day: 0.25    Types: Cigarettes    Start date: 12/29/2010  . Smokeless tobacco: Never Used  Substance and Sexual Activity  . Alcohol  use: Not Currently    Comment: 6 beers/week  . Drug use: Not Currently  . Sexual activity: Not on file  Other Topics Concern  . Not on file  Social History Narrative  . Not on file   Social Determinants of Health   Financial Resource Strain:   . Difficulty of Paying Living Expenses: Not on file  Food Insecurity:   . Worried About Charity fundraiser in the Last Year: Not on file  . Ran Out of Food in the Last Year: Not on  file  Transportation Needs:   . Film/video editor (Medical): Not on file  . Lack of Transportation (Non-Medical): Not on file  Physical Activity:   . Days of Exercise per Week: Not on file  . Minutes of Exercise per Session: Not on file  Stress:   . Feeling of Stress : Not on file  Social Connections:   . Frequency of Communication with Friends and Family: Not on file  . Frequency of Social Gatherings with Friends and Family: Not on file  . Attends Religious Services: Not on file  . Active Member of Clubs or Organizations: Not on file  . Attends Archivist Meetings: Not on file  . Marital Status: Not on file  Intimate Partner Violence:   . Fear of Current or Ex-Partner: Not on file  . Emotionally Abused: Not on file  . Physically Abused: Not on file  . Sexually Abused: Not on file     No Known Allergies   Outpatient Medications Prior to Visit  Medication Sig Dispense Refill  . acetaminophen (TYLENOL) 325 MG tablet Take 650 mg by mouth every 6 (six) hours as needed for headache.    Marland Kitchen amLODipine (NORVASC) 10 MG tablet Take 1 tablet (10 mg total) by mouth daily. 30 tablet 5  . cloNIDine (CATAPRES) 0.2 MG tablet Take 1 tablet (0.2 mg total) by mouth 2 (two) times daily. 60 tablet 5  . ferrous sulfate 325 (65 FE) MG tablet Take 1 tablet (325 mg total) by mouth daily with breakfast. 30 tablet 6  . pantoprazole (PROTONIX) 40 MG tablet Take 1 tablet (40 mg total) by mouth daily. For stomach protection 30 tablet 11  . carvedilol (COREG) 25 MG  tablet Take 1 tablet (25 mg total) by mouth 2 (two) times daily with a meal. 180 tablet 1  . clopidogrel (PLAVIX) 75 MG tablet Take 1 tablet (75 mg total) by mouth daily. 30 tablet 2  . hydrALAZINE (APRESOLINE) 10 MG tablet Take 1 tablet (10 mg total) by mouth every 8 (eight) hours. 90 tablet 2  . insulin NPH Human (NOVOLIN N) 100 UNIT/ML injection Inject 0.08 mLs (8 Units total) into the skin 2 (two) times daily with a meal. (Patient taking differently: Inject 8 Units into the skin 2 (two) times daily with a meal. Take 8 in the am and 12 at night) 10 mL 11  . insulin regular (NOVOLIN R) 100 units/mL injection Inject 0.04 mLs (4 Units total) into the skin 3 (three) times daily before meals. May increase by 1 unit at each dose for glucose greater than 180 (Patient taking differently: Inject 4 Units into the skin See admin instructions. Sliding Scale: 4 units three times daily before meals, then increase by 1 unit at each dose for glucose greater than 180.) 10 mL 6  . rosuvastatin (CRESTOR) 20 MG tablet Take 1 tablet (20 mg total) by mouth daily. 30 tablet 0   No facility-administered medications prior to visit.    Review of Systems  Constitutional: Negative for activity change, appetite change, chills, diaphoresis, fatigue, fever and unexpected weight change.  HENT: Negative.   Eyes: Positive for visual disturbance.  Respiratory: Negative.   Cardiovascular: Negative.  Negative for palpitations.  Gastrointestinal: Positive for constipation.  Endocrine: Negative.   Genitourinary: Negative.   Musculoskeletal: Negative.   Neurological: Positive for speech difficulty. Negative for dizziness, tremors, seizures, syncope, facial asymmetry, light-headedness, numbness and headaches.  Hematological: Negative for adenopathy. Does not bruise/bleed easily.  Psychiatric/Behavioral: Negative.  Objective:   Physical Exam There were no vitals filed for this visit. No physical exam as this was a  telephone visit BMP Latest Ref Rng & Units 09/26/2019 09/25/2019 09/24/2019  Glucose 70 - 99 mg/dL 215(H) 45(L) 173(H)  BUN 6 - 20 mg/dL 32(H) 34(H) 47(H)  Creatinine 0.61 - 1.24 mg/dL 3.02(H) 3.07(H) 3.46(H)  BUN/Creat Ratio 9 - 20 - - -  Sodium 135 - 145 mmol/L 139 140 138  Potassium 3.5 - 5.1 mmol/L 4.0 3.2(L) 3.8  Chloride 98 - 111 mmol/L 104 105 103  CO2 22 - 32 mmol/L 26 25 22   Calcium 8.9 - 10.3 mg/dL 8.3(L) 8.5(L) 8.6(L)   CBC Latest Ref Rng & Units 09/26/2019 09/25/2019 09/24/2019  WBC 4.0 - 10.5 K/uL 8.6 14.3(H) 22.2(H)  Hemoglobin 13.0 - 17.0 g/dL 11.7(L) 12.9(L) 13.4  Hematocrit 39.0 - 52.0 % 33.6(L) 36.5(L) 38.2(L)  Platelets 150 - 400 K/uL 169 223 246       Assessment & Plan:  I personally reviewed all images and lab data in the [facility] system as well as any outside material available during this office visit and agree with the  radiology impressions.   HTN (hypertension) Hypertension under improved control per the patient  No change in blood pressure medicines at this time  Diabetes mellitus type I ([place]) Type 1 diabetes with recurrent DKA  I think we are at a point where an insulin pump should be giving consideration the patient does have Medicaid  Will refer to endocrinology for second opinion  In the interim we will continue insulin at current doses  Hyperlipidemia due to type 1 diabetes mellitus ([place]) We are continuing the Crestor at this time 20 mg daily  Proliferative diabetic retinopathy associated with type 1 diabetes mellitus ([place]) Proliferative retinopathy with type 1 diabetes  Possible blood in the retinal area with change in vision  Urgent referral to retinal physician made within our network  CKD (chronic kidney disease), stage III ([place]) Patient is encouraged to continue follow-up with nephrology  History of CVA (cerebrovascular accident) History of stroke with recurrent lacunar infarcts with recent admission no evidence of embolic disease  Need  to aggressively treat increase lipids to get to goal and to get A1c below 7  Tobacco use disorder The patient is down to 3 cigarettes daily we advised further smoking cessation and gave counseling to this regard   Cassandra was seen today for diabetes and hypertension.  Diagnoses and all orders for this visit:  Type 1 diabetes mellitus with stage 3 chronic kidney disease, unspecified whether stage 3a or 3b CKD ([place]) -     Ambulatory referral to Endocrinology  Proliferative diabetic retinopathy associated with type 1 diabetes mellitus, unspecified laterality, unspecified proliferative retinopathy type ([place]) -     Ambulatory referral to Ophthalmology  Essential hypertension  Stage 3b chronic kidney disease  Tobacco use disorder  History of CVA (cerebrovascular accident)  Hyperlipidemia due to type 1 diabetes mellitus ([place])  Other orders -     carvedilol (COREG) 25 MG tablet; Take 1 tablet (25 mg total) by mouth 2 (two) times daily with a meal. -     clopidogrel (PLAVIX) 75 MG tablet; Take 1 tablet (75 mg total) by mouth daily. -     insulin NPH Human (NOVOLIN N) 100 UNIT/ML injection; Inject 0.08 mLs (8 Units total) into the skin 2 (two) times daily with a meal. Take 8 in the am and 12 at night -     insulin  regular (NOVOLIN R) 100 units/mL injection; Inject 0.04 mLs (4 Units total) into the skin See admin instructions. Sliding Scale: 4 units three times daily before meals, then increase by 1 unit at each dose for glucose greater than 180. -     rosuvastatin (CRESTOR) 20 MG tablet; Take 1 tablet (20 mg total) by mouth daily. -     hydrALAZINE (APRESOLINE) 10 MG tablet; Take 1 tablet (10 mg total) by mouth every 8 (eight) hours.       Follow Up Instructions: Return appointment will occur in January face-to-face the patient also knows an appointment with endocrinology will be obtained along with ophthalmology with retinal specialist   I discussed the assessment and treatment plan  with the patient. The patient was provided an opportunity to ask questions and all were answered. The patient agreed with the plan and demonstrated an understanding of the instructions.   The patient was advised to call back or seek an in-person evaluation if the symptoms worsen or if the condition fails to improve as anticipated.  I provided 30  minutes of non-face-to-face time during this encounter  including  median intraservice time , review of notes, labs, imaging, medications  and explaining diagnosis and management to the patient .    Asencion Noble, MD

## 2019-12-19 ENCOUNTER — Encounter: Payer: Self-pay | Admitting: Critical Care Medicine

## 2019-12-19 ENCOUNTER — Other Ambulatory Visit: Payer: Self-pay

## 2019-12-19 ENCOUNTER — Ambulatory Visit: Payer: Medicaid Other | Attending: Critical Care Medicine | Admitting: Critical Care Medicine

## 2019-12-19 DIAGNOSIS — E1022 Type 1 diabetes mellitus with diabetic chronic kidney disease: Secondary | ICD-10-CM

## 2019-12-19 DIAGNOSIS — I10 Essential (primary) hypertension: Secondary | ICD-10-CM

## 2019-12-19 DIAGNOSIS — E1069 Type 1 diabetes mellitus with other specified complication: Secondary | ICD-10-CM | POA: Diagnosis not present

## 2019-12-19 DIAGNOSIS — F17200 Nicotine dependence, unspecified, uncomplicated: Secondary | ICD-10-CM

## 2019-12-19 DIAGNOSIS — E785 Hyperlipidemia, unspecified: Secondary | ICD-10-CM

## 2019-12-19 DIAGNOSIS — Z720 Tobacco use: Secondary | ICD-10-CM

## 2019-12-19 DIAGNOSIS — Z8673 Personal history of transient ischemic attack (TIA), and cerebral infarction without residual deficits: Secondary | ICD-10-CM

## 2019-12-19 DIAGNOSIS — N183 Chronic kidney disease, stage 3 unspecified: Secondary | ICD-10-CM | POA: Diagnosis not present

## 2019-12-19 DIAGNOSIS — I129 Hypertensive chronic kidney disease with stage 1 through stage 4 chronic kidney disease, or unspecified chronic kidney disease: Secondary | ICD-10-CM

## 2019-12-19 DIAGNOSIS — Z794 Long term (current) use of insulin: Secondary | ICD-10-CM

## 2019-12-19 DIAGNOSIS — Z79899 Other long term (current) drug therapy: Secondary | ICD-10-CM

## 2019-12-19 DIAGNOSIS — E103599 Type 1 diabetes mellitus with proliferative diabetic retinopathy without macular edema, unspecified eye: Secondary | ICD-10-CM

## 2019-12-19 DIAGNOSIS — N1832 Chronic kidney disease, stage 3b: Secondary | ICD-10-CM

## 2019-12-19 MED ORDER — INSULIN NPH (HUMAN) (ISOPHANE) 100 UNIT/ML ~~LOC~~ SUSP: 8.0000 [IU] | Freq: Two times a day (BID) | SUBCUTANEOUS | 3 refills | Status: DC

## 2019-12-19 MED ORDER — HYDRALAZINE HCL 10 MG PO TABS: 10.0000 mg | ORAL_TABLET | Freq: Three times a day (TID) | ORAL | 2 refills | Status: DC

## 2019-12-19 MED ORDER — ROSUVASTATIN CALCIUM 20 MG PO TABS: 20.0000 mg | ORAL_TABLET | Freq: Every day | ORAL | 4 refills | Status: DC

## 2019-12-19 MED ORDER — CLOPIDOGREL BISULFATE 75 MG PO TABS: 75.0000 mg | ORAL_TABLET | Freq: Every day | ORAL | 2 refills | Status: AC

## 2019-12-19 MED ORDER — CARVEDILOL 25 MG PO TABS: 25.0000 mg | ORAL_TABLET | Freq: Two times a day (BID) | ORAL | 1 refills | Status: DC

## 2019-12-19 MED ORDER — INSULIN REGULAR HUMAN 100 UNIT/ML IJ SOLN: 4.0000 [IU] | INTRAMUSCULAR | 3 refills | Status: DC

## 2019-12-19 NOTE — Assessment & Plan Note (Signed)
History of stroke with recurrent lacunar infarcts with recent admission no evidence of embolic disease  Need to aggressively treat increase lipids to get to goal and to get A1c below 7

## 2019-12-19 NOTE — Assessment & Plan Note (Signed)
Type 1 diabetes with recurrent DKA  I think we are at a point where an insulin pump should be giving consideration the patient does have Medicaid  Will refer to endocrinology for second opinion  In the interim we will continue insulin at current doses

## 2019-12-19 NOTE — Assessment & Plan Note (Signed)
The patient is down to 3 cigarettes daily we advised further smoking cessation and gave counseling to this regard

## 2019-12-19 NOTE — Assessment & Plan Note (Signed)
Hypertension under improved control per the patient  No change in blood pressure medicines at this time

## 2019-12-19 NOTE — Assessment & Plan Note (Signed)
We are continuing the Crestor at this time 20 mg daily

## 2019-12-19 NOTE — Assessment & Plan Note (Signed)
Proliferative retinopathy with type 1 diabetes  Possible blood in the retinal area with change in vision  Urgent referral to retinal physician made within our network

## 2019-12-19 NOTE — Progress Notes (Signed)
Pt states his blood sugar was 131 this morning

## 2019-12-19 NOTE — Assessment & Plan Note (Signed)
Patient is encouraged to continue follow-up with nephrology

## 2019-12-21 ENCOUNTER — Telehealth: Payer: Self-pay | Admitting: Critical Care Medicine

## 2019-12-21 MED ORDER — ONETOUCH VERIO VI STRP: ORAL_STRIP | 11 refills | Status: DC

## 2019-12-21 NOTE — Telephone Encounter (Signed)
1) Medication(s) Requested (by name):One touch test strips verio   2) Pharmacy of Choice: [facility] [address]   3) Special Requests:   Approved medications will be sent to the pharmacy, we will reach out if there is an issue.  Requests made after 3pm may not be addressed until the following business day!  If a patient is unsure of the name of the medication(s) please note and ask patient to call back when they are able to provide all info, do not send to responsible party until all information is available!

## 2019-12-21 NOTE — Telephone Encounter (Signed)
Refill sent.

## 2019-12-26 DIAGNOSIS — Z0271 Encounter for disability determination: Secondary | ICD-10-CM

## 2020-01-05 DIAGNOSIS — N183 Chronic kidney disease, stage 3 unspecified: Secondary | ICD-10-CM | POA: Diagnosis not present

## 2020-01-05 DIAGNOSIS — N189 Chronic kidney disease, unspecified: Secondary | ICD-10-CM | POA: Diagnosis not present

## 2020-01-11 ENCOUNTER — Other Ambulatory Visit: Payer: Self-pay

## 2020-01-11 ENCOUNTER — Observation Stay (HOSPITAL_COMMUNITY): Payer: Medicaid Other

## 2020-01-11 ENCOUNTER — Inpatient Hospital Stay (HOSPITAL_COMMUNITY)
Admission: EM | Admit: 2020-01-11 | Discharge: 2020-01-13 | DRG: 064 | Disposition: A | Discharge status: Home / Self Care | Payer: Medicaid Other | Attending: Family Medicine | Admitting: Family Medicine

## 2020-01-11 ENCOUNTER — Encounter (HOSPITAL_COMMUNITY): Payer: Self-pay | Admitting: Emergency Medicine

## 2020-01-11 DIAGNOSIS — K226 Gastro-esophageal laceration-hemorrhage syndrome: Secondary | ICD-10-CM | POA: Diagnosis present

## 2020-01-11 DIAGNOSIS — R29702 NIHSS score 2: Secondary | ICD-10-CM | POA: Diagnosis present

## 2020-01-11 DIAGNOSIS — N179 Acute kidney failure, unspecified: Secondary | ICD-10-CM | POA: Diagnosis not present

## 2020-01-11 DIAGNOSIS — D631 Anemia in chronic kidney disease: Secondary | ICD-10-CM | POA: Diagnosis present

## 2020-01-11 DIAGNOSIS — K219 Gastro-esophageal reflux disease without esophagitis: Secondary | ICD-10-CM | POA: Diagnosis present

## 2020-01-11 DIAGNOSIS — I6340 Cerebral infarction due to embolism of unspecified cerebral artery: Secondary | ICD-10-CM

## 2020-01-11 DIAGNOSIS — D649 Anemia, unspecified: Secondary | ICD-10-CM | POA: Diagnosis not present

## 2020-01-11 DIAGNOSIS — I10 Essential (primary) hypertension: Secondary | ICD-10-CM | POA: Diagnosis not present

## 2020-01-11 DIAGNOSIS — E785 Hyperlipidemia, unspecified: Secondary | ICD-10-CM | POA: Diagnosis present

## 2020-01-11 DIAGNOSIS — R066 Hiccough: Secondary | ICD-10-CM | POA: Diagnosis present

## 2020-01-11 DIAGNOSIS — E1010 Type 1 diabetes mellitus with ketoacidosis without coma: Secondary | ICD-10-CM

## 2020-01-11 DIAGNOSIS — Z794 Long term (current) use of insulin: Secondary | ICD-10-CM

## 2020-01-11 DIAGNOSIS — Z833 Family history of diabetes mellitus: Secondary | ICD-10-CM

## 2020-01-11 DIAGNOSIS — I6389 Other cerebral infarction: Principal | ICD-10-CM | POA: Diagnosis present

## 2020-01-11 DIAGNOSIS — I639 Cerebral infarction, unspecified: Secondary | ICD-10-CM

## 2020-01-11 DIAGNOSIS — Z79899 Other long term (current) drug therapy: Secondary | ICD-10-CM

## 2020-01-11 DIAGNOSIS — E10649 Type 1 diabetes mellitus with hypoglycemia without coma: Secondary | ICD-10-CM | POA: Diagnosis not present

## 2020-01-11 DIAGNOSIS — Z8249 Family history of ischemic heart disease and other diseases of the circulatory system: Secondary | ICD-10-CM | POA: Diagnosis not present

## 2020-01-11 DIAGNOSIS — R1111 Vomiting without nausea: Secondary | ICD-10-CM | POA: Diagnosis not present

## 2020-01-11 DIAGNOSIS — K920 Hematemesis: Secondary | ICD-10-CM | POA: Diagnosis not present

## 2020-01-11 DIAGNOSIS — E782 Mixed hyperlipidemia: Secondary | ICD-10-CM | POA: Diagnosis not present

## 2020-01-11 DIAGNOSIS — I159 Secondary hypertension, unspecified: Secondary | ICD-10-CM

## 2020-01-11 DIAGNOSIS — D72823 Leukemoid reaction: Secondary | ICD-10-CM | POA: Diagnosis present

## 2020-01-11 DIAGNOSIS — Z20822 Contact with and (suspected) exposure to covid-19: Secondary | ICD-10-CM | POA: Diagnosis present

## 2020-01-11 DIAGNOSIS — E1165 Type 2 diabetes mellitus with hyperglycemia: Secondary | ICD-10-CM | POA: Diagnosis not present

## 2020-01-11 DIAGNOSIS — G9341 Metabolic encephalopathy: Secondary | ICD-10-CM | POA: Diagnosis present

## 2020-01-11 DIAGNOSIS — E1065 Type 1 diabetes mellitus with hyperglycemia: Secondary | ICD-10-CM

## 2020-01-11 DIAGNOSIS — F17210 Nicotine dependence, cigarettes, uncomplicated: Secondary | ICD-10-CM | POA: Diagnosis present

## 2020-01-11 DIAGNOSIS — I340 Nonrheumatic mitral (valve) insufficiency: Secondary | ICD-10-CM | POA: Diagnosis not present

## 2020-01-11 DIAGNOSIS — E1022 Type 1 diabetes mellitus with diabetic chronic kidney disease: Secondary | ICD-10-CM | POA: Diagnosis not present

## 2020-01-11 DIAGNOSIS — R110 Nausea: Secondary | ICD-10-CM | POA: Diagnosis not present

## 2020-01-11 DIAGNOSIS — N184 Chronic kidney disease, stage 4 (severe): Secondary | ICD-10-CM

## 2020-01-11 DIAGNOSIS — Z8673 Personal history of transient ischemic attack (TIA), and cerebral infarction without residual deficits: Secondary | ICD-10-CM | POA: Diagnosis not present

## 2020-01-11 DIAGNOSIS — I129 Hypertensive chronic kidney disease with stage 1 through stage 4 chronic kidney disease, or unspecified chronic kidney disease: Secondary | ICD-10-CM | POA: Diagnosis not present

## 2020-01-11 DIAGNOSIS — E1069 Type 1 diabetes mellitus with other specified complication: Secondary | ICD-10-CM | POA: Diagnosis present

## 2020-01-11 DIAGNOSIS — E860 Dehydration: Secondary | ICD-10-CM | POA: Diagnosis present

## 2020-01-11 DIAGNOSIS — F1210 Cannabis abuse, uncomplicated: Secondary | ICD-10-CM | POA: Diagnosis present

## 2020-01-11 DIAGNOSIS — E103599 Type 1 diabetes mellitus with proliferative diabetic retinopathy without macular edema, unspecified eye: Secondary | ICD-10-CM | POA: Diagnosis present

## 2020-01-11 DIAGNOSIS — I63441 Cerebral infarction due to embolism of right cerebellar artery: Secondary | ICD-10-CM | POA: Diagnosis not present

## 2020-01-11 DIAGNOSIS — E1110 Type 2 diabetes mellitus with ketoacidosis without coma: Secondary | ICD-10-CM | POA: Diagnosis present

## 2020-01-11 DIAGNOSIS — I160 Hypertensive urgency: Secondary | ICD-10-CM | POA: Diagnosis present

## 2020-01-11 DIAGNOSIS — R509 Fever, unspecified: Secondary | ICD-10-CM

## 2020-01-11 DIAGNOSIS — R000 Tachycardia, unspecified: Secondary | ICD-10-CM | POA: Diagnosis not present

## 2020-01-11 DIAGNOSIS — Z8349 Family history of other endocrine, nutritional and metabolic diseases: Secondary | ICD-10-CM

## 2020-01-11 DIAGNOSIS — R29818 Other symptoms and signs involving the nervous system: Secondary | ICD-10-CM | POA: Diagnosis not present

## 2020-01-11 DIAGNOSIS — I63232 Cerebral infarction due to unspecified occlusion or stenosis of left carotid arteries: Secondary | ICD-10-CM | POA: Diagnosis not present

## 2020-01-11 LAB — BETA-HYDROXYBUTYRIC ACID
Beta-Hydroxybutyric Acid: 3.73 mmol/L — ABNORMAL HIGH (ref 0.05–0.27)
Beta-Hydroxybutyric Acid: 3.08 mmol/L — ABNORMAL HIGH (ref 0.05–0.27)
Beta-Hydroxybutyric Acid: 0.94 mmol/L — ABNORMAL HIGH (ref 0.05–0.27)

## 2020-01-11 LAB — BASIC METABOLIC PANEL
Anion gap: 14 (ref 5–15)
Anion gap: 16 — ABNORMAL HIGH (ref 5–15)
Anion gap: 10 (ref 5–15)
BUN: 42 mg/dL — ABNORMAL HIGH (ref 6–20)
BUN: 41 mg/dL — ABNORMAL HIGH (ref 6–20)
BUN: 41 mg/dL — ABNORMAL HIGH (ref 6–20)
CO2: 19 mmol/L — ABNORMAL LOW (ref 22–32)
CO2: 17 mmol/L — ABNORMAL LOW (ref 22–32)
CO2: 22 mmol/L (ref 22–32)
Calcium: 8.4 mg/dL — ABNORMAL LOW (ref 8.9–10.3)
Calcium: 8.3 mg/dL — ABNORMAL LOW (ref 8.9–10.3)
Calcium: 8.1 mg/dL — ABNORMAL LOW (ref 8.9–10.3)
Chloride: 106 mmol/L (ref 98–111)
Chloride: 104 mmol/L (ref 98–111)
Chloride: 108 mmol/L (ref 98–111)
Creatinine, Ser: 3.65 mg/dL — ABNORMAL HIGH (ref 0.61–1.24)
Creatinine, Ser: 3.69 mg/dL — ABNORMAL HIGH (ref 0.61–1.24)
Creatinine, Ser: 3.71 mg/dL — ABNORMAL HIGH (ref 0.61–1.24)
GFR calc Af Amer: 24 mL/min — ABNORMAL LOW (ref >60)
GFR calc Af Amer: 24 mL/min — ABNORMAL LOW (ref >60)
GFR calc Af Amer: 23 mL/min — ABNORMAL LOW (ref >60)
GFR calc non Af Amer: 21 mL/min — ABNORMAL LOW (ref >60)
GFR calc non Af Amer: 20 mL/min — ABNORMAL LOW (ref >60)
GFR calc non Af Amer: 20 mL/min — ABNORMAL LOW (ref >60)
Glucose, Bld: 127 mg/dL — ABNORMAL HIGH (ref 70–99)
Glucose, Bld: 242 mg/dL — ABNORMAL HIGH (ref 70–99)
Glucose, Bld: 169 mg/dL — ABNORMAL HIGH (ref 70–99)
Potassium: 4 mmol/L (ref 3.5–5.1)
Potassium: 4.2 mmol/L (ref 3.5–5.1)
Potassium: 3.9 mmol/L (ref 3.5–5.1)
Sodium: 139 mmol/L (ref 135–145)
Sodium: 137 mmol/L (ref 135–145)
Sodium: 140 mmol/L (ref 135–145)

## 2020-01-11 LAB — CBC
HCT: 40.4 % (ref 39.0–52.0)
Hemoglobin: 13.9 g/dL (ref 13.0–17.0)
MCH: 30.2 pg (ref 26.0–34.0)
MCHC: 34.4 g/dL (ref 30.0–36.0)
MCV: 87.6 fL (ref 80.0–100.0)
Platelets: 312 10*3/uL (ref 150–400)
RBC: 4.61 MIL/uL (ref 4.22–5.81)
RDW: 13.2 % (ref 11.5–15.5)
WBC: 24.4 10*3/uL — ABNORMAL HIGH (ref 4.0–10.5)
nRBC: 0 % (ref 0.0–0.2)

## 2020-01-11 LAB — CBC WITH DIFFERENTIAL/PLATELET
Abs Immature Granulocytes: 0 10*3/uL (ref 0.00–0.07)
Basophils Absolute: 0 10*3/uL (ref 0.0–0.1)
Basophils Relative: 0 %
Eosinophils Absolute: 0 10*3/uL (ref 0.0–0.5)
Eosinophils Relative: 0 %
HCT: 34.5 % — ABNORMAL LOW (ref 39.0–52.0)
Hemoglobin: 11.8 g/dL — ABNORMAL LOW (ref 13.0–17.0)
Lymphocytes Relative: 5 %
Lymphs Abs: 1.3 10*3/uL (ref 0.7–4.0)
MCH: 30.4 pg (ref 26.0–34.0)
MCHC: 34.2 g/dL (ref 30.0–36.0)
MCV: 88.9 fL (ref 80.0–100.0)
Monocytes Absolute: 0.8 10*3/uL (ref 0.1–1.0)
Monocytes Relative: 3 %
Neutro Abs: 23.4 10*3/uL — ABNORMAL HIGH (ref 1.7–7.7)
Neutrophils Relative %: 92 %
Platelets: 203 10*3/uL (ref 150–400)
RBC: 3.88 MIL/uL — ABNORMAL LOW (ref 4.22–5.81)
RDW: 13.3 % (ref 11.5–15.5)
WBC: 25.4 10*3/uL — ABNORMAL HIGH (ref 4.0–10.5)
nRBC: 0 % (ref 0.0–0.2)
nRBC: 0 /100 WBC

## 2020-01-11 LAB — ABO/RH: ABO/RH(D): A POS

## 2020-01-11 LAB — CBG MONITORING, ED
Glucose-Capillary: 301 mg/dL — ABNORMAL HIGH (ref 70–99)
Glucose-Capillary: 120 mg/dL — ABNORMAL HIGH (ref 70–99)
Glucose-Capillary: 147 mg/dL — ABNORMAL HIGH (ref 70–99)
Glucose-Capillary: 196 mg/dL — ABNORMAL HIGH (ref 70–99)
Glucose-Capillary: 229 mg/dL — ABNORMAL HIGH (ref 70–99)
Glucose-Capillary: 238 mg/dL — ABNORMAL HIGH (ref 70–99)

## 2020-01-11 LAB — POCT I-STAT EG7
Acid-base deficit: 5 mmol/L — ABNORMAL HIGH (ref 0.0–2.0)
Bicarbonate: 18.7 mmol/L — ABNORMAL LOW (ref 20.0–28.0)
Calcium, Ion: 1.03 mmol/L — ABNORMAL LOW (ref 1.15–1.40)
HCT: 37 % — ABNORMAL LOW (ref 39.0–52.0)
Hemoglobin: 12.6 g/dL — ABNORMAL LOW (ref 13.0–17.0)
O2 Saturation: 96 %
Potassium: 4 mmol/L (ref 3.5–5.1)
Sodium: 137 mmol/L (ref 135–145)
TCO2: 20 mmol/L — ABNORMAL LOW (ref 22–32)
pCO2, Ven: 29 mmHg — ABNORMAL LOW (ref 44.0–60.0)
pH, Ven: 7.417 (ref 7.250–7.430)
pO2, Ven: 80 mmHg — ABNORMAL HIGH (ref 32.0–45.0)

## 2020-01-11 LAB — POC OCCULT BLOOD, ED: Fecal Occult Bld: NEGATIVE

## 2020-01-11 LAB — GLUCOSE, CAPILLARY
Glucose-Capillary: 117 mg/dL — ABNORMAL HIGH (ref 70–99)
Glucose-Capillary: 131 mg/dL — ABNORMAL HIGH (ref 70–99)
Glucose-Capillary: 148 mg/dL — ABNORMAL HIGH (ref 70–99)
Glucose-Capillary: 152 mg/dL — ABNORMAL HIGH (ref 70–99)

## 2020-01-11 LAB — COMPREHENSIVE METABOLIC PANEL
ALT: 22 U/L (ref 0–44)
AST: 27 U/L (ref 15–41)
Albumin: 4.6 g/dL (ref 3.5–5.0)
Alkaline Phosphatase: 101 U/L (ref 38–126)
Anion gap: 24 — ABNORMAL HIGH (ref 5–15)
BUN: 45 mg/dL — ABNORMAL HIGH (ref 6–20)
CO2: 17 mmol/L — ABNORMAL LOW (ref 22–32)
Calcium: 9.2 mg/dL (ref 8.9–10.3)
Chloride: 95 mmol/L — ABNORMAL LOW (ref 98–111)
Creatinine, Ser: 4.24 mg/dL — ABNORMAL HIGH (ref 0.61–1.24)
GFR calc Af Amer: 20 mL/min — ABNORMAL LOW (ref >60)
GFR calc non Af Amer: 17 mL/min — ABNORMAL LOW (ref >60)
Glucose, Bld: 299 mg/dL — ABNORMAL HIGH (ref 70–99)
Potassium: 4 mmol/L (ref 3.5–5.1)
Sodium: 136 mmol/L (ref 135–145)
Total Bilirubin: 1.2 mg/dL (ref 0.3–1.2)
Total Protein: 7.3 g/dL (ref 6.5–8.1)

## 2020-01-11 LAB — TROPONIN I (HIGH SENSITIVITY)
Troponin I (High Sensitivity): 38 ng/L — ABNORMAL HIGH (ref <18)
Troponin I (High Sensitivity): 34 ng/L — ABNORMAL HIGH (ref <18)

## 2020-01-11 LAB — AMMONIA: Ammonia: 15 umol/L (ref 9–35)

## 2020-01-11 LAB — RAPID URINE DRUG SCREEN, HOSP PERFORMED
Amphetamines: NOT DETECTED
Barbiturates: NOT DETECTED
Benzodiazepines: NOT DETECTED
Cocaine: NOT DETECTED
Opiates: NOT DETECTED
Tetrahydrocannabinol: POSITIVE — AB

## 2020-01-11 LAB — RESPIRATORY PANEL BY RT PCR (FLU A&B, COVID)
Influenza A by PCR: NEGATIVE
Influenza B by PCR: NEGATIVE
SARS Coronavirus 2 by RT PCR: NEGATIVE

## 2020-01-11 LAB — HEMOGLOBIN A1C
Hgb A1c MFr Bld: 7.2 % — ABNORMAL HIGH (ref 4.8–5.6)
Mean Plasma Glucose: 159.94 mg/dL

## 2020-01-11 LAB — TYPE AND SCREEN
ABO/RH(D): A POS
Antibody Screen: NEGATIVE

## 2020-01-11 LAB — ETHANOL: Alcohol, Ethyl (B): <10 mg/dL (ref <10)

## 2020-01-11 MED ORDER — POTASSIUM CHLORIDE 10 MEQ/100ML IV SOLN
10.0000 meq | INTRAVENOUS | Status: DC
  Filled 2020-01-11 (×2): qty 100

## 2020-01-11 MED ORDER — SODIUM CHLORIDE 0.9 % IV SOLN: INTRAVENOUS | Status: DC

## 2020-01-11 MED ORDER — LORAZEPAM BOLUS VIA INFUSION: 2.0000 mg | Freq: Once | INTRAVENOUS | Status: DC

## 2020-01-11 MED ORDER — THIAMINE HCL 100 MG/ML IJ SOLN
100.0000 mg | Freq: Every day | INTRAMUSCULAR | Status: DC
  Administered 2020-01-11 – 2020-01-12 (×2): 100 mg via INTRAVENOUS
  Filled 2020-01-11 (×3): qty 2

## 2020-01-11 MED ORDER — LORAZEPAM 2 MG/ML IJ SOLN
2.0000 mg | Freq: Once | INTRAMUSCULAR | Status: AC
  Administered 2020-01-11: 2 mg via INTRAVENOUS
  Filled 2020-01-11: qty 1

## 2020-01-11 MED ORDER — INSULIN REGULAR(HUMAN) IN NACL 100-0.9 UT/100ML-% IV SOLN
INTRAVENOUS | Status: DC
  Filled 2020-01-11: qty 100

## 2020-01-11 MED ORDER — DEXTROSE 50 % IV SOLN: 0.0000 mL | INTRAVENOUS | Status: DC | PRN

## 2020-01-11 MED ORDER — THIAMINE HCL 100 MG PO TABS
100.0000 mg | ORAL_TABLET | Freq: Every day | ORAL | Status: DC
  Administered 2020-01-13: 100 mg via ORAL
  Filled 2020-01-11: qty 1

## 2020-01-11 MED ORDER — INSULIN ASPART 100 UNIT/ML ~~LOC~~ SOLN
6.0000 [IU] | Freq: Once | SUBCUTANEOUS | Status: AC
  Administered 2020-01-11: 6 [IU] via INTRAVENOUS

## 2020-01-11 MED ORDER — LORAZEPAM 2 MG/ML IJ SOLN
1.0000 mg | Freq: Once | INTRAMUSCULAR | Status: AC
  Administered 2020-01-11: 1 mg via INTRAVENOUS
  Filled 2020-01-11: qty 1

## 2020-01-11 MED ORDER — INSULIN REGULAR(HUMAN) IN NACL 100-0.9 UT/100ML-% IV SOLN
INTRAVENOUS | Status: DC
  Administered 2020-01-11: 3 [IU]/h via INTRAVENOUS

## 2020-01-11 MED ORDER — SODIUM CHLORIDE 0.9 % IV SOLN
8.0000 mg/h | INTRAVENOUS | Status: DC
  Administered 2020-01-11: 8 mg/h via INTRAVENOUS
  Filled 2020-01-11: qty 80

## 2020-01-11 MED ORDER — SODIUM CHLORIDE 0.9 % IV SOLN
80.0000 mg | Freq: Once | INTRAVENOUS | Status: AC
  Administered 2020-01-11: 80 mg via INTRAVENOUS
  Filled 2020-01-11: qty 80

## 2020-01-11 MED ORDER — PROMETHAZINE HCL 25 MG/ML IJ SOLN
25.0000 mg | Freq: Once | INTRAMUSCULAR | Status: AC
  Administered 2020-01-11: 25 mg via INTRAVENOUS
  Filled 2020-01-11: qty 1

## 2020-01-11 MED ORDER — SODIUM CHLORIDE 0.9 % IV BOLUS
1000.0000 mL | Freq: Once | INTRAVENOUS | Status: AC
  Administered 2020-01-11: 1000 mL via INTRAVENOUS

## 2020-01-11 MED ORDER — POTASSIUM CHLORIDE 10 MEQ/100ML IV SOLN
10.0000 meq | INTRAVENOUS | Status: AC
  Administered 2020-01-11 (×2): 10 meq via INTRAVENOUS

## 2020-01-11 MED ORDER — DEXTROSE 50 % IV SOLN
0.0000 mL | INTRAVENOUS | Status: DC | PRN
  Administered 2020-01-12 (×2): 25 mL via INTRAVENOUS
  Filled 2020-01-11 (×2): qty 50

## 2020-01-11 MED ORDER — DEXTROSE-NACL 5-0.45 % IV SOLN: INTRAVENOUS | Status: DC

## 2020-01-11 MED ORDER — PROCHLORPERAZINE EDISYLATE 10 MG/2ML IJ SOLN
5.0000 mg | Freq: Four times a day (QID) | INTRAMUSCULAR | Status: DC | PRN
  Administered 2020-01-11: 5 mg via INTRAVENOUS
  Filled 2020-01-11: qty 2

## 2020-01-11 NOTE — H&P (Addendum)
[facility] Admission History and Physical Service Pager: [phone]  Patient name: George [name] record number: [id number] Date of birth: [birth year] Age: 34 y.o. Gender: male  Primary Care Provider: Elsie Stain, MD Consultants: Neurology Code Status: Full Preferred Emergency Contact: Mother, George [name] [phone]  Chief Complaint: Hematemesis   Assessment and Plan: George [name] is a 34 y.o. male presenting with hematemesis and labs concerning for DKA. PMH is significant for history of CVA, type 1 diabetes, prior DKA, hypertension, CKD 3, anemia, GERD  N/V likely 2/2 to mild DKA  T1DM  Patient presented to the ED after 3 days of vomiting that progressed to bloody emesis prompting patient to seek care.   Home medications include Novolin N and R. Mom says pt took his insulin yesterday. Recent CBGs reported as 120-320.  Patient states he did take insulin yesterday bc he couldn't swallow pills due to the vomiting.  Patient tachycardic (HR 120s) and hypertensive (SBP 170s). BHB 3.73, CBG 300, bicarb 17, pH 7.4 with elevated anion gap 24. A1c 7.2. Labs concerning for mild DKA, may have a normal pH in the setting of gastric losses with recurrent vomiting.  Etiology of DKA unclear, question recent insulin noncompliance, however his diabetes does appear to be quite controlled on a chronic basis.  Patient received 2x 1 L boluses in the ED started on insulin drip and given IV potassium.  Patient was given Protonix infusion for GI prophylaxis.  Patient removed his IV.  Prior admissions patient agitated and required restraints.Toxic ingestion is possible however less likely.  CNS abnormalities possible however less likely given no acute abnormalities found on head CT. Pancreatitis; patient complaining epigastric abdominal pain with vomiting.  However was nontender on exam.  CT abdomen pelvis obtained had no acute findings and pancreas was unremarkable.   UDS positive for THC, it is possible there is an element of cyclic vomiting syndrome.  ED staff was concerned that patient could be withdrawing from alcohol. Patient given 1 mg IV Ativan in ED.  We will monitor CIWA's.  Consider Mallory-Weiss tear and endoscopic evaluation if not clinically improving. - Admit to progressive, attending Dr. Gwendlyn Deutscher  - Neurology consulted, appreciate recommendations  - NPO - DKA protocol: Insulin 100 units/100 mL drip - CBG fluid titration: D5 1/2 NS or NS at 75 mL/h - Monitor CBGs  - Cardiac Monitoring, pulse oximetry  - Q4H BMP  - BHB q8h  - Vitals q6h  - s/p 2x 1L NS bolus - s/p Protonix infusion - s/p KCl IV qhr -Compazine 5mg  IV every 6 hours as needed -Diabetes educatior consult  - Follow up EtOH  - Follow up UA   Acute encephalopathy/AMS  subacute CVA  History of CVA Patient had inappropriate answers to orientation questions, thus head CT was obtained. Ammonia 15. CT head indicated 2 cm subacute infarct in the cerebellum that was not present on previous head imaging.  Per mom, patient had 2 ischemic strokes in September and June.  CT head shows old left PCA distribution infarction.  Neurology was consulted and is to see patient. Home medications include Plavix 75 mg daily, Crestor 20 mg daily.  Do question his altered mental status, alert and can follow commands however answering most questions inappropriately, would not expect his electrolyte abnormalities with his mild DKA to precipitate such changes.  Could consider acute infarcts not visualized on CT vs alcohol withdrawal as he was noted to be tremulous and diaphoretic later this afternoon (however no  recent significant history of alcohol use, ethanol level < 10). UDS only positive for THC and ammonia WNL.  While do note a leukocytosis of 25, have low concern for a septic etiology of his AMS.  Felt to have cryptogenic strokes after previous work-up including TEE without evidence of embolus or PFO and  14-day Holter monitor without evidence of arrhythmia. -Neurology consulted, appreciate recommendation  -Agreed with MRI brain, recommended MRA head and neck in addition  -Consider repeat TEE  -Obtain blood cultures, B12, TSH, B1 levels -Follow-up on MRI brain -Hold home medication while NPO -CIWA, call for Ativan  Hematemesis, concern for Mallory-Weiss tear: Improved. Patient with vomiting that progressed to blood-tinged emesis over the past several days, suspect small Mallory-Weiss tear due to frequent retching.  Reassuringly hemoglobin appears stable without concern for hemorrhage/large bleed, suspect initial hemoglobin was concentrated due to dehydration.  FOBT negative without evidence of bleeding elsewhere.  Currently on a Protonix infusion, may transition to twice daily tomorrow.  If has persistent bleeding, will consider GI evaluation for EGD. -Continue Protonix infusion -Compazine IV as needed  -Monitor hemoglobin -Consider GI evaluation if persistent   Acute on CKD Stage 4  Creatine 4.24, suspect prerenal in the setting of dehydration with mild DKA/vomiting.  Creatine baseline ~3 in Sept 2020.  Patient follows with [facility].  - Consider consulting nephrology  if not improving -Monitor BMP   HTN BP on admission 178/105.  Home medications include 10 mg of amlodipine daily, carvedilol 25 mg twice daily, clonidine 0.2 mg twice daily, hydralazine 10 mg 3 times daily -- Hold home medication while NPO -May add IV hydralazine if BP > 180/110 mmhg, allowing permissive given CT findings/possible new CVA as discussed above  Anemia Hemoglobin on admission 13.9, MCV 87.6.  Home medications include iron supplementation 325 mg daily. FOBT in ED was negative.  - Hold home medication while NPO - Follow up PM CBC   GERD Medications include Protonix 40 mg daily.  Patient given IV pantoprazole in the ED. -Hold home medication while NPO   Concern for alcohol abuse Patient given  thiamine 100 mg injection.  - Sitter  - CIWA's for concerns of withdrawal -Thiamine 100 mg daily    FEN/GI: 50mL/hr NS, if CBG < 250 change IV fluids to D5 1/2NS, NPO  Prophylaxis: SCDs   Disposition: Admit to cardiac telemetry   History of Present Illness:  George [name] is a 34 y.o. male presenting with vomiting for the past 3 days.  Emesis turned bloody and EMS was called.  Patient was unable to take daily medications due to vomiting.   Diagnosed with type 1 diabetes at 79.  Per his mom, CBGs between 120 and 320 in the past couple days.  She saw patient take his Novolin yesterday.  Patient states his CBGs "running 300's". Patient says he takes insulin regularly, but when asked about his regimen states " I dont know."  Mom is unsure of his regimen but located his Novlin N and R in his room. Reports her son is independent and manages his own medications.  Patient says he is not taking any medications other than insulin.  Patient with 3-day history of vomiting, initially without blood however blood started showing up.  Mom reports she had 5-6 emetic episodes yesterday.  Currently nauseous and complains of epigastric abdominal pain.  Had similar presentation with previous DKA.  Additionally reports polyuria, polydipsia and some ?hematuria.  Denies blood in stool, hallucinations and delusions.  [facility]:  [facility] and [facility] neurology.   In the ED, initial CBG 301, anion gap 24, BHB 3.73, bicarb 17, pH 7.42, potassium 4, WBC 24.4 and hemoglobin stable 13.9. FOBT negative. Covid and influenza testing negative.  Patient received two 1 L boluses and started on insulin drip in the ED.  Patient admitted for DKA.  Review Of Systems: Per HPI with the following additions:   Review of Systems  Constitutional: Negative for fever.  HENT: Negative for sore throat.   Eyes: Negative for blurred vision.  Respiratory: Negative for cough and shortness of breath.    Cardiovascular: Negative for chest pain and palpitations.  Gastrointestinal: Positive for abdominal pain, nausea and vomiting. Negative for blood in stool, constipation and diarrhea.  Genitourinary: Positive for frequency and hematuria. Negative for dysuria.  Musculoskeletal: Negative for back pain, joint pain, myalgias and neck pain.  Neurological: Negative for headaches.  Psychiatric/Behavioral: Negative for hallucinations.  All other systems reviewed and are negative.   Patient Active Problem List   Diagnosis Date Noted  . DKA (diabetic ketoacidoses) ([place]) 01/11/2020  . Proliferative diabetic retinopathy associated with type 1 diabetes mellitus ([place]) 12/19/2019  . 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) 09/19/2019  . Tobacco use disorder 06/27/2019  . Hyperlipidemia due to type 1 diabetes mellitus ([place]) 06/27/2019  . HTN (hypertension)   . History of CVA (cerebrovascular accident) 06/13/2019  . Diabetes mellitus type I ([place])   . CKD (chronic kidney disease), stage III     Past [name] History: Past [name] History:  Diagnosis Date  . AKI (acute kidney injury) ([place])   . Cerebral infarction ([place])   . Hypertension   . Neurologic deficit due to acute ischemic cerebrovascular accident (CVA) ([place]) 06/13/2019  . Supraventricular tachycardia ([place])   . Type 1 diabetes ([place])     Past Surgical History: Past Surgical History:  Procedure Laterality Date  . BUBBLE STUDY  06/20/2019   Procedure: BUBBLE STUDY;  Surgeon: Fay Records, MD;  Location: [facility];  Service: Cardiovascular;;  . TEE WITHOUT CARDIOVERSION N/A 06/20/2019   Procedure: TRANSESOPHAGEAL ECHOCARDIOGRAM (TEE);  Surgeon: Fay Records, MD;  Location: [facility] ENDOSCOPY;  Service: Cardiovascular;  Laterality: N/A;    Social History: Social History   Tobacco Use  . Smoking status: Current Every Day Smoker    Packs/day: 0.25    Types: Cigarettes    Start date: 12/29/2010   . Smokeless tobacco: Never Used  Substance Use Topics  . Alcohol use: Not Currently    Comment: 6 beers/week  . Drug use: Not Currently   Additional social history:  Please also refer to relevant sections of EMR.  Family History: Family History  Problem Relation Age of Onset  . Hypertension Mother   . Diabetes Mother   . Hyperlipidemia Mother   . Diabetes Father      Allergies and Medications: No Known Allergies No current facility-administered medications on file prior to encounter.   Current Outpatient Medications on File Prior to Encounter  Medication Sig Dispense Refill  . acetaminophen (TYLENOL) 325 MG tablet Take 650 mg by mouth every 6 (six) hours as needed for headache.    Marland Kitchen amLODipine (NORVASC) 10 MG tablet Take 1 tablet (10 mg total) by mouth daily. 30 tablet 5  . carvedilol (COREG) 25 MG tablet Take 1 tablet (25 mg total) by mouth 2 (two) times daily with a meal. 180 tablet 1  . cloNIDine (CATAPRES) 0.2 MG tablet Take  1 tablet (0.2 mg total) by mouth 2 (two) times daily. 60 tablet 5  . clopidogrel (PLAVIX) 75 MG tablet Take 1 tablet (75 mg total) by mouth daily. 30 tablet 2  . ferrous sulfate 325 (65 FE) MG tablet Take 1 tablet (325 mg total) by mouth daily with breakfast. 30 tablet 6  . hydrALAZINE (APRESOLINE) 10 MG tablet Take 1 tablet (10 mg total) by mouth every 8 (eight) hours. 90 tablet 2  . insulin NPH Human (NOVOLIN N) 100 UNIT/ML injection Inject 0.08 mLs (8 Units total) into the skin 2 (two) times daily with a meal. Take 8 in the am and 12 at night (Patient taking differently: Inject 8-12 Units into the skin See admin instructions. Take 8 in the am and 12 at night) 10 mL 3  . insulin regular (NOVOLIN R) 100 units/mL injection Inject 0.04 mLs (4 Units total) into the skin See admin instructions. Sliding Scale: 4 units three times daily before meals, then increase by 1 unit at each dose for glucose greater than 180. 3 mL 3  . pantoprazole (PROTONIX) 40 MG  tablet Take 1 tablet (40 mg total) by mouth daily. For stomach protection 30 tablet 11  . rosuvastatin (CRESTOR) 20 MG tablet Take 1 tablet (20 mg total) by mouth daily. 30 tablet 4  . glucose blood (ONETOUCH VERIO) test strip Use as instructed to check blood sugar 3 times daily. 100 each 11    Objective: BP (!) 178/105   Pulse (!) 116   Temp 98.3 F (36.8 C) (Oral)   Resp 20   Ht 5\' 3"  (1.6 m)   Wt 56.7 kg   SpO2 100%   BMI 22.14 kg/m   Exam: GEN:     alert, uncomfortable appearing, in no acute distress            HENT:  mucus membranes dry,  nares patent, no nasal discharge  EYES:   pupils equal and reactive, EOM intact NECK:  supple, normal ROM RESP:   clear to auscultation bilaterally, no increased work of breathing  CVS:      tachycardic rate and rhythm, no murmur, distal pulses intact ABD:    soft, non-tender; bowel sounds present; no palpable masses EXT:     normal ROM, atraumatic, no extremity edema  NEURO:   Strength 5 out of 5 upper and lower extremities, alert but inappropriate responses to verbal questions (What year is it "8th" , Whats your name "august 12th", Where are you " working on it" ), consistently will answer "just a little bit " that does not correlate to the question asked.  Can follow some commands, gait normal.  Smile symmetrical with understandable speech.  EOMI.  Skin:    warm and dry Psych: Denies HI, SI, visual or auditory hallucinations  Labs and Imaging: CBC BMET  Recent Labs  Lab 01/11/20 0918 01/11/20 1139  WBC 24.4*  --   HGB 13.9 12.6*  HCT 40.4 37.0*  PLT 312  --    Recent Labs  Lab 01/11/20 0918 01/11/20 1139  NA 136 137  K 4.0 4.0  CL 95*  --   CO2 17*  --   BUN 45*  --   CREATININE 4.24*  --   GLUCOSE 299*  --   CALCIUM 9.2  --      EKG: Sinus tachycardia, Left posterior fascicular block, Cannot rule out Anterior infarct , age undetermined Confirmed by Isla Pence [phone]) on 01/11/2020 9:55:19 AM  CT ABDOMEN  PELVIS  WO CONTRAST  Result Date: 01/11/2020 CLINICAL DATA:  Pt arrives via [facility] from home for c/o coffee ground emesis for the past 2 days, hypertensive upon ems arrival. Pt given 8mg  zofran en route, pt inducing vomiting in triage. Denies cough, sob, fevers or chills. Has extensive [name] history. EMS VS: 180/110, 130HR, 99% on RA, 313 CBG, 380cc NS. Not on blood thinners currently. A/ox4. EXAM: CT ABDOMEN AND PELVIS WITHOUT CONTRAST TECHNIQUE: Multidetector CT imaging of the abdomen and pelvis was performed following the standard protocol without IV contrast. COMPARISON:  09/23/2019 FINDINGS: Lower chest: No acute abnormality. Hepatobiliary: No focal liver abnormality is seen. No gallstones, gallbladder wall thickening, or biliary dilatation. Pancreas: Unremarkable. No pancreatic ductal dilatation or surrounding inflammatory changes. Spleen: Normal in size without focal abnormality. Adrenals/Urinary Tract: Adrenal glands are unremarkable. Kidneys are normal, without renal calculi, focal lesion, or hydronephrosis. Bladder is unremarkable. Stomach/Bowel: Stomach is unremarkable. Small bowel and colon are normal in caliber. No wall thickening. No inflammation. No evidence appendicitis. Vascular/Lymphatic: No significant vascular findings are present. No enlarged abdominal or pelvic lymph nodes. Reproductive: Unremarkable. Other: No abdominal wall hernia or abnormality. No abdominopelvic ascites. Musculoskeletal: No acute or significant osseous findings. IMPRESSION: 1. No acute findings. Exam within normal limits. No findings to account for coffee ground emesis. No evidence of bowel obstruction or inflammation. Electronically Signed   By: Lajean Manes M.D.   On: 01/11/2020 15:27   CT HEAD WO CONTRAST  Result Date: 01/11/2020 CLINICAL DATA:  Triage note: Pt arrives via [facility] from home for c/o coffee ground emesis for the past 2 days, hypertensive upon ems arrival. Pt given 8mg  zofran en route, pt inducing vomiting in  triage. Denies cough, sob, fevers or chills. Has extensive [name] history. EMS VS: 180/110, 130HR, 99% on RA, 313 CBG, 380cc NS. Not on blood thinners currently. A/ox4 EXAM: CT HEAD WITHOUT CONTRAST TECHNIQUE: Contiguous axial images were obtained from the base of the skull through the vertex without intravenous contrast. COMPARISON:  09/23/2019 FINDINGS: Brain: In the right inferior cerebellum, there is an oval area of hypoattenuation, relatively well-defined, which involves the corticomedullary junction, measuring 2 cm in greatest dimension, representing a change from prior exam. This is consistent a subacute infarct. Well-defined encephalomalacia is noted in the left posterior temporal adjacent occipital lobes reflecting an old left PCA distribution infarct, stable from prior exam Ventricles normal in size and configuration. There are no parenchymal masses or mass effect. No other evidence of infarction. There are no extra-axial masses or abnormal fluid collections. There is no intracranial hemorrhage. Vascular: No hyperdense vessel or unexpected calcification. Skull: Normal. Negative for fracture or focal lesion. Sinuses/Orbits: Normal globes and orbits. Visualized sinuses and mastoid air cells are clear. Other: None. IMPRESSION: 1. 2 cm area of hypoattenuation in the peripheral, inferior right cerebellum consistent with a subacute infarct, new since the prior head CT. 2. No other evidence of an acute or recent abnormality. 3. Old left PCA distribution infarction. Electronically Signed   By: Lajean Manes M.D.   On: 01/11/2020 15:39     Lyndee Hensen, DO 01/11/2020, 4:28 PM PGY-1, [place] Intern pager: [phone], text pages welcome  FPTS Upper-Level Resident Addendum   I have independently interviewed and examined the patient. I have discussed the above with the original author and agree with their documentation. My edits for correction/addition/clarification are in green.  Please see also any attending notes.    Patriciaann Clan, DO  Family Medicine PGY-2

## 2020-01-11 NOTE — Consult Note (Signed)
Requesting Physician: Dr. Andrena Mews    Chief Complaint:  History obtained from: Patient and Chart    HPI:                                                                                                                                       George [name] is a 34 y.o. male with past medical history significant for diabetes ketoacidosis,, type 1 diabetes mellitus, type 1 diabetes mellitus, hypertension, CKD stage III presenting with hematemesis.  Work-up in the emergency department concerning for mild DKA and patient started on insulin drip.  On assessment, patient noted to be answering inappropriately to questions and CT head was obtained which was concerning for subacute infarct in the right cerebellum.  Neurology was consulted for further recommendations.   Chart review: 06/02/2019: Left PCA infarction, TEE was negative for PFO or cardiac source of embolus.  Lower extremity venous Dopplers negative.  Hypercoagulable work-up negative. 09/25/2019: Admitted with DKA, had multiple bilateral infarcts on MRI.   Date last known well: unknown, possibly 01/08/20 tPA Given: no, outside tPA window NIHSS: 2 Baseline MRS 3   Past Medical History:  Diagnosis Date  . AKI (acute kidney injury) ([place])   . Cerebral infarction ([place])   . Hypertension   . Neurologic deficit due to acute ischemic cerebrovascular accident (CVA) ([place]) 06/13/2019  . Supraventricular tachycardia ([place])   . Type 1 diabetes [facility])     Past Surgical History:  Procedure Laterality Date  . BUBBLE STUDY  06/20/2019   Procedure: BUBBLE STUDY;  Surgeon: Fay Records, MD;  Location: [place];  Service: Cardiovascular;;  . TEE WITHOUT CARDIOVERSION N/A 06/20/2019   Procedure: TRANSESOPHAGEAL ECHOCARDIOGRAM (TEE);  Surgeon: Fay Records, MD;  Location: [facility] ENDOSCOPY;  Service: Cardiovascular;  Laterality: N/A;    Family History  Problem Relation Age of Onset  . Hypertension Mother   . Diabetes Mother   . Hyperlipidemia  Mother   . Diabetes Father    Social History:  reports that he has been smoking cigarettes. He started smoking about 9 years ago. He has been smoking about 0.25 packs per day. He has never used smokeless tobacco. He reports previous alcohol use. He reports previous drug use.  Allergies: No Known Allergies  Medications:  I reviewed home medications   ROS:                                                                                                                                     14 systems reviewed and negative except above    Examination:                                                                                                      General: Appears well-developed  Psych: Affect appropriate to situation Eyes: No scleral injection HENT: No OP obstrucion Head: Normocephalic.  Cardiovascular: Normal rate and regular rhythm.  Respiratory: Effort normal and breath sounds normal to anterior ascultation GI: Soft.  No distension. There is no tenderness.  Skin: WDI    Neurological Examination Mental Status: Alert, confused.  Not oriented to place, month.  Incorrectly stated his age. Able to name objects correctly, repetition intact.  Poor 5-minute recall: 0 out of 5 words correctly.  Difficulty reading using incorrect words. Cranial Nerves: II: Visual fields grossly normal III,IV, VI: ptosis not present, extra-ocular motions intact bilaterally, pupils equal, round, reactive to light and accommodation V,VII: smile symmetric, facial light touch sensation normal bilaterally VIII: hearing normal bilaterally IX,X: uvula rises symmetrically XI: bilateral shoulder shrug XII: midline tongue extension Motor: Right : Upper extremity   5/5    Left:     Upper extremity   5/5  Lower extremity   5/5     Lower extremity   5/5 Tone and bulk:normal tone throughout; no  atrophy noted Sensory: Pinprick and light touch intact throughout, bilaterally Deep Tendon Reflexes: 1+ and symmetric throughout Plantars: Right: downgoing   Left: downgoing Cerebellar: normal finger-to-nose, normal rapid alternating movements and normal heel-to-shin test Gait: Not tested   Lab Results: Basic Metabolic Panel: Recent Labs  Lab 01/11/20 0918 01/11/20 1139 01/11/20 1544  NA 136 137 139  K 4.0 4.0 4.0  CL 95*  --  106  CO2 17*  --  19*  GLUCOSE 299*  --  127*  BUN 45*  --  42*  CREATININE 4.24*  --  3.65*  CALCIUM 9.2  --  8.4*    CBC: Recent Labs  Lab 01/11/20 0918 01/11/20 1139 01/11/20 1800  WBC 24.4*  --  25.4*  NEUTROABS  --   --  PENDING  HGB 13.9 12.6* 11.8*  HCT 40.4 37.0* 34.5*  MCV 87.6  --  88.9  PLT 312  --  203    Coagulation  Studies: No results for input(s): LABPROT, INR in the last 72 hours.  Imaging: CT ABDOMEN PELVIS WO CONTRAST  Result Date: 01/11/2020 CLINICAL DATA:  Pt arrives via [facility] from home for c/o coffee ground emesis for the past 2 days, hypertensive upon ems arrival. Pt given 8mg  zofran en route, pt inducing vomiting in triage. Denies cough, sob, fevers or chills. Has extensive medical history. EMS VS: 180/110, 130HR, 99% on RA, 313 CBG, 380cc NS. Not on blood thinners currently. A/ox4. EXAM: CT ABDOMEN AND PELVIS WITHOUT CONTRAST TECHNIQUE: Multidetector CT imaging of the abdomen and pelvis was performed following the standard protocol without IV contrast. COMPARISON:  09/23/2019 FINDINGS: Lower chest: No acute abnormality. Hepatobiliary: No focal liver abnormality is seen. No gallstones, gallbladder wall thickening, or biliary dilatation. Pancreas: Unremarkable. No pancreatic ductal dilatation or surrounding inflammatory changes. Spleen: Normal in size without focal abnormality. Adrenals/Urinary Tract: Adrenal glands are unremarkable. Kidneys are normal, without renal calculi, focal lesion, or hydronephrosis. Bladder is  unremarkable. Stomach/Bowel: Stomach is unremarkable. Small bowel and colon are normal in caliber. No wall thickening. No inflammation. No evidence appendicitis. Vascular/Lymphatic: No significant vascular findings are present. No enlarged abdominal or pelvic lymph nodes. Reproductive: Unremarkable. Other: No abdominal wall hernia or abnormality. No abdominopelvic ascites. Musculoskeletal: No acute or significant osseous findings. IMPRESSION: 1. No acute findings. Exam within normal limits. No findings to account for coffee ground emesis. No evidence of bowel obstruction or inflammation. Electronically Signed   By: Lajean Manes M.D.   On: 01/11/2020 15:27   CT HEAD WO CONTRAST  Result Date: 01/11/2020 CLINICAL DATA:  Triage note: Pt arrives via [facility] from home for c/o coffee ground emesis for the past 2 days, hypertensive upon ems arrival. Pt given 8mg  zofran en route, pt inducing vomiting in triage. Denies cough, sob, fevers or chills. Has extensive medical history. EMS VS: 180/110, 130HR, 99% on RA, 313 CBG, 380cc NS. Not on blood thinners currently. A/ox4 EXAM: CT HEAD WITHOUT CONTRAST TECHNIQUE: Contiguous axial images were obtained from the base of the skull through the vertex without intravenous contrast. COMPARISON:  09/23/2019 FINDINGS: Brain: In the right inferior cerebellum, there is an oval area of hypoattenuation, relatively well-defined, which involves the corticomedullary junction, measuring 2 cm in greatest dimension, representing a change from prior exam. This is consistent a subacute infarct. Well-defined encephalomalacia is noted in the left posterior temporal adjacent occipital lobes reflecting an old left PCA distribution infarct, stable from prior exam Ventricles normal in size and configuration. There are no parenchymal masses or mass effect. No other evidence of infarction. There are no extra-axial masses or abnormal fluid collections. There is no intracranial hemorrhage. Vascular: No  hyperdense vessel or unexpected calcification. Skull: Normal. Negative for fracture or focal lesion. Sinuses/Orbits: Normal globes and orbits. Visualized sinuses and mastoid air cells are clear. Other: None. IMPRESSION: 1. 2 cm area of hypoattenuation in the peripheral, inferior right cerebellum consistent with a subacute infarct, new since the prior head CT. 2. No other evidence of an acute or recent abnormality. 3. Old left PCA distribution infarction. Electronically Signed   By: Lajean Manes M.D.   On: 01/11/2020 15:39     ASSESSMENT AND PLAN  34 y.o. male with past medical history significant for diabetes ketoacidosis,, type 1 diabetes mellitus, type 1 diabetes mellitus, hypertension, CKD stage III presenting with vomiting x3 days presenting with DKA.  Patient confused on exam and CT head showing a subacute cerebellar infarct.  Location of infarct should not explain patient's  altered mental status, possibly recrudescence of his old stroke symptoms in the setting of DKA ( aphasia in the past) versus multifocal infarcts not visualized on CT scan.  Acute metabolic encephalopathy: In the setting of DKA and possibly from acute CVA Subacute right cerebellar infarct   Recommendations MRI brain to evaluate for new infarcts MR angiogram of the head and neck to look for stenosis Consider repeating TEE ( last one was 05/2019) and placement of Linq device Screen for  ARCADIA clnical trial for cryptogenic stroke ( has borderline dilated LA) Consider TCD shunt study for pulmonary source of shunting  Obtain blood cultures Urine drug screen Check B1, B12, TSH,  IV thiamine Hold aspirin until blood cultures, echo Permissive hypertension up to 180/110 mmhg Frequent neurochecks every 4 hours Swallow eval before starting diet Repeat A1c, lipid profile    Shalane Florendo [facility] Pager Number [phone]

## 2020-01-11 NOTE — H&P (Signed)
I evaluated patient before 5 PM and discussed the treatment plan with the resident. I will cosign their notes once it is completed.

## 2020-01-11 NOTE — ED Notes (Signed)
Got patient cbg it was 68 notified RN Cindy of blood sugar patient is resting with call bell in reach

## 2020-01-11 NOTE — ED Notes (Signed)
Pt declined to have anyone called to update

## 2020-01-11 NOTE — ED Notes (Addendum)
The RN checked on pt, gown and monitor on the ground, blood drops all over room. Pt says he does not remember pulling out his IV and walking around his room. 2nd time pt has pulled out own IV this morning.

## 2020-01-11 NOTE — ED Provider Notes (Signed)
[facility] EMERGENCY DEPARTMENT Provider Note   CSN: [id number] Arrival date & time: 01/11/20  [id number]     History Chief Complaint  Patient presents with  . Hematemesis    George [name] is a 34 y.o. male.  Pt presents to the ED today with vomiting blood.  He has been nauseous for 3 days, but then started vomiting blood 2 days ago.  Pt was given 8 mg of zofran by EMS.  He has not been able to keep down his meds at home.  He has a hx of type 1 DM and said his blood sugars have been in the 300s.  He has never had gi bleeding in the past.  He is not on blood thinners.        Past Medical History:  Diagnosis Date  . AKI (acute kidney injury) ([place])   . Cerebral infarction ([place])   . Hypertension   . Neurologic deficit due to acute ischemic cerebrovascular accident (CVA) ([place]) 06/13/2019  . Supraventricular tachycardia ([place])   . Type 1 diabetes [facility])     Patient Active Problem List   Diagnosis Date Noted  . Proliferative diabetic retinopathy associated with type 1 diabetes mellitus ([place]) 12/19/2019  . 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) 09/19/2019  . Tobacco use disorder 06/27/2019  . Hyperlipidemia due to type 1 diabetes mellitus ([place]) 06/27/2019  . HTN (hypertension)   . History of CVA (cerebrovascular accident) 06/13/2019  . Diabetes mellitus type I ([place])   . CKD (chronic kidney disease), stage III     Past Surgical History:  Procedure Laterality Date  . BUBBLE STUDY  06/20/2019   Procedure: BUBBLE STUDY;  Surgeon: Fay Records, MD;  Location: [place];  Service: Cardiovascular;;  . TEE WITHOUT CARDIOVERSION N/A 06/20/2019   Procedure: TRANSESOPHAGEAL ECHOCARDIOGRAM (TEE);  Surgeon: Fay Records, MD;  Location: [facility] ENDOSCOPY;  Service: Cardiovascular;  Laterality: N/A;       Family History  Problem Relation Age of Onset  . Hypertension Mother   . Diabetes Mother   . Hyperlipidemia Mother    . Diabetes Father     Social History   Tobacco Use  . Smoking status: Current Every Day Smoker    Packs/day: 0.25    Types: Cigarettes    Start date: 12/29/2010  . Smokeless tobacco: Never Used  Substance Use Topics  . Alcohol use: Not Currently    Comment: 6 beers/week  . Drug use: Not Currently    Home Medications Prior to Admission medications   Medication Sig Start Date End Date Taking? Authorizing Provider  acetaminophen (TYLENOL) 325 MG tablet Take 650 mg by mouth every 6 (six) hours as needed for headache.   Yes [provider]  amLODipine (NORVASC) 10 MG tablet Take 1 tablet (10 mg total) by mouth daily. 10/07/19  Yes Fulp, Cammie, MD  carvedilol (COREG) 25 MG tablet Take 1 tablet (25 mg total) by mouth 2 (two) times daily with a meal. 12/19/19 06/16/20 Yes Elsie Stain, MD  cloNIDine (CATAPRES) 0.2 MG tablet Take 1 tablet (0.2 mg total) by mouth 2 (two) times daily. 10/07/19  Yes Fulp, Cammie, MD  clopidogrel (PLAVIX) 75 MG tablet Take 1 tablet (75 mg total) by mouth daily. 12/19/19 03/18/20 Yes Elsie Stain, MD  ferrous sulfate 325 (65 FE) MG tablet Take 1 tablet (325 mg total) by mouth daily with breakfast. 10/07/19  Yes Fulp, Cammie, MD  hydrALAZINE (APRESOLINE) 10  MG tablet Take 1 tablet (10 mg total) by mouth every 8 (eight) hours. 12/19/19 03/18/20 Yes Elsie Stain, MD  insulin NPH Human (NOVOLIN N) 100 UNIT/ML injection Inject 0.08 mLs (8 Units total) into the skin 2 (two) times daily with a meal. Take 8 in the am and 12 at night Patient taking differently: Inject 8-12 Units into the skin See admin instructions. Take 8 in the am and 12 at night 12/19/19  Yes Elsie Stain, MD  insulin regular (NOVOLIN R) 100 units/mL injection Inject 0.04 mLs (4 Units total) into the skin See admin instructions. Sliding Scale: 4 units three times daily before meals, then increase by 1 unit at each dose for glucose greater than 180. 12/19/19  Yes Elsie Stain, MD   pantoprazole (PROTONIX) 40 MG tablet Take 1 tablet (40 mg total) by mouth daily. For stomach protection 10/07/19  Yes Fulp, Cammie, MD  rosuvastatin (CRESTOR) 20 MG tablet Take 1 tablet (20 mg total) by mouth daily. 12/19/19  Yes Elsie Stain, MD  glucose blood (ONETOUCH VERIO) test strip Use as instructed to check blood sugar 3 times daily. 12/21/19   Elsie Stain, MD    Allergies    Patient has no known allergies.  Review of Systems   Review of Systems  Gastrointestinal: Positive for nausea and vomiting.  All other systems reviewed and are negative.   Physical Exam Updated Vital Signs BP (!) 179/109   Pulse (!) 123   Temp 98.3 F (36.8 C) (Oral)   Resp 20   SpO2 100%   Physical Exam Vitals and nursing note reviewed. Exam conducted with a chaperone present.  Constitutional:      General: He is in acute distress.     Appearance: He is ill-appearing.  HENT:     Head: Normocephalic and atraumatic.     Right Ear: External ear normal.     Left Ear: External ear normal.     Nose: Nose normal.     Mouth/Throat:     Mouth: Mucous membranes are dry.     Pharynx: Oropharynx is clear.  Eyes:     Extraocular Movements: Extraocular movements intact.     Conjunctiva/sclera: Conjunctivae normal.     Pupils: Pupils are equal, round, and reactive to light.  Cardiovascular:     Rate and Rhythm: Regular rhythm. Tachycardia present.     Pulses: Normal pulses.     Heart sounds: Normal heart sounds.  Pulmonary:     Effort: Pulmonary effort is normal.     Breath sounds: Normal breath sounds.  Abdominal:     General: Abdomen is flat. Bowel sounds are normal.     Tenderness: There is abdominal tenderness in the epigastric area.  Genitourinary:    Rectum: Guaiac result negative.  Musculoskeletal:        General: Normal range of motion.     Cervical back: Normal range of motion and neck supple.  Skin:    General: Skin is warm.     Capillary Refill: Capillary refill takes  less than 2 seconds.  Neurological:     General: No focal deficit present.     Mental Status: He is alert and oriented to person, place, and time.  Psychiatric:        Mood and Affect: Mood normal.        Behavior: Behavior normal.        Thought Content: Thought content normal.        Judgment:  Judgment normal.     ED Results / Procedures / Treatments   Labs (all labs ordered are listed, but only abnormal results are displayed) Labs Reviewed  COMPREHENSIVE METABOLIC PANEL - Abnormal; Notable for the following components:      Result Value   Chloride 95 (*)    CO2 17 (*)    Glucose, Bld 299 (*)    BUN 45 (*)    Creatinine, Ser 4.24 (*)    GFR calc non Af Amer 17 (*)    GFR calc Af Amer 20 (*)    Anion gap 24 (*)    All other components within normal limits  CBC - Abnormal; Notable for the following components:   WBC 24.4 (*)    All other components within normal limits  CBG MONITORING, ED - Abnormal; Notable for the following components:   Glucose-Capillary 301 (*)    All other components within normal limits  POCT I-STAT EG7 - Abnormal; Notable for the following components:   pCO2, Ven 29.0 (*)    pO2, Ven 80.0 (*)    Bicarbonate 18.7 (*)    TCO2 20 (*)    Acid-base deficit 5.0 (*)    Calcium, Ion 1.03 (*)    HCT 37.0 (*)    Hemoglobin 12.6 (*)    All other components within normal limits  RESPIRATORY PANEL BY RT PCR (FLU A&B, COVID)  BETA-HYDROXYBUTYRIC ACID  POC OCCULT BLOOD, ED  I-STAT VENOUS BLOOD GAS, ED  TYPE AND SCREEN  ABO/RH    EKG EKG Interpretation  Date/Time:  Wednesday January 11 2020 09:09:45 EST Ventricular Rate:  155 PR Interval:  112 QRS Duration: 88 QT Interval:  314 QTC Calculation: 504 R Axis:   145 Text Interpretation: Sinus tachycardia Left posterior fascicular block Cannot rule out Anterior infarct , age undetermined Abnormal ECG Since last tracing rate faster Confirmed by Isla Pence [phone]) on 01/11/2020 9:55:19 AM    Radiology No results found.  Procedures Procedures (including critical care time)  Medications Ordered in ED Medications  pantoprazole (PROTONIX) 80 mg in sodium chloride 0.9 % 250 mL (0.32 mg/mL) infusion (8 mg/hr Intravenous New Bag/Given 01/11/20 1128)  promethazine (PHENERGAN) injection 25 mg (25 mg Intravenous Given 01/11/20 1013)  pantoprazole (PROTONIX) 80 mg in sodium chloride 0.9 % 100 mL IVPB (0 mg Intravenous Stopped 01/11/20 1150)  sodium chloride 0.9 % bolus 1,000 mL (0 mLs Intravenous Stopped 01/11/20 1127)  sodium chloride 0.9 % bolus 1,000 mL (1,000 mLs Intravenous New Bag/Given 01/11/20 1155)  insulin aspart (novoLOG) injection 6 Units (6 Units Intravenous Given 01/11/20 1215)    ED Course  I have reviewed the triage vital signs and the nursing notes.  Pertinent labs & imaging results that were available during my care of the patient were reviewed by me and considered in my medical decision making (see chart for details).    MDM Rules/Calculators/A&P                      Anion gap is elevated, but pH is 7.4.  pC02 is slightly low, so he may be in very mild DKA.  Pt given IVFs and IV insulin, but not started on a drip.  BHB pending.  BHB is elevated, so I started the insulin drip.  Pt is still slightly nauseous, but is improving.  HR and BP are trending down.  WBC is elevated, and pt has some mild abd tenderness, so a CT abd/pelvis added on.  Pt's  h/h are normal and stool guaiac is negative, so I suspect a mallory-weiss tear from vomiting.  Pt does have acute on chronic kidney disease.    Covid test negative.  Pt d/w FP for admission.  CRITICAL CARE Performed by: Isla Pence   Total critical care time: 30 minutes  Critical care time was exclusive of separately billable procedures and treating other patients.  Critical care was necessary to treat or prevent imminent or life-threatening deterioration.  Critical care was time spent personally by me on the  following activities: development of treatment plan with patient and/or surrogate as well as nursing, discussions with consultants, evaluation of patient's response to treatment, examination of patient, obtaining history from patient or surrogate, ordering and performing treatments and interventions, ordering and review of laboratory studies, ordering and review of radiographic studies, pulse oximetry and re-evaluation of patient's condition.   Final Clinical Impression(s) / ED Diagnoses Final diagnoses:  Poorly controlled type 1 diabetes mellitus ([place])  Hematemesis with nausea  Acute renal failure superimposed on stage 4 chronic kidney disease, unspecified acute renal failure type [facility])    Rx / DC Orders ED Discharge Orders    None       Isla Pence, MD 01/11/20 1317

## 2020-01-11 NOTE — ED Notes (Signed)
Went in room to check on pt and he had been up , pulled out his IV  And taken himself off monitor and placed  Wash cloth I had given him  Beside sink , pt denies ANY knowledge if doing this, , nothing was on floor

## 2020-01-11 NOTE — ED Notes (Signed)
Spoke with admitting MD about pt's CBG. Verbal order to try to start insulin drip at this time. Gave pt 2mg  ativan as he was agitated and confused/diaphoretic.

## 2020-01-11 NOTE — ED Notes (Signed)
Pt transported to 3W13C by this RN via cart. Pt conscious, breathing, and A&Ox4. No distress noted. Pt care endorsed to [facility]. All belongings with pt. IVF and medications infusing per MAR.

## 2020-01-11 NOTE — ED Triage Notes (Addendum)
Pt arrives via [facility] from home for c/o coffee ground emesis for the past 2 days, hypertensive upon ems arrival. Pt given 8mg  zofran en route, pt inducing vomiting in triage. Denies cough, sob, fevers or chills. Has extensive medical history. EMS VS: 180/110, 130HR, 99% on RA, 313 CBG, 380cc NS. Not on blood thinners currently. A/ox4.

## 2020-01-11 NOTE — ED Notes (Signed)
Pt sleeping at this time.

## 2020-01-12 ENCOUNTER — Inpatient Hospital Stay (HOSPITAL_COMMUNITY): Payer: Medicaid Other

## 2020-01-12 DIAGNOSIS — N184 Chronic kidney disease, stage 4 (severe): Secondary | ICD-10-CM

## 2020-01-12 DIAGNOSIS — R509 Fever, unspecified: Secondary | ICD-10-CM

## 2020-01-12 DIAGNOSIS — K920 Hematemesis: Secondary | ICD-10-CM

## 2020-01-12 DIAGNOSIS — E782 Mixed hyperlipidemia: Secondary | ICD-10-CM

## 2020-01-12 DIAGNOSIS — N179 Acute kidney failure, unspecified: Secondary | ICD-10-CM

## 2020-01-12 DIAGNOSIS — Z8673 Personal history of transient ischemic attack (TIA), and cerebral infarction without residual deficits: Secondary | ICD-10-CM

## 2020-01-12 DIAGNOSIS — E1065 Type 1 diabetes mellitus with hyperglycemia: Secondary | ICD-10-CM

## 2020-01-12 DIAGNOSIS — I340 Nonrheumatic mitral (valve) insufficiency: Secondary | ICD-10-CM

## 2020-01-12 DIAGNOSIS — I10 Essential (primary) hypertension: Secondary | ICD-10-CM

## 2020-01-12 DIAGNOSIS — E1010 Type 1 diabetes mellitus with ketoacidosis without coma: Secondary | ICD-10-CM

## 2020-01-12 LAB — BASIC METABOLIC PANEL
Anion gap: 10 (ref 5–15)
Anion gap: 13 (ref 5–15)
Anion gap: 9 (ref 5–15)
Anion gap: 11 (ref 5–15)
Anion gap: 6 (ref 5–15)
BUN: 39 mg/dL — ABNORMAL HIGH (ref 6–20)
BUN: 37 mg/dL — ABNORMAL HIGH (ref 6–20)
BUN: 33 mg/dL — ABNORMAL HIGH (ref 6–20)
BUN: 32 mg/dL — ABNORMAL HIGH (ref 6–20)
BUN: 27 mg/dL — ABNORMAL HIGH (ref 6–20)
CO2: 23 mmol/L (ref 22–32)
CO2: 21 mmol/L — ABNORMAL LOW (ref 22–32)
CO2: 20 mmol/L — ABNORMAL LOW (ref 22–32)
CO2: 20 mmol/L — ABNORMAL LOW (ref 22–32)
CO2: 23 mmol/L (ref 22–32)
Calcium: 8.2 mg/dL — ABNORMAL LOW (ref 8.9–10.3)
Calcium: 8.8 mg/dL — ABNORMAL LOW (ref 8.9–10.3)
Calcium: 8.4 mg/dL — ABNORMAL LOW (ref 8.9–10.3)
Calcium: 8.2 mg/dL — ABNORMAL LOW (ref 8.9–10.3)
Calcium: 8.1 mg/dL — ABNORMAL LOW (ref 8.9–10.3)
Chloride: 108 mmol/L (ref 98–111)
Chloride: 108 mmol/L (ref 98–111)
Chloride: 109 mmol/L (ref 98–111)
Chloride: 109 mmol/L (ref 98–111)
Chloride: 112 mmol/L — ABNORMAL HIGH (ref 98–111)
Creatinine, Ser: 3.57 mg/dL — ABNORMAL HIGH (ref 0.61–1.24)
Creatinine, Ser: 3.5 mg/dL — ABNORMAL HIGH (ref 0.61–1.24)
Creatinine, Ser: 3.24 mg/dL — ABNORMAL HIGH (ref 0.61–1.24)
Creatinine, Ser: 3.19 mg/dL — ABNORMAL HIGH (ref 0.61–1.24)
Creatinine, Ser: 3.03 mg/dL — ABNORMAL HIGH (ref 0.61–1.24)
GFR calc Af Amer: 24 mL/min — ABNORMAL LOW (ref >60)
GFR calc Af Amer: 25 mL/min — ABNORMAL LOW (ref >60)
GFR calc Af Amer: 28 mL/min — ABNORMAL LOW (ref >60)
GFR calc Af Amer: 28 mL/min — ABNORMAL LOW (ref >60)
GFR calc Af Amer: 30 mL/min — ABNORMAL LOW (ref >60)
GFR calc non Af Amer: 21 mL/min — ABNORMAL LOW (ref >60)
GFR calc non Af Amer: 22 mL/min — ABNORMAL LOW (ref >60)
GFR calc non Af Amer: 24 mL/min — ABNORMAL LOW (ref >60)
GFR calc non Af Amer: 24 mL/min — ABNORMAL LOW (ref >60)
GFR calc non Af Amer: 26 mL/min — ABNORMAL LOW (ref >60)
Glucose, Bld: 146 mg/dL — ABNORMAL HIGH (ref 70–99)
Glucose, Bld: 159 mg/dL — ABNORMAL HIGH (ref 70–99)
Glucose, Bld: 319 mg/dL — ABNORMAL HIGH (ref 70–99)
Glucose, Bld: 206 mg/dL — ABNORMAL HIGH (ref 70–99)
Glucose, Bld: 103 mg/dL — ABNORMAL HIGH (ref 70–99)
Potassium: 3.9 mmol/L (ref 3.5–5.1)
Potassium: 4.1 mmol/L (ref 3.5–5.1)
Potassium: 4.2 mmol/L (ref 3.5–5.1)
Potassium: 4 mmol/L (ref 3.5–5.1)
Potassium: 4 mmol/L (ref 3.5–5.1)
Sodium: 141 mmol/L (ref 135–145)
Sodium: 142 mmol/L (ref 135–145)
Sodium: 138 mmol/L (ref 135–145)
Sodium: 140 mmol/L (ref 135–145)
Sodium: 141 mmol/L (ref 135–145)

## 2020-01-12 LAB — GLUCOSE, CAPILLARY
Glucose-Capillary: 116 mg/dL — ABNORMAL HIGH (ref 70–99)
Glucose-Capillary: 125 mg/dL — ABNORMAL HIGH (ref 70–99)
Glucose-Capillary: 128 mg/dL — ABNORMAL HIGH (ref 70–99)
Glucose-Capillary: 129 mg/dL — ABNORMAL HIGH (ref 70–99)
Glucose-Capillary: 132 mg/dL — ABNORMAL HIGH (ref 70–99)
Glucose-Capillary: 145 mg/dL — ABNORMAL HIGH (ref 70–99)
Glucose-Capillary: 149 mg/dL — ABNORMAL HIGH (ref 70–99)
Glucose-Capillary: 151 mg/dL — ABNORMAL HIGH (ref 70–99)
Glucose-Capillary: 154 mg/dL — ABNORMAL HIGH (ref 70–99)
Glucose-Capillary: 154 mg/dL — ABNORMAL HIGH (ref 70–99)
Glucose-Capillary: 156 mg/dL — ABNORMAL HIGH (ref 70–99)
Glucose-Capillary: 160 mg/dL — ABNORMAL HIGH (ref 70–99)
Glucose-Capillary: 184 mg/dL — ABNORMAL HIGH (ref 70–99)
Glucose-Capillary: 187 mg/dL — ABNORMAL HIGH (ref 70–99)
Glucose-Capillary: 206 mg/dL — ABNORMAL HIGH (ref 70–99)
Glucose-Capillary: 210 mg/dL — ABNORMAL HIGH (ref 70–99)
Glucose-Capillary: 223 mg/dL — ABNORMAL HIGH (ref 70–99)
Glucose-Capillary: 249 mg/dL — ABNORMAL HIGH (ref 70–99)
Glucose-Capillary: 252 mg/dL — ABNORMAL HIGH (ref 70–99)
Glucose-Capillary: 272 mg/dL — ABNORMAL HIGH (ref 70–99)
Glucose-Capillary: 297 mg/dL — ABNORMAL HIGH (ref 70–99)
Glucose-Capillary: 59 mg/dL — ABNORMAL LOW (ref 70–99)
Glucose-Capillary: 65 mg/dL — ABNORMAL LOW (ref 70–99)

## 2020-01-12 LAB — LIPID PANEL
Cholesterol: 128 mg/dL (ref 0–200)
HDL: 25 mg/dL — ABNORMAL LOW (ref >40)
LDL Cholesterol: 60 mg/dL (ref 0–99)
Total CHOL/HDL Ratio: 5.1 RATIO
Triglycerides: 215 mg/dL — ABNORMAL HIGH (ref <150)
VLDL: 43 mg/dL — ABNORMAL HIGH (ref 0–40)

## 2020-01-12 LAB — ECHOCARDIOGRAM COMPLETE
Height: 63 in
Weight: 2000 oz

## 2020-01-12 LAB — VITAMIN B12: Vitamin B-12: 786 pg/mL (ref 180–914)

## 2020-01-12 LAB — TSH: TSH: 0.824 u[IU]/mL (ref 0.350–4.500)

## 2020-01-12 LAB — LACTIC ACID, PLASMA: Lactic Acid, Venous: 0.7 mmol/L (ref 0.5–1.9)

## 2020-01-12 LAB — BETA-HYDROXYBUTYRIC ACID
Beta-Hydroxybutyric Acid: 1.9 mmol/L — ABNORMAL HIGH (ref 0.05–0.27)
Beta-Hydroxybutyric Acid: 0.49 mmol/L — ABNORMAL HIGH (ref 0.05–0.27)

## 2020-01-12 MED ORDER — SODIUM CHLORIDE 0.9 % IV SOLN: INTRAVENOUS | Status: DC

## 2020-01-12 MED ORDER — DEXTROSE 50 % IV SOLN: 0.0000 mL | INTRAVENOUS | Status: DC | PRN

## 2020-01-12 MED ORDER — LORAZEPAM 2 MG/ML IJ SOLN
1.0000 mg | Freq: Once | INTRAMUSCULAR | Status: AC
  Administered 2020-01-13: 1 mg via INTRAVENOUS
  Filled 2020-01-12: qty 1

## 2020-01-12 MED ORDER — PANTOPRAZOLE SODIUM 40 MG IV SOLR
40.0000 mg | Freq: Two times a day (BID) | INTRAVENOUS | Status: DC
  Administered 2020-01-12: 40 mg via INTRAVENOUS
  Filled 2020-01-12: qty 40

## 2020-01-12 MED ORDER — INSULIN ASPART 100 UNIT/ML ~~LOC~~ SOLN: 0.0000 [IU] | Freq: Every day | SUBCUTANEOUS | Status: DC

## 2020-01-12 MED ORDER — ASPIRIN 300 MG RE SUPP: 300.0000 mg | Freq: Every day | RECTAL | Status: DC

## 2020-01-12 MED ORDER — POTASSIUM CHLORIDE 10 MEQ/100ML IV SOLN
10.0000 meq | INTRAVENOUS | Status: AC
  Administered 2020-01-12 (×3): 10 meq via INTRAVENOUS
  Filled 2020-01-12 (×3): qty 100

## 2020-01-12 MED ORDER — METOPROLOL TARTRATE 5 MG/5ML IV SOLN
2.5000 mg | Freq: Four times a day (QID) | INTRAVENOUS | Status: DC
  Administered 2020-01-12: 2.5 mg via INTRAVENOUS
  Filled 2020-01-12 (×3): qty 5

## 2020-01-12 MED ORDER — INSULIN ASPART 100 UNIT/ML ~~LOC~~ SOLN
0.0000 [IU] | Freq: Three times a day (TID) | SUBCUTANEOUS | Status: DC
  Administered 2020-01-13: 5 [IU] via SUBCUTANEOUS
  Administered 2020-01-13: 3 [IU] via SUBCUTANEOUS

## 2020-01-12 MED ORDER — ENOXAPARIN SODIUM 30 MG/0.3ML ~~LOC~~ SOLN
30.0000 mg | SUBCUTANEOUS | Status: DC
  Administered 2020-01-12: 30 mg via SUBCUTANEOUS
  Filled 2020-01-12 (×2): qty 0.3

## 2020-01-12 MED ORDER — ASPIRIN EC 325 MG PO TBEC
325.0000 mg | DELAYED_RELEASE_TABLET | Freq: Every day | ORAL | Status: DC
  Filled 2020-01-12: qty 1

## 2020-01-12 MED ORDER — INSULIN GLARGINE 100 UNIT/ML ~~LOC~~ SOLN
10.0000 [IU] | SUBCUTANEOUS | Status: DC
  Filled 2020-01-12: qty 0.1

## 2020-01-12 MED ORDER — ASPIRIN EC 81 MG PO TBEC
81.0000 mg | DELAYED_RELEASE_TABLET | Freq: Every day | ORAL | Status: DC
  Administered 2020-01-12 – 2020-01-13 (×2): 81 mg via ORAL
  Filled 2020-01-12 (×2): qty 1

## 2020-01-12 MED ORDER — ROSUVASTATIN CALCIUM 20 MG PO TABS
20.0000 mg | ORAL_TABLET | Freq: Every day | ORAL | Status: DC
  Administered 2020-01-12 – 2020-01-13 (×2): 20 mg via ORAL
  Filled 2020-01-12 (×3): qty 1

## 2020-01-12 MED ORDER — ACETAMINOPHEN 325 MG PO TABS: 650.0000 mg | ORAL_TABLET | Freq: Once | ORAL | Status: DC

## 2020-01-12 MED ORDER — CARVEDILOL 12.5 MG PO TABS
12.5000 mg | ORAL_TABLET | Freq: Two times a day (BID) | ORAL | Status: DC
  Administered 2020-01-12 – 2020-01-13 (×2): 12.5 mg via ORAL
  Filled 2020-01-12 (×2): qty 1

## 2020-01-12 MED ORDER — INSULIN GLARGINE 100 UNIT/ML ~~LOC~~ SOLN
15.0000 [IU] | SUBCUTANEOUS | Status: DC
  Filled 2020-01-12: qty 0.15

## 2020-01-12 MED ORDER — SODIUM CHLORIDE 0.9 % IV BOLUS
1000.0000 mL | Freq: Once | INTRAVENOUS | Status: AC
  Administered 2020-01-12: 1000 mL via INTRAVENOUS

## 2020-01-12 MED ORDER — INSULIN GLARGINE 100 UNIT/ML ~~LOC~~ SOLN
12.0000 [IU] | SUBCUTANEOUS | Status: DC
  Administered 2020-01-12: 12 [IU] via SUBCUTANEOUS
  Filled 2020-01-12 (×2): qty 0.12

## 2020-01-12 MED ORDER — PANTOPRAZOLE SODIUM 40 MG PO TBEC
40.0000 mg | DELAYED_RELEASE_TABLET | Freq: Every day | ORAL | Status: DC
  Administered 2020-01-13: 10:00:00 40 mg via ORAL
  Filled 2020-01-12: qty 1

## 2020-01-12 MED ORDER — DEXTROSE-NACL 5-0.45 % IV SOLN: INTRAVENOUS | Status: DC

## 2020-01-12 MED ORDER — INSULIN REGULAR(HUMAN) IN NACL 100-0.9 UT/100ML-% IV SOLN
INTRAVENOUS | Status: DC
  Administered 2020-01-12: 5 [IU]/h via INTRAVENOUS
  Filled 2020-01-12: qty 100

## 2020-01-12 MED ORDER — CLOPIDOGREL BISULFATE 75 MG PO TABS
75.0000 mg | ORAL_TABLET | Freq: Every day | ORAL | Status: DC
  Administered 2020-01-12 – 2020-01-13 (×2): 75 mg via ORAL
  Filled 2020-01-12 (×3): qty 1

## 2020-01-12 MED ORDER — POTASSIUM CHLORIDE 10 MEQ/100ML IV SOLN
10.0000 meq | INTRAVENOUS | Status: AC
  Administered 2020-01-12 (×2): 10 meq via INTRAVENOUS
  Filled 2020-01-12 (×2): qty 100

## 2020-01-12 NOTE — Progress Notes (Signed)
Hypoglycemic Event  CBG: 59  Treatment: D50 25 mL (12.5 gm)  Symptoms: None  Follow-up CBG: [id number] CBG Result:145  Possible Reasons for Event: Pt is on iv Insulin with Endo Tool  Comments/MD notified:Dr Vanessa Barbara, Shellyann Wandrey Efe

## 2020-01-12 NOTE — Evaluation (Signed)
Physical Therapy Evaluation & Discharge Patient Details Name: George [name] MRN: [id number] DOB: 04/21/1986 Today's Date: 01/12/2020   History of Present Illness  Pt is a 34 y.o. male admitted 01/11/20 with hematemesis and AMS. Worked up for DKA. MRI with acute to subacute R cerebellar infarct, subacute infarct R frontal cortex; old L PCA and L cerebellar infarcts. PMH includes DM1, HTN, CKD 3, recent CVAs (05/2019, 08/2019).    Clinical Impression  Patient evaluated by Physical Therapy with no further acute PT needs identified. PTA, pt independent with mobility. Today, pt independent with mobility and ADL tasks. Dynamic Gait Index score of 23/24 does not indicate risk for falls with higher level balance tasks. Permissible HTN noted post-ambulation. All education has been completed and the patient has no further questions. Mother present and beginning of session and supportive. Acute PT is signing off. Thank you for this referral.    Follow Up Recommendations No PT follow up    Equipment Recommendations  None recommended by PT    Recommendations for Other Services       Precautions / Restrictions Precautions Precautions: Other (comment) Precaution Comments: Hypertension (permissable up to 188/110) Restrictions Weight Bearing Restrictions: No      Mobility  Bed Mobility Overal bed mobility: Independent                Transfers Overall transfer level: Independent                  Ambulation/Gait Ambulation/Gait assistance: Independent Gait Distance (Feet): 600 Feet Assistive device: None Gait Pattern/deviations: WFL(Within Functional Limits)   Gait velocity interpretation: >2.62 ft/sec, indicative of community ambulatory    Stairs            Wheelchair Mobility    Modified Rankin (Stroke Patients Only) Modified Rankin (Stroke Patients Only) Pre-Morbid Rankin Score: No symptoms Modified Rankin: No symptoms     Balance Overall balance  assessment: Independent                               Standardized Balance Assessment Standardized Balance Assessment : Dynamic Gait Index   Dynamic Gait Index Level Surface: Normal Change in Gait Speed: Normal Gait with Horizontal Head Turns: Normal Gait with Vertical Head Turns: Normal Gait and Pivot Turn: Normal Step Over Obstacle: Mild Impairment Step Around Obstacles: Normal Steps: Normal Total Score: 23       Pertinent Vitals/Pain Pain Assessment: No/denies pain    Home Living Family/patient expects to be discharged to:: Private residence Living Arrangements: Parent Available Help at Discharge: Family;Available PRN/intermittently Type of Home: House Home Access: Stairs to enter Entrance Stairs-Rails: None Entrance Stairs-Number of Steps: 2 Home Layout: One level Home Equipment: Cane - single point Additional Comments: Mom works as Quarry manager Level of Independence: Independent         Comments: Does not work, primarily staying at home due to [employer] and health concerns     Journalist, newspaper        Extremity/Trunk Assessment   Upper Extremity Assessment Upper Extremity Assessment: Overall WFL for tasks assessed(no dysmetria or dysdiadochokinesia)    Lower Extremity Assessment Lower Extremity Assessment: Overall WFL for tasks assessed(no dysmetria or dysdiadochokinesia; hip flexors 4-4+/5, knee and ankle 5/5)    Cervical / Trunk Assessment Cervical / Trunk Assessment: Normal  Communication   Communication: No difficulties  Cognition Arousal/Alertness: Awake/alert Behavior During Therapy: WFL for tasks assessed/performed Overall Cognitive  Status: Within Functional Limits for tasks assessed                                        General Comments      Exercises     Assessment/Plan    PT Assessment Patent does not need any further PT services  PT Problem List         PT Treatment Interventions       PT Goals (Current goals can be found in the Care Plan section)  Acute Rehab PT Goals PT Goal Formulation: All assessment and education complete, DC therapy    Frequency     Barriers to discharge        Co-evaluation               AM-PAC PT "6 Clicks" Mobility  Outcome Measure Help needed turning from your back to your side while in a flat bed without using bedrails?: None Help needed moving from lying on your back to sitting on the side of a flat bed without using bedrails?: None Help needed moving to and from a bed to a chair (including a wheelchair)?: None Help needed standing up from a chair using your arms (e.g., wheelchair or bedside chair)?: None Help needed to walk in hospital room?: None Help needed climbing 3-5 steps with a railing? : None 6 Click Score: 24    End of Session   Activity Tolerance: Patient tolerated treatment well Patient left: in bed;with call bell/phone within reach Nurse Communication: Mobility status PT Visit Diagnosis: Other abnormalities of gait and mobility (R26.89)    Time: [id number] PT Time Calculation (min) (ACUTE ONLY): 22 min   Charges:   PT Evaluation $PT Eval Low Complexity: 1 Low     Mabeline Caras, PT, DPT Acute Rehabilitation Services  Pager [phone] Office [place] 01/12/2020, 4:25 PM

## 2020-01-12 NOTE — Progress Notes (Signed)
STROKE TEAM PROGRESS NOTE   INTERVAL HISTORY Pt sitting in bed, having hiccup. Otherwise, neuro intact. He is still on insulin drip.   Vitals:   01/12/20 0006 01/12/20 0418 01/12/20 0909 01/12/20 1105  BP: (!) 174/107 (!) 159/94 (!) 182/108 (!) 184/113  Pulse: (!) 119  (!) 115   Resp: 19 18 18    Temp: 100 F (37.8 C) 98.6 F (37 C) 98.4 F (36.9 C) (!) 100.5 F (38.1 C)  TempSrc: Oral Oral Oral Oral  SpO2: 99% 98% 100%   Weight:      Height:        CBC:  Recent Labs  Lab 01/11/20 0918 01/11/20 0918 01/11/20 1139 01/11/20 1800  WBC 24.4*  --   --  25.4*  NEUTROABS  --   --   --  23.4*  HGB 13.9   < > 12.6* 11.8*  HCT 40.4   < > 37.0* 34.5*  MCV 87.6  --   --  88.9  PLT 312  --   --  203   < > = values in this interval not displayed.    Basic Metabolic Panel:  Recent Labs  Lab 01/12/20 0546 01/12/20 1150  NA 142 138  K 4.1 4.2  CL 108 109  CO2 21* 20*  GLUCOSE 159* 319*  BUN 37* 33*  CREATININE 3.50* 3.24*  CALCIUM 8.8* 8.4*   Lipid Panel:     Component Value Date/Time   CHOL 128 01/12/2020 0546   CHOL 156 06/27/2019 1033   TRIG 215 (H) 01/12/2020 0546   HDL 25 (L) 01/12/2020 0546   HDL 76 06/27/2019 1033   CHOLHDL 5.1 01/12/2020 0546   VLDL 43 (H) 01/12/2020 0546   LDLCALC 60 01/12/2020 0546   LDLCALC 71 06/27/2019 1033   HgbA1c:  Lab Results  Component Value Date   HGBA1C 7.2 (H) 01/11/2020   Urine Drug Screen:     Component Value Date/Time   LABOPIA NONE DETECTED 01/11/2020 1342   COCAINSCRNUR NONE DETECTED 01/11/2020 1342   LABBENZ NONE DETECTED 01/11/2020 1342   AMPHETMU NONE DETECTED 01/11/2020 1342   THCU POSITIVE (A) 01/11/2020 1342   LABBARB NONE DETECTED 01/11/2020 1342    Alcohol Level     Component Value Date/Time   ETH <10 01/11/2020 1844    IMAGING past 48 hours CT ABDOMEN PELVIS WO CONTRAST  Result Date: 01/11/2020 CLINICAL DATA:  Pt arrives via [facility] from home for c/o coffee ground emesis for the past 2 days,  hypertensive upon ems arrival. Pt given 8mg  zofran en route, pt inducing vomiting in triage. Denies cough, sob, fevers or chills. Has extensive medical history. EMS VS: 180/110, 130HR, 99% on RA, 313 CBG, 380cc NS. Not on blood thinners currently. A/ox4. EXAM: CT ABDOMEN AND PELVIS WITHOUT CONTRAST TECHNIQUE: Multidetector CT imaging of the abdomen and pelvis was performed following the standard protocol without IV contrast. COMPARISON:  09/23/2019 FINDINGS: Lower chest: No acute abnormality. Hepatobiliary: No focal liver abnormality is seen. No gallstones, gallbladder wall thickening, or biliary dilatation. Pancreas: Unremarkable. No pancreatic ductal dilatation or surrounding inflammatory changes. Spleen: Normal in size without focal abnormality. Adrenals/Urinary Tract: Adrenal glands are unremarkable. Kidneys are normal, without renal calculi, focal lesion, or hydronephrosis. Bladder is unremarkable. Stomach/Bowel: Stomach is unremarkable. Small bowel and colon are normal in caliber. No wall thickening. No inflammation. No evidence appendicitis. Vascular/Lymphatic: No significant vascular findings are present. No enlarged abdominal or pelvic lymph nodes. Reproductive: Unremarkable. Other: No abdominal wall hernia or abnormality.  No abdominopelvic ascites. Musculoskeletal: No acute or significant osseous findings. IMPRESSION: 1. No acute findings. Exam within normal limits. No findings to account for coffee ground emesis. No evidence of bowel obstruction or inflammation. Electronically Signed   By: Lajean Manes M.D.   On: 01/11/2020 15:27   CT HEAD WO CONTRAST  Result Date: 01/11/2020 CLINICAL DATA:  Triage note: Pt arrives via [facility] from home for c/o coffee ground emesis for the past 2 days, hypertensive upon ems arrival. Pt given 8mg  zofran en route, pt inducing vomiting in triage. Denies cough, sob, fevers or chills. Has extensive medical history. EMS VS: 180/110, 130HR, 99% on RA, 313 CBG, 380cc NS. Not  on blood thinners currently. A/ox4 EXAM: CT HEAD WITHOUT CONTRAST TECHNIQUE: Contiguous axial images were obtained from the base of the skull through the vertex without intravenous contrast. COMPARISON:  09/23/2019 FINDINGS: Brain: In the right inferior cerebellum, there is an oval area of hypoattenuation, relatively well-defined, which involves the corticomedullary junction, measuring 2 cm in greatest dimension, representing a change from prior exam. This is consistent a subacute infarct. Well-defined encephalomalacia is noted in the left posterior temporal adjacent occipital lobes reflecting an old left PCA distribution infarct, stable from prior exam Ventricles normal in size and configuration. There are no parenchymal masses or mass effect. No other evidence of infarction. There are no extra-axial masses or abnormal fluid collections. There is no intracranial hemorrhage. Vascular: No hyperdense vessel or unexpected calcification. Skull: Normal. Negative for fracture or focal lesion. Sinuses/Orbits: Normal globes and orbits. Visualized sinuses and mastoid air cells are clear. Other: None. IMPRESSION: 1. 2 cm area of hypoattenuation in the peripheral, inferior right cerebellum consistent with a subacute infarct, new since the prior head CT. 2. No other evidence of an acute or recent abnormality. 3. Old left PCA distribution infarction. Electronically Signed   By: Lajean Manes M.D.   On: 01/11/2020 15:39   MR BRAIN WO CONTRAST  Result Date: 01/12/2020 CLINICAL DATA:  Initial evaluation for possible subacute infarct in the right cerebellum. EXAM: MRI HEAD WITHOUT CONTRAST TECHNIQUE: Multiplanar, multiecho pulse sequences of the brain and surrounding structures were obtained without intravenous contrast. COMPARISON:  Prior CT from 01/11/2020. FINDINGS: Brain: Examination technically limited as the patient was unable to tolerate the full length of the exam. Diffusion-weighted sequences and axial T2 weighted  sequence only were performed. Cerebral volume within normal limits for age. Encephalomalacia within the left occipital lobe compatible with chronic left PCA territory infarct. Associated small remote lacunar infarct at the lateral left thalamus. Additional small remote left cerebellar infarct noted (series 10, image 4). 19 mm focus of diffusion abnormality within the peripheral right cerebellum, consistent with acute to early subacute ischemic infarct. An additional punctate 4 mm focus of diffusion abnormality seen involving the cortical gray matter of the right frontal lobe (series 5, image 89), subacute in appearance as well. No associated mass effect or definite hemorrhage on this limited exam. No other evidence for acute or subacute ischemia. No mass lesion, midline shift, or mass effect. No hydrocephalus. No extra-axial fluid collection. Pituitary gland suprasellar region normal. Midline structures intact. Vascular: Major intracranial vascular flow voids grossly maintained at the skull base. Skull and upper cervical spine: Craniocervical junction normal. Bone marrow signal intensity within normal limits. No scalp soft tissue abnormality. Sinuses/Orbits: Globes and orbital soft tissues grossly within normal limits. Paranasal sinuses are clear. No significant mastoid effusion. Other: None. IMPRESSION: 1. 19 mm acute to early subacute ischemic infarct involving the  peripheral right cerebellum. 2. Additional 4 mm subacute ischemic infarct involving the right frontal cortex as above. 3. Underlying chronic left PCA and left cerebellar infarcts. Electronically Signed   By: Jeannine Boga M.D.   On: 01/12/2020 02:57   DG CHEST PORT 1 VIEW  Result Date: 01/12/2020 CLINICAL DATA:  Fever. EXAM: PORTABLE CHEST 1 VIEW COMPARISON:  September 23, 2019. FINDINGS: The heart size and mediastinal contours are within normal limits. Both lungs are clear. The visualized skeletal structures are unremarkable. IMPRESSION: No  active disease. Electronically Signed   By: Marijo Conception M.D.   On: 01/12/2020 12:37    PHYSICAL EXAM  Temp:  [98.4 F (36.9 C)-100.5 F (38.1 C)] 99.9 F (37.7 C) (01/14 1504) Pulse Rate:  [87-127] 87 (01/14 1504) Resp:  [15-25] 25 (01/14 1504) BP: (134-184)/(88-113) 168/109 (01/14 1504) SpO2:  [98 %-100 %] 99 % (01/14 1504)  General - Well nourished, well developed, in no apparent distress, persistent hiccup.  Ophthalmologic - fundi not visualized due to noncooperation.  Cardiovascular - Regular rhythm and rate.  Mental Status -  Level of arousal and orientation to time, place, and person were intact. Language including expression, naming, repetition, comprehension was assessed and found intact. Fund of Knowledge was assessed and was intact.  Cranial Nerves II - XII - II - Visual field intact OU. III, IV, VI - Extraocular movements intact. V - Facial sensation intact bilaterally. VII - Facial movement intact bilaterally. VIII - Hearing & vestibular intact bilaterally. X - Palate elevates symmetrically. XI - Chin turning & shoulder shrug intact bilaterally. XII - Tongue protrusion intact.  Motor Strength - The patient's strength was normal in all extremities and pronator drift was absent.  Bulk was normal and fasciculations were absent.   Motor Tone - Muscle tone was assessed at the neck and appendages and was normal.  Reflexes - The patient's reflexes were symmetrical in all extremities and he had no pathological reflexes.  Sensory - Light touch, temperature/pinprick were assessed and were symmetrical.    Coordination - The patient had normal movements in the hands and feet with no ataxia or dysmetria.  Tremor was absent.  Gait and Station - deferred.   ASSESSMENT/PLAN George [name] is a 34 y.o. male with history of DKA, tyle I DB, HTN, CKD stage III presenting with hematemesis. Found to have altered mental status. CT with possible R cerebellar infarct.     Stroke:  Recurrent small R cerebellar and punctate R frontal cortical infarcts in pts w/ hx of embolic strokes in setting of multiple risk factors  CT head subacute inferior R cerebellar infarct. Old L PCA infarct   MRI  Acute to subacute peripheral R cerebellar infarct. Additional subacute infarct R frontal cortex. Old L PCA and L cerebellar infarcts.   MRA head pending  MRA neck pending  2D Echo EF 55-60%  LDL 60  HgbA1c 7.2  Lovenox 30 mg sq daily for VTE prophylaxis  clopidogrel 75 mg daily prior to admission, now on ASA 81 and plavix 75 DAPT for 3 weeks and then plavix alone.  Therapy recommendations:  pending  Disposition:  pending   Hx stroke/TIA  08/2019 - multiple bilateral infarcts right cerebellar, right parietal - embolic pattern but more likely due to synchronized small vessel diease. No residue deficits. TEE neg. Put on DAPT x 3 wks  05/2019 - Large L PCA infarct, embolic pattern, secondary to large vessel disease vs cardioembolic source. Aphasia. MRA left PCA stenosis. TEE  no POF, EF 50-55%, no DVT, hypercoagulable labs neg. LDL 102 and A1C 8.4. Put on DAPT x 3 mos and lipitor 40  10/2019 14 day Zio patch - no Afib  DKA, recurrent Uncontrolled DM  Now on insulin drip  Still hyperglycemia  A1C 7.2, goal < 7.0  SSI  CBG monitoring  Recommend close PCP follow up  Recommend endocrinology follow up and consider insulin pump  Hypertensive Urgency  BP as high as 184/113  Remains elevated . Permissive hypertension (OK if < 220/120) but gradually normalize in 3-5 days . Long-term BP goal normotensive  Hyperlipidemia  Home meds:  crestor 20  LDL 60, goal < 70  Resume statin crestor 20  Continue statin at discharge  Tobacco abuse  Current smoker  Smoking cessation counseling provided  Pt is willing to quit  Other Stroke Risk Factors  Hx ETOH use, concern for alcohol use. On CIWA protocol. sitter  Substance abuse - UDS:  THC  POSITIVE, Patient advised to stop using due to stroke risk.  Other Active Problems  N/V likely d/t mild DKA, Type I DM  Acute encephalopathy/AMS  Acute on CKD stage IV Cre 3.50  GERD  Hospital day # 1  Rosalin Hawking, MD PhD Stroke Neurology 01/12/2020 3:52 PM    To contact Stroke Continuity provider, please refer to [email or web address] After hours, contact General Neurology

## 2020-01-12 NOTE — Progress Notes (Signed)
RN reached out to MD Higinio Plan with concern that the pt is currently on a insulin drip, and the "endotool" is not being used. RN also informed MD that pt had to be given D5 for a low blood sugar of 59 (0259am) last night. RN also informed MD that pt's anion gap is currently 13. RN requested pt be taken off insuline drip. MD advised that it should be continued for time being. Will continue to monitor.

## 2020-01-12 NOTE — Progress Notes (Signed)
Family Medicine Teaching Service Daily Progress Note Intern Pager: [phone]  Patient name: George [name] Medical record number: [id number] Date of birth: November 24, 1986 Age: 34 y.o. Gender: male  Primary Care Provider: Elsie Stain, MD Consultants: Neurology Code Status: Full   Pt Overview and Major Events to Date:  01/11/20: Admitted   Assessment and Plan:  N/V likely 2/2 to mild DKA  T1DM Hypoglycemic event 59 overnight, Endotool stopped working and manual titration of insulin drip was initiated.  Patient was closed anion gap x2.  BHB increased from 0.9 to1.9 this morning.  BHB on admission was 3.7.  A1c on admission 7.2.  CBGs this morning 200-250.  Will restart Endo tool today.  Patient to have a bedside swallow study prior to eating. - Neurology consulted, appreciate recommendations - NPO - DKA protocol:  Restart Endo tool - CBG fluid titration per protocol - Monitor CBGs  - Cardiac Monitoring, pulse oximetry  - Q4H BMP  - BHB q8h  - Vitals q6h  - s/p 2x 1L NS bolus -If 1 L bolus - s/p Protonix infusion - s/p KCl IV qhr -Compazine 5mg  IV every 6 hours as needed -Diabetes educatior consult  - Follow up UA   Fever  Patient spiked a fever to 100.5-day. Leukocytosis 25.4 to be due to leukemoid reaction secondary to DKA or hemoconcentration secondary to dehydration.  Will give an additional 1 L bolus.  Will follow-up on blood cultures.  Order urine culture.   - Follow-up on lactic acid  -Follow-up on blood urine cultures. -Monitor fever curve -Consider initiating empiric antibiotics   Acute encephalopathy/AMS  subacute CVA History of CVA Answering questions appropriately today.  Patient with history of aphasia. MRI brain: 31mm acute to early subacure ischemic infarct involving the right peripheral cerebellum, additional 4 mm subacute ischemic involving the right frontal cortex, old left PCA infarcts.  We will follow-up on echo and reach out to neurology regarding  dual antiplatelet therapy.  TEE:  Follow up on ECHO and curbside Neuro regarding antiplatelet Plavix and ASA   Lipid:  Chol total 128, TGs 215, HDL 25, LDL 60  BCx: NG at 12 hr  Trops downtrended  - permissive HTN  180/110 - low dose IV Metoprolol - DAPT for 3 weeks, then Plavix alone  -Neurology consulted, appreciate recommendation             -Consider repeat TEE             -Obtain blood cultures, B12, TSH, B1 levels -Hold home medication while NPO -CIWA, call for Ativan  Hematemesis, concern for Mallory-Weiss tear: Improved. Resolved hematemesis. Reassuring Hgb. Patient with vomiting that progressed to blood-tinged emesis over the past several days, suspect small Mallory-Weiss tear due to frequent retching.  -Continue Protonix BID -Compazine IV as needed  -Monitor hemoglobin -Consider GI evaluation if clinically declines   Acute on CKDStage 4  Creatine 4.24 on admission downtrending 3.5, Likely prerenal in the setting of dehydration with mild DKA/vomiting. Creatine baseline ~3 in Sept 2020.  Patient follows with [facility].  - Consider consulting nephrology if not improving - Monitor BMP - 1 L Fluid bolus today    HTN  Tachycardic  Will give 1L bolus. BP today 182/108.BP range 134/89 - 179/109.  HR 115 today. HR range 105 - 154.Likely multifactorial holding medications, DKA , recent stroke, fever.  Home medications include 10 mg of amlodipine daily,carvedilol 25 mg twice daily,clonidine 0.2 mg twice daily,hydralazine 10 mg 3 times daily --Hold home medication  while NPO - May add IV hydralazine if BP > 180/110 mmhg, allowing permissive given CT findings/possible new CVA as discussed above -Start IV Metoprolol standing 2.5 mg q6h  Anemia Hemoglobin on admission13.9,MCV 87.6. Home medications include iron supplementation 325 mg daily. FOBT in ED was negative.  -Hold home medication while NPO - Follow up PM CBC   GERD Medications include Protonix 40 mg  daily.  Patient given IV pantoprazole in the ED. -Protonnix IV Bid    Concern for alcohol abuse Ethanol <10.  Patient given thiamine 100 mg injection.  Continue daily thiamine. Per mother, patient does not drink regularly. Will have a drink every 2-3 months.  - CIWA's for concerns of withdrawal -Thiamine 100 mg daily  - Follow up Vitamin labs   FEN/GI: NPO PPx: Lovenox  Disposition: pending clearance of acidosis   Subjective:  George [name] had hypoglycemia overnight while on the insulin drip.   Objective: Temp:  [98.4 F (36.9 C)-100 F (37.8 C)] 98.4 F (36.9 C) (01/14 0909) Pulse Rate:  [105-127] 115 (01/14 0909) Resp:  [15-22] 18 (01/14 0909) BP: (134-182)/(88-109) 182/108 (01/14 0909) SpO2:  [98 %-100 %] 100 % (01/14 0909) Weight:  [56.7 kg] 56.7 kg (01/13 1355)  Physical Exam: General: alert, male non-ill appearing in no acute distress  Cardiovascular: regular rate and rhythm, cap refill 2 sec Respiratory: clear to ascultation, no Kussmaul breathing  Abdomen: soft, non-tender, non-distended  Extremities: normal ROM, no abnormal  Neurologic: alert, oriented, strength 5/5 bilateral UE and LE, gross sensation intact   Laboratory: Recent Labs  Lab 01/11/20 0918 01/11/20 1139 01/11/20 1800  WBC 24.4*  --  25.4*  HGB 13.9 12.6* 11.8*  HCT 40.4 37.0* 34.5*  PLT 312  --  203   Recent Labs  Lab 01/11/20 0918 01/11/20 1139 01/11/20 2026 01/12/20 0127 01/12/20 0546  NA 136   < > 140 141 142  K 4.0   < > 3.9 3.9 4.1  CL 95*   < > 108 108 108  CO2 17*   < > 22 23 21*  BUN 45*   < > 41* 39* 37*  CREATININE 4.24*   < > 3.71* 3.57* 3.50*  CALCIUM 9.2   < > 8.1* 8.2* 8.8*  PROT 7.3  --   --   --   --   BILITOT 1.2  --   --   --   --   ALKPHOS 101  --   --   --   --   ALT 22  --   --   --   --   AST 27  --   --   --   --   GLUCOSE 299*   < > 169* 146* 159*   < > = values in this interval not displayed.     Imaging/Diagnostic Tests: CT ABDOMEN PELVIS  WO CONTRAST  Result Date: 01/11/2020 CLINICAL DATA:  Pt arrives via [facility] from home for c/o coffee ground emesis for the past 2 days, hypertensive upon ems arrival. Pt given 8mg  zofran en route, pt inducing vomiting in triage. Denies cough, sob, fevers or chills. Has extensive medical history. EMS VS: 180/110, 130HR, 99% on RA, 313 CBG, 380cc NS. Not on blood thinners currently. A/ox4. EXAM: CT ABDOMEN AND PELVIS WITHOUT CONTRAST TECHNIQUE: Multidetector CT imaging of the abdomen and pelvis was performed following the standard protocol without IV contrast. COMPARISON:  09/23/2019 FINDINGS: Lower chest: No acute abnormality. Hepatobiliary: No focal liver abnormality is seen.  No gallstones, gallbladder wall thickening, or biliary dilatation. Pancreas: Unremarkable. No pancreatic ductal dilatation or surrounding inflammatory changes. Spleen: Normal in size without focal abnormality. Adrenals/Urinary Tract: Adrenal glands are unremarkable. Kidneys are normal, without renal calculi, focal lesion, or hydronephrosis. Bladder is unremarkable. Stomach/Bowel: Stomach is unremarkable. Small bowel and colon are normal in caliber. No wall thickening. No inflammation. No evidence appendicitis. Vascular/Lymphatic: No significant vascular findings are present. No enlarged abdominal or pelvic lymph nodes. Reproductive: Unremarkable. Other: No abdominal wall hernia or abnormality. No abdominopelvic ascites. Musculoskeletal: No acute or significant osseous findings. IMPRESSION: 1. No acute findings. Exam within normal limits. No findings to account for coffee ground emesis. No evidence of bowel obstruction or inflammation. Electronically Signed   By: Lajean Manes M.D.   On: 01/11/2020 15:27   CT HEAD WO CONTRAST  Result Date: 01/11/2020 CLINICAL DATA:  Triage note: Pt arrives via [facility] from home for c/o coffee ground emesis for the past 2 days, hypertensive upon ems arrival. Pt given 8mg  zofran en route, pt inducing vomiting in  triage. Denies cough, sob, fevers or chills. Has extensive medical history. EMS VS: 180/110, 130HR, 99% on RA, 313 CBG, 380cc NS. Not on blood thinners currently. A/ox4 EXAM: CT HEAD WITHOUT CONTRAST TECHNIQUE: Contiguous axial images were obtained from the base of the skull through the vertex without intravenous contrast. COMPARISON:  09/23/2019 FINDINGS: Brain: In the right inferior cerebellum, there is an oval area of hypoattenuation, relatively well-defined, which involves the corticomedullary junction, measuring 2 cm in greatest dimension, representing a change from prior exam. This is consistent a subacute infarct. Well-defined encephalomalacia is noted in the left posterior temporal adjacent occipital lobes reflecting an old left PCA distribution infarct, stable from prior exam Ventricles normal in size and configuration. There are no parenchymal masses or mass effect. No other evidence of infarction. There are no extra-axial masses or abnormal fluid collections. There is no intracranial hemorrhage. Vascular: No hyperdense vessel or unexpected calcification. Skull: Normal. Negative for fracture or focal lesion. Sinuses/Orbits: Normal globes and orbits. Visualized sinuses and mastoid air cells are clear. Other: None. IMPRESSION: 1. 2 cm area of hypoattenuation in the peripheral, inferior right cerebellum consistent with a subacute infarct, new since the prior head CT. 2. No other evidence of an acute or recent abnormality. 3. Old left PCA distribution infarction. Electronically Signed   By: Lajean Manes M.D.   On: 01/11/2020 15:39   MR BRAIN WO CONTRAST  Result Date: 01/12/2020 CLINICAL DATA:  Initial evaluation for possible subacute infarct in the right cerebellum. EXAM: MRI HEAD WITHOUT CONTRAST TECHNIQUE: Multiplanar, multiecho pulse sequences of the brain and surrounding structures were obtained without intravenous contrast. COMPARISON:  Prior CT from 01/11/2020. FINDINGS: Brain: Examination  technically limited as the patient was unable to tolerate the full length of the exam. Diffusion-weighted sequences and axial T2 weighted sequence only were performed. Cerebral volume within normal limits for age. Encephalomalacia within the left occipital lobe compatible with chronic left PCA territory infarct. Associated small remote lacunar infarct at the lateral left thalamus. Additional small remote left cerebellar infarct noted (series 10, image 4). 19 mm focus of diffusion abnormality within the peripheral right cerebellum, consistent with acute to early subacute ischemic infarct. An additional punctate 4 mm focus of diffusion abnormality seen involving the cortical gray matter of the right frontal lobe (series 5, image 89), subacute in appearance as well. No associated mass effect or definite hemorrhage on this limited exam. No other evidence for acute or subacute  ischemia. No mass lesion, midline shift, or mass effect. No hydrocephalus. No extra-axial fluid collection. Pituitary gland suprasellar region normal. Midline structures intact. Vascular: Major intracranial vascular flow voids grossly maintained at the skull base. Skull and upper cervical spine: Craniocervical junction normal. Bone marrow signal intensity within normal limits. No scalp soft tissue abnormality. Sinuses/Orbits: Globes and orbital soft tissues grossly within normal limits. Paranasal sinuses are clear. No significant mastoid effusion. Other: None. IMPRESSION: 1. 19 mm acute to early subacute ischemic infarct involving the peripheral right cerebellum. 2. Additional 4 mm subacute ischemic infarct involving the right frontal cortex as above. 3. Underlying chronic left PCA and left cerebellar infarcts. Electronically Signed   By: Jeannine Boga M.D.   On: 01/12/2020 02:57     Lyndee Hensen, DO 01/12/2020, 10:20 AM PGY-1, [facility] Intern pager: [phone], text pages welcome

## 2020-01-12 NOTE — Progress Notes (Signed)
Pt had a CBG of 59 at 0259, Per Endo Tool, Iv Insulin should be stopped, but Dr Vanessa [place] ordered to continue with the IV Insulin at 0.23ml/hr after the recheck of CBG of 145 at 0319, next CBG due at 0400, will however continue to monitor. Ob[place]-Asidi, Lavan Imes Efe

## 2020-01-12 NOTE — Plan of Care (Signed)

## 2020-01-12 NOTE — Progress Notes (Signed)
  Echocardiogram 2D Echocardiogram has been performed.  George [name] 01/12/2020, 11:09 AM

## 2020-01-12 NOTE — Progress Notes (Signed)
Pt extremely uncooperative and unable to be redirected. Pt stopped exam twice by reaching up and disconnecting head coil. Limited brain protocol was preformed and completed.

## 2020-01-13 ENCOUNTER — Inpatient Hospital Stay (HOSPITAL_COMMUNITY): Payer: Medicaid Other

## 2020-01-13 DIAGNOSIS — E1022 Type 1 diabetes mellitus with diabetic chronic kidney disease: Secondary | ICD-10-CM

## 2020-01-13 DIAGNOSIS — I6340 Cerebral infarction due to embolism of unspecified cerebral artery: Secondary | ICD-10-CM

## 2020-01-13 LAB — URINE CULTURE: Culture: <10000 — AB

## 2020-01-13 LAB — BASIC METABOLIC PANEL
Anion gap: 6 (ref 5–15)
Anion gap: 6 (ref 5–15)
BUN: 25 mg/dL — ABNORMAL HIGH (ref 6–20)
BUN: 24 mg/dL — ABNORMAL HIGH (ref 6–20)
CO2: 22 mmol/L (ref 22–32)
CO2: 22 mmol/L (ref 22–32)
Calcium: 8 mg/dL — ABNORMAL LOW (ref 8.9–10.3)
Calcium: 8.2 mg/dL — ABNORMAL LOW (ref 8.9–10.3)
Chloride: 109 mmol/L (ref 98–111)
Chloride: 109 mmol/L (ref 98–111)
Creatinine, Ser: 2.91 mg/dL — ABNORMAL HIGH (ref 0.61–1.24)
Creatinine, Ser: 2.83 mg/dL — ABNORMAL HIGH (ref 0.61–1.24)
GFR calc Af Amer: 31 mL/min — ABNORMAL LOW (ref >60)
GFR calc Af Amer: 32 mL/min — ABNORMAL LOW (ref >60)
GFR calc non Af Amer: 27 mL/min — ABNORMAL LOW (ref >60)
GFR calc non Af Amer: 28 mL/min — ABNORMAL LOW (ref >60)
Glucose, Bld: 148 mg/dL — ABNORMAL HIGH (ref 70–99)
Glucose, Bld: 184 mg/dL — ABNORMAL HIGH (ref 70–99)
Potassium: 3.8 mmol/L (ref 3.5–5.1)
Potassium: 4 mmol/L (ref 3.5–5.1)
Sodium: 137 mmol/L (ref 135–145)
Sodium: 137 mmol/L (ref 135–145)

## 2020-01-13 LAB — GLUCOSE, CAPILLARY
Glucose-Capillary: 169 mg/dL — ABNORMAL HIGH (ref 70–99)
Glucose-Capillary: 234 mg/dL — ABNORMAL HIGH (ref 70–99)

## 2020-01-13 MED ORDER — NOVOLOG FLEXPEN 100 UNIT/ML ~~LOC~~ SOPN: 2.0000 [IU] | PEN_INJECTOR | Freq: Three times a day (TID) | SUBCUTANEOUS | 0 refills | Status: DC

## 2020-01-13 MED ORDER — "PEN NEEDLES 3/16"" 31G X 5 MM MISC": 1 refills | Status: DC

## 2020-01-13 MED ORDER — CARVEDILOL 12.5 MG PO TABS: 25.0000 mg | ORAL_TABLET | Freq: Two times a day (BID) | ORAL | Status: DC

## 2020-01-13 MED ORDER — INSULIN GLARGINE 100 UNIT/ML ~~LOC~~ SOLN: 15.0000 [IU] | SUBCUTANEOUS | 0 refills | Status: DC

## 2020-01-13 MED ORDER — GADOBUTROL 1 MMOL/ML IV SOLN
6.0000 mL | Freq: Once | INTRAVENOUS | Status: AC | PRN
  Administered 2020-01-13: 6 mL via INTRAVENOUS

## 2020-01-13 MED ORDER — AMLODIPINE BESYLATE 10 MG PO TABS
10.0000 mg | ORAL_TABLET | Freq: Every day | ORAL | Status: DC
  Administered 2020-01-13: 10 mg via ORAL
  Filled 2020-01-13: qty 1

## 2020-01-13 MED ORDER — HYDRALAZINE HCL 10 MG PO TABS
10.0000 mg | ORAL_TABLET | Freq: Three times a day (TID) | ORAL | Status: DC
  Administered 2020-01-13: 15:00:00 10 mg via ORAL
  Filled 2020-01-13: qty 1

## 2020-01-13 MED ORDER — ASPIRIN 81 MG PO TBEC: 81.0000 mg | DELAYED_RELEASE_TABLET | Freq: Every day | ORAL | 0 refills | Status: AC

## 2020-01-13 NOTE — TOC Transition Note (Signed)
Transition of Care [facility]) - CM/SW Discharge Note   Patient Details  Name: George [name] MRN: [id number] Date of Birth: 11/02/1986  [facility] of [facility]) CM/SW Contact:  Pollie Friar, RN Phone Number: 01/13/2020, 2:48 PM   Clinical Narrative:    Patient discharging home with self care. No f/u per PT/OT and no DME needs.  CM consulted for substance abuse. Pt states he has not had any alcohol in over a year. CM did provide him with online, outpatient, inpatient counseling resources.  Pt has transportation home and support at home.   Final next level of care: Home/Self Care Barriers to Discharge: No Barriers Identified   Patient Goals and CMS Choice        Discharge Placement                       Discharge Plan and Services                                     Social Determinants of Health (SDOH) Interventions     Readmission Risk Interventions No flowsheet data found.

## 2020-01-13 NOTE — Plan of Care (Signed)
  Problem: Activity: Goal: Risk for activity intolerance will decrease Outcome: Progressing   

## 2020-01-13 NOTE — Progress Notes (Signed)
STROKE TEAM PROGRESS NOTE   INTERVAL HISTORY Patient seen in bed, mom at bedside. No acute event overnight. Patient is being discharged.  Vitals:   01/13/20 0019 01/13/20 0415 01/13/20 0903 01/13/20 1201  BP:  (!) 164/94 (!) 178/93 (!) 170/101  Pulse:  86 74 (!) 106  Resp:   20 20  Temp: 98.5 F (36.9 C) 98.6 F (37 C) 98 F (36.7 C) 98.6 F (37 C)  TempSrc: Oral Axillary Oral Oral  SpO2:  100% 100% 100%  Weight:      Height:        CBC:  Recent Labs  Lab 01/11/20 0918 01/11/20 0918 01/11/20 1139 01/11/20 1800  WBC 24.4*  --   --  25.4*  NEUTROABS  --   --   --  23.4*  HGB 13.9   < > 12.6* 11.8*  HCT 40.4   < > 37.0* 34.5*  MCV 87.6  --   --  88.9  PLT 312  --   --  203   < > = values in this interval not displayed.    Basic Metabolic Panel:  Recent Labs  Lab 01/13/20 0225 01/13/20 0616  NA 137 137  K 3.8 4.0  CL 109 109  CO2 22 22  GLUCOSE 148* 184*  BUN 25* 24*  CREATININE 2.91* 2.83*  CALCIUM 8.0* 8.2*   Lipid Panel:     Component Value Date/Time   CHOL 128 01/12/2020 0546   CHOL 156 06/27/2019 1033   TRIG 215 (H) 01/12/2020 0546   HDL 25 (L) 01/12/2020 0546   HDL 76 06/27/2019 1033   CHOLHDL 5.1 01/12/2020 0546   VLDL 43 (H) 01/12/2020 0546   LDLCALC 60 01/12/2020 0546   LDLCALC 71 06/27/2019 1033   HgbA1c:  Lab Results  Component Value Date   HGBA1C 7.2 (H) 01/11/2020   Urine Drug Screen:     Component Value Date/Time   LABOPIA NONE DETECTED 01/11/2020 1342   COCAINSCRNUR NONE DETECTED 01/11/2020 1342   LABBENZ NONE DETECTED 01/11/2020 1342   AMPHETMU NONE DETECTED 01/11/2020 1342   THCU POSITIVE (A) 01/11/2020 1342   LABBARB NONE DETECTED 01/11/2020 1342    Alcohol Level     Component Value Date/Time   ETH <10 01/11/2020 1844    IMAGING past 48 hours CT ABDOMEN PELVIS WO CONTRAST  Result Date: 01/11/2020 CLINICAL DATA:  Pt arrives via [facility] from home for c/o coffee ground emesis for the past 2 days, hypertensive upon ems  arrival. Pt given 8mg  zofran en route, pt inducing vomiting in triage. Denies cough, sob, fevers or chills. Has extensive medical history. EMS VS: 180/110, 130HR, 99% on RA, 313 CBG, 380cc NS. Not on blood thinners currently. A/ox4. EXAM: CT ABDOMEN AND PELVIS WITHOUT CONTRAST TECHNIQUE: Multidetector CT imaging of the abdomen and pelvis was performed following the standard protocol without IV contrast. COMPARISON:  09/23/2019 FINDINGS: Lower chest: No acute abnormality. Hepatobiliary: No focal liver abnormality is seen. No gallstones, gallbladder wall thickening, or biliary dilatation. Pancreas: Unremarkable. No pancreatic ductal dilatation or surrounding inflammatory changes. Spleen: Normal in size without focal abnormality. Adrenals/Urinary Tract: Adrenal glands are unremarkable. Kidneys are normal, without renal calculi, focal lesion, or hydronephrosis. Bladder is unremarkable. Stomach/Bowel: Stomach is unremarkable. Small bowel and colon are normal in caliber. No wall thickening. No inflammation. No evidence appendicitis. Vascular/Lymphatic: No significant vascular findings are present. No enlarged abdominal or pelvic lymph nodes. Reproductive: Unremarkable. Other: No abdominal wall hernia or abnormality. No abdominopelvic ascites. Musculoskeletal:  No acute or significant osseous findings. IMPRESSION: 1. No acute findings. Exam within normal limits. No findings to account for coffee ground emesis. No evidence of bowel obstruction or inflammation. Electronically Signed   By: Lajean Manes M.D.   On: 01/11/2020 15:27   CT HEAD WO CONTRAST  Result Date: 01/11/2020 CLINICAL DATA:  Triage note: Pt arrives via [facility] from home for c/o coffee ground emesis for the past 2 days, hypertensive upon ems arrival. Pt given 8mg  zofran en route, pt inducing vomiting in triage. Denies cough, sob, fevers or chills. Has extensive medical history. EMS VS: 180/110, 130HR, 99% on RA, 313 CBG, 380cc NS. Not on blood thinners  currently. A/ox4 EXAM: CT HEAD WITHOUT CONTRAST TECHNIQUE: Contiguous axial images were obtained from the base of the skull through the vertex without intravenous contrast. COMPARISON:  09/23/2019 FINDINGS: Brain: In the right inferior cerebellum, there is an oval area of hypoattenuation, relatively well-defined, which involves the corticomedullary junction, measuring 2 cm in greatest dimension, representing a change from prior exam. This is consistent a subacute infarct. Well-defined encephalomalacia is noted in the left posterior temporal adjacent occipital lobes reflecting an old left PCA distribution infarct, stable from prior exam Ventricles normal in size and configuration. There are no parenchymal masses or mass effect. No other evidence of infarction. There are no extra-axial masses or abnormal fluid collections. There is no intracranial hemorrhage. Vascular: No hyperdense vessel or unexpected calcification. Skull: Normal. Negative for fracture or focal lesion. Sinuses/Orbits: Normal globes and orbits. Visualized sinuses and mastoid air cells are clear. Other: None. IMPRESSION: 1. 2 cm area of hypoattenuation in the peripheral, inferior right cerebellum consistent with a subacute infarct, new since the prior head CT. 2. No other evidence of an acute or recent abnormality. 3. Old left PCA distribution infarction. Electronically Signed   By: Lajean Manes M.D.   On: 01/11/2020 15:39   MR ANGIO HEAD WO CONTRAST  Result Date: 01/13/2020 CLINICAL DATA:  Stroke workup EXAM: MRA HEAD WITHOUT CONTRAST TECHNIQUE: Angiographic images of the Circle of Willis were obtained using MRA technique without intravenous contrast. COMPARISON:  06/14/2019 FINDINGS: Carotid and vertebral arteries are widely patent. The left vertebral artery is dominant. The basilar is smooth and widely patent with minimal flow type artifact along the posterior lumen of the distal basilar. Poor flow in the inferior branch of the left PCA  correlating with remote infarct. No acute finding, proximal flow limiting stenosis, or beading to correlate with the new infarcts. IMPRESSION: 1. No emergent finding. 2. Chronic poor left PCA branch flow correlating with remote infarct. Electronically Signed   By: Monte Fantasia M.D.   On: 01/13/2020 09:24   MR ANGIO NECK W WO CONTRAST  Result Date: 01/13/2020 CLINICAL DATA:  Acute infarcts. EXAM: MRA NECK WITHOUT AND WITH CONTRAST TECHNIQUE: Multiplanar and multiecho pulse sequences of the neck were obtained without and with intravenous contrast. Angiographic images of the neck were obtained using MRA technique without and with intravenous contrast. CONTRAST:  56mL GADAVIST GADOBUTROL 1 MMOL/ML IV SOLN COMPARISON:  None. FINDINGS: Antegrade flow in both carotid and vertebral arteries on time-of-flight imaging. Normal arch. Mild bilateral ICA tortuosity without beading. Vessels are smoothly contoured and diffusely patent in the neck. There is relative mild narrowing at the left ICA bulb, presumably early atherosclerosis. IMPRESSION: 1. No emergent finding or flow limiting stenosis. 2. Mild narrowing at the left ICA bulb. Electronically Signed   By: Monte Fantasia M.D.   On: 01/13/2020 09:26  MR BRAIN WO CONTRAST  Result Date: 01/12/2020 CLINICAL DATA:  Initial evaluation for possible subacute infarct in the right cerebellum. EXAM: MRI HEAD WITHOUT CONTRAST TECHNIQUE: Multiplanar, multiecho pulse sequences of the brain and surrounding structures were obtained without intravenous contrast. COMPARISON:  Prior CT from 01/11/2020. FINDINGS: Brain: Examination technically limited as the patient was unable to tolerate the full length of the exam. Diffusion-weighted sequences and axial T2 weighted sequence only were performed. Cerebral volume within normal limits for age. Encephalomalacia within the left occipital lobe compatible with chronic left PCA territory infarct. Associated small remote lacunar infarct at  the lateral left thalamus. Additional small remote left cerebellar infarct noted (series 10, image 4). 19 mm focus of diffusion abnormality within the peripheral right cerebellum, consistent with acute to early subacute ischemic infarct. An additional punctate 4 mm focus of diffusion abnormality seen involving the cortical gray matter of the right frontal lobe (series 5, image 89), subacute in appearance as well. No associated mass effect or definite hemorrhage on this limited exam. No other evidence for acute or subacute ischemia. No mass lesion, midline shift, or mass effect. No hydrocephalus. No extra-axial fluid collection. Pituitary gland suprasellar region normal. Midline structures intact. Vascular: Major intracranial vascular flow voids grossly maintained at the skull base. Skull and upper cervical spine: Craniocervical junction normal. Bone marrow signal intensity within normal limits. No scalp soft tissue abnormality. Sinuses/Orbits: Globes and orbital soft tissues grossly within normal limits. Paranasal sinuses are clear. No significant mastoid effusion. Other: None. IMPRESSION: 1. 19 mm acute to early subacute ischemic infarct involving the peripheral right cerebellum. 2. Additional 4 mm subacute ischemic infarct involving the right frontal cortex as above. 3. Underlying chronic left PCA and left cerebellar infarcts. Electronically Signed   By: Jeannine Boga M.D.   On: 01/12/2020 02:57   DG CHEST PORT 1 VIEW  Result Date: 01/12/2020 CLINICAL DATA:  Fever. EXAM: PORTABLE CHEST 1 VIEW COMPARISON:  September 23, 2019. FINDINGS: The heart size and mediastinal contours are within normal limits. Both lungs are clear. The visualized skeletal structures are unremarkable. IMPRESSION: No active disease. Electronically Signed   By: Marijo Conception M.D.   On: 01/12/2020 12:37   ECHOCARDIOGRAM COMPLETE  Result Date: 01/12/2020   ECHOCARDIOGRAM REPORT   Patient Name:   FREDI SHEAHAN Date of Exam:  01/12/2020 Medical Rec #:  [id number]        Height:       63.0 in Accession #:    [id number]       Weight:       125.0 lb Date of Birth:  22-Feb-1986        BSA:          1.58 m Patient Age:    83 years         BP:           159/94 mmHg Patient Gender: M                HR:           108 bpm. Exam Location:  Inpatient Procedure: 2D Echo Indications:    Stroke 434.91/I163.9  History:        Patient has prior history of Echocardiogram examinations, most                 recent 06/20/2019. Risk Factors:Diabetes and Hypertension. CKD.  Sonographer:    Clayton Lefort RDCS (AE) Referring Phys: [id number] [place]  1. Left ventricular ejection fraction,  by visual estimation, is 55 to 60%. The left ventricle has normal function. Left ventricular septal wall thickness was mildly increased. Mildly increased left ventricular posterior wall thickness. There is mildly increased left ventricular hypertrophy.  2. Left ventricular diastolic parameters are consistent with Grade I diastolic dysfunction (impaired relaxation).  3. The left ventricle has no regional wall motion abnormalities.  4. Global right ventricle has normal systolic function.The right ventricular size is normal. No increase in right ventricular wall thickness.  5. Left atrial size was mildly dilated.  6. Right atrial size was normal.  7. The mitral valve is normal in structure. Mild mitral valve regurgitation. No evidence of mitral stenosis.  8. The tricuspid valve is normal in structure.  9. The aortic valve is tricuspid. Aortic valve regurgitation is not visualized. No evidence of aortic valve sclerosis or stenosis. 10. The pulmonic valve was normal in structure. Pulmonic valve regurgitation is not visualized. 11. TR signal is inadequate for assessing pulmonary artery systolic pressure. 12. The inferior vena cava is normal in size with greater than 50% respiratory variability, suggesting right atrial pressure of 3 mmHg. FINDINGS  Left Ventricle: Left  ventricular ejection fraction, by visual estimation, is 55 to 60%. The left ventricle has normal function. The left ventricle has no regional wall motion abnormalities. Mildly increased left ventricular posterior wall thickness. There is mildly increased left ventricular hypertrophy. Left ventricular diastolic parameters are consistent with Grade I diastolic dysfunction (impaired relaxation). Normal left atrial pressure. Right Ventricle: The right ventricular size is normal. No increase in right ventricular wall thickness. Global RV systolic function is has normal systolic function. Left Atrium: Left atrial size was mildly dilated. Right Atrium: Right atrial size was normal in size Pericardium: There is no evidence of pericardial effusion. Mitral Valve: The mitral valve is normal in structure. Mild mitral valve regurgitation. No evidence of mitral valve stenosis by observation. Tricuspid Valve: The tricuspid valve is normal in structure. Tricuspid valve regurgitation is trivial. Aortic Valve: The aortic valve is tricuspid. Aortic valve regurgitation is not visualized. The aortic valve is structurally normal, with no evidence of sclerosis or stenosis. Aortic valve mean gradient measures 5.0 mmHg. Aortic valve peak gradient measures 7.5 mmHg. Aortic valve area, by VTI measures 2.68 cm. Pulmonic Valve: The pulmonic valve was normal in structure. Pulmonic valve regurgitation is not visualized. Pulmonic regurgitation is not visualized. Aorta: The aortic root, ascending aorta and aortic arch are all structurally normal, with no evidence of dilitation or obstruction. Venous: The inferior vena cava is normal in size with greater than 50% respiratory variability, suggesting right atrial pressure of 3 mmHg. IAS/Shunts: No atrial level shunt detected by color flow Doppler. There is no evidence of a patent foramen ovale. No ventricular septal defect is seen or detected. There is no evidence of an atrial septal defect.  LEFT  VENTRICLE PLAX 2D LVIDd:         4.64 cm  Diastology LVIDs:         3.32 cm  LV e' lateral: 11.30 cm/s LV PW:         1.28 cm LV IVS:        1.31 cm LVOT diam:     2.10 cm LV SV:         55 ml LV SV Index:   34.28 LVOT Area:     3.46 cm  RIGHT VENTRICLE          IVC RV Basal diam:  2.32 cm  IVC diam: 1.74  cm TAPSE (M-mode): 2.1 cm LEFT ATRIUM             Index       RIGHT ATRIUM           Index LA diam:        2.60 cm 1.64 cm/m  RA Area:     13.20 cm LA Vol (A2C):   47.8 ml 30.19 ml/m RA Volume:   31.80 ml  20.08 ml/m LA Vol (A4C):   40.8 ml 25.77 ml/m LA Biplane Vol: 47.0 ml 29.68 ml/m  AORTIC VALVE AV Area (Vmax):    2.52 cm AV Area (Vmean):   2.12 cm AV Area (VTI):     2.68 cm AV Vmax:           137.00 cm/s AV Vmean:          109.000 cm/s AV VTI:            0.284 m AV Peak Grad:      7.5 mmHg AV Mean Grad:      5.0 mmHg LVOT Vmax:         99.80 cm/s LVOT Vmean:        66.600 cm/s LVOT VTI:          0.220 m LVOT/AV VTI ratio: 0.77  AORTA Ao Root diam: 2.80 cm Ao Asc diam:  2.90 cm  SHUNTS Systemic VTI:  0.22 m Systemic Diam: 2.10 cm  Skeet Latch MD Electronically signed by Skeet Latch MD Signature Date/Time: 01/12/2020/2:02:47 PM    Final     PHYSICAL EXAM   Temp:  [98 F (36.7 C)-99.9 F (37.7 C)] 98.6 F (37 C) (01/15 1201) Pulse Rate:  [74-106] 106 (01/15 1201) Resp:  [12-20] 20 (01/15 1201) BP: (164-178)/(93-109) 170/101 (01/15 1201) SpO2:  [100 %] 100 % (01/15 1201)  General - Well nourished, well developed, in no apparent distress.  Ophthalmologic - fundi not visualized due to noncooperation.  Cardiovascular - Regular rhythm and rate.  Mental Status -  Level of arousal and orientation to time, place, and person were intact. Language including expression, naming, repetition, comprehension was assessed and found intact. Fund of Knowledge was assessed and was intact.  Cranial Nerves II - XII - II - Visual field intact OU. III, IV, VI - Extraocular movements  intact. V - Facial sensation intact bilaterally. VII - Facial movement intact bilaterally. VIII - Hearing & vestibular intact bilaterally. X - Palate elevates symmetrically. XI - Chin turning & shoulder shrug intact bilaterally. XII - Tongue protrusion intact.  Motor Strength - The patient's strength was normal in all extremities and pronator drift was absent.  Bulk was normal and fasciculations were absent.   Motor Tone - Muscle tone was assessed at the neck and appendages and was normal.  Reflexes - The patient's reflexes were symmetrical in all extremities and he had no pathological reflexes.  Sensory - Light touch, temperature/pinprick were assessed and were symmetrical.    Coordination - The patient had normal movements in the hands and feet with no ataxia or dysmetria.  Tremor was absent.  Gait and Station - deferred.   ASSESSMENT/PLAN [name] is a 34 y.o. male with history of DKA, tyle I DB, HTN, CKD stage III presenting with hematemesis. Found to have altered mental status. CT with possible R cerebellar infarct.    Stroke:  Recurrent small R cerebellar and punctate R frontal cortical infarcts in pts w/ hx of embolic strokes in setting of multiple  risk factors  CT head subacute inferior R cerebellar infarct. Old L PCA infarct   MRI  Acute to subacute peripheral R cerebellar infarct. Additional subacute infarct R frontal cortex. Old L PCA and L cerebellar infarcts.   MRA head chronic poor L PCA flow c/w old infarct in that area  MRA neck mild narrowing L ICA bulb   2D Echo EF 55-60%  LDL 60  HgbA1c 7.2  Lovenox 30 mg sq daily for VTE prophylaxis  clopidogrel 75 mg daily prior to admission, now on ASA 81 and plavix 75 DAPT for 3 weeks and then plavix alone.  Therapy recommendations:  No therapy needs  Disposition:  Return home [place] for d/c from stroke standpoint Follow-up Stroke Clinic at [facility] in 4 weeks. Office will call with  appointment date and time. Order placed.  Hx stroke/TIA  10/2019 14 day Zio patch - no Afib  08/2019 - multiple bilateral infarcts right cerebellar, right parietal - embolic pattern but more likely due to synchronized small vessel diease. No residue deficits. TEE neg. Put on DAPT x 3 wks  05/2019 - Large L PCA infarct, embolic pattern, secondary to large vessel disease vs cardioembolic source. Aphasia. MRA left PCA stenosis. TEE no POF, EF 50-55%, no DVT, hypercoagulable labs neg. LDL 102 and A1C 8.4. Put on DAPT x 3 mos and lipitor 40  DKA, recurrent Uncontrolled DM  Now on insulin drip  Still hyperglycemia  A1C 7.2, goal < 7.0  SSI  CBG monitoring  Recommend close PCP follow up  Recommend endocrinology follow up and consider insulin pump  Hypertensive Urgency  BP as high as 184/113  Remains elevated . Permissive hypertension (OK if < 220/120) but gradually normalize in 3-5 days . Long-term BP goal normotensive  Hyperlipidemia  Home meds:  crestor 20  LDL 60, goal < 70  Resume crestor 20  Continue statin at discharge  Tobacco abuse  Current smoker  Smoking cessation counseling provided  Pt is willing to quit  Other Stroke Risk Factors  Hx ETOH use, concern for alcohol use. On CIWA protocol. sitter  Substance abuse - UDS:  THC POSITIVE, Patient advised to stop using due to stroke risk.  Other Active Problems  N/V likely d/t mild DKA, Type I DM  Acute encephalopathy/AMS  Acute on CKD stage IV Cre 3.50  GERD  Hospital day # 2  Neurology will sign off. Please call with questions. Pt will follow up with stroke clinic NP at [facility] in about 4 weeks. Thanks for the consult.  Rosalin Hawking, MD PhD Stroke Neurology 01/13/2020 3:09 PM    To contact Stroke Continuity provider, please refer to [email or web address] After hours, contact General Neurology

## 2020-01-13 NOTE — Discharge Instructions (Signed)
Thank you so much for allowing [place] to be part of your care.  Your admitted to the hospital for a mild diabetic ketoacidosis, feel that your body was put under increased stress as you also experienced a new stroke.  It is of the utmost importance that you take Crestor, keep your blood pressure under control, and keep your diabetes under control.  You will continue aspirin and Plavix for 3 weeks and then continue with Plavix only afterwards.  For your diabetes, we transitioned your regimen to a Lantus (a long-acting insulin that you will take once daily) and NovoLog (a short acting insulin that you will take with meals).  You may continue to check your sugars as you have been at home.  Since we are transitioning from your old regimen, we have slightly decreased the amount of total insulin you are receiving and so your sugars may not be as well-controlled as previous --you will most certainly need very close follow-up with your primary care doc on titrating these medications as appropriate based off of your sugars.  Please asked the pharmacist when you pick up these medications about any questions you may have about these new insulins.  Please make sure you follow-up with your neurology team, cardiology, and your primary care doc.  Your PCP had placed a referral for you to go to endocrinology to discuss her diabetes, you can always try to give [facility] endocrinology a call to schedule an appointment.  If you develop any change in mental status, weakness, feeling lethargic, worsening abdominal pain/vomiting, or your sugars are consistently >250 please make sure you seek medical care including either the ED/PCP pending on severity.

## 2020-01-13 NOTE — Discharge Summary (Addendum)
[facility] Discharge Summary  Patient name: George [name] Medical record number: [id number] Date of birth: 1986/08/06 Age: 34 y.o. Gender: male Date of Admission: 01/11/2020  Date of Discharge: 01/13/20  Admitting Physician: Lyndee Hensen, DO  Primary Care Provider: Elsie Stain, MD Consultants: Neurology   Indication for Hospitalization: DKA  Discharge Diagnoses/Problem List:  DKA in T1DM Fever Hematemesis Non-intractable nausea and vomiting Subacute CVA Acute on chronic CKD stage IV Anemia GERD  Disposition: Home   Discharge Condition: Improved   Discharge Exam:   GEN: alert and conversational, in no acute distress  CV: regular rate and rhythm, no murmurs appreciated RESP: no increased work of breathing, clear to ascultation bilaterally with no crackles, wheezes, or rhonchi  ABD: Bowel sounds present. Soft, Nontender, Nondistended.  MSK: no edema, or cyanosis noted SKIN: warm, dry NEURO: grossly normal, moves all extremities appropriately, alert and oriented  PSYCH: Normal affect and thought content  Brief Hospital Course:   Type 1 diabetes  nausea vomiting  DKA  hematemesis June Koestler is a 34 y.o. male presented after 3 days of vomiting that progressed to bloody emesis which prompted patient to seek care.  Mom reported patient had CBGs ranging 120-320.  On arrival, patient tachycardic (HR120s) and hypertensive (SBP 170s).  Labs concerning for mild DKA with BHB 3.73, CBG 300, bicarb 17, pH 7.4 with elevated anion gap of 24.   He was given 2 L bolus in the ED. DKA protocol initiated and patient started on insulin drip and IV potassium.    Patient was transitioned to SQ insulin on 01/12/20.  Etiology of DKA unclear however patient did have of subacute stroke on CT head which could have been the precipitating factor for his DKA. Overall, Pt's diabetes is controlled as A1c was 7.2.  Mom and patient wanted to start the hospital  regimen of Lantus and NovoLog in the outpatient setting.  Patient was discharged with 15 Lantus and 3 units (Novolog) mealtime coverage.  Patient has a pending referral for [facility] endocrinology.   Acute encephalopathy   subacute CVA [place] had inappropriate answers to orientation questions (What year is it "8th" , Whats your name "august 12th", Where are you " working on it" ) thus CT head was obtained.  CT head indicated 2 cm subacute infarct in the cerebellum that was not present on previous imaging.  Patient with history of 2 ischemic strokes in the past 6 months.  Neurology was consulted and recommended dual antiplatelet therapy for the next 3 weeks followed by monotherapy with Plavix thereafter.  MRA head and neck obtained did not show any emergent findings of flow limiting stenosis.  Patient to follow-up with stroke clinic at [facility] neurology patient in 4 weeks.    Hematemesis Patient with vomiting that progressed to blood-tinged emesis over the past several days.  Suspect small Mallory-Weiss tear due to frequent retching.  For GI prophylaxis, Protonix infusion initiated in the ED.   Hematemesis resolved on day 1 of admission.  Hemoglobin remained reassuring throughout his admission. He was transitioned to oral Protonix prior to discharge.   Hypertension Due to persistent vomiting, patient was unable to take antihypertensives.  On arrival SBP's were in the 170s.  Permissive hypertension was allowed for 72 hours for subacute stroke.    Acute on chronic CKD stage IV Creatinine on admission 4.24, likely prerenal in the setting of dehydration due to vomiting.  Creatinine returned to baseline prior to discharge.  Patient to follow up outpatient  with [place] [facility].  Fever Patient had isolated fever of 100.5 F on 01/12/20.  Leukocytosis (WBC 25.4) likely due to leukemoid reaction secondary to DKA or hemoconcentration secondary to dehydration.  Blood cultures have no growth to date and urine  cultures had insignificant growth.  Lactic acid was reassuring at 0.7.  No empiric antibiotics were started.   Issues for Follow Up:  Continue titrating patient's basal (Lantis) and short-acting insulin (Novolog).  Patient with permissive hypertension for the next few days.  We discontinued Clonidine. If needed, consider clonidine patch instead tablets. He should resume all other antihypertensive medications. UDS was positive for THC.  Continue talking with the patient about the risk of THC use and stroke.  Significant Procedures: None   Significant Labs and Imaging:  Recent Labs  Lab 01/11/20 0918 01/11/20 1139 01/11/20 1800  WBC 24.4*  --  25.4*  HGB 13.9 12.6* 11.8*  HCT 40.4 37.0* 34.5*  PLT 312  --  203   Recent Labs  Lab 01/11/20 0918 01/11/20 1139 01/12/20 1150 01/12/20 1150 01/12/20 1550 01/12/20 1550 01/12/20 2244 01/12/20 2244 01/13/20 0225 01/13/20 0616  NA 136   < > 138  --  140  --  141  --  137 137  K 4.0   < > 4.2   < > 4.0   < > 4.0   < > 3.8 4.0  CL 95*   < > 109  --  109  --  112*  --  109 109  CO2 17*   < > 20*  --  20*  --  23  --  22 22  GLUCOSE 299*   < > 319*  --  206*  --  103*  --  148* 184*  BUN 45*   < > 33*  --  32*  --  27*  --  25* 24*  CREATININE 4.24*   < > 3.24*  --  3.19*  --  3.03*  --  2.91* 2.83*  CALCIUM 9.2   < > 8.4*  --  8.2*  --  8.1*  --  8.0* 8.2*  ALKPHOS 101  --   --   --   --   --   --   --   --   --   AST 27  --   --   --   --   --   --   --   --   --   ALT 22  --   --   --   --   --   --   --   --   --   ALBUMIN 4.6  --   --   --   --   --   --   --   --   --    < > = values in this interval not displayed.      Results/Tests Pending at Time of Discharge: None  Discharge Medications:  Allergies as of 01/13/2020   No Known Allergies      Medication List     STOP taking these medications    cloNIDine 0.2 MG tablet Commonly known as: CATAPRES   insulin NPH Human 100 UNIT/ML injection Commonly known as:  NOVOLIN N   insulin regular 100 units/mL injection Commonly known as: NOVOLIN R       TAKE these medications    acetaminophen 325 MG tablet Commonly known as: TYLENOL Take 650 mg by mouth every 6 (six)  hours as needed for headache.   amLODipine 10 MG tablet Commonly known as: NORVASC Take 1 tablet (10 mg total) by mouth daily. Notes to patient: 01/14/2020   aspirin 81 MG EC tablet Take 1 tablet (81 mg total) by mouth daily for 21 days.   carvedilol 25 MG tablet Commonly known as: COREG Take 1 tablet (25 mg total) by mouth 2 (two) times daily with a meal. Notes to patient: 01/13/2020   clopidogrel 75 MG tablet Commonly known as: PLAVIX Take 1 tablet (75 mg total) by mouth daily. Notes to patient: 01/14/2020   ferrous sulfate 325 (65 FE) MG tablet Take 1 tablet (325 mg total) by mouth daily with breakfast. Notes to patient: 01/14/2020   hydrALAZINE 10 MG tablet Commonly known as: APRESOLINE Take 1 tablet (10 mg total) by mouth every 8 (eight) hours. Notes to patient: 01/13/2020   insulin glargine 100 UNIT/ML injection Commonly known as: LANTUS Inject 0.15 mLs (15 Units total) into the skin daily. Notes to patient: 01/13/2020   NovoLOG FlexPen 100 UNIT/ML FlexPen Generic drug: insulin aspart Inject 2 Units into the skin 3 (three) times daily with meals. Notes to patient: 01/13/2020   OneTouch Verio test strip Generic drug: glucose blood Use as instructed to check blood sugar 3 times daily. Notes to patient: Continue home schedule   pantoprazole 40 MG tablet Commonly known as: PROTONIX Take 1 tablet (40 mg total) by mouth daily. For stomach protection Notes to patient: 01/14/2020   Pen Needles 3/16" 31G X 5 MM Misc To inject insulin with novolog flex pen, 3 times daily with meals. Notes to patient: 01/13/2020   rosuvastatin 20 MG tablet Commonly known as: Crestor Take 1 tablet (20 mg total) by mouth daily. Notes to patient: 01/14/2020        Discharge  Instructions: Please refer to Patient Instructions section of EMR for full details.  Patient was counseled important signs and symptoms that should prompt return to medical care, changes in medications, dietary instructions, activity restrictions, and follow up appointments.   Follow-Up Appointments: Follow-up Information     Elsie Stain, MD. Schedule an appointment as soon as possible for a visit in 3 day(s).   Specialty: Pulmonary Disease Why: Please schedule an appointment to see your primary care provider next week, it is utmost importance to make sure we have you on an appropriate insulin and blood pressure regimen. Contact information: [address] [place] [address] [phone]         Kate Sable, MD .   Specialties: Cardiology, Radiology Contact information: [place] [address] [phone]         [facility] Endocrinology. Call.   Specialty: Internal Medicine Why: Your PCP has already placed referral for you to see them.  Please call and see if you get an appointment scheduled. Contact information: [address], [place] [phone]        Nephrologist/kidney doctor Follow up.   Why: Please make sure you follow-up with your kidney doctor for your kidney disease.        [facility] Follow up in 4 week(s).   Specialty: Neurology Why: stroke clinic. office will call with appt date and time.  Contact information: [address] [place], [place], DO 01/14/2020, 6:18 PM PGY-3, [place]

## 2020-01-13 NOTE — Evaluation (Signed)
Clinical/Bedside Swallow Evaluation Patient Details  Name: George [name] MRN: [id number] Date of Birth: [birth year]  Today's Date: 01/13/2020 Time: SLP Start Time (ACUTE ONLY): 0910 SLP Stop Time (ACUTE ONLY): 0920 SLP Time Calculation (min) (ACUTE ONLY): 10 min  Past Medical History:  Past Medical History:  Diagnosis Date  . AKI (acute kidney injury) ([place])   . Cerebral infarction ([place])   . Hypertension   . Neurologic deficit due to acute ischemic cerebrovascular accident (CVA) ([place]) 06/13/2019  . Supraventricular tachycardia ([place])   . Type 1 diabetes ([place])    Past Surgical History:  Past Surgical History:  Procedure Laterality Date  . BUBBLE STUDY  06/20/2019   Procedure: BUBBLE STUDY;  Surgeon: Fay Records, MD;  Location: [place];  Service: Cardiovascular;;  . TEE WITHOUT CARDIOVERSION N/A 06/20/2019   Procedure: TRANSESOPHAGEAL ECHOCARDIOGRAM (TEE);  Surgeon: Fay Records, MD;  Location: [facility] ENDOSCOPY;  Service: Cardiovascular;  Laterality: N/A;   HPI:   34 y.o. male admitted 01/11/20 with hematemesis and AMS. Worked up for DKA. MRI with acute to subacute R cerebellar infarct, subacute infarct R frontal cortex; old L PCA and L cerebellar infarcts. PMH includes DM1, HTN, CKD 3, recent CVAs (05/2019, 08/2019)   Assessment / Plan / Recommendation Clinical Impression  Pts swallow appears within functional limits. Oral motor exam unremarkable. Pt with what appeared to be timely strong swallow, per palpation with POs. 3 oz water challenge also unremarkable. No overt s/sx of aspiration with any POs. Continue regular thin liquid diet with standard aspiration precautions. No further ST needs identified.   SLP Visit Diagnosis: Dysphagia, unspecified (R13.10)    Aspiration Risk  Mild aspiration risk    Diet Recommendation   Regular, thin liquids   Medication Administration: Whole meds with liquid    Other  Recommendations Oral Care Recommendations: Oral care BID   Follow up  Recommendations None      Frequency and Duration   NA         Prognosis   Good     Swallow Study   General Date of Onset: 01/11/20 HPI:  34 y.o. male admitted 01/11/20 with hematemesis and AMS. Worked up for DKA. MRI with acute to subacute R cerebellar infarct, subacute infarct R frontal cortex; old L PCA and L cerebellar infarcts. PMH includes DM1, HTN, CKD 3, recent CVAs (05/2019, 08/2019) Type of Study: Bedside Swallow Evaluation Previous Swallow Assessment: none on file  Diet Prior to this Study: Regular;Thin liquids Temperature Spikes Noted: No Respiratory Status: Room air History of Recent Intubation: No Behavior/Cognition: Alert;Cooperative Oral Cavity Assessment: Within Functional Limits Oral Care Completed by SLP: No Oral Cavity - Dentition: Adequate natural dentition Vision: Functional for self-feeding Self-Feeding Abilities: Able to feed self Patient Positioning: Upright in bed Baseline Vocal Quality: Normal Volitional Swallow: Able to elicit    Oral/Motor/Sensory Function Overall Oral Motor/Sensory Function: Within functional limits   Ice Chips Ice chips: Not tested   Thin Liquid Thin Liquid: Within functional limits Presentation: Cup;Straw    Nectar Thick Nectar Thick Liquid: Not tested   Honey Thick Honey Thick Liquid: Not tested   Puree Puree: Not tested   Solid     Solid: Within functional limits Presentation: Self Fed      Emeri Estill E Daryl Quiros MA, CCC-SLP Acute Rehabilitation Services  01/13/2020,9:28 AM

## 2020-01-13 NOTE — Evaluation (Signed)
Occupational Therapy Evaluation Patient Details Name: George [name] MRN: [id number] DOB: Feb 24, 1986 Today's Date: 01/13/2020    History of Present Illness Pt is a 34 y.o. male admitted 01/11/20 with hematemesis and AMS. Worked up for DKA. MRI with acute to subacute R cerebellar infarct, subacute infarct R frontal cortex; old L PCA and L cerebellar infarcts. PMH includes DM1, HTN, CKD 3, recent CVAs (05/2019, 08/2019).   Clinical Impression   Patient evaluated by Occupational Therapy with no further acute OT needs identified. All education has been completed and the patient has no further questions. See below for any follow-up Occupational Therapy or equipment needs. OT to sign off. Thank you for referral.   Pt wore mask entire session    Follow Up Recommendations  No OT follow up    Equipment Recommendations  None recommended by OT    Recommendations for Other Services       Precautions / Restrictions Precautions Precaution Comments: HTN watch BP       Mobility Bed Mobility Overal bed mobility: Independent                Transfers Overall transfer level: Independent                    Balance                                           ADL either performed or assessed with clinical judgement   ADL Overall ADL's : Independent                                       General ADL Comments: demonstrated transfer into tub and sittin gin the bottom of tub.      Vision Baseline Vision/History: Wears glasses Wears Glasses: At all times Patient Visual Report: Other (comment)(has changes 05/2019 CVA caused changes) Additional Comments: pt with pending eye surgery per patient recommendation was made 10/2019 appointment with MD     Perception     Praxis      Pertinent Vitals/Pain       Hand Dominance     Extremity/Trunk Assessment Upper Extremity Assessment Upper Extremity Assessment: Overall WFL for tasks assessed    Lower Extremity Assessment Lower Extremity Assessment: Overall WFL for tasks assessed   Cervical / Trunk Assessment Cervical / Trunk Assessment: Normal   Communication Communication Communication: No difficulties   Cognition Arousal/Alertness: Awake/alert Behavior During Therapy: WFL for tasks assessed/performed Overall Cognitive Status: Within Functional Limits for tasks assessed                                     General Comments  BP 170/99 s/p movement -- supine 177/103    Exercises     Shoulder Instructions      Home Living Family/patient expects to be discharged to:: Private residence Living Arrangements: Parent Available Help at Discharge: Family;Available PRN/intermittently Type of Home: House Home Access: Stairs to enter [employer] of Steps: 2 Entrance Stairs-Rails: None Home Layout: One level     Bathroom Shower/Tub: Teacher, early years/pre: Standard     Home Equipment: Kasandra Knudsen - single point   Additional Comments: Mom works as Pharmacist, hospital  Prior Functioning/Environment Level of Independence: Independent        Comments: Does not work, primarily staying at home due to [employer] and health concerns        OT Problem List:        OT Treatment/Interventions:      OT Goals(Current goals can be found in the care plan section) Acute Rehab OT Goals Patient Stated Goal: to see girlfriend more  OT Frequency:     Barriers to D/C:            Co-evaluation              AM-PAC OT "6 Clicks" Daily Activity     Outcome Measure Help from another person eating meals?: None Help from another person taking care of personal grooming?: None Help from another person toileting, which includes using toliet, bedpan, or urinal?: None Help from another person bathing (including washing, rinsing, drying)?: None Help from another person to put on and taking off regular upper body clothing?: None Help from another person to  put on and taking off regular lower body clothing?: None 6 Click Score: 24   End of Session Equipment Utilized During Treatment: Gait belt Nurse Communication: Mobility status;Precautions  Activity Tolerance: Patient tolerated treatment well Patient left: in bed;with call bell/phone within reach  OT Visit Diagnosis: Unsteadiness on feet (R26.81)                Time: 1100-1127 OT Time Calculation (min): 27 min Charges:  OT General Charges $OT Visit: 1 Visit OT Evaluation $OT Eval Moderate Complexity: 1 Mod   Brynn, OTR/L  Acute Rehabilitation Services Pager: [phone] Office: [phone] .   Jeri Modena 01/13/2020, 12:07 PM

## 2020-01-13 NOTE — Progress Notes (Signed)
Family Medicine Teaching Service Daily Progress Note Intern Pager: [phone]  Patient name: George [name] Medical record number: [id number] Date of birth: [birth year] Age: 34 y.o. Gender: male  Primary Care Provider: Elsie Stain, MD Consultants: Neurology Code Status: Full   Pt Overview and Major Events to Date:  01/11/20: Admitted   Assessment and Plan:  N/V likely 2/2 to mild DKA  T1DM Fasting CBG 169.  Potassium 4.0, AG 6, Bicarb 22, 23.  Patient received 3 units SSI and 12u basal insulin overnight however transitioned around 9pm yesterday. BHB last night 0.49. Patient medically optimized for discharge today on home regimen as a1c of 7.2 evident of diabetes being well controlled. After discussion with patient and mom, patient would like to be discharged with Lantis/Short acting regimen. He does not wish to have insulin pump. Patient has [place] endocrinology referral placed already.  - Discontinue 163mL/hr NS  - D50 as needed for low CGB  - moderate SSI with meals  -  SSI bedtime coverage  - 12u Lantis qhs  - Monitor CBGs  - Cardiac Monitoring, pulse oximetry  - s/p 3x 1L NS bolus - s/p Protonix infusion for hematemesis   - s/p KCl IV qhr -Compazine 5mg  IV every 6 hours as needed -Diabetes educatior consult   Fever  Patient spiked a fever to 100.30F yesterday however remained afebrile since. Lactic acid 0.7. Leukocytosis 25.4 to be due to leukemoid reaction secondary to DKA or hemoconcentration secondary to dehydration.  BCx no growth at 24 hours. UCx had insignificant growth. No abx recommended.  - Follow-up on lactic acid  -Follow-up on blood: No growth to date  -Monitor fever curve    Acute encephalopathy/AMS  subacute CVA History of CVA Per neurology, reievewed CTA head and neck. No additional recommendations today.  Continue with DAPT therapy for 3 weeks and monotherapy with Plavix thereafter.  Patient appropriate for discharge.  CTA head and neck showed  chronic poor left PCA flow due to remote infarct. MRI brain: 38mm acute to early subacure ischemic infarct involving the right peripheral cerebellum, additional 4 mm subacute ischemic involving the right frontal cortex, old left PCA infarcts.  ECHO EF 55-60%. Mild LV wall thickness. Grade 1 DD dysfunction.  Lipid:  Chol total 128, TGs 215, HDL 25, LDL 60  BCx: NG at 12 hr  Trops downtrended  - DAPT for 3 weeks, then Plavix alone  -Neurology consulted, appreciate recommendation   Hematemesis, concern for Mallory-Weiss tear: Improved. Resolved hematemesis. Hemoglobin reassuring. Patient with vomiting that progressed to blood-tinged emesis over the past several days, suspect small Mallory-Weiss tear due to frequent retching.   -Continue Protonix daily  -Compazine IV as needed  -Monitor hemoglobin -Consider GI evaluation if clinically declines   Acute on CKDStage 4  Resolved. Creatine 4.24 on admission downtrending to 2.83, Likely prerenal in the setting of dehydration with mild DKA/vomiting. Creatine baseline ~3 in Sept 2020.  Patient follows with [facility].  - s/p 3x 1L NS bolus    HTN  Tachycardic  BP today 178/93. Patient continues to be hypertensive.  Will restart home meds except Clonidine. Tachycardia has resolved. Home medications include 10 mg of amlodipine daily,carvedilol 25 mg twice daily,clonidine 0.2 mg twice daily,hydralazine 10 mg 3 times daily --Restart amlodipine, carvediology and hydralazine.   - Discontinue home Clonidine    Anemia Hemoglobin on admission13.9,MCV 87.6. Home medications include iron supplementation 325 mg daily. FOBT in ED was negative.  -Hold home medication while NPO   GERD Medications  include Protonix 40 mg daily.  Patient given IV pantoprazole in the ED. -Protonnix IV Bid    Concern for alcohol abuse Ethanol <10.  Patient given thiamine 100 mg injection.  Continue daily thiamine. Per mother, patient does not drink  regularly. Will have a drink every 2-3 months.  -Thiamine 100 mg daily  - Follow up vitamin labs   FEN/GI: carb modified diet  PPx: Lovenox  Disposition: pending clearance of acidosis   Subjective:  George [name] had no significant overnight events    Objective: Temp:  [98 F (36.7 C)-100.5 F (38.1 C)] 98 F (36.7 C) (01/15 0903) Pulse Rate:  [74-88] 74 (01/15 0903) Resp:  [12-25] 20 (01/15 0903) BP: (164-184)/(93-113) 178/93 (01/15 0903) SpO2:  [99 %-100 %] 100 % (01/15 0903)  Physical Exam: GEN: alert and conversational, in no acute distress  CV: regular rate and rhythm, no murmurs appreciated RESP: no increased work of breathing, clear to ascultation bilaterally with no crackles, wheezes, or rhonchi  ABD: Bowel sounds present. Soft, Nontender, Nondistended.  MSK: no edema, or cyanosis noted SKIN: warm, dry NEURO: grossly normal, moves all extremities appropriately, alert and oriented  PSYCH: Normal affect and thought content   Laboratory: Recent Labs  Lab 01/11/20 0918 01/11/20 1139 01/11/20 1800  WBC 24.4*  --  25.4*  HGB 13.9 12.6* 11.8*  HCT 40.4 37.0* 34.5*  PLT 312  --  203   Recent Labs  Lab 01/11/20 0918 01/11/20 1139 01/12/20 2244 01/13/20 0225 01/13/20 0616  NA 136   < > 141 137 137  K 4.0   < > 4.0 3.8 4.0  CL 95*   < > 112* 109 109  CO2 17*   < > 23 22 22   BUN 45*   < > 27* 25* 24*  CREATININE 4.24*   < > 3.03* 2.91* 2.83*  CALCIUM 9.2   < > 8.1* 8.0* 8.2*  PROT 7.3  --   --   --   --   BILITOT 1.2  --   --   --   --   ALKPHOS 101  --   --   --   --   ALT 22  --   --   --   --   AST 27  --   --   --   --   GLUCOSE 299*   < > 103* 148* 184*   < > = values in this interval not displayed.     Imaging/Diagnostic Tests: MR ANGIO HEAD WO CONTRAST  Result Date: 01/13/2020 CLINICAL DATA:  Stroke workup EXAM: MRA HEAD WITHOUT CONTRAST TECHNIQUE: Angiographic images of the Circle of Willis were obtained using MRA technique without  intravenous contrast. COMPARISON:  06/14/2019 FINDINGS: Carotid and vertebral arteries are widely patent. The left vertebral artery is dominant. The basilar is smooth and widely patent with minimal flow type artifact along the posterior lumen of the distal basilar. Poor flow in the inferior branch of the left PCA correlating with remote infarct. No acute finding, proximal flow limiting stenosis, or beading to correlate with the new infarcts. IMPRESSION: 1. No emergent finding. 2. Chronic poor left PCA branch flow correlating with remote infarct. Electronically Signed   By: Monte Fantasia M.D.   On: 01/13/2020 09:24   MR ANGIO NECK W WO CONTRAST  Result Date: 01/13/2020 CLINICAL DATA:  Acute infarcts. EXAM: MRA NECK WITHOUT AND WITH CONTRAST TECHNIQUE: Multiplanar and multiecho pulse sequences of the neck were obtained without and with  intravenous contrast. Angiographic images of the neck were obtained using MRA technique without and with intravenous contrast. CONTRAST:  20mL GADAVIST GADOBUTROL 1 MMOL/ML IV SOLN COMPARISON:  None. FINDINGS: Antegrade flow in both carotid and vertebral arteries on time-of-flight imaging. Normal arch. Mild bilateral ICA tortuosity without beading. Vessels are smoothly contoured and diffusely patent in the neck. There is relative mild narrowing at the left ICA bulb, presumably early atherosclerosis. IMPRESSION: 1. No emergent finding or flow limiting stenosis. 2. Mild narrowing at the left ICA bulb. Electronically Signed   By: Monte Fantasia M.D.   On: 01/13/2020 09:26   DG CHEST PORT 1 VIEW  Result Date: 01/12/2020 CLINICAL DATA:  Fever. EXAM: PORTABLE CHEST 1 VIEW COMPARISON:  September 23, 2019. FINDINGS: The heart size and mediastinal contours are within normal limits. Both lungs are clear. The visualized skeletal structures are unremarkable. IMPRESSION: No active disease. Electronically Signed   By: Marijo Conception M.D.   On: 01/12/2020 12:37   ECHOCARDIOGRAM  COMPLETE  Result Date: 01/12/2020   ECHOCARDIOGRAM REPORT   Patient Name:   FREEMON REATH Date of Exam: 01/12/2020 Medical Rec #:  [id number]        Height:       63.0 in Accession #:    [id number]       Weight:       125.0 lb Date of Birth:  [birth year]        BSA:          1.58 m Patient Age:    86 years         BP:           159/94 mmHg Patient Gender: M                HR:           108 bpm. Exam Location:  Inpatient Procedure: 2D Echo Indications:    Stroke 434.91/I163.9  History:        Patient has prior history of Echocardiogram examinations, most                 recent 06/20/2019. Risk Factors:Diabetes and Hypertension. CKD.  Sonographer:    Clayton Lefort RDCS (AE) Referring Phys: [id number] [place]  1. Left ventricular ejection fraction, by visual estimation, is 55 to 60%. The left ventricle has normal function. Left ventricular septal wall thickness was mildly increased. Mildly increased left ventricular posterior wall thickness. There is mildly increased left ventricular hypertrophy.  2. Left ventricular diastolic parameters are consistent with Grade I diastolic dysfunction (impaired relaxation).  3. The left ventricle has no regional wall motion abnormalities.  4. Global right ventricle has normal systolic function.The right ventricular size is normal. No increase in right ventricular wall thickness.  5. Left atrial size was mildly dilated.  6. Right atrial size was normal.  7. The mitral valve is normal in structure. Mild mitral valve regurgitation. No evidence of mitral stenosis.  8. The tricuspid valve is normal in structure.  9. The aortic valve is tricuspid. Aortic valve regurgitation is not visualized. No evidence of aortic valve sclerosis or stenosis. 10. The pulmonic valve was normal in structure. Pulmonic valve regurgitation is not visualized. 11. TR signal is inadequate for assessing pulmonary artery systolic pressure. 12. The inferior vena cava is normal in size with greater than 50%  respiratory variability, suggesting right atrial pressure of 3 mmHg. FINDINGS  Left Ventricle: Left ventricular ejection fraction, by visual estimation, is 55 to 60%.  The left ventricle has normal function. The left ventricle has no regional wall motion abnormalities. Mildly increased left ventricular posterior wall thickness. There is mildly increased left ventricular hypertrophy. Left ventricular diastolic parameters are consistent with Grade I diastolic dysfunction (impaired relaxation). Normal left atrial pressure. Right Ventricle: The right ventricular size is normal. No increase in right ventricular wall thickness. Global RV systolic function is has normal systolic function. Left Atrium: Left atrial size was mildly dilated. Right Atrium: Right atrial size was normal in size Pericardium: There is no evidence of pericardial effusion. Mitral Valve: The mitral valve is normal in structure. Mild mitral valve regurgitation. No evidence of mitral valve stenosis by observation. Tricuspid Valve: The tricuspid valve is normal in structure. Tricuspid valve regurgitation is trivial. Aortic Valve: The aortic valve is tricuspid. Aortic valve regurgitation is not visualized. The aortic valve is structurally normal, with no evidence of sclerosis or stenosis. Aortic valve mean gradient measures 5.0 mmHg. Aortic valve peak gradient measures 7.5 mmHg. Aortic valve area, by VTI measures 2.68 cm. Pulmonic Valve: The pulmonic valve was normal in structure. Pulmonic valve regurgitation is not visualized. Pulmonic regurgitation is not visualized. Aorta: The aortic root, ascending aorta and aortic arch are all structurally normal, with no evidence of dilitation or obstruction. Venous: The inferior vena cava is normal in size with greater than 50% respiratory variability, suggesting right atrial pressure of 3 mmHg. IAS/Shunts: No atrial level shunt detected by color flow Doppler. There is no evidence of a patent foramen ovale. No  ventricular septal defect is seen or detected. There is no evidence of an atrial septal defect.  LEFT VENTRICLE PLAX 2D LVIDd:         4.64 cm  Diastology LVIDs:         3.32 cm  LV e' lateral: 11.30 cm/s LV PW:         1.28 cm LV IVS:        1.31 cm LVOT diam:     2.10 cm LV SV:         55 ml LV SV Index:   34.28 LVOT Area:     3.46 cm  RIGHT VENTRICLE          IVC RV Basal diam:  2.32 cm  IVC diam: 1.74 cm TAPSE (M-mode): 2.1 cm LEFT ATRIUM             Index       RIGHT ATRIUM           Index LA diam:        2.60 cm 1.64 cm/m  RA Area:     13.20 cm LA Vol (A2C):   47.8 ml 30.19 ml/m RA Volume:   31.80 ml  20.08 ml/m LA Vol (A4C):   40.8 ml 25.77 ml/m LA Biplane Vol: 47.0 ml 29.68 ml/m  AORTIC VALVE AV Area (Vmax):    2.52 cm AV Area (Vmean):   2.12 cm AV Area (VTI):     2.68 cm AV Vmax:           137.00 cm/s AV Vmean:          109.000 cm/s AV VTI:            0.284 m AV Peak Grad:      7.5 mmHg AV Mean Grad:      5.0 mmHg LVOT Vmax:         99.80 cm/s LVOT Vmean:        66.600 cm/s LVOT  VTI:          0.220 m LVOT/AV VTI ratio: 0.77  AORTA Ao Root diam: 2.80 cm Ao Asc diam:  2.90 cm  SHUNTS Systemic VTI:  0.22 m Systemic Diam: 2.10 cm  Skeet Latch MD Electronically signed by Skeet Latch MD Signature Date/Time: 01/12/2020/2:02:47 PM    Final      Lyndee Hensen, DO 01/13/2020, 9:49 AM PGY-1, [place] Intern pager: [phone], text pages welcome

## 2020-01-16 ENCOUNTER — Telehealth: Payer: Self-pay

## 2020-01-16 LAB — CULTURE, BLOOD (ROUTINE X 2)
Culture: NO GROWTH
Culture: NO GROWTH
Special Requests: ADEQUATE
Special Requests: ADEQUATE

## 2020-01-16 NOTE — Telephone Encounter (Signed)
Transition Care Management Follow-up Telephone Call  Call completed with patient's mother, Harriett.  DPR on file to speak with her.    Date of discharge and from where: 01/13/2020, [facility]   How have you been since you were released from the hospital? His mother reported that he is tired but doing well. Nausea has improved. She said that pharmacist recommended dramamine for the nausea and that has helped him and he is starting to eat again.   Any questions or concerns? No questions/ concerns at this time   Items Reviewed:  Did the pt receive and understand the discharge instructions provided? She has the instructions and has no questions.   Medications obtained and verified? yes, he has all medications including the new meds.  His mother is aware of the medications that have been discontinued.  She said that Saturday, 01/14/2020 he was " wiped out" and he attributed that to the lantus so yesterday, he split the lantus dose and took 7.5 units in the morning and 7.5 units in the evening.  She does realize that he may just be tired as a result of the hospitalization.   Any new allergies since your discharge? none reported   Do you have support at home? Lives with his parents  Other (ie: DME, Home Health, etc) no home health ordered.   Has glucometer, his mother said that his blood sugars usually run in  the low 200s  Functional Questionnaire: (I = Independent and D = Dependent) ADL's: independent, mother helps oversee medication regime   Follow up appointments reviewed:    PCP Hospital f/u appt confirmed? Appointment scheduled with Dr Joya Gaskins 01/23/2020 @ [facility] f/u appt confirmed? Nephrology- 01/25/2020, mother will call to schedule endocrinology and neurology appts  Are transportation arrangements needed? No, parents provide transportation  If their condition worsens, is the pt aware to call  their PCP or go to the ED?yes  Was the patient provided  with contact information for the PCP's office or ED? Yes. His mother has the phone number for the clinic  Was the pt encouraged to call back with questions or concerns?yes

## 2020-01-17 LAB — VITAMIN B1: Vitamin B1 (Thiamine): 331.4 nmol/L — ABNORMAL HIGH (ref 66.5–200.0)

## 2020-01-22 NOTE — Progress Notes (Signed)
Subjective:    Patient ID: George [name], male    DOB: [birth year], 34 y.o.   MRN: [id number] History of Present Illness: This is a 34 year old male has had previous history of cerebral infarction, type 1 diabetes, hypertension, tobacco use, supraventricular tachycardia, chronic kidney disease.  Since the last visit the patient has improved somewhat.  Labs did come back showing normal thyroid function and cholesterol at goal.  The patient's blood sugars have been reasonably well-controlled running fasting levels in the 1 20-1 60 range.  Note today the CBG is 180 blood pressure has come down but still not yet at goal today 160/112  The patient has yet to see speech pathology for his aphasia.  The barrier there was the patient did not have insurance and I indicated to the scheduler the patient can be self-pay  The patient has been able to reduce tobacco use down to 3 to 5 cigarettes daily and has yet to pick up the nicotine lozenges   09/19/2019 This is a follow-up visit for this 34 year old male with type 1 diabetes, hypertension, tobacco use, chronic kidney disease, and previous history of cerebral infarction.  The patient states apart from some fatigue he is doing fairly well.  He is now smoking 2 packs of cigarettes a week.  His blood pressure at home ranges been 130-135/80-85 range.  His blood sugars have been in the 1 20-1 50s with average is 1 30-1 40 on his meter.  His sugar will go down as low as 60s before dinner.  The patient does have history of receptive a aphasia and notes when he is playing his video games he will remember some names but not all the names of the characters in the game.  He has difficulty with memory retrieval.  He saw speech pathology 1 time and has not yet made a follow-up visit.    Patient did see nephrology and has a follow-up visit pending.  At the last visit the patient not receive any labs.  The patient denies any chest pain or shortness of  breath.  12/21: Since the last visit the patient was hospitalized 4 days after our visit in September.  The patient had diabetic ketoacidosis and also new areas of stroke.  Below is the discharge summary from that hospitalization  [name] a 33 y.o.malewith medical history significant oftype 1 diabetes, hypertension, recent CVA in June with combined receptive and expressive aphasia, CKD stage III who presented here with altered mental status and reportedly has been having nausea and persistent vomiting since Wednesday. Reportedly from EDP patient was initially able to give some history but acutely became more agitated and was pulling out his IV. He was tachycardic up to 160s and had improvement of heart rate down to 120s with 10 mg of verapamil. He was also tachypneic but remained on room air. Patient was restless and agitated at the time my evaluation and per nursing report he had attempted to use his foot to remove his hand restraints. He repeatedly stated "please help me," but was unable to explain any of his symptoms to me. He was pan positive and replied yes to every review of systems questions. Also stating he has pain everywhere. When asked for his full name and where he was he only stated "[place]."  He was afebrile and hypertensive up to 170s/108. In sinus tachycardia up to 160 and mildly tachypneic on room air. CBC showed leukocytosis of 29.6 and no anemia. CMP showed glucose level 532 with  bicarb of 18 and anion gap of 24. pH was mildly alkalotic at 7.59. Lactic acid of 5 and then 3.2 following IV fluids. TSH normal. COVID testing negative. CT head shows 2 or 3 rounded oval regions of low attenuation in the cerebral hemisphere not seen on previous studies. Suggest could be small interval lacunar infarcts with the possibility of embolic disease. Findings were age-indeterminate. CT abdomen showed no acute findings. There is gallbladder sludge or stones but no findings  of acute cholecystitis.  He was seen for nausea, vomiting, and altered mental status.  He was found to be in DKA.  He improved with insulin gtt and was transitioned to his home regimen.  He had head imaging and was found to have new strokes.  He was seen by neurology who recommended DAPT x 21 days then plavix alone.  Hospitalization also c/b AKI on CKD improving at time of discharge.  Hospital Course:  Diabetic ketoacidosis  T1DM Hypoglycemia - Unclear precipitating cause.  - He's had borderline temps, tmax of 100.2 since admission, but clinically appears to be improving and UA and CXR not suggestive of infection - follow blood cx (gram positive rods in 1/2 blood cx - repeat cx from 9/27 ngtd)  and follow urine cx (urine did have WBC, but negative LE/nitrites - no growth) - hold off on abx for now - DKA now resolved, AG 13, bicarb 22, BG173 - resumehome insulin at reduced dose 8units NPH with 3 units at meals and SSI(he's had some hypoglycemia here and he notes some at home as well - discussed importance of f/u with Dr. Joya [name] and monitoring BG at home) - Diabetic coordinator - A1c is 7.3 - check HbA1C  Altered mental status/delirium -delirium precautions - utox positive for MJ -resolved  Acute Infarct R Parietal Cortex  Subacute Infarct R Cerebellum  Hx CVA: -Hx L PCA infarct in June - Was on ASA/plavix and supposed to stop plavix and continue ASA alone after 9/19 (admitting provider started on plavix- have added aspirin, will deferfinal planto neuro) - Thought 2/2 cardioembolic source -> they'd planned for 30 day event monitor as echo and TEE were negative for PFO or idenifiable cardiac source of embolus and hypercoagulable/vasculitis w/u was negative per recent neuro note - will reconsult neurology in setting of acute stroke, appreciate recs- recurrent cryptogenic strokes - recommend ASA and plavix x3 weeks then plavix alone.  Of note, discharged with 81 mg ASA with  plavix, but on review of neuro note - intended to be 325 mg aspirin with plavix x21 days then plavix alone.  Called mother to correct this after discharge - she expressed understanding.  Hypertension  Tachycardia Significantly elevated BP's this AM His clonidine and coreg were being held -> which likely contributed to rebound hypertension and tachycardia Resume clonidine and coreg and amlodipine - BP still significantly elevated - will add hydralazine at d/c to help gradually normalize BP after stroke - needs close follow up with Dr. Joya [name] EKG with sinus tachycardia  Acute kidney injury -Creatinine of 4.11 on admission - improved to 3.02 today - good UOP - baseline creatinine ~2.2-2.4 - renal US without hydro, distended bladder noted(per nursing 9/26, bladder scan showed around 500 cc, then he voided as much) - creatinine improving - follow with nephrology as outpatient -Continue to monitor serial BMP -Avoidnephrotoxic agent  Leukocytosis: improving, follow, follow cx- given sludge vs stone in gallbladder on imaging, will follow RUQ [place] as he did have N/V on presentation - RUQ  wnl  Gram Positive Rods in 1/2 Cx: suspect contaminant, improving off abx - follow repeat cx (NGTD)  In their interim the patient's had no additional episodes of fever or DKA.  His glycemic control has been such that he occasionally runs in the 70 range early in the morning but will rebound over 200 late in the day.  It is been difficult to control with the use of long-acting NPH twice daily 8 units in the morning 12 units in the evening at meals and then associate that with short acting Humalog  The patient has since followed up with cardiology with an event monitor showing no evidence of arrhythmia therefore no evidence for embolic event.  Neurology has seen the patient and recommended switch from atorvastatin to Crestor discontinuance of a full dose aspirin after 21 days and continuing Plavix for  life  Blood pressure control continue with amlodipine clonidine and Coreg The patient also has follow-up with [facility] scheduled creatinine is in the 3 range Also an ophthalmology visit was obtained and there is apparently blood in the eyes with diabetic retinopathy  The patient does need a retinal eye referral  1/25  Since the last visit in December the patient required another admission for recurrent stroke in the cerebellar area.  The patient had ongoing nausea and went back into diabetic ketoacidosis as well.  Below is a copy of the discharge summary.  There is a follow-up appointment with nephrology upcoming and the endocrinology appointment but out until September therefore we need to intervene on that to get an earlier appointment  Also neurology appointment is pending from the hospitalization  Below is a copy of the discharge summary  Note this is a transition of care visit and below the discharge summary is the nurse case manager note    d/c summar y: Date of Admission: 01/11/2020                      Date of Discharge: 01/13/20  Admitting Physician: Lyndee Hensen, DO  Primary Care Provider: Elsie Stain, MD Consultants: Neurology   Indication for Hospitalization: DKA  Discharge Diagnoses/Problem List:  DKA in T1DM Fever Hematemesis Non-intractable nausea and vomiting Subacute CVA Acute on chronic CKD stage IV Anemia GERD  Disposition: Home   Discharge Condition: Improved   Discharge Exam:   BMW:UXLKG and conversational, in no acute distress CV: regular rate and rhythm, no murmurs appreciated RESP: no increased work of breathing, clear to ascultation bilaterally with no crackles, wheezes, or rhonchi  ABD: Bowel sounds present. Soft, Nontender, Nondistended.  MSK: no edema, or cyanosis noted SKIN: warm, dry NEURO: grossly normal, moves all extremities appropriately, alert and oriented PSYCH:Normal affect and thought content  Brief  Hospital Course:   Type 1 diabetes  nausea vomiting  DKA  hematemesis Trip Cavanagh is a 34 y.o. male presented after 3 days of vomiting that progressed to bloody emesis which prompted patient to seek care.  Mom reported patient had CBGs ranging 120-320.  On arrival, patient tachycardic (HR120s) and hypertensive (SBP 170s).  Labs concerning for mild DKA with BHB 3.73,CBG300,bicarb17, pH 7.4with elevated anion gap of 24.  He was given 2 L bolus in the ED. DKA protocol initiated and patient started on insulin drip and IV potassium.    Patient was transitioned to SQ insulin on 01/12/20.  Etiology of DKA unclear however patient did have of subacute stroke on CT head which could have been the precipitating factor for his  DKA. Overall, Pt's diabetes is controlled as A1c was 7.2.  Mom and patient wanted to start the hospital regimen of Lantus and NovoLog in the outpatient setting.  Patient was discharged with 15 Lantus and 3 units (Novolog) mealtime coverage.  Patient has a pending referral for [facility] endocrinology.   Acute encephalopathy   subacute CVA George [name] had inappropriate answers to orientation questions (What year is it "8th",Whats your name "august 12th",Where are you " working on it") thus CT head was obtained.  CT head indicated 2 cm subacute infarct in the cerebellum that was not present on previous imaging.  Patient with history of 2 ischemic strokes in the past 6 months.  Neurology was consulted and recommended dual antiplatelet therapy for the next 3 weeks followed by monotherapy with Plavix thereafter.  MRA head and neck obtained did not show any emergent findings of flow limiting stenosis.  Patient to follow-up with stroke clinic at [facility] neurology patient in 4 weeks.    Hematemesis Patient with vomiting that progressed to blood-tinged emesis over the past several days.  Suspect small Mallory-Weiss tear due to frequent retching.  For GI prophylaxis, Protonix infusion  initiated in the ED.   Hematemesis resolved on day 1 of admission.  Hemoglobin remained reassuring throughout his admission. He was transitioned to oral Protonix prior to discharge.   Hypertension Due to persistent vomiting, patient was unable to take antihypertensives.  On arrival SBP's were in the 170s.  Permissive hypertension was allowed for 72 hours for subacute stroke.    Acute on chronic CKD stage IV Creatinine on admission 4.24, likely prerenal in the setting of dehydration due to vomiting.  Creatinine returned to baseline prior to discharge.  Patient to follow up outpatient with [facility].  Fever Patient had isolated fever of 100.5 F on 01/12/20.  Leukocytosis (WBC 25.4) likely due to leukemoid reaction secondary to DKA or hemoconcentration secondary to dehydration.  Blood cultures have no growth to date and urine cultures had insignificant growth.  Lactic acid was reassuring at 0.7.  No empiric antibiotics were started.   Issues for Follow Up:  1. Continue titrating patient's basal (Lantis) and short-acting insulin (Novolog).  2. Patient with permissive hypertension for the next few days.  We discontinued Clonidine. If needed, consider clonidine patch instead tablets. He should resume all other antihypertensive medications. 3. UDS was positive for THC.  Continue talking with the patient about the risk of THC use and stroke.  TCM note from CM RN: Note  Transition Care Management Follow-up Telephone Call  Call completed with patient's mother, George [name].  DPR on file to speak with her.    Date of discharge and from where: 01/13/2020, [facility]   How have you been since you were released from the hospital? His mother reported that he is tired but doing well. Nausea has improved. She said that pharmacist recommended dramamine for the nausea and that has helped him and he is starting to eat again.   Any questions or concerns? No questions/ concerns at this time    Items Reviewed:  Did the pt receive and understand the discharge instructions provided? She has the instructions and has no questions.   Medications obtained and verified? yes, he has all medications including the new meds.  His mother is aware of the medications that have been discontinued.  She said that Saturday, 01/14/2020 he was " wiped out" and he attributed that to the lantus so yesterday, he split the lantus dose and took  7.5 units in the morning and 7.5 units in the evening.  She does realize that he may just be tired as a result of the hospitalization.   Any new allergies since your discharge? none reported   Do you have support at home? Lives with his parents  Other (ie: DME, Home Health, etc) no home health ordered.   Has glucometer, his mother said that his blood sugars usually run in  the low 200s  Functional Questionnaire: (I = Independent and D = Dependent) ADL's: independent, mother helps oversee medication regime   Follow up appointments reviewed:    PCP Hospital f/u appt confirmed? Appointment scheduled with Dr George [name] 01/23/2020 @ [facility] f/u appt confirmed? Nephrology- 01/25/2020, mother will call to schedule endocrinology and neurology appts  Are transportation arrangements needed? No, parents provide transportation  If their condition worsens, is the pt aware to call  their PCP or go to the ED?yes  Was the patient provided with contact information for the PCP's office or ED? Yes. His mother has the phone number for the clinic  Was the pt encouraged to call back with questions or concerns?yes  Since discharge the patient knows he does have a nephrology appointment coming up on 27 January but is still pending endocrine and neurology.  He states his nausea has resolved.  He also has yet to achieve a retinal appointment with Dr. Zigmund Daniel.  In looking at the patient's medication regimen it appears the NPH dosing he was on failed the patient  and he will need more consistent insulin dosing.  Note since discharge the patient has been on the Lantus dosing and this appears to have caused improved control of the diabetes and he is on a 15 unit daily dosing of this and 5 units 3 times daily of NovoLog at meals   Past Medical History:  Diagnosis Date  . AKI (acute kidney injury) ([place])   . Cerebral infarction ([place])   . Hypertension   . Neurologic deficit due to acute ischemic cerebrovascular accident (CVA) ([place]) 06/13/2019  . Supraventricular tachycardia ([place])   . Type 1 diabetes (H[facility])      Family History  Problem Relation Age of Onset  . Hypertension Mother   . Diabetes Mother   . Hyperlipidemia Mother   . Diabetes Father      Social History   Socioeconomic History  . Marital status: Single    Spouse name: Not on file  . Number of children: Not on file  . Years of education: Not on file  . Highest education level: Not on file  Occupational History  . Not on file  Tobacco Use  . Smoking status: Current Every Day Smoker    Packs/day: 0.25    Types: Cigarettes    Start date: 12/29/2010  . Smokeless tobacco: Never Used  Substance and Sexual Activity  . Alcohol use: Not on file    Comment: 6 beers/week  . Drug use: Not Currently  . Sexual activity: Not Currently  Other Topics Concern  . Not on file  Social History Narrative  . Not on file   Social Determinants of Health   Financial Resource Strain:   . Difficulty of Paying Living Expenses: Not on file  Food Insecurity:   . Worried About Charity fundraiser in the Last Year: Not on file  . Ran Out of Food in the Last Year: Not on file  Transportation Needs:   . Lack of Transportation (Medical): Not on  file  . Lack of Transportation (Non-Medical): Not on file  Physical Activity:   . Days of Exercise per Week: Not on file  . Minutes of Exercise per Session: Not on file  Stress:   . Feeling of Stress : Not on file  Social Connections:   . Frequency of  Communication with Friends and Family: Not on file  . Frequency of Social Gatherings with Friends and Family: Not on file  . Attends Religious Services: Not on file  . Active Member of Clubs or Organizations: Not on file  . Attends Archivist Meetings: Not on file  . Marital Status: Not on file  Intimate Partner Violence:   . Fear of Current or Ex-Partner: Not on file  . Emotionally Abused: Not on file  . Physically Abused: Not on file  . Sexually Abused: Not on file     No Known Allergies   Outpatient Medications Prior to Visit  Medication Sig Dispense Refill  . amLODipine (NORVASC) 10 MG tablet Take 1 tablet (10 mg total) by mouth daily. 30 tablet 5  . aspirin EC 81 MG EC tablet Take 1 tablet (81 mg total) by mouth daily for 21 days. 20 tablet 0  . carvedilol (COREG) 25 MG tablet Take 1 tablet (25 mg total) by mouth 2 (two) times daily with a meal. 180 tablet 1  . clopidogrel (PLAVIX) 75 MG tablet Take 1 tablet (75 mg total) by mouth daily. 30 tablet 2  . ferrous sulfate 325 (65 FE) MG tablet Take 1 tablet (325 mg total) by mouth daily with breakfast. 30 tablet 6  . glucose blood (ONETOUCH VERIO) test strip Use as instructed to check blood sugar 3 times daily. 100 each 11  . hydrALAZINE (APRESOLINE) 10 MG tablet Take 1 tablet (10 mg total) by mouth every 8 (eight) hours. 90 tablet 2  . pantoprazole (PROTONIX) 40 MG tablet Take 1 tablet (40 mg total) by mouth daily. For stomach protection 30 tablet 11  . rosuvastatin (CRESTOR) 20 MG tablet Take 1 tablet (20 mg total) by mouth daily. 30 tablet 4  . insulin aspart (NOVOLOG FLEXPEN) 100 UNIT/ML FlexPen Inject 2 Units into the skin 3 (three) times daily with meals. (Patient taking differently: Inject 5 Units into the skin 3 (three) times daily with meals. ) 3 mL 0  . Insulin Pen Needle (PEN NEEDLES 3/16") 31G X 5 MM MISC To inject insulin with novolog flex pen, 3 times daily with meals. 100 each 1  . acetaminophen (TYLENOL) 325 MG  tablet Take 650 mg by mouth every 6 (six) hours as needed for headache.    . insulin glargine (LANTUS) 100 UNIT/ML injection Inject 0.15 mLs (15 Units total) into the skin daily. 10 mL 0   No facility-administered medications prior to visit.    Review of Systems  Constitutional: Negative for activity change, appetite change, chills, diaphoresis, fatigue, fever and unexpected weight change.  HENT: Negative.   Eyes: Negative for visual disturbance.  Respiratory: Negative.   Cardiovascular: Negative.  Negative for palpitations.  Gastrointestinal: Negative for constipation.  Endocrine: Negative.   Genitourinary: Negative.   Musculoskeletal: Negative.   Neurological: Negative for dizziness, tremors, seizures, syncope, facial asymmetry, speech difficulty, light-headedness, numbness and headaches.  Hematological: Negative for adenopathy. Does not bruise/bleed easily.  Psychiatric/Behavioral: Negative.        Objective:   Physical Exam Vitals:   01/23/20 1058  BP: (!) 152/98  Pulse: (!) 105  Resp: 16  Temp: 98.7 F (  37.1 C)  TempSrc: Oral  SpO2: 98%  Weight: 122 lb (55.3 kg)  Height: 5\' 4"  (1.626 m)   Gen: Pleasant, well-nourished, in no distress,  normal affect  ENT: No lesions,  mouth clear,  oropharynx clear, no postnasal drip  Neck: No JVD, no TMG, no carotid bruits  Lungs: No use of accessory muscles, no dullness to percussion, clear without rales or rhonchi  Cardiovascular: RRR, heart sounds normal, no murmur or gallops, no peripheral edema  Abdomen: soft and NT, no HSM,  BS normal  Musculoskeletal: No deformities, no cyanosis or clubbing  Neuro: alert, non focal  Skin: Warm, no lesions or rashes   BMP Latest Ref Rng & Units 01/13/2020 01/13/2020 01/12/2020  Glucose 70 - 99 mg/dL 184(H) 148(H) 103(H)  BUN 6 - 20 mg/dL 24(H) 25(H) 27(H)  Creatinine 0.61 - 1.24 mg/dL 2.83(H) 2.91(H) 3.03(H)  BUN/Creat Ratio 9 - 20 - - -  Sodium 135 - 145 mmol/L 137 137 141   Potassium 3.5 - 5.1 mmol/L 4.0 3.8 4.0  Chloride 98 - 111 mmol/L 109 109 112(H)  CO2 22 - 32 mmol/L 22 22 23   Calcium 8.9 - 10.3 mg/dL 8.2(L) 8.0(L) 8.1(L)   CBC Latest Ref Rng & Units 01/11/2020 01/11/2020 01/11/2020  WBC 4.0 - 10.5 K/uL 25.4(H) - 24.4(H)  Hemoglobin 13.0 - 17.0 g/dL 11.8(L) 12.6(L) 13.9  Hematocrit 39.0 - 52.0 % 34.5(L) 37.0(L) 40.4  Platelets 150 - 400 K/uL 203 - 312       Assessment & Plan:  I personally reviewed all images and lab data in the [facility] system as well as any outside material available during this office visit and agree with the  radiology impressions.   Cerebrovascular accident (CVA) ([place]) Recurrent stroke with findings in the cerebellar area  Plan will be to continue Plavix 75 mg daily and continue aspirin 81 mg daily for a total of 3 weeks  We did achieve a follow-up appoint with neurology on February 16  HTN (hypertension) Hypertension with fair control will continue Coreg 25 mg twice daily, amlodipine 10 mg daily, and hydralazine 10 mg every 8 hours may need further dose adjustments as blood pressure today was 152/98  Poorly controlled type 1 diabetes mellitus ([place]) Poor control of type 1 diabetes and the patient may be a candidate for insulin pump  In the interim will increase Lantus to 17 units daily and 5 units 3 times daily of NovoLog  I will work on getting a sooner appointment than September for this patient for endocrinology  Proliferative diabetic retinopathy associated with type 1 diabetes mellitus ([place]) Proliferative diabetic neuropathy associated with type 1 diabetes will work on getting a retinal appointment  Acute renal failure superimposed on stage 4 chronic kidney disease ([place]) Acute on chronic renal failure stage IV renal disease  Plan will be to follow-up with nephrology with appointment on January 27  Tobacco use disorder Further smoking cessation counseling was given to the patient at this visit   [place] was seen  today for hospitalization follow-up and diabetes.  Diagnoses and all orders for this visit:  Type 1 diabetes mellitus with stage 3 chronic kidney disease, unspecified whether stage 3a or 3b CKD (HCC) -     POCT glucose (manual entry) -     Comprehensive metabolic panel -     CBC with Differential/Platelet; Future -     CBC with Differential/Platelet  Essential hypertension -     Comprehensive metabolic panel -  CBC with Differential/Platelet; Future -     CBC with Differential/Platelet  Acute renal failure with acute renal cortical necrosis superimposed on stage 4 chronic kidney disease (HCC) -     CBC with Differential/Platelet; Future -     CBC with Differential/Platelet  Poorly controlled type 1 diabetes mellitus ([place])  Cerebrovascular accident (CVA) due to embolism of right cerebellar artery ([place])  Hematemesis with nausea  Hyperlipidemia due to type 1 diabetes mellitus ([place])  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)  Diabetic ketoacidosis without coma associated with type 1 diabetes mellitus ([place])  Tobacco use disorder  Proliferative diabetic retinopathy associated with type 1 diabetes mellitus, unspecified laterality, unspecified proliferative retinopathy type ([place])  Other orders -     insulin glargine (LANTUS) 100 UNIT/ML injection; Inject 0.17 mLs (17 Units total) into the skin daily. -     insulin aspart (NOVOLOG FLEXPEN) 100 UNIT/ML FlexPen; Inject 5 Units into the skin 3 (three) times daily with meals. -     Insulin Pen Needle (PEN NEEDLES 3/16") 31G X 5 MM MISC; To inject insulin with novolog flex pen, 3 times daily with meals.    We will be following up CBC and metabolic panel at this visit  We will endeavor to move up the patient's endocrine appointment and also get the patient connected with retinal specialist  This was a complex visit requiring multiple disciplines including renal, neurology,  endocrinology, and cardiology with the patient's hypertension  Also required 30 minutes reviewing the patient's records  This is a high risk patient is at risk for readmissions

## 2020-01-23 ENCOUNTER — Telehealth: Payer: Self-pay

## 2020-01-23 ENCOUNTER — Telehealth: Payer: Self-pay | Admitting: Internal Medicine

## 2020-01-23 ENCOUNTER — Other Ambulatory Visit: Payer: Self-pay

## 2020-01-23 ENCOUNTER — Ambulatory Visit: Payer: Medicaid Other | Attending: Critical Care Medicine | Admitting: Critical Care Medicine

## 2020-01-23 ENCOUNTER — Encounter: Payer: Self-pay | Admitting: Critical Care Medicine

## 2020-01-23 VITALS — BP 152/98 | HR 105 | Temp 98.7°F | Resp 16 | Ht 64.0 in | Wt 122.0 lb

## 2020-01-23 DIAGNOSIS — E785 Hyperlipidemia, unspecified: Secondary | ICD-10-CM | POA: Insufficient documentation

## 2020-01-23 DIAGNOSIS — Z79899 Other long term (current) drug therapy: Secondary | ICD-10-CM | POA: Diagnosis not present

## 2020-01-23 DIAGNOSIS — E103599 Type 1 diabetes mellitus with proliferative diabetic retinopathy without macular edema, unspecified eye: Secondary | ICD-10-CM | POA: Diagnosis not present

## 2020-01-23 DIAGNOSIS — Z8249 Family history of ischemic heart disease and other diseases of the circulatory system: Secondary | ICD-10-CM | POA: Insufficient documentation

## 2020-01-23 DIAGNOSIS — I69320 Aphasia following cerebral infarction: Secondary | ICD-10-CM

## 2020-01-23 DIAGNOSIS — N184 Chronic kidney disease, stage 4 (severe): Secondary | ICD-10-CM | POA: Insufficient documentation

## 2020-01-23 DIAGNOSIS — E1065 Type 1 diabetes mellitus with hyperglycemia: Secondary | ICD-10-CM

## 2020-01-23 DIAGNOSIS — Z794 Long term (current) use of insulin: Secondary | ICD-10-CM | POA: Insufficient documentation

## 2020-01-23 DIAGNOSIS — Z833 Family history of diabetes mellitus: Secondary | ICD-10-CM | POA: Diagnosis not present

## 2020-01-23 DIAGNOSIS — F17200 Nicotine dependence, unspecified, uncomplicated: Secondary | ICD-10-CM

## 2020-01-23 DIAGNOSIS — K920 Hematemesis: Secondary | ICD-10-CM

## 2020-01-23 DIAGNOSIS — I129 Hypertensive chronic kidney disease with stage 1 through stage 4 chronic kidney disease, or unspecified chronic kidney disease: Secondary | ICD-10-CM | POA: Diagnosis not present

## 2020-01-23 DIAGNOSIS — E1022 Type 1 diabetes mellitus with diabetic chronic kidney disease: Secondary | ICD-10-CM | POA: Diagnosis not present

## 2020-01-23 DIAGNOSIS — I10 Essential (primary) hypertension: Secondary | ICD-10-CM

## 2020-01-23 DIAGNOSIS — E1010 Type 1 diabetes mellitus with ketoacidosis without coma: Secondary | ICD-10-CM

## 2020-01-23 DIAGNOSIS — Z7902 Long term (current) use of antithrombotics/antiplatelets: Secondary | ICD-10-CM | POA: Insufficient documentation

## 2020-01-23 DIAGNOSIS — E1069 Type 1 diabetes mellitus with other specified complication: Secondary | ICD-10-CM | POA: Diagnosis not present

## 2020-01-23 DIAGNOSIS — K219 Gastro-esophageal reflux disease without esophagitis: Secondary | ICD-10-CM | POA: Insufficient documentation

## 2020-01-23 DIAGNOSIS — Z8349 Family history of other endocrine, nutritional and metabolic diseases: Secondary | ICD-10-CM | POA: Insufficient documentation

## 2020-01-23 DIAGNOSIS — N183 Chronic kidney disease, stage 3 unspecified: Secondary | ICD-10-CM | POA: Diagnosis not present

## 2020-01-23 DIAGNOSIS — I63441 Cerebral infarction due to embolism of right cerebellar artery: Secondary | ICD-10-CM

## 2020-01-23 DIAGNOSIS — N171 Acute kidney failure with acute cortical necrosis: Secondary | ICD-10-CM | POA: Diagnosis not present

## 2020-01-23 DIAGNOSIS — F17210 Nicotine dependence, cigarettes, uncomplicated: Secondary | ICD-10-CM | POA: Insufficient documentation

## 2020-01-23 DIAGNOSIS — Z7982 Long term (current) use of aspirin: Secondary | ICD-10-CM | POA: Insufficient documentation

## 2020-01-23 LAB — GLUCOSE, POCT (MANUAL RESULT ENTRY): POC Glucose: 119 mg/dl — AB (ref 70–99)

## 2020-01-23 MED ORDER — INSULIN GLARGINE 100 UNIT/ML ~~LOC~~ SOLN: 17.0000 [IU] | SUBCUTANEOUS | 0 refills | Status: DC

## 2020-01-23 MED ORDER — "PEN NEEDLES 3/16"" 31G X 5 MM MISC": 1 refills | Status: DC

## 2020-01-23 MED ORDER — NOVOLOG FLEXPEN 100 UNIT/ML ~~LOC~~ SOPN: 5.0000 [IU] | PEN_INJECTOR | Freq: Three times a day (TID) | SUBCUTANEOUS | 3 refills | Status: DC

## 2020-01-23 NOTE — Assessment & Plan Note (Signed)
Poor control of type 1 diabetes and the patient may be a candidate for insulin pump  In the interim will increase Lantus to 17 units daily and 5 units 3 times daily of NovoLog  I will work on getting a sooner appointment than September for this patient for endocrinology

## 2020-01-23 NOTE — Telephone Encounter (Signed)
Please review and advise.

## 2020-01-23 NOTE — Assessment & Plan Note (Signed)
Proliferative diabetic neuropathy associated with type 1 diabetes will work on getting a retinal appointment

## 2020-01-23 NOTE — Assessment & Plan Note (Signed)
Acute on chronic renal failure stage IV renal disease  Plan will be to follow-up with nephrology with appointment on January 27

## 2020-01-23 NOTE — Progress Notes (Signed)
Hospital F /u  BP manual 152/98 left arm

## 2020-01-23 NOTE — Assessment & Plan Note (Signed)
Recurrent stroke with findings in the cerebellar area  Plan will be to continue Plavix 75 mg daily and continue aspirin 81 mg daily for a total of 3 weeks  We did achieve a follow-up appoint with neurology on February 16

## 2020-01-23 NOTE — Assessment & Plan Note (Signed)
Hypertension with fair control will continue Coreg 25 mg twice daily, amlodipine 10 mg daily, and hydralazine 10 mg every 8 hours may need further dose adjustments as blood pressure today was 152/98

## 2020-01-23 NOTE — Assessment & Plan Note (Signed)
Further smoking cessation counseling was given to the patient at this visit

## 2020-01-23 NOTE — Telephone Encounter (Signed)
Belenda Cruise, Please check which providers have an earlier [facility] slot open.  Ty, C

## 2020-01-23 NOTE — Telephone Encounter (Signed)
[place] ph# [phone] or ph# [phone] called at the request of Dr. Joya Gaskins. Patient is scheduled for New Patient Appointment in September (please review referral information). Dr. Joya Gaskins requests patient to be seen as soon as possible due to patient being a Type 1 Diabetic.

## 2020-01-23 NOTE — Patient Instructions (Signed)
Increase Lantus to 17 units daily  Continue your NovoLog 5 units 3 times daily  No other medication changes  All your medication refills were sent to your [place] have an appointment with neurology [facility] neurology February 16  Our nurse case manager Opal Sidles is working on getting you a sooner appointment with endocrinology and with the retinal specialist  Keep your existing appointment with the kidney doctor on the 27th  Return to see Dr. Joya Gaskins in 1 month

## 2020-01-23 NOTE — Telephone Encounter (Addendum)
Call placed to [facility] Neurology, spoke to [facility] and scheduled patient for appointment 02/14/2020 @ 0815.  Call placed to [facility] Endocrinology # [phone], spoke to [place] who said that they do not have an appt available prior to 08/2020. She sent a message to the provider to inquire if an appt can be scheduled any earlier.  Call placed to Dr Caren [facility] # [phone], message left with call back requested to this CM

## 2020-01-24 LAB — CBC WITH DIFFERENTIAL/PLATELET
Basophils Absolute: 0.1 10*3/uL (ref 0.0–0.2)
Basos: 1 %
EOS (ABSOLUTE): 0.5 10*3/uL — ABNORMAL HIGH (ref 0.0–0.4)
Eos: 4 %
Hematocrit: 43.4 % (ref 37.5–51.0)
Hemoglobin: 14.5 g/dL (ref 13.0–17.7)
Immature Grans (Abs): 0 10*3/uL (ref 0.0–0.1)
Immature Granulocytes: 0 %
Lymphocytes Absolute: 1.7 10*3/uL (ref 0.7–3.1)
Lymphs: 14 %
MCH: 30.2 pg (ref 26.6–33.0)
MCHC: 33.4 g/dL (ref 31.5–35.7)
MCV: 90 fL (ref 79–97)
Monocytes Absolute: 0.7 10*3/uL (ref 0.1–0.9)
Monocytes: 6 %
Neutrophils Absolute: 8.7 10*3/uL — ABNORMAL HIGH (ref 1.4–7.0)
Neutrophils: 75 %
Platelets: 348 10*3/uL (ref 150–450)
RBC: 4.8 x10E6/uL (ref 4.14–5.80)
RDW: 13 % (ref 11.6–15.4)
WBC: 11.7 10*3/uL — ABNORMAL HIGH (ref 3.4–10.8)

## 2020-01-24 LAB — COMPREHENSIVE METABOLIC PANEL
ALT: 22 IU/L (ref 0–44)
AST: 18 IU/L (ref 0–40)
Albumin/Globulin Ratio: 2.7 — ABNORMAL HIGH (ref 1.2–2.2)
Albumin: 4.9 g/dL (ref 4.0–5.0)
Alkaline Phosphatase: 102 IU/L (ref 39–117)
BUN/Creatinine Ratio: 11 (ref 9–20)
BUN: 35 mg/dL — ABNORMAL HIGH (ref 6–20)
Bilirubin Total: 0.3 mg/dL (ref 0.0–1.2)
CO2: 23 mmol/L (ref 20–29)
Calcium: 9.6 mg/dL (ref 8.7–10.2)
Chloride: 103 mmol/L (ref 96–106)
Creatinine, Ser: 3.25 mg/dL — ABNORMAL HIGH (ref 0.76–1.27)
GFR calc Af Amer: 27 mL/min/{1.73_m2} — ABNORMAL LOW (ref >59)
GFR calc non Af Amer: 24 mL/min/{1.73_m2} — ABNORMAL LOW (ref >59)
Globulin, Total: 1.8 g/dL (ref 1.5–4.5)
Glucose: 114 mg/dL — ABNORMAL HIGH (ref 65–99)
Potassium: 4.9 mmol/L (ref 3.5–5.2)
Sodium: 142 mmol/L (ref 134–144)
Total Protein: 6.7 g/dL (ref 6.0–8.5)

## 2020-01-25 ENCOUNTER — Telehealth: Payer: Self-pay | Admitting: *Deleted

## 2020-01-25 DIAGNOSIS — N2581 Secondary hyperparathyroidism of renal origin: Secondary | ICD-10-CM | POA: Diagnosis not present

## 2020-01-25 DIAGNOSIS — E1022 Type 1 diabetes mellitus with diabetic chronic kidney disease: Secondary | ICD-10-CM | POA: Diagnosis not present

## 2020-01-25 DIAGNOSIS — N183 Chronic kidney disease, stage 3 unspecified: Secondary | ICD-10-CM | POA: Diagnosis not present

## 2020-01-25 DIAGNOSIS — I129 Hypertensive chronic kidney disease with stage 1 through stage 4 chronic kidney disease, or unspecified chronic kidney disease: Secondary | ICD-10-CM | POA: Diagnosis not present

## 2020-01-25 DIAGNOSIS — N184 Chronic kidney disease, stage 4 (severe): Secondary | ICD-10-CM | POA: Diagnosis not present

## 2020-01-25 DIAGNOSIS — R809 Proteinuria, unspecified: Secondary | ICD-10-CM | POA: Diagnosis not present

## 2020-01-25 DIAGNOSIS — D631 Anemia in chronic kidney disease: Secondary | ICD-10-CM | POA: Diagnosis not present

## 2020-01-25 NOTE — Telephone Encounter (Signed)
-----   Message from Elsie Stain, MD sent at 01/24/2020 12:28 PM EST ----- I tried to reach this patient, let him know glucose is better, kidney function still poor, he needs to keep his nephrology appt upcoming, CBC is improved

## 2020-01-25 NOTE — Telephone Encounter (Signed)
MA informed patient via VM. Patient is aware of CBC improving, to keep nephrology appointment and continue working on sugar intake.

## 2020-01-26 ENCOUNTER — Telehealth: Payer: Self-pay

## 2020-01-26 NOTE — Telephone Encounter (Signed)
Call placed to [facility], message left for Dr Ivan Croft regarding scheduling an appointment for patient.  Call back requested to this CM # [phone].  Call placed to [facility] endocrinology to inquire again about scheduling an appointment before 08/2020,  Spoke to [place] who said that the initial provider denied moving it up so  she will check with another provider.

## 2020-01-26 NOTE — Telephone Encounter (Signed)
Thank you for your assistance!

## 2020-02-01 ENCOUNTER — Telehealth: Payer: Self-pay

## 2020-02-01 NOTE — Telephone Encounter (Signed)
Thank you :)

## 2020-02-01 NOTE — Telephone Encounter (Signed)
Call received from patient's mother. She said that she has the appt information for endocrinology. Provided her with the appointment and contact information for eye appt 02/22/2020.  She was very pleased to have the appts already scheduled

## 2020-02-01 NOTE — Telephone Encounter (Signed)
Call placed to [facility], spoke to [facility] and scheduled patient for appointment with Dr Coralyn Pear  - 02/22/2020 @ 0845. He requested office visit notes be faxed to  # [phone], records then faxed as requested.   Call placed to patient/mother # [phone] to inform them of this appt and remind them of appt with endocrinology - 02/06/2020 @ 1500. Message left with call back requested to this CM

## 2020-02-06 ENCOUNTER — Encounter: Payer: Self-pay | Admitting: Internal Medicine

## 2020-02-06 ENCOUNTER — Ambulatory Visit (INDEPENDENT_AMBULATORY_CARE_PROVIDER_SITE_OTHER): Payer: Medicaid Other | Admitting: Internal Medicine

## 2020-02-06 ENCOUNTER — Other Ambulatory Visit: Payer: Self-pay

## 2020-02-06 VITALS — BP 158/88 | HR 90 | Temp 98.2°F | Ht 64.0 in | Wt 125.2 lb

## 2020-02-06 DIAGNOSIS — E10319 Type 1 diabetes mellitus with unspecified diabetic retinopathy without macular edema: Secondary | ICD-10-CM | POA: Diagnosis not present

## 2020-02-06 DIAGNOSIS — E1059 Type 1 diabetes mellitus with other circulatory complications: Secondary | ICD-10-CM | POA: Diagnosis not present

## 2020-02-06 DIAGNOSIS — E1065 Type 1 diabetes mellitus with hyperglycemia: Secondary | ICD-10-CM | POA: Diagnosis not present

## 2020-02-06 DIAGNOSIS — E1022 Type 1 diabetes mellitus with diabetic chronic kidney disease: Secondary | ICD-10-CM | POA: Diagnosis not present

## 2020-02-06 DIAGNOSIS — N184 Chronic kidney disease, stage 4 (severe): Secondary | ICD-10-CM

## 2020-02-06 NOTE — Progress Notes (Signed)
Name: George [name]  MRN/ DOB: [id number], 1986-06-12   Age/ Sex: 34 y.o., male    PCP: Elsie Stain, MD   Reason for Endocrinology Evaluation: Type1 Diabetes Mellitus     Date of Initial Endocrinology Visit: 02/07/2020     PATIENT IDENTIFIER: [name] is a 34 y.o. male with a past medical history of HTN, T1DM and hx of CVA . The patient presented for initial endocrinology clinic visit on 02/07/2020 for consultative assistance with his diabetes management.    HPI: [name] was   Diagnosed with DM at age 55 Currently checking blood sugars multiple times a day   Hypoglycemia episodes : yes               Symptoms: sweaty             Frequency: 2/ week  Hemoglobin A1c has ranged from 7.2% in 2021, peaking at 8.2%  Patient required assistance for hypoglycemia: no Patient has required hospitalization within the last 1 year from hyper or hypoglycemia: no  In terms of diet, the patient eats 3 meals a day, avoids sugar-sweetened beverages    Nephrologist : [facility]   HOME DIABETES REGIMEN: Lantus 17 units daily  Novolog 5 units QAC    Statin: Yes ACE-I/ARB: No Prior Diabetic Education: Yes   METER DOWNLOAD SUMMARY: Date range evaluated: 1/26-02/06/2020 Fingerstick Blood Glucose Tests = 58 Average Number Tests/Day = 4.1 Overall Mean FS Glucose = 184   BG Ranges: Low = 59 High = 481   Hypoglycemic Events/30 Days: BG < 50 = 0 Episodes of symptomatic severe hypoglycemia = 0   DIABETIC COMPLICATIONS: Microvascular complications:   Left retinopathy, CKD   Denies:neuropathy  Last eye exam: Completed 11/22/2019  Macrovascular complications:   CVA 99991111 , 09/2019 and 12/2019  Denies: CAD, PVD,   PAST HISTORY: Past Medical History:  Past Medical History:  Diagnosis Date  . AKI (acute kidney injury) ([place])   . Cerebral infarction ([place])   . Hypertension   . Neurologic deficit due to acute ischemic cerebrovascular accident (CVA) ([place]) 06/13/2019   . Supraventricular tachycardia ([place])   . Type 1 diabetes ([place])    Past Surgical History:  Past Surgical History:  Procedure Laterality Date  . BUBBLE STUDY  06/20/2019   Procedure: BUBBLE STUDY;  Surgeon: Fay Records, MD;  Location: [place];  [name]: Cardiovascular;;  . TEE WITHOUT CARDIOVERSION N/A 06/20/2019   Procedure: TRANSESOPHAGEAL ECHOCARDIOGRAM (TEE);  Surgeon: Fay Records, MD;  Location: [facility] ENDOSCOPY;  [name]: Cardiovascular;  Laterality: N/A;      Social History:  reports that he has been smoking cigarettes. He started smoking about 9 years ago. He has been smoking about 0.25 packs per day. He has never used smokeless tobacco. He reports previous drug use. No history on file for alcohol. Family History:  Family History  Problem Relation Age of Onset  . Hypertension Mother   . Diabetes Mother   . Hyperlipidemia Mother   . Diabetes Father      HOME MEDICATIONS: Allergies as of 02/06/2020   No Known Allergies     Medication List       Accurate as of February 06, 2020 11:59 PM. If you have any questions, ask your nurse or doctor.        acetaminophen 325 MG tablet Commonly known as: TYLENOL Take 650 mg by mouth every 6 (six) hours as needed for headache.   amLODipine 10 MG tablet Commonly known as:  NORVASC Take 1 tablet (10 mg total) by mouth daily.   carvedilol 25 MG tablet Commonly known as: COREG Take 1 tablet (25 mg total) by mouth 2 (two) times daily with a meal.   clopidogrel 75 MG tablet Commonly known as: PLAVIX Take 1 tablet (75 mg total) by mouth daily.   ferrous sulfate 325 (65 FE) MG tablet Take 1 tablet (325 mg total) by mouth daily with breakfast.   hydrALAZINE 10 MG tablet Commonly known as: APRESOLINE Take 1 tablet (10 mg total) by mouth every 8 (eight) hours.   insulin glargine 100 UNIT/ML injection Commonly known as: LANTUS Inject 0.17 mLs (17 Units total) into the skin daily.   NovoLOG FlexPen 100 UNIT/ML  FlexPen Generic drug: insulin aspart Inject 5 Units into the skin 3 (three) times daily with meals.   OneTouch Verio test strip Generic drug: glucose blood Use as instructed to check blood sugar 3 times daily.   pantoprazole 40 MG tablet Commonly known as: PROTONIX Take 1 tablet (40 mg total) by mouth daily. For stomach protection   Pen Needles 3/16" 31G X 5 MM Misc To inject insulin with novolog flex pen, 3 times daily with meals.   rosuvastatin 20 MG tablet Commonly known as: Crestor Take 1 tablet (20 mg total) by mouth daily.        ALLERGIES: No Known Allergies   REVIEW OF SYSTEMS: A comprehensive ROS was conducted with the patient and is negative except as per HPI and below:  ROS    OBJECTIVE:   VITAL SIGNS: BP (!) 158/88 (BP Location: Left Arm, Patient Position: Sitting, Cuff Size: Normal)   Pulse 90   Temp 98.2 F (36.8 C)   Ht 5\' 4"  (1.626 m)   Wt 125 lb 3.2 oz (56.8 kg)   SpO2 98%   BMI 21.49 kg/m    PHYSICAL EXAM:  General: Pt appears well and is in NAD  HEENT:  Eyes: External eye exam normal without stare, lid lag or exophthalmos.  EOM intact.    Neck: General: Supple without adenopathy or carotid bruits. Thyroid: Thyroid size normal.  No goiter or nodules appreciated. No thyroid bruit.  Lungs: Clear with good BS bilat with no rales, rhonchi, or wheezes  Heart: RRR with normal S1 and S2 and no gallops; no murmurs; no rub  Abdomen: Normoactive bowel sounds, soft, nontender, without masses or organomegaly palpable  Extremities:  Lower extremities - No pretibial edema.  Skin: Normal texture and temperature to palpation.  Neuro: MS is good with appropriate affect, pt is alert and Ox3     DM foot exam: Deferred  DATA REVIEWED:  Lab Results  Component Value Date   HGBA1C 7.2 (H) 01/11/2020   HGBA1C 7.3 (H) 09/24/2019   HGBA1C 8.4 (H) 06/13/2019   Lab Results  Component Value Date   LDLCALC 60 01/12/2020   CREATININE 3.25 (H) 01/23/2020      Lab Results  Component Value Date   CHOL 128 01/12/2020   HDL 25 (L) 01/12/2020   LDLCALC 60 01/12/2020   TRIG 215 (H) 01/12/2020   CHOLHDL 5.1 01/12/2020        ASSESSMENT / PLAN / RECOMMENDATIONS:   1) Type 1 Diabetes Mellitus, poorly controlled, With retinopathic and CKD IV complications - Most recent A1c of 7.2 %. Goal A1c < 7.0 %.    Plan: GENERAL: I have discussed with the patient the pathophysiology of diabetes. We went over the natural progression of the disease. We talked about  both insulin resistance and insulin deficiency. We stressed the importance of lifestyle changes including diet and exercise. I explained the complications associated with diabetes including retinopathy, nephropathy, neuropathy as well as increased risk of cardiovascular disease. We went over the benefit seen with glycemic control.    I explained to the patient that diabetic patients are at higher than normal risk for amputations.  His A1c is falsely skewed downwards due to CKD  I have praised the patient on frequent glucose checks and having that data available to me today, and review of his meter download he is noted to have fluctuating BG's ranging from severe hyperglycemia to hypoglycemia, patient admits to dietary indiscretions, he also tends to snack sometimes without a prandial coverage patient also tends to bolus after a meal, patient also tends to get sometimes on the correction dose, we discussed the risk of hypoglycemia especially in the setting of CKD 4.   I have recommended that he takes 2 units of NovoLog with snacks, I have explained to him that if he eats anything with carbohydrates and no prandial coverage he will have hypoglycemia.  I will also give him a correction scale so he does not guess on a correction dose.  I have recommended the following adjustments to his regimen,   MEDICATIONS: Continue lantus 17 units daily Novolog 5 units with each meal NovoLog 2 units with a  snack CF: NovoLog (BG-130/55)   EDUCATION / INSTRUCTIONS:  BG monitoring instructions: Patient is instructed to check his blood sugars 4 times a day, before meals and bedtime.  Call [facility] clinic if: BG persistently < 70 or > 300. . I reviewed the Rule of 15 for the treatment of hypoglycemia in detail with the patient. Literature supplied.   2) Diabetic complications:   Eye: Does  have known diabetic retinopathy.   Neuro/ Feet: Does night have known diabetic peripheral neuropathy.  Renal: Patient does  have known baseline CKD. He is not on an ACEI/ARB at present.  3) Lipids: Patient is on a statin.    4) Hypertension: He is above goal of < 140/90 mmHg.  Will defer further management to his PCP   Follow-up in 3 months      Signed electronically by: Mack Guise, MD  [facility] Endocrinology  [address] [place],  [address] Phone: [phone] FAX: [phone]   CC: Elsie Stain, MD [address] [place] [address] Phone: [phone]  Fax: [phone]    Return to Endocrinology clinic as below: Future Appointments  Date Time Provider [place]  02/14/2020  8:15 AM Frann Rider, NP [facility] None  02/22/2020  8:45 AM Bernarda Caffey, MD [facility] None  05/07/2020  2:00 PM Vikki Gains, Melanie Crazier, MD [facility] None

## 2020-02-06 NOTE — Patient Instructions (Signed)
-   Continue lantus 17 units daily - Novolog 5 units with each meal - You  May take 2 units of Novolog with a snack  - Novolog correctional insulin: ADD extra units on insulin to your meal-time novolog dose if your blood sugars are higher than . Use the scale below to help guide you:   Blood sugar before meal Number of units to inject  Less than 185 0 unit  186-  240 1 units  241-  295 2 units  296-  350 3 units  351 -  405 4 units     -HOW TO TREAT LOW BLOOD SUGARS (Blood sugar LESS THAN 70 MG/DL)  Please follow the RULE OF 15 for the treatment of hypoglycemia treatment (when your (blood sugars are less than 70 mg/dL)    STEP 1: Take 15 grams of carbohydrates when your blood sugar is low, which includes:   3-4 GLUCOSE TABS  OR  3-4 OZ OF JUICE OR REGULAR SODA OR  ONE TUBE OF GLUCOSE GEL     STEP 2: RECHECK blood sugar in 15 MINUTES STEP 3: If your blood sugar is still low at the 15 minute recheck --> then, go back to STEP 1 and treat AGAIN with another 15 grams of carbohydrates.

## 2020-02-07 ENCOUNTER — Encounter: Payer: Self-pay | Admitting: Internal Medicine

## 2020-02-07 DIAGNOSIS — E109 Type 1 diabetes mellitus without complications: Secondary | ICD-10-CM | POA: Insufficient documentation

## 2020-02-07 DIAGNOSIS — E1065 Type 1 diabetes mellitus with hyperglycemia: Secondary | ICD-10-CM | POA: Insufficient documentation

## 2020-02-07 DIAGNOSIS — N185 Chronic kidney disease, stage 5: Secondary | ICD-10-CM | POA: Insufficient documentation

## 2020-02-07 DIAGNOSIS — E1022 Type 1 diabetes mellitus with diabetic chronic kidney disease: Secondary | ICD-10-CM | POA: Insufficient documentation

## 2020-02-07 DIAGNOSIS — E10319 Type 1 diabetes mellitus with unspecified diabetic retinopathy without macular edema: Secondary | ICD-10-CM | POA: Insufficient documentation

## 2020-02-08 ENCOUNTER — Telehealth: Payer: Self-pay

## 2020-02-08 DIAGNOSIS — E10319 Type 1 diabetes mellitus with unspecified diabetic retinopathy without macular edema: Secondary | ICD-10-CM

## 2020-02-08 NOTE — Telephone Encounter (Signed)
Requesting we refer to retina clinic at [facility] , I put another referral in the chart

## 2020-02-08 NOTE — Telephone Encounter (Signed)
Call received from patient's mother, Reino Bellis. Explained to her that the appt at [facility] scheduled for 02/22/2020 has been cancelled and Dr Joya Gaskins will be placing a referral to [facility].  Informed her that she would be contacted with an appointment time.

## 2020-02-08 NOTE — Telephone Encounter (Signed)
Fax received from [facility] with notation that "provider declined. patient needs to see general opth." Call placed to the office, spoke to Vicente Males to clarify the notation as the patient still has an appointment scheduled with Dr Coralyn Pear 02/22/2020. He explained that the provider feels that due to the patient's multiple medical issues he would be best managed at a university/academic setting - [facility] or [facility].    Attempted to contact the patient's mother to inform her that the appt is being cancelled and this information will be shared with Dr Joya Gaskins.  Message left with call back requested to this CM.

## 2020-02-10 NOTE — Telephone Encounter (Signed)
Sent Referral to    [facility] Department of Ophthalmology [facility] [address] [place], [place] [address] PH# [phone] Fax # [phone]

## 2020-02-13 NOTE — Progress Notes (Signed)
[facility] [address] [place]. [place] [address] [phone]       [place]  George [name] Date of Birth:  1986/10/19 Medical Record Number:  [id number]   Reason for Referral: Reoccurring strokes     CHIEF COMPLAINT:  Chief Complaint  Patient presents with  . Hospitalization Follow-up    Mom present. Rm 9. Patient mentioned that he has been having some fatigue since being home from the hospital.     HPI:  Stroke admission 01/11/2020: [name] is a 34 y.o. male with history of stroke and 07/18/2019 and 09/18/2019, DKA, type I DB, HTN, CKD stage III presenting with hematemesis and altered mental status on 01/12/2020.  Evaluated by stroke team and Dr. Erlinda Hong with stroke work-up revealing recurrent small right cerebellar and punctate right frontal cortical infarcts as evidenced on MRI with prior history of embolic stroke and multiple risk factors.  MRA head/neck largely unremarkable and 2D echo normal EF without cardiac source of embolus identified.  Recommended DAPT for 3 weeks then Plavix alone.  Prior 2-week cardiac monitor completed 10/2019 negative for atrial fibrillation.  Prior history of stroke 05/2019 left PCA 2/2 large vessel disease versus cardioembolic source and [id number] multiple bilateral infarcts likely 2/2 synchronized small vessel disease.  Recurrent DKA and uncontrolled DM placed on insulin drip with A1c 7.2.  Advised to follow-up outpatient with endocrinology with consideration of insulin pump.  Upon admission, hypertensive urgency with BP 184/115 and recommended long-term BP goal normotensive range.  LDL 68 and recommended continuation of Crestor 20 mg daily.  Current tobacco use with smoking cessation counseling provided.  Other stroke risk factors include history of EtOH use and substance abuse with UDS THC positive.  Other active problems include acute encephalopathy, acute on CKD stage IV and GERD.  He was discharged home in  stable condition without therapy needs.  George [name] is being seen today for hospital follow up accompanied by his mother.  He has been doing well since discharge without residual deficits.  He does continue to complain of visual impairment but has recently been referred to retina specialist due to concern for diabetic retinopathy.  Endorses improvement of glucose levels with recent average 170s which has greatly improved from prior with ongoing use of Lantus.  Recently established care with endocrinology.  Completed 3 weeks DAPT and continues on Plavix alone without bleeding or bruising.  Continues on Crestor 20 mg daily without myalgias.  After prior visit, discontinued atorvastatin and switch to Crestor due to possible statin myalgias which greatly improved.  [name] pressure today 145/89.  Does not routinely monitor [name] pressure at home.  Ongoing tobacco use previously 1 pack daily and now 2 packs/week.  Ongoing THC use with patient reporting benefit due to morning nausea and history of gastroparesis.  No further concerns at this time.     History copied for reference purposes only Stroke admission 06/12/2019: [name] is a 34 y.o. male with history of type I DB presented on 06/12/2019 with receptive aphasia and word salad responses, not oriented with HA.  Stroke work-up revealed large left PCA infarct embolic pattern secondary to large vessel disease versus cardioembolic source.  Initial CT head showed large left PCA infarct.  MRI brain showed large left PCA infarct with petechial hemorrhage left occipital lobe HI 1.  Carotid Doppler and 2D echo unremarkable.  Lower extremity venous Dopplers negative for DVT.  TEE did not show evidence of PFO or cardiac source  of embolus.  Recommended 30-day cardiac event monitor outpatient to assess for atrial fibrillation.  Hypercoagulable/vasculitis work-up negative.  LDL 102 and A1c 8.4.  Recommended DAPT for 3 months then aspirin alone due to  intracranial stenosis.  No prior history of HTN with elevation during admission and started on amlodipine 10 mg and metoprolol 50 mg twice daily.  Also found to have CKD, stage III with referral to nephrology.  Initiated atorvastatin 40 mg daily.  Advised to follow-up with PCP for uncontrolled DM.  Initial finding of leukocytosis improved during admission with work-up unremarkable and likely reactive.  Other stroke risk factors include EtOH use, THC use and tobacco use.  He was discharged home in stable condition with residual aphasia.  Initial visit 07/26/2019: Residual deficits expressive aphasia and delayed recall.  He does endorse overall improvement with only occasional word finding difficulty.  He is currently in the process of obtaining insurance therefore therapies were not started.  Has returned to playing video games with improvement - was having difficulty initially  He continues to live at home with his mother but is able to perform all prior activities He does have complaints of swelling bilateral lower extremity swelling - started once he started his new prescriptions; he does have appointment next Monday with Nephrology for initial evaluation due to elevated creatinine and CKD stage III Continues on aspirin and Plavix without bleeding or bruising Continues on atorvastatin without myalgias -recent lipid panel by PCP LDL 71 [name] pressure stable at 139/83 No further concerns at this time  Update 11/09/2019: George [name] is a 34 year old male who is being seen today for recent hospital discharge along with prior stroke follow-up accompanied by his mother.  He presented to the ED on 09/25/2019 with AMS, DKA and agitation.  Stroke work-up showed multiple bilateral infarcts embolic pattern as evidenced on MRI cryptogenic etiology.  MRI showed 5 mm acute infarct right parietal cortex over the convexity, small area of subacute infarct right cerebellum, chronic infarct left cerebellum and chronic  hemorrhagic infarct left occipital lobe.  TEE during prior admission did not show evidence of PFO or cardiac source of embolus and recommended consideration of transthoracic agitated saline exam.  LDL 71.  A1c 7.3.  USD positive for THC.  Due to lack of insurance, he did admit to being noncompliant with medications.  Recommended DAPT for 3 weeks and Plavix alone.  [name] pressure elevated during admission and recommended long-term BP goal normotensive range.  Uncontrolled DM and needs follow-up with PCP.  Other stroke risk factors include current tobacco use, THC use, previous EtOH use, history of stroke, family history of stroke and prior substance abuse.  Discharged home in stable condition.  He has been stable since hospital discharge.  He does continue to have decreased vision and balance deficit which slightly worsened since recent stroke but has been improving.  He also endorses L>R lower extremity weakness/fatigue feeling along with muscle and joint aching.  He has continued on aspirin and Plavix without bleeding or bruising.  Continues on atorvastatin with possible statin myalgias.  He was seen by cardiology on 09/30/2019 and recommended 14-day cardiac monitor to assess for episodes of A. fib or flutter which was unremarkable.  Continues to be followed by cardiology for HTN management.  Does not routinely monitor [name] pressure at home with today's reading 137/78.  At times he can feel lightheaded sensation/dizziness shortly after taking hydralazine dosage but resolves fairly quickly.  He does continue to use tobacco and THC with  attempts of quitting.  Continues to live at home with his mother and currently in the process of Medicaid approval and disability.  No further concerns at this time.    ROS:   14 system review of systems performed and negative with exception of vision deficit  PMH:  Past Medical History:  Diagnosis Date  . AKI (acute kidney injury) ([place])   . Cerebral infarction ([place])   .  Hypertension   . Neurologic deficit due to acute ischemic cerebrovascular accident (CVA) ([place]) 06/13/2019  . Supraventricular tachycardia ([place])   . Type 1 diabetes ([facility])     PSH:  Past Surgical History:  Procedure Laterality Date  . BUBBLE STUDY  06/20/2019   Procedure: BUBBLE STUDY;  Surgeon: Fay Records, MD;  Location: [facility];  Service: Cardiovascular;;  . TEE WITHOUT CARDIOVERSION N/A 06/20/2019   Procedure: TRANSESOPHAGEAL ECHOCARDIOGRAM (TEE);  Surgeon: Fay Records, MD;  Location: [facility] ENDOSCOPY;  Service: Cardiovascular;  Laterality: N/A;    Social History:  Social History   Socioeconomic History  . Marital status: Single    Spouse name: Not on file  . Number of children: Not on file  . Years of education: Not on file  . Highest education level: Not on file  Occupational History  . Not on file  Tobacco Use  . Smoking status: Current Every Day Smoker    Packs/day: 0.25    Types: Cigarettes    Start date: 12/29/2010  . Smokeless tobacco: Never Used  Substance and Sexual Activity  . Alcohol use: Not on file    Comment: 6 beers/week  . Drug use: Not Currently  . Sexual activity: Not Currently  Other Topics Concern  . Not on file  Social History Narrative  . Not on file   Social Determinants of Health   Financial Resource Strain:   . Difficulty of Paying Living Expenses: Not on file  Food Insecurity:   . Worried About Charity fundraiser in the Last Year: Not on file  . Ran Out of Food in the Last Year: Not on file  Transportation Needs:   . Lack of Transportation (Medical): Not on file  . Lack of Transportation (Non-Medical): Not on file  Physical Activity:   . Days of Exercise per Week: Not on file  . Minutes of Exercise per Session: Not on file  Stress:   . Feeling of Stress : Not on file  Social Connections:   . Frequency of Communication with Friends and Family: Not on file  . Frequency of Social Gatherings with Friends and Family: Not on file  .  Attends Religious Services: Not on file  . Active Member of Clubs or Organizations: Not on file  . Attends Archivist Meetings: Not on file  . Marital Status: Not on file  Intimate Partner Violence:   . Fear of Current or Ex-Partner: Not on file  . Emotionally Abused: Not on file  . Physically Abused: Not on file  . Sexually Abused: Not on file    Family History:  Family History  Problem Relation Age of Onset  . Hypertension Mother   . Diabetes Mother   . Hyperlipidemia Mother   . Diabetes Father     Medications:   Current Outpatient Medications on File Prior to Visit  Medication Sig Dispense Refill  . acetaminophen (TYLENOL) 325 MG tablet Take 650 mg by mouth every 6 (six) hours as needed for headache.    Marland Kitchen amLODipine (NORVASC) 10 MG  tablet Take 1 tablet (10 mg total) by mouth daily. 30 tablet 5  . carvedilol (COREG) 25 MG tablet Take 1 tablet (25 mg total) by mouth 2 (two) times daily with a meal. 180 tablet 1  . clopidogrel (PLAVIX) 75 MG tablet Take 1 tablet (75 mg total) by mouth daily. 30 tablet 2  . ferrous sulfate 325 (65 FE) MG tablet Take 1 tablet (325 mg total) by mouth daily with breakfast. 30 tablet 6  . glucose [name] (ONETOUCH VERIO) test strip Use as instructed to check [name] sugar 3 times daily. 100 each 11  . hydrALAZINE (APRESOLINE) 25 MG tablet Take 25 mg by mouth 3 (three) times daily.    . insulin aspart (NOVOLOG FLEXPEN) 100 UNIT/ML FlexPen Inject 5 Units into the skin 3 (three) times daily with meals. 3 mL 3  . insulin glargine (LANTUS) 100 UNIT/ML injection Inject 0.17 mLs (17 Units total) into the skin daily. 10 mL 0  . Insulin Pen Needle (PEN NEEDLES 3/16") 31G X 5 MM MISC To inject insulin with novolog flex pen, 3 times daily with meals. 100 each 1  . pantoprazole (PROTONIX) 40 MG tablet Take 1 tablet (40 mg total) by mouth daily. For stomach protection 30 tablet 11  . rosuvastatin (CRESTOR) 20 MG tablet Take 1 tablet (20 mg total) by mouth  daily. 30 tablet 4   No current facility-administered medications on file prior to visit.    Allergies:  No Known Allergies   Physical Exam  Vitals:   02/14/20 0809  BP: (!) 145/89  Pulse: 85  Temp: 97.6 F (36.4 C)  TempSrc: Oral  Weight: 127 lb (57.6 kg)  Height: 5\' 4"  (1.626 m)   Body mass index is 21.8 kg/m. No exam data present  General: well developed, well nourished,  pleasant middle-age Caucasian male, seated, in no evident distress Head: head normocephalic and atraumatic.   Neck: supple with no carotid or supraclavicular bruits Cardiovascular: regular rate and rhythm, no murmurs Musculoskeletal: no deformity Skin:  no rash/petichiae Vascular:  Normal pulses all extremities   Neurologic Exam Mental Status: Awake and fully alert.   Speech and language normal.  Oriented to place and time. Recent and remote memory intact. Attention span, concentration and fund of knowledge appropriate. Mood and affect appropriate.  Cranial Nerves: Fundoscopic exam reveals sharp disc margins. Pupils equal, briskly reactive to light. Extraocular movements full without nystagmus. Visual fields subjective blurriness right upper visual field -stable from prior visit.  Hearing intact. Facial sensation intact. Face, tongue, palate moves normally and symmetrically.  Motor: Normal bulk and tone. Normal strength in all tested extremity muscles. Sensory.: intact to touch , pinprick , position and vibratory sensation.  Coordination: Rapid alternating movements normal in all extremities. Finger-to-nose and heel-to-shin performed accurately bilaterally. Gait and Station: Arises from chair without difficulty. Stance is normal. Gait demonstrates normal stride length and balance.  Able to tandem gait without difficulty. Reflexes: 1+ and symmetric. Toes downgoing.     NIHSS  0 Modified Rankin  0    Diagnostic Data (Labs, Imaging, Testing)   Mr Brain Wo Contrast 09/24/2019 IMPRESSION:  1. 5 mm  acute infarct right parietal cortex over the convexity  2. Small area of subacute infarct right cerebellum. Chronic infarct left cerebellum  3. Chronic hemorrhagic infarct left occipital lobe.   01/12/2020 IMPRESSION: 1. 19 mm acute to early subacute ischemic infarct involving the peripheral right cerebellum. 2. Additional 4 mm subacute ischemic infarct involving the right frontal cortex as  above. 3. Underlying chronic left PCA and left cerebellar infarcts.  MR ANGIO HEAD WO CONTRAST MR ANGIO NECK WO CONTRAST 01/13/2020 IMPRESSION: 1. No emergent finding. 2. Chronic poor left PCA branch flow correlating with remote Infarct.       ASSESSMENT: George [name] is a 34 y.o. year old male here with multiple reoccurring strokes including large left PCA infarct embolic pattern on [id number] secondary to large vessel disease source versus cardioembolic with negative cardiac monitor, 09/25/2019 with finding of multiple bilateral infarcts cryptogenic etiology and recently on 01/12/2020 acute early subacute ischemic infarct involving peripheral right cerebellum and subacute infarcts involving right frontal cortex likely secondary to uncontrolled risk factors. Vascular risk factors include uncontrolled DM type 1 with reoccurring DKA, HTN, HLD, CKD stage III, EtOH use, THC use and tobacco use.  Ongoing complaints of visual impairment questionable stroke related versus diabetic retinopathy    PLAN:  1. Multiple recurrent strokes:  Continue clopidogrel 75 mg daily and Crestor 20 mg daily for secondary stroke prevention. Maintain strict control of hypertension with [name] pressure goal below 130/90, diabetes with hemoglobin A1c goal below 6.5% and cholesterol with LDL cholesterol (bad cholesterol) goal below 70 mg/dL.  I also advised the patient to eat a healthy diet with plenty of whole grains, cereals, fruits and vegetables, exercise regularly with at least 30 minutes of continuous activity daily and  maintain ideal body weight. 2. HTN: Advised to continue current treatment regimen.  Encouraged to monitor [name] pressure at home routinely to ensure satisfactory management.  Advised him to follow-up with cardiology or PCP for ongoing management and monitoring 3. HLD: Continue Crestor 20 mg daily and ongoing monitoring management by PCP 4. DMI: Advised to continue to monitor glucose levels at home along with continued follow-up with endocrinology for management and monitoring 5. CKD stage III: Continue to follow nephrology as scheduled 6. Visual impairment: post versus diabetic retinopathy.  Highly encouraged ensuring evaluation by retina specialist for further evaluation 7. Tobacco/THC use: Discussion with patient regarding importance of cessation due to increased risk of reoccurring stroke and cardiac disease with ongoing use.  He verbalized understanding.   Follow up in 3 months or call earlier if needed   Greater than 50% of time during this 40 minute visit was spent on counseling, discussion regarding recurrent strokes with ongoing uncontrolled risk factors and tobacco and THC use, reviewing risk factor management of HTN, HLD, DM and CKD, planning of further management along with potential future management, and discussion with patient and family answering all questions.    Frann Rider, AGNP-BC  [facility] [address] [place], [place] [address]  Phone [phone] Fax [phone] Note: This document was prepared with digital dictation and possible smart phrase technology. Any transcriptional errors that result from this process are unintentional.

## 2020-02-14 ENCOUNTER — Telehealth: Payer: Self-pay

## 2020-02-14 ENCOUNTER — Ambulatory Visit: Payer: Medicaid Other | Admitting: Adult Health

## 2020-02-14 ENCOUNTER — Encounter: Payer: Self-pay | Admitting: Adult Health

## 2020-02-14 ENCOUNTER — Other Ambulatory Visit: Payer: Self-pay

## 2020-02-14 VITALS — BP 145/89 | HR 85 | Temp 97.6°F | Ht 64.0 in | Wt 127.0 lb

## 2020-02-14 DIAGNOSIS — E785 Hyperlipidemia, unspecified: Secondary | ICD-10-CM | POA: Diagnosis not present

## 2020-02-14 DIAGNOSIS — I639 Cerebral infarction, unspecified: Secondary | ICD-10-CM | POA: Diagnosis not present

## 2020-02-14 DIAGNOSIS — E1021 Type 1 diabetes mellitus with diabetic nephropathy: Secondary | ICD-10-CM | POA: Diagnosis not present

## 2020-02-14 DIAGNOSIS — I151 Hypertension secondary to other renal disorders: Secondary | ICD-10-CM | POA: Diagnosis not present

## 2020-02-14 DIAGNOSIS — N2889 Other specified disorders of kidney and ureter: Secondary | ICD-10-CM

## 2020-02-14 DIAGNOSIS — N1831 Chronic kidney disease, stage 3a: Secondary | ICD-10-CM

## 2020-02-14 DIAGNOSIS — Z720 Tobacco use: Secondary | ICD-10-CM

## 2020-02-14 NOTE — Telephone Encounter (Signed)
Call placed to patient's mother, Reino Bellis, to inform her that a referral has been sent to [facility] for an ophthalmology appt.  Message left with call back requested to this CM

## 2020-02-14 NOTE — Telephone Encounter (Signed)
Patients mother called and stated she missed a call from [facility] the Care management coordinator regarding her son (the patient). Patient mother was informed of the note placed, verbalized understanding and had no further questions. Patients mother was asked if she still wanted to speak with jane and she declined, stated she had no further questions.

## 2020-02-14 NOTE — Patient Instructions (Signed)
Continue clopidogrel 75 mg daily  and Crestor 20mg   for secondary stroke prevention  Continue to follow up with PCP regarding cholesterol and blood pressre management   Continue to follow with endocrinology for diabetic management  Recommend starting to monitor blood pressure and glucose levels at home  Highly recommend decreasing tobacco use until complete cessation and THC   Maintain strict control of hypertension with blood pressure goal below 130/90, diabetes with hemoglobin A1c goal below 6.5% and cholesterol with LDL cholesterol (bad cholesterol) goal below 70 mg/dL. I also advised the patient to eat a healthy diet with plenty of whole grains, cereals, fruits and vegetables, exercise regularly and maintain ideal body weight.  Followup in the future with me in 3 months or call earlier if needed       Thank you for coming to see [place] at [facility]. I hope we have been able to provide you high quality care today.  You may receive a patient satisfaction survey over the next few weeks. We would appreciate your feedback and comments so that we may continue to improve ourselves and the health of our patients.

## 2020-02-16 NOTE — Progress Notes (Signed)
I agree with the above plan 

## 2020-02-22 ENCOUNTER — Encounter (INDEPENDENT_AMBULATORY_CARE_PROVIDER_SITE_OTHER): Payer: Medicaid Other | Admitting: Ophthalmology

## 2020-03-08 ENCOUNTER — Ambulatory Visit: Payer: MEDICAID | Admitting: Adult Health

## 2020-03-15 ENCOUNTER — Telehealth: Payer: Self-pay | Admitting: Critical Care Medicine

## 2020-03-15 NOTE — Telephone Encounter (Signed)
George [name] mother wanted to inform George [name] CM at clinic that she has not heard back from referral.

## 2020-03-15 NOTE — Telephone Encounter (Signed)
Message sent to [facility] referral specialist requesting an update on the status of referral to [facility] eye specialist.

## 2020-03-19 ENCOUNTER — Telehealth: Payer: Self-pay | Admitting: Critical Care Medicine

## 2020-03-19 MED ORDER — INSULIN GLARGINE 100 UNIT/ML ~~LOC~~ SOLN: 17.0000 [IU] | SUBCUTANEOUS | 0 refills | Status: DC

## 2020-03-19 MED ORDER — NOVOLOG FLEXPEN 100 UNIT/ML ~~LOC~~ SOPN: 5.0000 [IU] | PEN_INJECTOR | Freq: Three times a day (TID) | SUBCUTANEOUS | 3 refills | Status: DC

## 2020-03-19 MED ORDER — CARVEDILOL 25 MG PO TABS: 25.0000 mg | ORAL_TABLET | Freq: Two times a day (BID) | ORAL | 1 refills | Status: DC

## 2020-03-19 MED ORDER — "PEN NEEDLES 3/16"" 31G X 5 MM MISC": 1 refills | Status: DC

## 2020-03-19 NOTE — Telephone Encounter (Signed)
All meds refilled as requested  I agree he should get the Covid vaccine

## 2020-03-19 NOTE — Telephone Encounter (Signed)
Called patient - Left message

## 2020-03-19 NOTE — Telephone Encounter (Signed)
Patients mother called and requested for listed medications to be refilled and sent to [facility] in [place] off of [address]. Patients mother also stated that the patient will be getting his 1st [phone] vaccine tomorrow afternoon, she stated if PCP felt otherwise to call and inform her. Please follow up at your earliest convenience.   carvedilol (COREG) 25 MG tablet [[id number]]   insulin glargine (LANTUS) 100 UNIT/ML injection [[id number]]   insulin aspart (NOVOLOG FLEXPEN) 100 UNIT/ML FlexPen [[id number]]   Insulin Pen Needle (PEN NEEDLES 3/16") 31G X 5 MM MISC [[id number]]

## 2020-03-20 NOTE — Telephone Encounter (Signed)
Thank You.

## 2020-03-20 NOTE — Telephone Encounter (Signed)
Informed Harriet of eye clinic so she can schedule an appointment and she will call [facility].

## 2020-03-20 NOTE — Telephone Encounter (Signed)
Per George [name]/[facility] Referral specialist:  The referral was sent to :  [facility] Department of Ophthalmology [facility] [address] [place], [place] [address] PH# [phone] Fax # [phone] ? George Sierras called  [facility] they said that they been contact patient several times   2/15 @ 224pm they sent a message in My chart too and 2/22 they mailed a letter so they can contact them to schedule an appointment.   George Sierras called the mother and lvm with the same info and the phone number to schedule an appointment .  This CM attempted to contact the patient's mother today. # [phone] to share this referral information.  Message left with call back requested.

## 2020-04-03 ENCOUNTER — Ambulatory Visit: Payer: Medicaid Other | Admitting: Critical Care Medicine

## 2020-04-03 ENCOUNTER — Telehealth: Payer: Self-pay | Admitting: Critical Care Medicine

## 2020-04-03 MED ORDER — CLOPIDOGREL BISULFATE 75 MG PO TABS: 75.0000 mg | ORAL_TABLET | Freq: Every day | ORAL | 3 refills | Status: DC

## 2020-04-03 NOTE — Telephone Encounter (Signed)
Due to recurrent stroke in Jan , neurology wants pt on plavix 75mg  daily for life.  Not sure how it got off the med list  I sent refills

## 2020-04-03 NOTE — Progress Notes (Deleted)
Subjective:    Patient ID: George [name], male    DOB: [birth year], 34 y.o.   MRN: [id number] History of Present Illness: This is a 34 year old male has had previous history of cerebral infarction, type 1 diabetes, hypertension, tobacco use, supraventricular tachycardia, chronic kidney disease.  Since the last visit the patient has improved somewhat.  Labs did come back showing normal thyroid function and cholesterol at goal.  The patient's blood sugars have been reasonably well-controlled running fasting levels in the 1 20-1 60 range.  Note today the CBG is 180 blood pressure has come down but still not yet at goal today 160/112  The patient has yet to see speech pathology for his aphasia.  The barrier there was the patient did not have insurance and I indicated to the scheduler the patient can be self-pay  The patient has been able to reduce tobacco use down to 3 to 5 cigarettes daily and has yet to pick up the nicotine lozenges   09/19/2019 This is a follow-up visit for this 34 year old male with type 1 diabetes, hypertension, tobacco use, chronic kidney disease, and previous history of cerebral infarction.  The patient states apart from some fatigue he is doing fairly well.  He is now smoking 2 packs of cigarettes a week.  His blood pressure at home ranges been 130-135/80-85 range.  His blood sugars have been in the 1 20-1 50s with average is 1 30-1 40 on his meter.  His sugar will go down as low as 60s before dinner.  The patient does have history of receptive a aphasia and notes when he is playing his video games he will remember some names but not all the names of the characters in the game.  He has difficulty with memory retrieval.  He saw speech pathology 1 time and has not yet made a follow-up visit.    Patient did see nephrology and has a follow-up visit pending.  At the last visit the patient not receive any labs.  The patient denies any chest pain or shortness of  breath.  12/21: Since the last visit the patient was hospitalized 4 days after our visit in September.  The patient had diabetic ketoacidosis and also new areas of stroke.  Below is the discharge summary from that hospitalization  [name] a 33 y.o.malewith medical history significant oftype 1 diabetes, hypertension, recent CVA in June with combined receptive and expressive aphasia, CKD stage III who presented here with altered mental status and reportedly has been having nausea and persistent vomiting since Wednesday. Reportedly from EDP patient was initially able to give some history but acutely became more agitated and was pulling out his IV. He was tachycardic up to 160s and had improvement of heart rate down to 120s with 10 mg of verapamil. He was also tachypneic but remained on room air. Patient was restless and agitated at the time my evaluation and per nursing report he had attempted to use his foot to remove his hand restraints. He repeatedly stated "please help me," but was unable to explain any of his symptoms to me. He was pan positive and replied yes to every review of systems questions. Also stating he has pain everywhere. When asked for his full name and where he was he only stated "[place]."  He was afebrile and hypertensive up to 170s/108. In sinus tachycardia up to 160 and mildly tachypneic on room air. CBC showed leukocytosis of 29.6 and no anemia. CMP showed glucose level 532 with  bicarb of 18 and anion gap of 24. pH was mildly alkalotic at 7.59. Lactic acid of 5 and then 3.2 following IV fluids. TSH normal. COVID testing negative. CT head shows 2 or 3 rounded oval regions of low attenuation in the cerebral hemisphere not seen on previous studies. Suggest could be small interval lacunar infarcts with the possibility of embolic disease. Findings were age-indeterminate. CT abdomen showed no acute findings. There is gallbladder sludge or stones but no findings  of acute cholecystitis.  He was seen for nausea, vomiting, and altered mental status.  He was found to be in DKA.  He improved with insulin gtt and was transitioned to his home regimen.  He had head imaging and was found to have new strokes.  He was seen by neurology who recommended DAPT x 21 days then plavix alone.  Hospitalization also c/b AKI on CKD improving at time of discharge.  Hospital Course:  Diabetic ketoacidosis  T1DM Hypoglycemia - Unclear precipitating cause.  - He's had borderline temps, tmax of 100.2 since admission, but clinically appears to be improving and UA and CXR not suggestive of infection - follow blood cx (gram positive rods in 1/2 blood cx - repeat cx from 9/27 ngtd)  and follow urine cx (urine did have WBC, but negative LE/nitrites - no growth) - hold off on abx for now - DKA now resolved, AG 13, bicarb 22, BG173 - resumehome insulin at reduced dose 8units NPH with 3 units at meals and SSI(he's had some hypoglycemia here and he notes some at home as well - discussed importance of f/u with Dr. Joya Gaskins and monitoring BG at home) - Diabetic coordinator - A1c is 7.3 - check HbA1C  Altered mental status/delirium -delirium precautions - utox positive for MJ -resolved  Acute Infarct R Parietal Cortex  Subacute Infarct R Cerebellum  Hx CVA: -Hx L PCA infarct in June - Was on ASA/plavix and supposed to stop plavix and continue ASA alone after 9/19 (admitting provider started on plavix- have added aspirin, will deferfinal planto neuro) - Thought 2/2 cardioembolic source -> they'd planned for 30 day event monitor as echo and TEE were negative for PFO or idenifiable cardiac source of embolus and hypercoagulable/vasculitis w/u was negative per recent neuro note - will reconsult neurology in setting of acute stroke, appreciate recs- recurrent cryptogenic strokes - recommend ASA and plavix x3 weeks then plavix alone.  Of note, discharged with 81 mg ASA with  plavix, but on review of neuro note - intended to be 325 mg aspirin with plavix x21 days then plavix alone.  Called mother to correct this after discharge - she expressed understanding.  Hypertension  Tachycardia Significantly elevated BP's this AM His clonidine and coreg were being held -> which likely contributed to rebound hypertension and tachycardia Resume clonidine and coreg and amlodipine - BP still significantly elevated - will add hydralazine at d/c to help gradually normalize BP after stroke - needs close follow up with Dr. Joya Gaskins EKG with sinus tachycardia  Acute kidney injury -Creatinine of 4.11 on admission - improved to 3.02 today - good UOP - baseline creatinine ~2.2-2.4 - renal US without hydro, distended bladder noted(per nursing 9/26, bladder scan showed around 500 cc, then he voided as much) - creatinine improving - follow with nephrology as outpatient -Continue to monitor serial BMP -Avoidnephrotoxic agent  Leukocytosis: improving, follow, follow cx- given sludge vs stone in gallbladder on imaging, will follow RUQ [place] as he did have N/V on presentation - RUQ  wnl  Gram Positive Rods in 1/2 Cx: suspect contaminant, improving off abx - follow repeat cx (NGTD)  In their interim the patient's had no additional episodes of fever or DKA.  His glycemic control has been such that he occasionally runs in the 70 range early in the morning but will rebound over 200 late in the day.  It is been difficult to control with the use of long-acting NPH twice daily 8 units in the morning 12 units in the evening at meals and then associate that with short acting Humalog  The patient has since followed up with cardiology with an event monitor showing no evidence of arrhythmia therefore no evidence for embolic event.  Neurology has seen the patient and recommended switch from atorvastatin to Crestor discontinuance of a full dose aspirin after 21 days and continuing Plavix for  life  Blood pressure control continue with amlodipine clonidine and Coreg The patient also has follow-up with [place] [facility] scheduled creatinine is in the 3 range Also an ophthalmology visit was obtained and there is apparently blood in the eyes with diabetic retinopathy  The patient does need a retinal eye referral  1/25  Since the last visit in December the patient required another admission for recurrent stroke in the cerebellar area.  The patient had ongoing nausea and went back into diabetic ketoacidosis as well.  Below is a copy of the discharge summary.  There is a follow-up appointment with nephrology upcoming and the endocrinology appointment but out until September therefore we need to intervene on that to get an earlier appointment  Also neurology appointment is pending from the hospitalization  Below is a copy of the discharge summary  Note this is a transition of care visit and below the discharge summary is the nurse case manager note    d/c summar y: Date of Admission: 01/11/2020                      Date of Discharge: 01/13/20  Admitting Physician: Lyndee Hensen, DO  Primary Care Provider: Elsie Stain, MD Consultants: Neurology   Indication for Hospitalization: DKA  Discharge Diagnoses/Problem List:  DKA in T1DM Fever Hematemesis Non-intractable nausea and vomiting Subacute CVA Acute on chronic CKD stage IV Anemia GERD  Disposition: Home   Discharge Condition: Improved   Discharge Exam:   BMW:UXLKG and conversational, in no acute distress CV: regular rate and rhythm, no murmurs appreciated RESP: no increased work of breathing, clear to ascultation bilaterally with no crackles, wheezes, or rhonchi  ABD: Bowel sounds present. Soft, Nontender, Nondistended.  MSK: no edema, or cyanosis noted SKIN: warm, dry NEURO: grossly normal, moves all extremities appropriately, alert and oriented PSYCH:Normal affect and thought content  Brief  Hospital Course:   Type 1 diabetes  nausea vomiting  DKA  hematemesis Trip Cavanagh is a 34 y.o. male presented after 3 days of vomiting that progressed to bloody emesis which prompted patient to seek care.  Mom reported patient had CBGs ranging 120-320.  On arrival, patient tachycardic (HR120s) and hypertensive (SBP 170s).  Labs concerning for mild DKA with BHB 3.73,CBG300,bicarb17, pH 7.4with elevated anion gap of 24.  He was given 2 L bolus in the ED. DKA protocol initiated and patient started on insulin drip and IV potassium.    Patient was transitioned to SQ insulin on 01/12/20.  Etiology of DKA unclear however patient did have of subacute stroke on CT head which could have been the precipitating factor for his  DKA. Overall, Pt's diabetes is controlled as A1c was 7.2.  Mom and patient wanted to start the hospital regimen of Lantus and NovoLog in the outpatient setting.  Patient was discharged with 15 Lantus and 3 units (Novolog) mealtime coverage.  Patient has a pending referral for [facility] endocrinology.   Acute encephalopathy   subacute CVA Perfecto had inappropriate answers to orientation questions (What year is it "8th",Whats your name "august 12th",Where are you " working on it") thus CT head was obtained.  CT head indicated 2 cm subacute infarct in the cerebellum that was not present on previous imaging.  Patient with history of 2 ischemic strokes in the past 6 months.  Neurology was consulted and recommended dual antiplatelet therapy for the next 3 weeks followed by monotherapy with Plavix thereafter.  MRA head and neck obtained did not show any emergent findings of flow limiting stenosis.  Patient to follow-up with stroke clinic at [facility] neurology patient in 4 weeks.    Hematemesis Patient with vomiting that progressed to blood-tinged emesis over the past several days.  Suspect small Mallory-Weiss tear due to frequent retching.  For GI prophylaxis, Protonix infusion  initiated in the ED.   Hematemesis resolved on day 1 of admission.  Hemoglobin remained reassuring throughout his admission. He was transitioned to oral Protonix prior to discharge.   Hypertension Due to persistent vomiting, patient was unable to take antihypertensives.  On arrival SBP's were in the 170s.  Permissive hypertension was allowed for 72 hours for subacute stroke.    Acute on chronic CKD stage IV Creatinine on admission 4.24, likely prerenal in the setting of dehydration due to vomiting.  Creatinine returned to baseline prior to discharge.  Patient to follow up outpatient with [facility].  Fever Patient had isolated fever of 100.5 F on 01/12/20.  Leukocytosis (WBC 25.4) likely due to leukemoid reaction secondary to DKA or hemoconcentration secondary to dehydration.  Blood cultures have no growth to date and urine cultures had insignificant growth.  Lactic acid was reassuring at 0.7.  No empiric antibiotics were started.   Issues for Follow Up:  1. Continue titrating patient's basal (Lantis) and short-acting insulin (Novolog).  2. Patient with permissive hypertension for the next few days.  We discontinued Clonidine. If needed, consider clonidine patch instead tablets. He should resume all other antihypertensive medications. 3. UDS was positive for THC.  Continue talking with the patient about the risk of THC use and stroke.  TCM note from CM RN: Note  Transition Care Management Follow-up Telephone Call  Call completed with patient's mother, Harriett.  DPR on file to speak with her.    Date of discharge and from where: 01/13/2020, [facility]   How have you been since you were released from the hospital? His mother reported that he is tired but doing well. Nausea has improved. She said that pharmacist recommended dramamine for the nausea and that has helped him and he is starting to eat again.   Any questions or concerns? No questions/ concerns at this time    Items Reviewed:  Did the pt receive and understand the discharge instructions provided? She has the instructions and has no questions.   Medications obtained and verified? yes, he has all medications including the new meds.  His mother is aware of the medications that have been discontinued.  She said that Saturday, 01/14/2020 he was " wiped out" and he attributed that to the lantus so yesterday, he split the lantus dose and took  7.5 units in the morning and 7.5 units in the evening.  She does realize that he may just be tired as a result of the hospitalization.   Any new allergies since your discharge? none reported   Do you have support at home? Lives with his parents  Other (ie: DME, Home Health, etc) no home health ordered.   Has glucometer, his mother said that his blood sugars usually run in  the low 200s  Functional Questionnaire: (I = Independent and D = Dependent) ADL's: independent, mother helps oversee medication regime   Follow up appointments reviewed:    PCP Hospital f/u appt confirmed? Appointment scheduled with Dr Joya Gaskins 01/23/2020 @ [facility] f/u appt confirmed? Nephrology- 01/25/2020, mother will call to schedule endocrinology and neurology appts  Are transportation arrangements needed? No, parents provide transportation  If their condition worsens, is the pt aware to call  their PCP or go to the ED?yes  Was the patient provided with contact information for the PCP's office or ED? Yes. His mother has the phone number for the clinic  Was the pt encouraged to call back with questions or concerns?yes  Since discharge the patient knows he does have a nephrology appointment coming up on 27 January but is still pending endocrine and neurology.  He states his nausea has resolved.  He also has yet to achieve a retinal appointment with Dr. Zigmund Daniel.  In looking at the patient's medication regimen it appears the NPH dosing he was on failed the patient  and he will need more consistent insulin dosing.  Note since discharge the patient has been on the Lantus dosing and this appears to have caused improved control of the diabetes and he is on a 15 unit daily dosing of this and 5 units 3 times daily of NovoLog at meals    04/03/2020 Cerebrovascular accident (CVA) ([place]) Recurrent stroke with findings in the cerebellar area  Plan will be to continue Plavix 75 mg daily and continue aspirin 81 mg daily for a total of 3 weeks  We did achieve a follow-up appoint with neurology on February 16  HTN (hypertension) Hypertension with fair control will continue Coreg 25 mg twice daily, amlodipine 10 mg daily, and hydralazine 10 mg every 8 hours may need further dose adjustments as blood pressure today was 152/98  Poorly controlled type 1 diabetes mellitus ([place]) Poor control of type 1 diabetes and the patient may be a candidate for insulin pump  In the interim will increase Lantus to 17 units daily and 5 units 3 times daily of NovoLog  I will work on getting a sooner appointment than September for this patient for endocrinology  Proliferative diabetic retinopathy associated with type 1 diabetes mellitus ([place]) Proliferative diabetic neuropathy associated with type 1 diabetes will work on getting a retinal appointment  Acute renal failure superimposed on stage 4 chronic kidney disease ([place]) Acute on chronic renal failure stage IV renal disease  Plan will be to follow-up with nephrology with appointment on January 27  Tobacco use disorder Further smoking cessation counseling was given to the patient at this visit   [place] was seen today for hospitalization follow-up and diabetes.  Diagnoses and all orders for this visit:  Type 1 diabetes mellitus with stage 3 chronic kidney disease, unspecified whether stage 3a or 3b CKD (HCC) -     POCT glucose (manual entry) -     Comprehensive metabolic panel -     CBC with Differential/Platelet; Future -  CBC with Differential/Platelet  Essential hypertension -     Comprehensive metabolic panel -     CBC with Differential/Platelet; Future -     CBC with Differential/Platelet  Acute renal failure with acute renal cortical necrosis superimposed on stage 4 chronic kidney disease (HCC) -     CBC with Differential/Platelet; Future -     CBC with Differential/Platelet  Has seen endocrine and neurology since last ov ?EYE?    Past Medical History:  Diagnosis Date  . AKI (acute kidney injury) ([place])   . Cerebral infarction ([place])   . Hypertension   . Neurologic deficit due to acute ischemic cerebrovascular accident (CVA) ([place]) 06/13/2019  . Supraventricular tachycardia ([place])   . Type 1 diabetes (HCC)      Family History  Problem Relation Age of Onset  . Hypertension Mother   . Diabetes Mother   . Hyperlipidemia Mother   . Diabetes Father      Social History   Socioeconomic History  . Marital status: Single    Spouse name: Not on file  . Number of children: Not on file  . Years of education: Not on file  . Highest education level: Not on file  Occupational History  . Not on file  Tobacco Use  . Smoking status: Current Every Day Smoker    Packs/day: 0.25    Types: Cigarettes    Start date: 12/29/2010  . Smokeless tobacco: Never Used  Substance and Sexual Activity  . Alcohol use: Not on file    Comment: 6 beers/week  . Drug use: Not Currently  . Sexual activity: Not Currently  Other Topics Concern  . Not on file  Social History Narrative  . Not on file   Social Determinants of Health   Financial Resource Strain:   . Difficulty of Paying Living Expenses:   Food Insecurity:   . Worried About Charity fundraiser in the Last Year:   . Arboriculturist in the Last Year:   Transportation Needs:   . Film/video editor (Medical):   Marland Kitchen Lack of Transportation (Non-Medical):   Physical Activity:   . Days of Exercise per Week:   . Minutes of Exercise per Session:    Stress:   . Feeling of Stress :   Social Connections:   . Frequency of Communication with Friends and Family:   . Frequency of Social Gatherings with Friends and Family:   . Attends Religious Services:   . Active Member of Clubs or Organizations:   . Attends Archivist Meetings:   Marland Kitchen Marital Status:   Intimate Partner Violence:   . Fear of Current or Ex-Partner:   . Emotionally Abused:   Marland Kitchen Physically Abused:   . Sexually Abused:      No Known Allergies   Outpatient Medications Prior to Visit  Medication Sig Dispense Refill  . acetaminophen (TYLENOL) 325 MG tablet Take 650 mg by mouth every 6 (six) hours as needed for headache.    Marland Kitchen amLODipine (NORVASC) 10 MG tablet Take 1 tablet (10 mg total) by mouth daily. 30 tablet 5  . carvedilol (COREG) 25 MG tablet Take 1 tablet (25 mg total) by mouth 2 (two) times daily with a meal. 180 tablet 1  . ferrous sulfate 325 (65 FE) MG tablet Take 1 tablet (325 mg total) by mouth daily with breakfast. 30 tablet 6  . glucose blood (ONETOUCH VERIO) test strip Use as instructed to check blood sugar 3 times daily.  100 each 11  . hydrALAZINE (APRESOLINE) 25 MG tablet Take 25 mg by mouth 3 (three) times daily.    . insulin aspart (NOVOLOG FLEXPEN) 100 UNIT/ML FlexPen Inject 5 Units into the skin 3 (three) times daily with meals. 3 mL 3  . insulin glargine (LANTUS) 100 UNIT/ML injection Inject 0.17 mLs (17 Units total) into the skin daily. 10 mL 0  . Insulin Pen Needle (PEN NEEDLES 3/16") 31G X 5 MM MISC To inject insulin with novolog flex pen, 3 times daily with meals. 100 each 1  . pantoprazole (PROTONIX) 40 MG tablet Take 1 tablet (40 mg total) by mouth daily. For stomach protection 30 tablet 11  . rosuvastatin (CRESTOR) 20 MG tablet Take 1 tablet (20 mg total) by mouth daily. 30 tablet 4   No facility-administered medications prior to visit.    Review of Systems  Constitutional: Negative for activity change, appetite change, chills,  diaphoresis, fatigue, fever and unexpected weight change.  HENT: Negative.   Eyes: Negative for visual disturbance.  Respiratory: Negative.   Cardiovascular: Negative.  Negative for palpitations.  Gastrointestinal: Negative for constipation.  Endocrine: Negative.   Genitourinary: Negative.   Musculoskeletal: Negative.   Neurological: Negative for dizziness, tremors, seizures, syncope, facial asymmetry, speech difficulty, light-headedness, numbness and headaches.  Hematological: Negative for adenopathy. Does not bruise/bleed easily.  Psychiatric/Behavioral: Negative.        Objective:   Physical Exam There were no vitals filed for this visit. Gen: Pleasant, well-nourished, in no distress,  normal affect  ENT: No lesions,  mouth clear,  oropharynx clear, no postnasal drip  Neck: No JVD, no TMG, no carotid bruits  Lungs: No use of accessory muscles, no dullness to percussion, clear without rales or rhonchi  Cardiovascular: RRR, heart sounds normal, no murmur or gallops, no peripheral edema  Abdomen: soft and NT, no HSM,  BS normal  Musculoskeletal: No deformities, no cyanosis or clubbing  Neuro: alert, non focal  Skin: Warm, no lesions or rashes   BMP Latest Ref Rng & Units 01/23/2020 01/13/2020 01/13/2020  Glucose 65 - 99 mg/dL 114(H) 184(H) 148(H)  BUN 6 - 20 mg/dL 35(H) 24(H) 25(H)  Creatinine 0.76 - 1.27 mg/dL 3.25(H) 2.83(H) 2.91(H)  BUN/Creat Ratio 9 - 20 11 - -  Sodium 134 - 144 mmol/L 142 137 137  Potassium 3.5 - 5.2 mmol/L 4.9 4.0 3.8  Chloride 96 - 106 mmol/L 103 109 109  CO2 20 - 29 mmol/L 23 22 22   Calcium 8.7 - 10.2 mg/dL 9.6 8.2(L) 8.0(L)   CBC Latest Ref Rng & Units 01/23/2020 01/11/2020 01/11/2020  WBC 3.4 - 10.8 x10E3/uL 11.7(H) 25.4(H) -  Hemoglobin 13.0 - 17.7 g/dL 14.5 11.8(L) 12.6(L)  Hematocrit 37.5 - 51.0 % 43.4 34.5(L) 37.0(L)  Platelets 150 - 450 x10E3/uL 348 203 -       Assessment & Plan:  I personally reviewed all images and lab data in the  [facility] system as well as any outside material available during this office visit and agree with the  radiology impressions.   No problem-specific Assessment & Plan notes found for this encounter.   There are no diagnoses linked to this encounter.  We will be following up CBC and metabolic panel at this visit  We will endeavor to move up the patient's endocrine appointment and also get the patient connected with retinal specialist  This was a complex visit requiring multiple disciplines including renal, neurology, endocrinology, and cardiology with the patient's hypertension  Also required 30  minutes reviewing the patient's records  This is a high risk patient is at risk for readmissions

## 2020-04-03 NOTE — Telephone Encounter (Signed)
Medication is not on current list and patient was a no show to his appointment. Will forward this to his provider for review.

## 2020-04-03 NOTE — Telephone Encounter (Signed)
1) Medication(s) Requested (by name):clopidogrel (PLAVIX) 75 MG tablet [[id number]   2) [id number] Lorina Rabon, [place]  [address] [place], [place] [address]  Phone:  [phone] Fax:  [phone]   3) Special Requests:   Approved medications will be sent to the pharmacy, we will reach out if there is an issue.  Requests made after 3pm may not be addressed until the following business day!  If a patient is unsure of the name of the medication(s) please note and ask patient to call back when they are able to provide all info, do not send to responsible party until all information is available!

## 2020-04-03 NOTE — Addendum Note (Signed)
Addended by: Asencion Noble E on: 04/03/2020 04:54 PM   Modules accepted: Orders

## 2020-04-10 IMAGING — DX PORTABLE CHEST - 1 VIEW
1 series · 1 of 1 positions shown · non-contrast
Comparison: June 13, 2019

CLINICAL DATA: Respiratory failure

EXAM:
PORTABLE CHEST 1 VIEW

[chest]
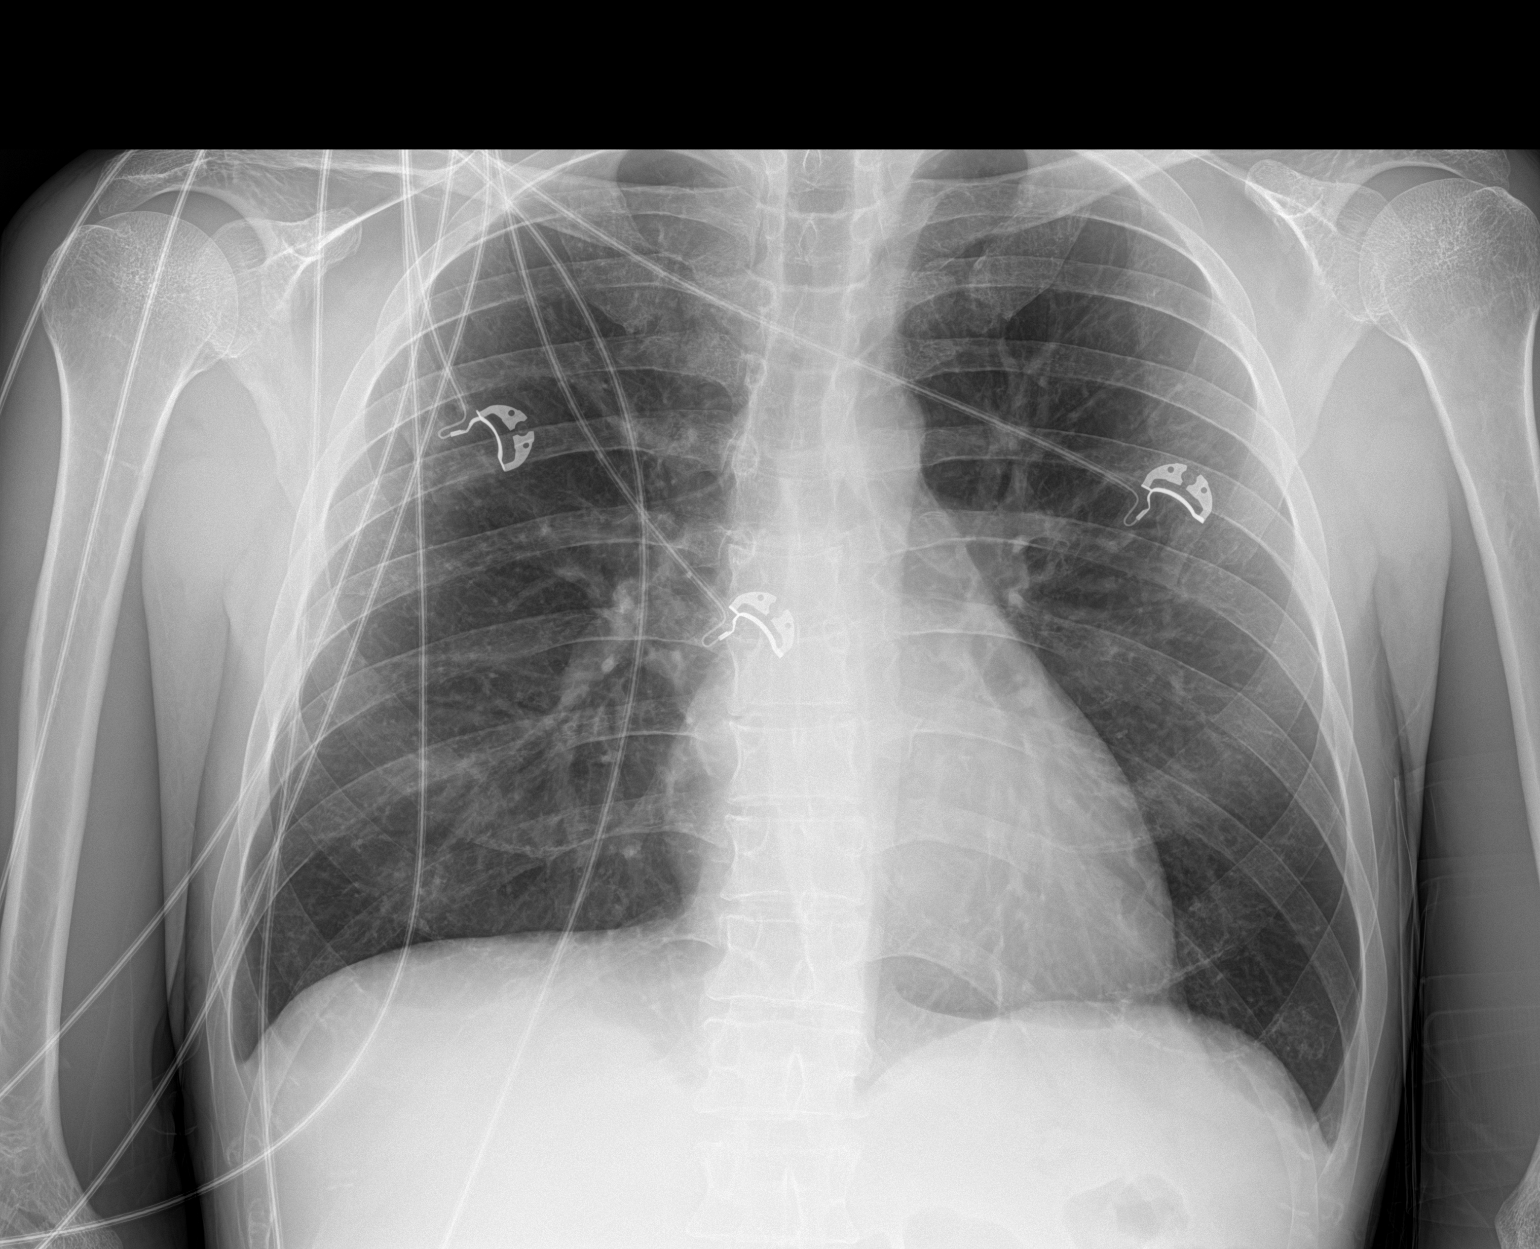

[1 of 1 positions shown; findings below may reference images not displayed]

FINDINGS: The heart size and mediastinal contours are within normal limits.
Both lungs are clear. The visualized skeletal structures are
unremarkable.
IMPRESSION: No active disease.

## 2020-04-16 NOTE — Progress Notes (Signed)
Subjective:    Patient ID: George [name], male    DOB: [birth year], 34 y.o.   MRN: [id number] History of Present Illness: This is a 34 year old male has had previous history of cerebral infarction, type 1 diabetes, hypertension, tobacco use, supraventricular tachycardia, chronic kidney disease.  Since the last visit the patient has improved somewhat.  Labs did come back showing normal thyroid function and cholesterol at goal.  The patient's blood sugars have been reasonably well-controlled running fasting levels in the 1 20-1 60 range.  Note today the CBG is 180 blood pressure has come down but still not yet at goal today 160/112  The patient has yet to see speech pathology for his aphasia.  The barrier there was the patient did not have insurance and I indicated to the scheduler the patient can be self-pay  The patient has been able to reduce tobacco use down to 3 to 5 cigarettes daily and has yet to pick up the nicotine lozenges   09/19/2019 This is a follow-up visit for this 34 year old male with type 1 diabetes, hypertension, tobacco use, chronic kidney disease, and previous history of cerebral infarction.  The patient states apart from some fatigue he is doing fairly well.  He is now smoking 2 packs of cigarettes a week.  His blood pressure at home ranges been 130-135/80-85 range.  His blood sugars have been in the 1 20-1 50s with average is 1 30-1 40 on his meter.  His sugar will go down as low as 60s before dinner.  The patient does have history of receptive a aphasia and notes when he is playing his video games he will remember some names but not all the names of the characters in the game.  He has difficulty with memory retrieval.  He saw speech pathology 1 time and has not yet made a follow-up visit.    Patient did see nephrology and has a follow-up visit pending.  At the last visit the patient not receive any labs.  The patient denies any chest pain or shortness of  breath.  12/21: Since the last visit the patient was hospitalized 4 days after our visit in September.  The patient had diabetic ketoacidosis and also new areas of stroke.  Below is the discharge summary from that hospitalization  George [name] a 33 y.o.malewith medical history significant oftype 1 diabetes, hypertension, recent CVA in June with combined receptive and expressive aphasia, CKD stage III who presented here with altered mental status and reportedly has been having nausea and persistent vomiting since Wednesday. Reportedly from EDP patient was initially able to give some history but acutely became more agitated and was pulling out his IV. He was tachycardic up to 160s and had improvement of heart rate down to 120s with 10 mg of verapamil. He was also tachypneic but remained on room air. Patient was restless and agitated at the time my evaluation and per nursing report he had attempted to use his foot to remove his hand restraints. He repeatedly stated "please help me," but was unable to explain any of his symptoms to me. He was pan positive and replied yes to every review of systems questions. Also stating he has pain everywhere. When asked for his full name and where he was he only stated "[place]."  He was afebrile and hypertensive up to 170s/108. In sinus tachycardia up to 160 and mildly tachypneic on room air. CBC showed leukocytosis of 29.6 and no anemia. CMP showed glucose level 532 with  bicarb of 18 and anion gap of 24. pH was mildly alkalotic at 7.59. Lactic acid of 5 and then 3.2 following IV fluids. TSH normal. COVID testing negative. CT head shows 2 or 3 rounded oval regions of low attenuation in the cerebral hemisphere not seen on previous studies. Suggest could be small interval lacunar infarcts with the possibility of embolic disease. Findings were age-indeterminate. CT abdomen showed no acute findings. There is gallbladder sludge or stones but no findings  of acute cholecystitis.  He was seen for nausea, vomiting, and altered mental status.  He was found to be in DKA.  He improved with insulin gtt and was transitioned to his home regimen.  He had head imaging and was found to have new strokes.  He was seen by neurology who recommended DAPT x 21 days then plavix alone.  Hospitalization also c/b AKI on CKD improving at time of discharge.  Hospital Course:  Diabetic ketoacidosis  T1DM Hypoglycemia - Unclear precipitating cause.  - He's had borderline temps, tmax of 100.2 since admission, but clinically appears to be improving and UA and CXR not suggestive of infection - follow blood cx (gram positive rods in 1/2 blood cx - repeat cx from 9/27 ngtd)  and follow urine cx (urine did have WBC, but negative LE/nitrites - no growth) - hold off on abx for now - DKA now resolved, AG 13, bicarb 22, BG173 - resumehome insulin at reduced dose 8units NPH with 3 units at meals and SSI(he's had some hypoglycemia here and he notes some at home as well - discussed importance of f/u with Dr. Joya Gaskins and monitoring BG at home) - Diabetic coordinator - A1c is 7.3 - check HbA1C  Altered mental status/delirium -delirium precautions - utox positive for MJ -resolved  Acute Infarct R Parietal Cortex  Subacute Infarct R Cerebellum  Hx CVA: -Hx L PCA infarct in June - Was on ASA/plavix and supposed to stop plavix and continue ASA alone after 9/19 (admitting provider started on plavix- have added aspirin, will deferfinal planto neuro) - Thought 2/2 cardioembolic source -> they'd planned for 30 day event monitor as echo and TEE were negative for PFO or idenifiable cardiac source of embolus and hypercoagulable/vasculitis w/u was negative per recent neuro note - will reconsult neurology in setting of acute stroke, appreciate recs- recurrent cryptogenic strokes - recommend ASA and plavix x3 weeks then plavix alone.  Of note, discharged with 81 mg ASA with  plavix, but on review of neuro note - intended to be 325 mg aspirin with plavix x21 days then plavix alone.  Called mother to correct this after discharge - she expressed understanding.  Hypertension  Tachycardia Significantly elevated BP's this AM His clonidine and coreg were being held -> which likely contributed to rebound hypertension and tachycardia Resume clonidine and coreg and amlodipine - BP still significantly elevated - will add hydralazine at d/c to help gradually normalize BP after stroke - needs close follow up with Dr. Joya Gaskins EKG with sinus tachycardia  Acute kidney injury -Creatinine of 4.11 on admission - improved to 3.02 today - good UOP - baseline creatinine ~2.2-2.4 - renal US without hydro, distended bladder noted(per nursing 9/26, bladder scan showed around 500 cc, then he voided as much) - creatinine improving - follow with nephrology as outpatient -Continue to monitor serial BMP -Avoidnephrotoxic agent  Leukocytosis: improving, follow, follow cx- given sludge vs stone in gallbladder on imaging, will follow RUQ [place] as he did have N/V on presentation - RUQ  wnl  Gram Positive Rods in 1/2 Cx: suspect contaminant, improving off abx - follow repeat cx (NGTD)  In their interim the patient's had no additional episodes of fever or DKA.  His glycemic control has been such that he occasionally runs in the 70 range early in the morning but will rebound over 200 late in the day.  It is been difficult to control with the use of long-acting NPH twice daily 8 units in the morning 12 units in the evening at meals and then associate that with short acting Humalog  The patient has since followed up with cardiology with an event monitor showing no evidence of arrhythmia therefore no evidence for embolic event.  Neurology has seen the patient and recommended switch from atorvastatin to Crestor discontinuance of a full dose aspirin after 21 days and continuing Plavix for  life  Blood pressure control continue with amlodipine clonidine and Coreg The patient also has follow-up with [facility] scheduled creatinine is in the 3 range Also an ophthalmology visit was obtained and there is apparently blood in the eyes with diabetic retinopathy  The patient does need a retinal eye referral  1/25  Since the last visit in December the patient required another admission for recurrent stroke in the cerebellar area.  The patient had ongoing nausea and went back into diabetic ketoacidosis as well.  Below is a copy of the discharge summary.  There is a follow-up appointment with nephrology upcoming and the endocrinology appointment but out until September therefore we need to intervene on that to get an earlier appointment  Also neurology appointment is pending from the hospitalization  Below is a copy of the discharge summary  Note this is a transition of care visit and below the discharge summary is the nurse case manager note    d/c summar y: Date of Admission: 01/11/2020                      Date of Discharge: 01/13/20  Admitting Physician: Lyndee Hensen, DO  Primary Care Provider: Elsie Stain, MD Consultants: Neurology   Indication for Hospitalization: DKA  Discharge Diagnoses/Problem List:  DKA in T1DM Fever Hematemesis Non-intractable nausea and vomiting Subacute CVA Acute on chronic CKD stage IV Anemia GERD  Disposition: Home   Discharge Condition: Improved   Discharge Exam:   BMW:UXLKG and conversational, in no acute distress CV: regular rate and rhythm, no murmurs appreciated RESP: no increased work of breathing, clear to ascultation bilaterally with no crackles, wheezes, or rhonchi  ABD: Bowel sounds present. Soft, Nontender, Nondistended.  MSK: no edema, or cyanosis noted SKIN: warm, dry NEURO: grossly normal, moves all extremities appropriately, alert and oriented PSYCH:Normal affect and thought content  Brief  Hospital Course:   Type 1 diabetes  nausea vomiting  DKA  hematemesis Trip Cavanagh is a 34 y.o. male presented after 3 days of vomiting that progressed to bloody emesis which prompted patient to seek care.  Mom reported patient had CBGs ranging 120-320.  On arrival, patient tachycardic (HR120s) and hypertensive (SBP 170s).  Labs concerning for mild DKA with BHB 3.73,CBG300,bicarb17, pH 7.4with elevated anion gap of 24.  He was given 2 L bolus in the ED. DKA protocol initiated and patient started on insulin drip and IV potassium.    Patient was transitioned to SQ insulin on 01/12/20.  Etiology of DKA unclear however patient did have of subacute stroke on CT head which could have been the precipitating factor for his  DKA. Overall, Pt's diabetes is controlled as A1c was 7.2.  Mom and patient wanted to start the hospital regimen of Lantus and NovoLog in the outpatient setting.  Patient was discharged with 15 Lantus and 3 units (Novolog) mealtime coverage.  Patient has a pending referral for [facility] endocrinology.   Acute encephalopathy   subacute CVA Perfecto had inappropriate answers to orientation questions (What year is it "8th",Whats your name "august 12th",Where are you " working on it") thus CT head was obtained.  CT head indicated 2 cm subacute infarct in the cerebellum that was not present on previous imaging.  Patient with history of 2 ischemic strokes in the past 6 months.  Neurology was consulted and recommended dual antiplatelet therapy for the next 3 weeks followed by monotherapy with Plavix thereafter.  MRA head and neck obtained did not show any emergent findings of flow limiting stenosis.  Patient to follow-up with stroke clinic at [facility] neurology patient in 4 weeks.    Hematemesis Patient with vomiting that progressed to blood-tinged emesis over the past several days.  Suspect small Mallory-Weiss tear due to frequent retching.  For GI prophylaxis, Protonix infusion  initiated in the ED.   Hematemesis resolved on day 1 of admission.  Hemoglobin remained reassuring throughout his admission. He was transitioned to oral Protonix prior to discharge.   Hypertension Due to persistent vomiting, patient was unable to take antihypertensives.  On arrival SBP's were in the 170s.  Permissive hypertension was allowed for 72 hours for subacute stroke.    Acute on chronic CKD stage IV Creatinine on admission 4.24, likely prerenal in the setting of dehydration due to vomiting.  Creatinine returned to baseline prior to discharge.  Patient to follow up outpatient with [facility].  Fever Patient had isolated fever of 100.5 F on 01/12/20.  Leukocytosis (WBC 25.4) likely due to leukemoid reaction secondary to DKA or hemoconcentration secondary to dehydration.  Blood cultures have no growth to date and urine cultures had insignificant growth.  Lactic acid was reassuring at 0.7.  No empiric antibiotics were started.   Issues for Follow Up:  1. Continue titrating patient's basal (Lantis) and short-acting insulin (Novolog).  2. Patient with permissive hypertension for the next few days.  We discontinued Clonidine. If needed, consider clonidine patch instead tablets. He should resume all other antihypertensive medications. 3. UDS was positive for THC.  Continue talking with the patient about the risk of THC use and stroke.  TCM note from CM RN: Note  Transition Care Management Follow-up Telephone Call  Call completed with patient's mother, Harriett.  DPR on file to speak with her.    Date of discharge and from where: 01/13/2020, [facility]   How have you been since you were released from the hospital? His mother reported that he is tired but doing well. Nausea has improved. She said that pharmacist recommended dramamine for the nausea and that has helped him and he is starting to eat again.   Any questions or concerns? No questions/ concerns at this time    Items Reviewed:  Did the pt receive and understand the discharge instructions provided? She has the instructions and has no questions.   Medications obtained and verified? yes, he has all medications including the new meds.  His mother is aware of the medications that have been discontinued.  She said that Saturday, 01/14/2020 he was " wiped out" and he attributed that to the lantus so yesterday, he split the lantus dose and took  7.5 units in the morning and 7.5 units in the evening.  She does realize that he may just be tired as a result of the hospitalization.   Any new allergies since your discharge? none reported   Do you have support at home? Lives with his parents  Other (ie: DME, Home Health, etc) no home health ordered.   Has glucometer, his mother said that his blood sugars usually run in  the low 200s  Functional Questionnaire: (I = Independent and D = Dependent) ADL's: independent, mother helps oversee medication regime   Follow up appointments reviewed:    PCP Hospital f/u appt confirmed? Appointment scheduled with Dr Joya Gaskins 01/23/2020 @ [facility] f/u appt confirmed? Nephrology- 01/25/2020, mother will call to schedule endocrinology and neurology appts  Are transportation arrangements needed? No, parents provide transportation  If their condition worsens, is the pt aware to call  their PCP or go to the ED?yes  Was the patient provided with contact information for the PCP's office or ED? Yes. His mother has the phone number for the clinic  Was the pt encouraged to call back with questions or concerns?yes  Since discharge the patient knows he does have a nephrology appointment coming up on 27 January but is still pending endocrine and neurology.  He states his nausea has resolved.  He also has yet to achieve a retinal appointment with Dr. Zigmund Daniel.  In looking at the patient's medication regimen it appears the NPH dosing he was on failed the patient  and he will need more consistent insulin dosing.  Note since discharge the patient has been on the Lantus dosing and this appears to have caused improved control of the diabetes and he is on a 15 unit daily dosing of this and 5 units 3 times daily of NovoLog at meals    04/03/2020>>no showed.  4/20 since last OV which actually occurred in January 2021 the patient has been to endocrinology and also neurology.  He has had prior history of recurrent strokes in the cerebellar area and plan previously had been to continue Plavix 75 mg daily and aspirin 81 mg daily for a total therapy of 3 weeks and neurology then saw the patient in February:  From that visit the following recommendations were given by neurology   1. Multiple recurrent strokes:  Continue clopidogrel 75 mg daily and Crestor 20 mg daily for secondary stroke prevention. Maintain strict control of hypertension with blood pressure goal below 130/90, diabetes with hemoglobin A1c goal below 6.5% and cholesterol with LDL cholesterol (bad cholesterol) goal below 70 mg/dL.  I also advised the patient to eat a healthy diet with plenty of whole grains, cereals, fruits and vegetables, exercise regularly with at least 30 minutes of continuous activity daily and maintain ideal body weight. 2. HTN: Advised to continue current treatment regimen.  Encouraged to monitor blood pressure at home routinely to ensure satisfactory management.  Advised him to follow-up with cardiology or PCP for ongoing management and monitoring 3. HLD: Continue Crestor 20 mg daily and ongoing monitoring management by PCP 4. DMI: Advised to continue to monitor glucose levels at home along with continued follow-up with endocrinology for management and monitoring 5. CKD stage III: Continue to follow nephrology as scheduled 6. Visual impairment: post versus diabetic retinopathy.  Highly encouraged ensuring evaluation by retina specialist for further evaluation 7. Tobacco/THC use:  Discussion with patient regarding importance of cessation due to increased risk of reoccurring stroke and cardiac disease with ongoing  use.  He verbalized understanding.  Note the patient does have a retinal specialist visit scheduled in May.  Patient also has follow-up with renal in May along with endocrinology as well.  Patient notes his blood pressures at home in the 130 over 90s range but here in the office remain elevated.  His blood glucoses often are as low as 93-100 but after eating and her first thing in the morning they can run as high as 380.  He appears to be skipping his evening doses of NovoLog.  He is concerned about with his gastroparesis he may become low but his low sugars only been in the low 80s.  The patient continues to smoke a cigarette air or 2 every 2 days.  He knows he needs to stop smoking completely  At the February endocrine visit the following recommendations were given   I explained to the patient that diabetic patients are at higher than normal risk for amputations.  His A1c is falsely skewed downwards due to CKD  I have praised the patient on frequent glucose checks and having that data available to me today, and review of his meter download he is noted to have fluctuating BG's ranging from severe hyperglycemia to hypoglycemia, patient admits to dietary indiscretions, he also tends to snack sometimes without a prandial coverage patient also tends to bolus after a meal, patient also tends to get sometimes on the correction dose, we discussed the risk of hypoglycemia especially in the setting of CKD 4.   I have recommended that he takes 2 units of NovoLog with snacks, I have explained to him that if he eats anything with carbohydrates and no prandial coverage he will have hypoglycemia.  I will also give him a correction scale so he does not guess on a correction dose.  I have recommended the following adjustments to his regimen,   MEDICATIONS: Continue lantus 17  units daily Novolog 5 units with each meal NovoLog 2 units with a snack CF: NovoLog (BG-130/55)   EDUCATION / INSTRUCTIONS:  BG monitoring instructions: Patient is instructed to check his blood sugars 4 times a day, before meals and bedtime.  Call [facility] if: BG persistently < 70 or > 300.  I reviewed the Rule of 15 for the treatment of hypoglycemia in detail with the patient. Literature supplied.   2) Diabetic complications:   Eye: Does  have known diabetic retinopathy.   Neuro/ Feet: Does night have known diabetic peripheral neuropathy.  Renal: Patient does  have known baseline CKD. He is not on an ACEI/ARB at present.  3) Lipids: Patient is on a statin.    4) Hypertension: He is above goal of < 140/90 mmHg.  Will defer further management to his PCP   Note today his CBG is 144 today 144 Note the patient did receive his Covid vaccine series with [place] ending April 13  Past Medical History:  Diagnosis Date  . AKI (acute kidney injury) ([place])   . Cerebral infarction ([place])   . Hypertension   . Neurologic deficit due to acute ischemic cerebrovascular accident (CVA) ([place]) 06/13/2019  . Supraventricular tachycardia ([place])   . Type 1 diabetes ([facility])      Family History  Problem Relation Age of Onset  . Hypertension Mother   . Diabetes Mother   . Hyperlipidemia Mother   . Diabetes Father      Social History   Socioeconomic History  . Marital status: Single    Spouse name: Not  on file  . Number of children: Not on file  . Years of education: Not on file  . Highest education level: Not on file  Occupational History  . Not on file  Tobacco Use  . Smoking status: Current Every Day Smoker    Packs/day: 0.25    Types: Cigarettes    Start date: 12/29/2010  . Smokeless tobacco: Never Used  Substance and Sexual Activity  . Alcohol use: Not on file    Comment: 6 beers/week  . Drug use: Not Currently  . Sexual activity: Not Currently  Other  Topics Concern  . Not on file  Social History Narrative  . Not on file   Social Determinants of Health   Financial Resource Strain:   . Difficulty of Paying Living Expenses:   Food Insecurity:   . Worried About Charity fundraiser in the Last Year:   . Arboriculturist in the Last Year:   Transportation Needs:   . Film/video editor (Medical):   Marland Kitchen Lack of Transportation (Non-Medical):   Physical Activity:   . Days of Exercise per Week:   . Minutes of Exercise per Session:   Stress:   . Feeling of Stress :   Social Connections:   . Frequency of Communication with Friends and Family:   . Frequency of Social Gatherings with Friends and Family:   . Attends Religious Services:   . Active Member of Clubs or Organizations:   . Attends Archivist Meetings:   Marland Kitchen Marital Status:   Intimate Partner Violence:   . Fear of Current or Ex-Partner:   . Emotionally Abused:   Marland Kitchen Physically Abused:   . Sexually Abused:      No Known Allergies   Outpatient Medications Prior to Visit  Medication Sig Dispense Refill  . acetaminophen (TYLENOL) 325 MG tablet Take 650 mg by mouth every 6 (six) hours as needed for headache.    . clopidogrel (PLAVIX) 75 MG tablet Take 1 tablet (75 mg total) by mouth daily. 90 tablet 3  . glucose blood (ONETOUCH VERIO) test strip Use as instructed to check blood sugar 3 times daily. 100 each 11  . insulin aspart (NOVOLOG FLEXPEN) 100 UNIT/ML FlexPen Inject 5 Units into the skin 3 (three) times daily with meals. 3 mL 3  . pantoprazole (PROTONIX) 40 MG tablet Take 1 tablet (40 mg total) by mouth daily. For stomach protection 30 tablet 11  . amLODipine (NORVASC) 10 MG tablet Take 1 tablet (10 mg total) by mouth daily. 30 tablet 5  . carvedilol (COREG) 25 MG tablet Take 1 tablet (25 mg total) by mouth 2 (two) times daily with a meal. 180 tablet 1  . ferrous sulfate 325 (65 FE) MG tablet Take 1 tablet (325 mg total) by mouth daily with breakfast. 30 tablet 6   . hydrALAZINE (APRESOLINE) 25 MG tablet Take 25 mg by mouth 3 (three) times daily.    . insulin glargine (LANTUS) 100 UNIT/ML injection Inject 0.17 mLs (17 Units total) into the skin daily. 10 mL 0  . Insulin Pen Needle (PEN NEEDLES 3/16") 31G X 5 MM MISC To inject insulin with novolog flex pen, 3 times daily with meals. 100 each 1  . rosuvastatin (CRESTOR) 20 MG tablet Take 1 tablet (20 mg total) by mouth daily. 30 tablet 4   No facility-administered medications prior to visit.    Review of Systems  Constitutional: Negative for activity change, appetite change, chills, diaphoresis, fatigue, fever  and unexpected weight change.  HENT: Negative.   Eyes: Negative for visual disturbance.  Respiratory: Negative.   Cardiovascular: Negative.  Negative for palpitations.  Gastrointestinal: Negative for constipation.  Endocrine: Negative.   Genitourinary: Negative.   Musculoskeletal: Negative.   Neurological: Negative for dizziness, tremors, seizures, syncope, facial asymmetry, speech difficulty, light-headedness, numbness and headaches.  Hematological: Negative for adenopathy. Does not bruise/bleed easily.  Psychiatric/Behavioral: Negative.        Objective:   Physical Exam Vitals:   04/17/20 0927 04/17/20 0932  BP: (!) 171/91 (!) 164/91  Pulse: 85   Temp: 97.9 F (36.6 C)   TempSrc: Temporal   SpO2: 100%   Weight: 127 lb 3.2 oz (57.7 kg)   Height: 5\' 4"  (1.626 m)    Gen: Pleasant, well-nourished, in no distress,  normal affect  ENT: No lesions,  mouth clear,  oropharynx clear, no postnasal drip, poor dentition  Neck: No JVD, no TMG, no carotid bruits  Lungs: No use of accessory muscles, no dullness to percussion, clear without rales or rhonchi  Cardiovascular: RRR, heart sounds normal, no murmur or gallops, no peripheral edema  Abdomen: soft and NT, no HSM,  BS normal  Musculoskeletal: No deformities, no cyanosis or clubbing  Neuro: alert, non focal  Skin: Warm, no  lesions or rashes   BMP Latest Ref Rng & Units 01/23/2020 01/13/2020 01/13/2020  Glucose 65 - 99 mg/dL 114(H) 184(H) 148(H)  BUN 6 - 20 mg/dL 35(H) 24(H) 25(H)  Creatinine 0.76 - 1.27 mg/dL 3.25(H) 2.83(H) 2.91(H)  BUN/Creat Ratio 9 - 20 11 - -  Sodium 134 - 144 mmol/L 142 137 137  Potassium 3.5 - 5.2 mmol/L 4.9 4.0 3.8  Chloride 96 - 106 mmol/L 103 109 109  CO2 20 - 29 mmol/L 23 22 22   Calcium 8.7 - 10.2 mg/dL 9.6 8.2(L) 8.0(L)   CBC Latest Ref Rng & Units 01/23/2020 01/11/2020 01/11/2020  WBC 3.4 - 10.8 x10E3/uL 11.7(H) 25.4(H) -  Hemoglobin 13.0 - 17.7 g/dL 14.5 11.8(L) 12.6(L)  Hematocrit 37.5 - 51.0 % 43.4 34.5(L) 37.0(L)  Platelets 150 - 450 x10E3/uL 348 203 -       Assessment & Plan:  I personally reviewed all images and lab data in the [facility] system as well as any outside material available during this office visit and agree with the  radiology impressions.   History of CVA (cerebrovascular accident) History of recurrent cerebellar strokes not active at this time  Patient to continue Plavix 75 mg daily but now is off aspirin therapy  Continue strict control of lipids with an LDL goal of less than 70 thus continuing lovastatin as prescribed  Need to get the hemoglobin A1c down to lower 6 range and blood pressure control needs to be less than 130/80  Tobacco use disorder    . Current smoking consumption amount: 1-3 cigs every 2 days  . Dicsussion on advise to quit smoking and smoking impacts: impact on renal and neuro health  . Patient's willingness to quit:    Methods to quit smoking discussed:  Further abstinence, behavioral mod  . Medication management of smoking session drugs discussed:  Not a candidate   . Setting quit date two weeks  . Follow-up arranged two month   Time spent counseling the patient:  10 min    Type 1 diabetes mellitus with hyperglycemia ([place]) We will continue current Lantus dosing as prescribed and NovoLog with exception of an additional  4 to 5 units at bedtime  We  will follow-up hemoglobin A1c at next visit  The patient has an upcoming ophthalmologic exam with retinal specialist he is encouraged to keep this visit  Hyperlipidemia due to type 1 diabetes mellitus ([place]) Hyperlipidemia with type 1 diabetes continue Rova statin with goal of LDL less than 70  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 ([place]) Type 1 diabetes with retinopathy patient encouraged to keep ophthalmology upcoming appointment with retina specialist  Type 1 diabetes mellitus with stage 4 chronic kidney disease ([place]) Patient now stage IV chronic kidney disease with type 1 diabetes  Patient has upcoming appointment with nephrology  HTN (hypertension) Hypertension poorly controlled we will increase hydralazine to 50 mg 3 times daily  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 -     CBC with Differential/Platelet  Type 1 diabetes mellitus with stage 3 chronic kidney disease, unspecified whether stage 3a or 3b CKD ([place]) -     Comprehensive metabolic panel -     CBC with Differential/Platelet; Future -     Glucose (CBG) -     CBC with Differential/Platelet  Hyperlipidemia due to type 1 diabetes mellitus (HCC) -     Lipid panel  Proliferative diabetic retinopathy associated with type 1 diabetes mellitus, unspecified laterality, unspecified proliferative retinopathy type (HCC)  Tobacco use disorder  History of CVA (cerebrovascular accident)  Type 1 diabetes mellitus with hyperglycemia (HCC)  Type 1 diabetes mellitus with retinopathy of left  eye, macular edema presence unspecified, unspecified retinopathy severity (HCC)  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 -     hydrALAZINE (APRESOLINE) 50 MG tablet; Take 1 tablet (50 mg total) by mouth 3 (three) times daily. -     insulin glargine (LANTUS) 100 UNIT/ML injection; Inject 0.17 mLs (17 Units total) into the skin daily. -     Insulin Pen Needle (PEN NEEDLES 3/16") 31G X 5 MM MISC; To inject insulin with novolog flex pen, 3 times daily with meals. -     rosuvastatin (CRESTOR) 20 MG tablet; Take 1 tablet (20 mg total) by mouth daily.

## 2020-04-17 ENCOUNTER — Other Ambulatory Visit: Payer: Self-pay

## 2020-04-17 ENCOUNTER — Encounter: Payer: Self-pay | Admitting: Critical Care Medicine

## 2020-04-17 ENCOUNTER — Ambulatory Visit: Payer: Medicaid Other | Attending: Critical Care Medicine | Admitting: Critical Care Medicine

## 2020-04-17 VITALS — BP 164/91 | HR 85 | Temp 97.9°F | Ht 64.0 in | Wt 127.2 lb

## 2020-04-17 DIAGNOSIS — E1065 Type 1 diabetes mellitus with hyperglycemia: Secondary | ICD-10-CM | POA: Diagnosis not present

## 2020-04-17 DIAGNOSIS — Z8349 Family history of other endocrine, nutritional and metabolic diseases: Secondary | ICD-10-CM | POA: Insufficient documentation

## 2020-04-17 DIAGNOSIS — N184 Chronic kidney disease, stage 4 (severe): Secondary | ICD-10-CM | POA: Insufficient documentation

## 2020-04-17 DIAGNOSIS — I129 Hypertensive chronic kidney disease with stage 1 through stage 4 chronic kidney disease, or unspecified chronic kidney disease: Secondary | ICD-10-CM | POA: Insufficient documentation

## 2020-04-17 DIAGNOSIS — F17210 Nicotine dependence, cigarettes, uncomplicated: Secondary | ICD-10-CM | POA: Diagnosis not present

## 2020-04-17 DIAGNOSIS — F17200 Nicotine dependence, unspecified, uncomplicated: Secondary | ICD-10-CM

## 2020-04-17 DIAGNOSIS — Z79899 Other long term (current) drug therapy: Secondary | ICD-10-CM | POA: Diagnosis not present

## 2020-04-17 DIAGNOSIS — E1022 Type 1 diabetes mellitus with diabetic chronic kidney disease: Secondary | ICD-10-CM

## 2020-04-17 DIAGNOSIS — E103599 Type 1 diabetes mellitus with proliferative diabetic retinopathy without macular edema, unspecified eye: Secondary | ICD-10-CM

## 2020-04-17 DIAGNOSIS — E103592 Type 1 diabetes mellitus with proliferative diabetic retinopathy without macular edema, left eye: Secondary | ICD-10-CM | POA: Diagnosis not present

## 2020-04-17 DIAGNOSIS — I69320 Aphasia following cerebral infarction: Secondary | ICD-10-CM | POA: Diagnosis not present

## 2020-04-17 DIAGNOSIS — I10 Essential (primary) hypertension: Secondary | ICD-10-CM | POA: Diagnosis not present

## 2020-04-17 DIAGNOSIS — Z794 Long term (current) use of insulin: Secondary | ICD-10-CM | POA: Diagnosis not present

## 2020-04-17 DIAGNOSIS — Z7902 Long term (current) use of antithrombotics/antiplatelets: Secondary | ICD-10-CM | POA: Insufficient documentation

## 2020-04-17 DIAGNOSIS — E1069 Type 1 diabetes mellitus with other specified complication: Secondary | ICD-10-CM

## 2020-04-17 DIAGNOSIS — E10620 Type 1 diabetes mellitus with diabetic dermatitis: Secondary | ICD-10-CM | POA: Diagnosis not present

## 2020-04-17 DIAGNOSIS — Z8249 Family history of ischemic heart disease and other diseases of the circulatory system: Secondary | ICD-10-CM | POA: Diagnosis not present

## 2020-04-17 DIAGNOSIS — E785 Hyperlipidemia, unspecified: Secondary | ICD-10-CM

## 2020-04-17 DIAGNOSIS — E10319 Type 1 diabetes mellitus with unspecified diabetic retinopathy without macular edema: Secondary | ICD-10-CM | POA: Diagnosis not present

## 2020-04-17 DIAGNOSIS — N183 Chronic kidney disease, stage 3 unspecified: Secondary | ICD-10-CM | POA: Diagnosis not present

## 2020-04-17 DIAGNOSIS — Z833 Family history of diabetes mellitus: Secondary | ICD-10-CM | POA: Diagnosis not present

## 2020-04-17 DIAGNOSIS — Z8673 Personal history of transient ischemic attack (TIA), and cerebral infarction without residual deficits: Secondary | ICD-10-CM

## 2020-04-17 LAB — GLUCOSE, POCT (MANUAL RESULT ENTRY): POC Glucose: 108 mg/dl — AB (ref 70–99)

## 2020-04-17 MED ORDER — INSULIN GLARGINE 100 UNIT/ML ~~LOC~~ SOLN: 17.0000 [IU] | SUBCUTANEOUS | 6 refills | Status: DC

## 2020-04-17 MED ORDER — HYDRALAZINE HCL 50 MG PO TABS: 50.0000 mg | ORAL_TABLET | Freq: Three times a day (TID) | ORAL | 1 refills | Status: DC

## 2020-04-17 MED ORDER — AMLODIPINE BESYLATE 10 MG PO TABS: 10.0000 mg | ORAL_TABLET | Freq: Every day | ORAL | 2 refills | Status: DC

## 2020-04-17 MED ORDER — "PEN NEEDLES 3/16"" 31G X 5 MM MISC": 4 refills | Status: DC

## 2020-04-17 MED ORDER — ROSUVASTATIN CALCIUM 20 MG PO TABS: 20.0000 mg | ORAL_TABLET | Freq: Every day | ORAL | 4 refills | Status: DC

## 2020-04-17 MED ORDER — CARVEDILOL 25 MG PO TABS: 25.0000 mg | ORAL_TABLET | Freq: Two times a day (BID) | ORAL | 1 refills | Status: DC

## 2020-04-17 NOTE — Progress Notes (Signed)
Med refills for everything  cbg 108

## 2020-04-17 NOTE — Assessment & Plan Note (Signed)
History of recurrent cerebellar strokes not active at this time  Patient to continue Plavix 75 mg daily but now is off aspirin therapy  Continue strict control of lipids with an LDL goal of less than 70 thus continuing lovastatin as prescribed  Need to get the hemoglobin A1c down to lower 6 range and blood pressure control needs to be less than 130/80

## 2020-04-17 NOTE — Assessment & Plan Note (Signed)
  .   Current smoking consumption amount: 1-3 cigs every 2 days  . Dicsussion on advise to quit smoking and smoking impacts: impact on renal and neuro health  . Patient's willingness to quit:    Methods to quit smoking discussed:  Further abstinence, behavioral mod  . Medication management of smoking session drugs discussed:  Not a candidate   . Setting quit date two weeks  . Follow-up arranged two month   Time spent counseling the patient:  10 min

## 2020-04-17 NOTE — Assessment & Plan Note (Signed)
Hyperlipidemia with type 1 diabetes continue Rova statin with goal of LDL less than 70

## 2020-04-17 NOTE — Assessment & Plan Note (Signed)
Receptive and expressive aphasia improving

## 2020-04-17 NOTE — Assessment & Plan Note (Signed)
Type 1 diabetes with retinopathy patient encouraged to keep ophthalmology upcoming appointment with retina specialist

## 2020-04-17 NOTE — Assessment & Plan Note (Signed)
Hypertension poorly controlled we will increase hydralazine to 50 mg 3 times daily

## 2020-04-17 NOTE — Assessment & Plan Note (Signed)
We will continue current Lantus dosing as prescribed and NovoLog with exception of an additional 4 to 5 units at bedtime  We will follow-up hemoglobin A1c at next visit  The patient has an upcoming ophthalmologic exam with retinal specialist he is encouraged to keep this visit

## 2020-04-17 NOTE — Assessment & Plan Note (Signed)
Patient now stage IV chronic kidney disease with type 1 diabetes  Patient has upcoming appointment with nephrology

## 2020-04-17 NOTE — Patient Instructions (Signed)
All meds refilled  Take a 4-5 unit novolog dose at bedtime  Increase hydralazine to 50mg  three times daily  Labs today metabolic and lipid panel and blood count  Keep upcoming Eye, endocrine, kidney appointments  Return Dr Joya Gaskins 3 months

## 2020-04-18 ENCOUNTER — Telehealth: Payer: Self-pay | Admitting: Critical Care Medicine

## 2020-04-18 LAB — CBC WITH DIFFERENTIAL/PLATELET
Basophils Absolute: 0.1 10*3/uL (ref 0.0–0.2)
Basos: 1 %
EOS (ABSOLUTE): 0.6 10*3/uL — ABNORMAL HIGH (ref 0.0–0.4)
Eos: 4 %
Hematocrit: 37.3 % — ABNORMAL LOW (ref 37.5–51.0)
Hemoglobin: 12.7 g/dL — ABNORMAL LOW (ref 13.0–17.7)
Immature Grans (Abs): 0 10*3/uL (ref 0.0–0.1)
Immature Granulocytes: 0 %
Lymphocytes Absolute: 1.3 10*3/uL (ref 0.7–3.1)
Lymphs: 8 %
MCH: 30.5 pg (ref 26.6–33.0)
MCHC: 34 g/dL (ref 31.5–35.7)
MCV: 89 fL (ref 79–97)
Monocytes Absolute: 0.9 10*3/uL (ref 0.1–0.9)
Monocytes: 6 %
Neutrophils Absolute: 12.3 10*3/uL — ABNORMAL HIGH (ref 1.4–7.0)
Neutrophils: 81 %
Platelets: 222 10*3/uL (ref 150–450)
RBC: 4.17 x10E6/uL (ref 4.14–5.80)
RDW: 12.4 % (ref 11.6–15.4)
WBC: 15.2 10*3/uL — ABNORMAL HIGH (ref 3.4–10.8)

## 2020-04-18 LAB — COMPREHENSIVE METABOLIC PANEL
ALT: 15 IU/L (ref 0–44)
AST: 15 IU/L (ref 0–40)
Albumin/Globulin Ratio: 2.5 — ABNORMAL HIGH (ref 1.2–2.2)
Albumin: 4.8 g/dL (ref 4.0–5.0)
Alkaline Phosphatase: 106 IU/L (ref 39–117)
BUN/Creatinine Ratio: 8 — ABNORMAL LOW (ref 9–20)
BUN: 26 mg/dL — ABNORMAL HIGH (ref 6–20)
Bilirubin Total: 0.2 mg/dL (ref 0.0–1.2)
CO2: 21 mmol/L (ref 20–29)
Calcium: 10 mg/dL (ref 8.7–10.2)
Chloride: 103 mmol/L (ref 96–106)
Creatinine, Ser: 3.17 mg/dL — ABNORMAL HIGH (ref 0.76–1.27)
GFR calc Af Amer: 28 mL/min/{1.73_m2} — ABNORMAL LOW (ref >59)
GFR calc non Af Amer: 24 mL/min/{1.73_m2} — ABNORMAL LOW (ref >59)
Globulin, Total: 1.9 g/dL (ref 1.5–4.5)
Glucose: 77 mg/dL (ref 65–99)
Potassium: 4.6 mmol/L (ref 3.5–5.2)
Sodium: 142 mmol/L (ref 134–144)
Total Protein: 6.7 g/dL (ref 6.0–8.5)

## 2020-04-18 LAB — LIPID PANEL
Chol/HDL Ratio: 2.5 ratio (ref 0.0–5.0)
Cholesterol, Total: 118 mg/dL (ref 100–199)
HDL: 47 mg/dL (ref >39)
LDL Chol Calc (NIH): 55 mg/dL (ref 0–99)
Triglycerides: 81 mg/dL (ref 0–149)
VLDL Cholesterol Cal: 16 mg/dL (ref 5–40)

## 2020-04-18 NOTE — Telephone Encounter (Signed)
Pt aware of results 

## 2020-04-18 NOTE — Telephone Encounter (Signed)
Patient mother returned call saying the patient received a call from Dr. Joya Gaskins. Please f/u

## 2020-05-04 ENCOUNTER — Other Ambulatory Visit: Payer: Self-pay

## 2020-05-07 ENCOUNTER — Telehealth: Payer: Self-pay | Admitting: Critical Care Medicine

## 2020-05-07 ENCOUNTER — Ambulatory Visit (INDEPENDENT_AMBULATORY_CARE_PROVIDER_SITE_OTHER): Payer: Medicaid Other | Admitting: Internal Medicine

## 2020-05-07 ENCOUNTER — Other Ambulatory Visit: Payer: Self-pay

## 2020-05-07 ENCOUNTER — Encounter: Payer: Self-pay | Admitting: Internal Medicine

## 2020-05-07 VITALS — BP 154/84 | HR 86 | Temp 98.3°F | Ht 64.0 in | Wt 126.8 lb

## 2020-05-07 DIAGNOSIS — N184 Chronic kidney disease, stage 4 (severe): Secondary | ICD-10-CM

## 2020-05-07 DIAGNOSIS — E10319 Type 1 diabetes mellitus with unspecified diabetic retinopathy without macular edema: Secondary | ICD-10-CM

## 2020-05-07 DIAGNOSIS — E1065 Type 1 diabetes mellitus with hyperglycemia: Secondary | ICD-10-CM | POA: Diagnosis not present

## 2020-05-07 DIAGNOSIS — E1022 Type 1 diabetes mellitus with diabetic chronic kidney disease: Secondary | ICD-10-CM

## 2020-05-07 LAB — POCT GLYCOSYLATED HEMOGLOBIN (HGB A1C): Hemoglobin A1C: 7.3 % — AB (ref 4.0–5.6)

## 2020-05-07 MED ORDER — NOVOLOG FLEXPEN 100 UNIT/ML ~~LOC~~ SOPN: 5.0000 [IU] | PEN_INJECTOR | Freq: Three times a day (TID) | SUBCUTANEOUS | 3 refills | Status: DC

## 2020-05-07 MED ORDER — LANTUS SOLOSTAR 100 UNIT/ML ~~LOC~~ SOPN: 18.0000 [IU] | PEN_INJECTOR | Freq: Every day | SUBCUTANEOUS | 11 refills | Status: DC

## 2020-05-07 NOTE — Telephone Encounter (Signed)
1) Medication(s) Requested (by name): insulin aspart (NOVOLOG FLEXPEN) 100 UNIT/ML FlexPen    2) Pharmacy of Choice:  [facility] [place], [place], [place] [id number]   3) Special Requests: Patient mother was told that patient did not have anymore refills.    Approved medications will be sent to the pharmacy, we will reach out if there is an issue.  Requests made after 3pm may not be addressed until the following business day!  If a patient is unsure of the name of the medication(s) please note and ask patient to call back when they are able to provide all info, do not send to responsible party until all information is available!

## 2020-05-07 NOTE — Patient Instructions (Addendum)
-   Increase  lantus to 18 units daily - Novolog 5 units with each meal - Snack : Novolog: Insulin to carb ratio of 1:15   - Novolog correctional insulin: ADD extra units on insulin to your meal-time novolog dose if your blood sugars are higher than . Use the scale below to help guide you:   Blood sugar before meal Number of units to inject  Less than 185 0 unit  186-  240 1 units  241-  295 2 units  296-  350 3 units  351 -  405 4 units     -HOW TO TREAT LOW BLOOD SUGARS (Blood sugar LESS THAN 70 MG/DL)  Please follow the RULE OF 15 for the treatment of hypoglycemia treatment (when your (blood sugars are less than 70 mg/dL)    STEP 1: Take 15 grams of carbohydrates when your blood sugar is low, which includes:   3-4 GLUCOSE TABS  OR  3-4 OZ OF JUICE OR REGULAR SODA OR  ONE TUBE OF GLUCOSE GEL     STEP 2: RECHECK blood sugar in 15 MINUTES STEP 3: If your blood sugar is still low at the 15 minute recheck --> then, go back to STEP 1 and treat AGAIN with another 15 grams of carbohydrates.

## 2020-05-07 NOTE — Progress Notes (Signed)
Name: [facility]  Age/ Sex: 34 y.o., male   MRN/ DOB: [id number], 1986/10/23     PCP: Elsie Stain, MD   Reason for Endocrinology Evaluation: Type 1 Diabetes Mellitus  Initial Endocrine Consultative Visit: 02/07/2020    PATIENT IDENTIFIER: George [name] is a 34 y.o. male with a past medical history of HTN, T1DM and hx of CVA . The patient has followed with Endocrinology clinic since 2/9.2021 for consultative assistance with management of his diabetes.  DIABETIC HISTORY:  George [name] was diagnosed with T1DM at age 34. His hemoglobin A1c has ranged from 7.2% in 2021, peaking at 8.2%    SUBJECTIVE:   During the last visit (02/07/2020): A1c 7.2% ( Skewed due to CKD)  Today (05/07/2020): George [name] is here for a follow up on diabetes.  He checks his blood sugars 4 times daily, preprandial. The patient has had hypoglycemic episodes since the last clinic visit, which typically occur 2 x /month - most often occuring after a bolus. The patient is  symptomatic with these episodes.      HOME DIABETES REGIMEN:  Lantus 17 units daily  Novolog 5 units TIDQAC Novolog 2  units with snacks  CF : Novolog (Bg-130/55)     Statin: Yes ACE-I/ARB: No    METER DOWNLOAD SUMMARY: Date range evaluated: 4/27- 05/07/2020 Fingerstick Blood Glucose Tests = 60 Average Number Tests/Day = 4.3 Overall Mean FS Glucose = 199  BG Ranges: Low = 32 High = 594   Hypoglycemic Events/30 Days: BG < 50 = 2 Episodes of symptomatic severe hypoglycemia = 0    DIABETIC COMPLICATIONS: Microvascular complications:   Left retinopathy, CKD   Denies:neuropathy  Last eye exam: Completed 11/22/2019  Macrovascular complications:   CVA 0/0867 , 09/2019 and 12/2019  Denies: CAD, PVD,   HISTORY:  Past Medical History:  Past Medical History:  Diagnosis Date  . AKI (acute kidney injury) ([place])   . Cerebral  infarction (George [name])   . Hypertension   . Neurologic deficit due to acute ischemic cerebrovascular accident (CVA) ([place]) 06/13/2019  . Supraventricular tachycardia ([place])   . Type 1 diabetes (George [name])    Past Surgical History:  Past Surgical History:  Procedure Laterality Date  . BUBBLE STUDY  06/20/2019   Procedure: BUBBLE STUDY;  Surgeon: Fay Records, MD;  Location: [facility];  Service: Cardiovascular;;  . TEE WITHOUT CARDIOVERSION N/A 06/20/2019   Procedure: TRANSESOPHAGEAL ECHOCARDIOGRAM (TEE);  Surgeon: Fay Records, MD;  Location: [facility] END[facility]COPY;  Service: Cardiovascular;  Laterality: N/A;    Social History:  reports that he has been smoking cigarettes. He started smoking about 9 years ago. He has been smoking about 0.25 packs per day. He has never used smokeless tobacco. He reports previous drug use. No history on file for alcohol. Family History:  Family History  Problem Relation Age of Onset  . Hypertension Mother   . Diabetes Mother   . Hyperlipidemia Mother   . Diabetes Father      HOME MEDICATIONS: Allergies as of 05/07/2020   No Known Allergies     Medication List       Accurate as of May 07, 2020  3:35 PM. If you have any questions, ask your nurse or doctor.        acetaminophen 325 MG tablet Commonly known as: TYLENOL Take 650 mg by mouth every 6 (six) hours as needed for headache.   amLODipine 10 MG tablet Commonly known as: NORVASC Take 1 tablet (10 mg  total) by mouth daily.   carvedilol 25 MG tablet Commonly known as: COREG Take 1 tablet (25 mg total) by mouth 2 (two) times daily with a meal.   clopidogrel 75 MG tablet Commonly known as: PLAVIX Take 1 tablet (75 mg total) by mouth daily.   hydrALAZINE 50 MG tablet Commonly known as: APRESOLINE Take 1 tablet (50 mg total) by mouth 3 (three) times daily.   insulin glargine 100 UNIT/ML injection Commonly known as: LANTUS Inject 0.17 mLs (17 Units total) into the skin daily.   NovoLOG FlexPen 100  UNIT/ML FlexPen Generic drug: insulin aspart Inject 5 Units into the skin 3 (three) times daily with meals.   OneTouch Verio test strip Generic drug: glucose blood Use as instructed to check blood sugar 3 times daily.   pantoprazole 40 MG tablet Commonly known as: PROTONIX Take 1 tablet (40 mg total) by mouth daily. For stomach protection   Pen Needles 3/16" 31G X 5 MM Misc To inject insulin with novolog flex pen, 3 times daily with meals.   rosuvastatin 20 MG tablet Commonly known as: Crestor Take 1 tablet (20 mg total) by mouth daily.        OBJECTIVE:   Vital Signs: BP (!) 154/84 (BP Location: Left Arm, Patient Position: Sitting, Cuff Size: Normal)   Pulse 86   Temp 98.3 F (36.8 C)   Ht 5\' 4"  (1.626 m)   Wt 126 lb 12.8 oz (57.5 kg)   SpO2 98%   BMI 21.77 kg/m   Wt Readings from Last 3 Encounters:  05/07/20 126 lb 12.8 oz (57.5 kg)  04/17/20 127 lb 3.2 oz (57.7 kg)  02/14/20 127 lb (57.6 kg)     Exam: General: Pt appears well and is in NAD  Lungs: Clear with good BS bilat with no rales, rhonchi, or wheezes  Heart: RRR with normal S1 and S2 and no gallops; no murmurs; no rub  Abdomen: Normoactive bowel sounds, soft, nontender, without masses or organomegaly palpable  Extremities: No pretibial edema. No tremor.  Skin: Normal texture and temperature to palpation.   Neuro: MS is good with appropriate affect, pt is alert and Ox3          DATA REVIEWED:  Lab Results  Component Value Date   HGBA1C 7.3 (A) 05/07/2020   HGBA1C 7.2 (H) 01/11/2020   HGBA1C 7.3 (H) 09/24/2019   Lab Results  Component Value Date   LDLCALC 55 04/17/2020   CREATININE 3.17 (H) 04/17/2020    Lab Results  Component Value Date   CHOL 118 04/17/2020   HDL 47 04/17/2020   LDLCALC 55 04/17/2020   TRIG 81 04/17/2020   CHOLHDL 2.5 04/17/2020         ASSESSMENT / PLAN / RECOMMENDATIONS:    1) Type 1 Diabetes Mellitus, Poorly controlled, With retinopathic and CKD IV  complications - Most recent A1c of 7.2 %. Goal A1c < 7.0 %.     - A1c skewed due to CKD, his actual  A1c based on glucose meter is ~ 8.5%  - Pt has been noted with hypoglycemia this is mainly during he day, it seems that he sometimes makes his own estimation on prandial insulin which ends up being too much. Pt also noted with over-correction of hypoglycemia.  - I have strongly encouraged him to use the provided prandial doses, correction scale etc - I am also going to provide him with an insulin to carb ratio for snacks only.     Plan:  MEDICATIONS: - Increase  lantus to 18 units daily - Novolog 5 units with each meal - Snack : Novolog: Insulin to carb ratio of 1:15  - CF: Novolog ( BG- 130/55)   EDUCATION / INSTRUCTIONS:  BG monitoring instructions: Patient is instructed to check his blood sugars 4 times a day, before meals and bedtime .  Call [facility] if: BG persistently < 70 or > 300. . I reviewed the Rule of 15 for the treatment of hypoglycemia in detail with the patient. Literature supplied.   2) Diabetic complications:   Eye: Does have known diabetic retinopathy.   Neuro/ Feet: Does not  have known diabetic peripheral neuropathy .   Renal: Patient does  have known baseline CKD. He   is not  on an ACEI/ARB at present.     F/U in 4 months    Signed electronically by: Mack Guise, MD  [facility]  [address] [facility] [address] [place], [place] [address] Phone: [phone] FAX: [phone]   CC: Elsie Stain, MD [address] [place] [address] Phone: [phone]  Fax: [phone]  Return to Endocrinology clinic as below: Future Appointments  Date Time Provider [place]  06/20/2020  9:45 AM Frann Rider, NP [facility] None  07/17/2020 10:30 AM Elsie Stain, MD [id number] None  09/07/2020  2:00 PM Shamleffer, Melanie Crazier, MD [id number] None

## 2020-05-08 DIAGNOSIS — N184 Chronic kidney disease, stage 4 (severe): Secondary | ICD-10-CM | POA: Diagnosis not present

## 2020-05-08 MED ORDER — NOVOLOG FLEXPEN 100 UNIT/ML ~~LOC~~ SOPN: 5.0000 [IU] | PEN_INJECTOR | Freq: Three times a day (TID) | SUBCUTANEOUS | 3 refills | Status: DC

## 2020-05-08 NOTE — Telephone Encounter (Signed)
Rx sent 

## 2020-05-16 DIAGNOSIS — N2581 Secondary hyperparathyroidism of renal origin: Secondary | ICD-10-CM | POA: Diagnosis not present

## 2020-05-16 DIAGNOSIS — N184 Chronic kidney disease, stage 4 (severe): Secondary | ICD-10-CM | POA: Diagnosis not present

## 2020-05-16 DIAGNOSIS — I129 Hypertensive chronic kidney disease with stage 1 through stage 4 chronic kidney disease, or unspecified chronic kidney disease: Secondary | ICD-10-CM | POA: Diagnosis not present

## 2020-05-16 DIAGNOSIS — E1022 Type 1 diabetes mellitus with diabetic chronic kidney disease: Secondary | ICD-10-CM | POA: Diagnosis not present

## 2020-05-16 DIAGNOSIS — D631 Anemia in chronic kidney disease: Secondary | ICD-10-CM | POA: Diagnosis not present

## 2020-05-16 DIAGNOSIS — R809 Proteinuria, unspecified: Secondary | ICD-10-CM | POA: Diagnosis not present

## 2020-05-24 ENCOUNTER — Ambulatory Visit: Payer: Medicaid Other | Admitting: Adult Health

## 2020-05-30 DIAGNOSIS — E103513 Type 1 diabetes mellitus with proliferative diabetic retinopathy with macular edema, bilateral: Secondary | ICD-10-CM | POA: Diagnosis not present

## 2020-05-30 DIAGNOSIS — H43393 Other vitreous opacities, bilateral: Secondary | ICD-10-CM | POA: Diagnosis not present

## 2020-06-20 ENCOUNTER — Ambulatory Visit: Payer: Medicaid Other | Admitting: Adult Health

## 2020-07-12 ENCOUNTER — Encounter: Payer: Self-pay | Admitting: Adult Health

## 2020-07-12 ENCOUNTER — Other Ambulatory Visit: Payer: Self-pay

## 2020-07-12 ENCOUNTER — Ambulatory Visit: Payer: Medicaid Other | Admitting: Adult Health

## 2020-07-12 VITALS — BP 149/85 | HR 80 | Ht 63.0 in | Wt 121.0 lb

## 2020-07-12 DIAGNOSIS — I151 Hypertension secondary to other renal disorders: Secondary | ICD-10-CM | POA: Diagnosis not present

## 2020-07-12 DIAGNOSIS — N2889 Other specified disorders of kidney and ureter: Secondary | ICD-10-CM

## 2020-07-12 DIAGNOSIS — E785 Hyperlipidemia, unspecified: Secondary | ICD-10-CM | POA: Diagnosis not present

## 2020-07-12 DIAGNOSIS — Z720 Tobacco use: Secondary | ICD-10-CM

## 2020-07-12 DIAGNOSIS — N1831 Chronic kidney disease, stage 3a: Secondary | ICD-10-CM

## 2020-07-12 DIAGNOSIS — I639 Cerebral infarction, unspecified: Secondary | ICD-10-CM | POA: Diagnosis not present

## 2020-07-12 DIAGNOSIS — E1021 Type 1 diabetes mellitus with diabetic nephropathy: Secondary | ICD-10-CM

## 2020-07-12 NOTE — Progress Notes (Signed)
[facility] [address] [place]. [place] [address] [phone]       STROKE FOLLOW UP NOTE  George [name] Date of Birth:  November 08, 1986 Medical Record Number:  [id number]   Reason for Referral: Reoccurring strokes     CHIEF COMPLAINT:  Chief Complaint  Patient presents with  . Follow-up    rm 9 here for a stoke f/u.    HPI:  Today, 07/12/2020, George [name] returns for stroke follow-up accompanied by his mother.  Residual deficits of right periphery deficit which has been stable.  Denies new stroke/TIA symptoms.  Prior complaints of blurred vision currently being followed by ophthalmology with evidence of diabetic retinopathy currently receiving injections with improvement. Continues on clopidogrel and Crestor 20 mg daily without side effects. Recent lipid panel showed LDL 55.  Blood pressure today 149/85.  Glucose levels stable with recent A1c 7.3 and continues to follow with endocrinology.  Tobacco use continues but greatly decreased - in process of completely quitting.  No concerns at this time.   History provided for reference purposes only Update 01/11/2020 JM: George [name] is a 34 y.o. male with history of stroke and 07/18/2019 and 09/18/2019, DKA, type I DB, HTN, CKD stage III presenting with hematemesis and altered mental status on 01/12/2020.  Evaluated by stroke team and Dr. Erlinda Hong with stroke work-up revealing recurrent small right cerebellar and punctate right frontal cortical infarcts as evidenced on MRI with prior history of embolic stroke and multiple risk factors.  MRA head/neck largely unremarkable and 2D echo normal EF without cardiac source of embolus identified.  Recommended DAPT for 3 weeks then Plavix alone.  Prior 2-week cardiac monitor completed 10/2019 negative for atrial fibrillation.  Prior history of stroke 05/2019 left PCA 2/2 large vessel disease versus cardioembolic source and 07/1855 multiple bilateral infarcts likely 2/2 synchronized  small vessel disease.  Recurrent DKA and uncontrolled DM placed on insulin drip with A1c 7.2.  Advised to follow-up outpatient with endocrinology with consideration of insulin pump.  Upon admission, hypertensive urgency with BP 184/115 and recommended long-term BP goal normotensive range.  LDL 68 and recommended continuation of Crestor 20 mg daily.  Current tobacco use with smoking cessation counseling provided.  Other stroke risk factors include history of EtOH use and substance abuse with UDS THC positive.  Other active problems include acute encephalopathy, acute on CKD stage IV and GERD.  He was discharged home in stable condition without therapy needs.  George [name] is being seen today for hospital follow up accompanied by his mother.  He has been doing well since discharge without residual deficits.  He does continue to complain of visual impairment but has recently been referred to retina specialist due to concern for diabetic retinopathy.  Endorses improvement of glucose levels with recent average 170s which has greatly improved from prior with ongoing use of Lantus.  Recently established care with endocrinology.  Completed 3 weeks DAPT and continues on Plavix alone without bleeding or bruising.  Continues on Crestor 20 mg daily without myalgias.  After prior visit, discontinued atorvastatin and switch to Crestor due to possible statin myalgias which greatly improved.  Blood pressure today 145/89.  Does not routinely monitor blood pressure at home.  Ongoing tobacco use previously 1 pack daily and now 2 packs/week.  Ongoing THC use with patient reporting benefit due to morning nausea and history of gastroparesis.  No further concerns at this time.   Stroke admission 06/12/2019: [name] is a 34 y.o.  male with history of type I DB presented on 06/12/2019 with receptive aphasia and word salad responses, not oriented with HA.  Stroke work-up revealed large left PCA infarct embolic pattern  secondary to large vessel disease versus cardioembolic source.  Initial CT head showed large left PCA infarct.  MRI brain showed large left PCA infarct with petechial hemorrhage left occipital lobe HI 1.  Carotid Doppler and 2D echo unremarkable.  Lower extremity venous Dopplers negative for DVT.  TEE did not show evidence of PFO or cardiac source of embolus.  Recommended 30-day cardiac event monitor outpatient to assess for atrial fibrillation.  Hypercoagulable/vasculitis work-up negative.  LDL 102 and A1c 8.4.  Recommended DAPT for 3 months then aspirin alone due to intracranial stenosis.  No prior history of HTN with elevation during admission and started on amlodipine 10 mg and metoprolol 50 mg twice daily.  Also found to have CKD, stage III with referral to nephrology.  Initiated atorvastatin 40 mg daily.  Advised to follow-up with PCP for uncontrolled DM.  Initial finding of leukocytosis improved during admission with work-up unremarkable and likely reactive.  Other stroke risk factors include EtOH use, THC use and tobacco use.  He was discharged home in stable condition with residual aphasia.  Initial visit 07/26/2019: Residual deficits expressive aphasia and delayed recall.  He does endorse overall improvement with only occasional word finding difficulty.  He is currently in the process of obtaining insurance therefore therapies were not started.  Has returned to playing video games with improvement - was having difficulty initially  He continues to live at home with his mother but is able to perform all prior activities He does have complaints of swelling bilateral lower extremity swelling - started once he started his new prescriptions; he does have appointment next Monday with Nephrology for initial evaluation due to elevated creatinine and CKD stage III Continues on aspirin and Plavix without bleeding or bruising Continues on atorvastatin without myalgias -recent lipid panel by PCP LDL 71 Blood  pressure stable at 139/83 No further concerns at this time  Update 11/09/2019: George [name] is a 34 year old male who is being seen today for recent hospital discharge along with prior stroke follow-up accompanied by his mother.  He presented to the ED on 09/25/2019 with AMS, DKA and agitation.  Stroke work-up showed multiple bilateral infarcts embolic pattern as evidenced on MRI cryptogenic etiology.  MRI showed 5 mm acute infarct right parietal cortex over the convexity, small area of subacute infarct right cerebellum, chronic infarct left cerebellum and chronic hemorrhagic infarct left occipital lobe.  TEE during prior admission did not show evidence of PFO or cardiac source of embolus and recommended consideration of transthoracic agitated saline exam.  LDL 71.  A1c 7.3.  USD positive for THC.  Due to lack of insurance, he did admit to being noncompliant with medications.  Recommended DAPT for 3 weeks and Plavix alone.  Blood pressure elevated during admission and recommended long-term BP goal normotensive range.  Uncontrolled DM and needs follow-up with PCP.  Other stroke risk factors include current tobacco use, THC use, previous EtOH use, history of stroke, family history of stroke and prior substance abuse.  Discharged home in stable condition.  He has been stable since hospital discharge.  He does continue to have decreased vision and balance deficit which slightly worsened since recent stroke but has been improving.  He also endorses L>R lower extremity weakness/fatigue feeling along with muscle and joint aching.  He has continued on  aspirin and Plavix without bleeding or bruising.  Continues on atorvastatin with possible statin myalgias.  He was seen by cardiology on 09/30/2019 and recommended 14-day cardiac monitor to assess for episodes of A. fib or flutter which was unremarkable.  Continues to be followed by cardiology for HTN management.  Does not routinely monitor blood pressure at home with  today's reading 137/78.  At times he can feel lightheaded sensation/dizziness shortly after taking hydralazine dosage but resolves fairly quickly.  He does continue to use tobacco and THC with attempts of quitting.  Continues to live at home with his mother and currently in the process of Medicaid approval and disability.  No further concerns at this time.    ROS:   14 system review of systems performed and negative with exception of vision deficit  PMH:  Past Medical History:  Diagnosis Date  . AKI (acute kidney injury) ([place])   . Cerebral infarction ([facility])   . Hypertension   . Neurologic deficit due to acute ischemic cerebrovascular accident (CVA) ([place]) 06/13/2019  . Supraventricular tachycardia ([place])   . Type 1 diabetes ([facility])     PSH:  Past Surgical History:  Procedure Laterality Date  . BUBBLE STUDY  06/20/2019   Procedure: BUBBLE STUDY;  Surgeon: Fay Records, MD;  Location: [facility];  Service: Cardiovascular;;  . TEE WITHOUT CARDIOVERSION N/A 06/20/2019   Procedure: TRANSESOPHAGEAL ECHOCARDIOGRAM (TEE);  Surgeon: Fay Records, MD;  [facility] END[facility]COPY;  Service: Cardiovascular;  Laterality: N/A;    Social History:  Social History   Socioeconomic History  . Marital status: Single    Spouse name: Not on file  . Number of children: Not on file  . Years of education: Not on file  . Highest education level: Not on file  Occupational History  . Not on file  Tobacco Use  . Smoking status: Heavy Tobacco Smoker    Packs/day: 0.25    Types: Cigarettes    Start date: 12/29/2010  . Smokeless tobacco: Never Used  Vaping Use  . Vaping Use: Former  . Start date: 12/30/2015  . Quit date: 12/29/2017  Substance and Sexual Activity  . Alcohol use: Not on file    Comment: 6 beers/week  . Drug use: Not Currently  . Sexual activity: Not Currently  Other Topics Concern  . Not on file  Social History Narrative  . Not on file   Social Determinants of Health   Financial Resource  Strain:   . Difficulty of Paying Living Expenses:   Food Insecurity:   . Worried About Charity fundraiser in the Last Year:   . Arboriculturist in the Last Year:   Transportation Needs:   . Film/video editor (Medical):   Marland Kitchen Lack of Transportation (Non-Medical):   Physical Activity:   . Days of Exercise per Week:   . Minutes of Exercise per Session:   Stress:   . Feeling of Stress :   Social Connections:   . Frequency of Communication with Friends and Family:   . Frequency of Social Gatherings with Friends and Family:   . Attends Religious Services:   . Active Member of Clubs or Organizations:   . Attends Archivist Meetings:   Marland Kitchen Marital Status:   Intimate Partner Violence:   . Fear of Current or Ex-Partner:   . Emotionally Abused:   Marland Kitchen Physically Abused:   . Sexually Abused:     Family History:  Family History  Problem  Relation Age of Onset  . Hypertension Mother   . Diabetes Mother   . Hyperlipidemia Mother   . Diabetes Father     Medications:   Current Outpatient Medications on File Prior to Visit  Medication Sig Dispense Refill  . acetaminophen (TYLENOL) 325 MG tablet Take 650 mg by mouth every 6 (six) hours as needed for headache.    Marland Kitchen amLODipine (NORVASC) 10 MG tablet Take 1 tablet (10 mg total) by mouth daily. 90 tablet 2  . carvedilol (COREG) 25 MG tablet Take 1 tablet (25 mg total) by mouth 2 (two) times daily with a meal. 180 tablet 1  . clopidogrel (PLAVIX) 75 MG tablet Take 1 tablet (75 mg total) by mouth daily. 90 tablet 3  . glucose blood (ONETOUCH VERIO) test strip Use as instructed to check blood sugar 3 times daily. 100 each 11  . hydrALAZINE (APRESOLINE) 50 MG tablet Take 1 tablet (50 mg total) by mouth 3 (three) times daily. 270 tablet 1  . insulin aspart (NOVOLOG FLEXPEN) 100 UNIT/ML FlexPen Inject 5 Units into the skin 3 (three) times daily with meals. Mac daily 30 units to include correction scale and snack coverage 15 mL 3  . insulin  glargine (LANTUS SOLOSTAR) 100 UNIT/ML Solostar Pen Inject 18 Units into the skin daily. 15 mL 11  . Insulin Pen Needle (PEN NEEDLES 3/16") 31G X 5 MM MISC To inject insulin with novolog flex pen, 3 times daily with meals. 100 each 4  . pantoprazole (PROTONIX) 40 MG tablet Take 1 tablet (40 mg total) by mouth daily. For stomach protection 30 tablet 11  . rosuvastatin (CRESTOR) 20 MG tablet Take 1 tablet (20 mg total) by mouth daily. 30 tablet 4   No current facility-administered medications on file prior to visit.    Allergies:  No Known Allergies   Physical Exam  Vitals:   07/12/20 1050  BP: (!) 149/85  Pulse: 80  Weight: 121 lb (54.9 kg)  Height: _0  (1.6 m)   Body mass index is 21.43 kg/m. No exam data present  General: well developed, well nourished,  pleasant middle-age Caucasian male, seated, in no evident distress Head: head normocephalic and atraumatic.   Neck: supple with no carotid or supraclavicular bruits Cardiovascular: regular rate and rhythm, no murmurs Musculoskeletal: no deformity Skin:  no rash/petichiae Vascular:  Normal pulses all extremities   Neurologic Exam Mental Status: Awake and fully alert. Speech and language normal. Oriented to place and time. Recent and remote memory intact. Attention span, concentration and fund of knowledge appropriate. Mood and affect appropriate.  Cranial Nerves: Pupils equal, briskly reactive to light. Extraocular movements full without nystagmus. Visual fields with decreased acuity right periphery bilaterally.  Hearing intact. Facial sensation intact. Face, tongue, palate moves normally and symmetrically.  Motor: Normal bulk and tone. Normal strength in all tested extremity muscles. Sensory.: intact to touch , pinprick , position and vibratory sensation.  Coordination: Rapid alternating movements normal in all extremities. Finger-to-nose and heel-to-shin performed accurately bilaterally. Gait and Station: Arises from chair  without difficulty. Stance is normal. Gait demonstrates normal stride length and balance.  Able to tandem gait without difficulty. Reflexes: 1+ and symmetric. Toes downgoing.       ASSESSMENT: Razi Hickle is a 34 y.o. year old male here with multiple reoccurring strokes including large left PCA infarct embolic pattern on 8/84/1660 secondary to large vessel disease source versus cardioembolic with negative cardiac monitor, 09/25/2019 with finding of multiple bilateral infarcts cryptogenic etiology  and recently on 01/12/2020 acute early subacute ischemic infarct involving peripheral right cerebellum and subacute infarcts involving right frontal cortex likely secondary to uncontrolled risk factors. Vascular risk factors include uncontrolled DM type 1 with reoccurring DKA and diabetic retinopathy, HTN, HLD, CKD stage III, EtOH use, THC use and tobacco use.      PLAN:  1. Multiple recurrent strokes:  -Residual deficits: Right-sided visual impairment stable  -continue clopidogrel 75 mg daily and Crestor 20 mg daily for secondary stroke prevention.  -Ongoing follow-up with PCP for aggressive stroke risk factor management 2. HTN: BP goal <130/90.  Stable today.  Continue to follow with PCP for monitoring and management  3. HLD: LDL goal <70.  Recent LDL 55.  Continue Crestor 20 mg daily and ongoing prescribing, monitoring and management by PCP 4. DMI: A1c goal <7.0.  Recent A1c 7.3(improvement).  Continue to follow with endocrinology for monitoring and management 5. Diabetic retinopathy: Continue to follow closely with ophthalmology for monitoring and management 6. Tobacco use: Currently in process of complete cessation but drastically decreased from prior 1 pack daily to possibly 1 pack/week if not less.  His end goal is complete cessation   Follow up in 6 months or call earlier if needed   I spent 25 minutes of face-to-face and non-face-to-face time with patient and mother.  This included  previsit chart review, lab review, study review, order entry, electronic health record documentation, patient education regarding multiple recurrent strokes, importance of managing stroke risk factors, diabetic retinopathy, importance of tobacco cessation and answered all questions to patient and mother satisfaction   Frann Rider, AGNP-BC  [facility] [address] [place], [place] [address]  Phone [phone] Fax [phone] Note: This document was prepared with digital dictation and possible smart phrase technology. Any transcriptional errors that result from this process are unintentional.

## 2020-07-12 NOTE — Progress Notes (Signed)
I agree with the above plan 

## 2020-07-12 NOTE — Patient Instructions (Signed)
Continue clopidogrel 75 mg daily  and Crestor 20 mg daily for secondary stroke prevention  Continue to follow up with PCP regarding cholesterol, blood pressure and diabetes management   Continue to follow with ophthalmology as scheduled  Maintain strict control of hypertension with blood pressure goal below 130/90, diabetes with hemoglobin A1c goal below 6.5% and cholesterol with LDL cholesterol (bad cholesterol) goal below 70 mg/dL. I also advised the patient to eat a healthy diet with plenty of whole grains, cereals, fruits and vegetables, exercise regularly and maintain ideal body weight.  Followup in the future with me in 6 months or call earlier if needed       Thank you for coming to see [place] at [facility]. I hope we have been able to provide you high quality care today.  You may receive a patient satisfaction survey over the next few weeks. We would appreciate your feedback and comments so that we may continue to improve ourselves and the health of our patients.

## 2020-07-17 ENCOUNTER — Ambulatory Visit: Payer: Medicaid Other | Admitting: Critical Care Medicine

## 2020-07-20 IMAGING — MR MR HEAD W/O CM
8 of 10 series · 37 of 48 positions shown · non-contrast
Comparison: CT head 09/23/2019.  MRI head 06/14/2019

CLINICAL DATA: Rule out acute infarct.

EXAM:
MRI HEAD WITHOUT CONTRAST
TECHNIQUE: Multiplanar, multiecho pulse sequences of the brain and surrounding
structures were obtained without intravenous contrast.

[Series 3: DWI · axial · 3.0mm · 1.09mm/px · z∈[-81,+88]mm · 11 of 116 slices shown (1 of 4)]
[im 1/116]
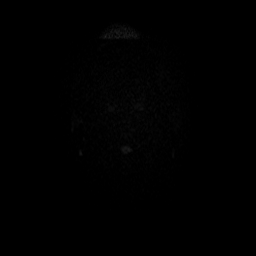
[im 12/116]
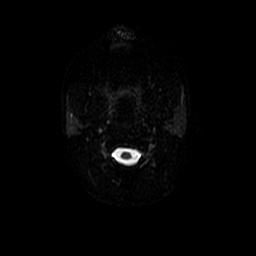
[im 24/116]
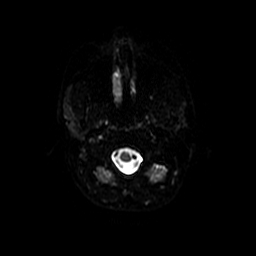
[im 35/116]
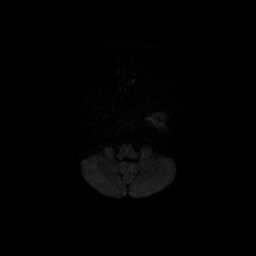
[im 47/116]
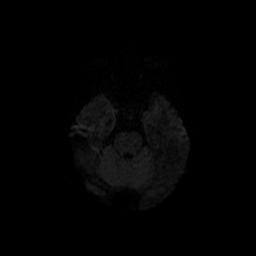
[im 58/116]
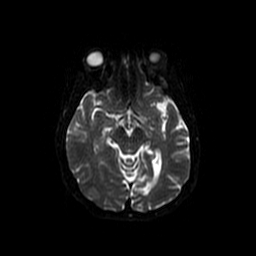
[im 70/116]
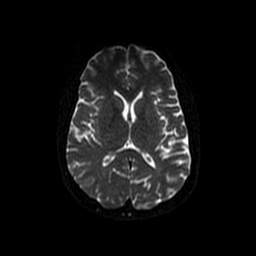
[im 81/116]
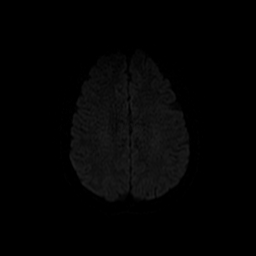
[im 93/116]
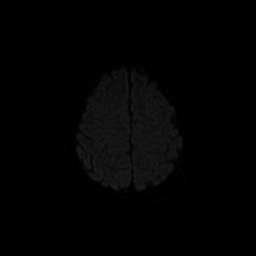
[im 104/116]
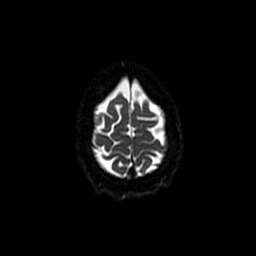
[im 116/116]
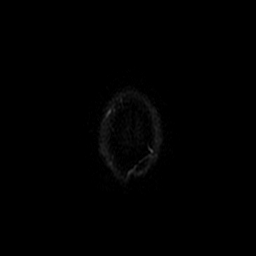

[Series 4: DWI · coronal · 5.0mm · 1.09mm/px · 7 of 76 slices shown (2 of 4)]
[im 1/76]
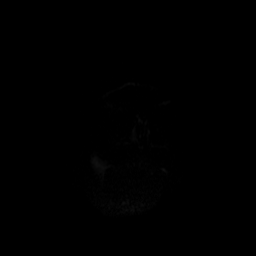
[im 13/76]
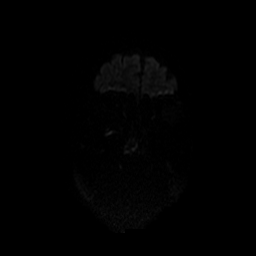
[im 26/76]
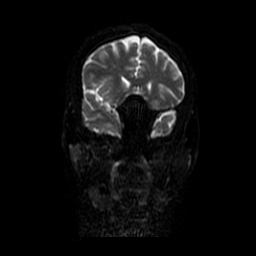
[im 38/76]
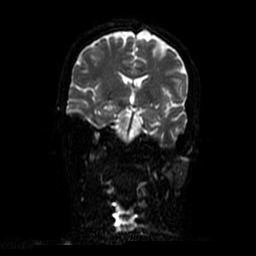
[im 51/76]
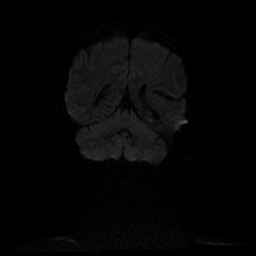
[im 63/76]
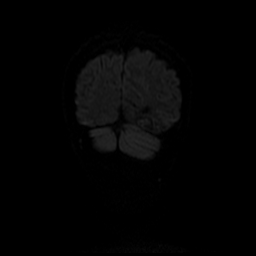
[im 76/76]
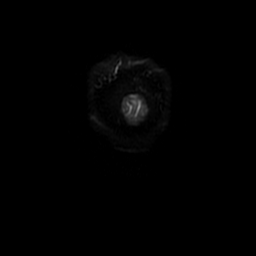

[Series 5: FLAIR · axial · 3.0mm · 0.43mm/px · z∈[-67,+100]mm · 3 of 29 slices shown]
[im 1/29]
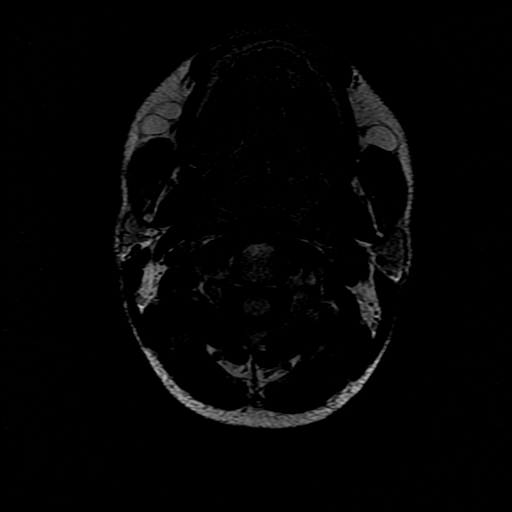
[im 15/29]
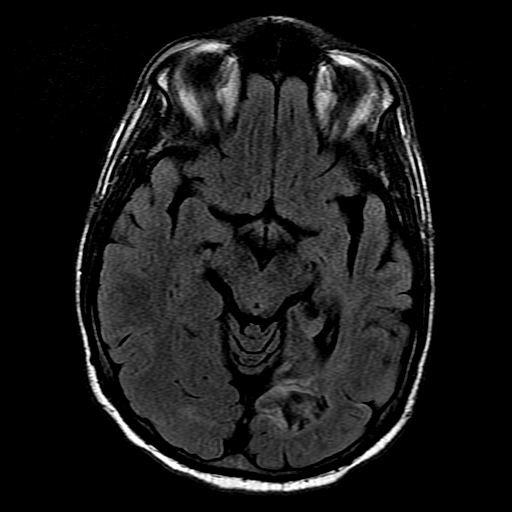
[im 29/29]
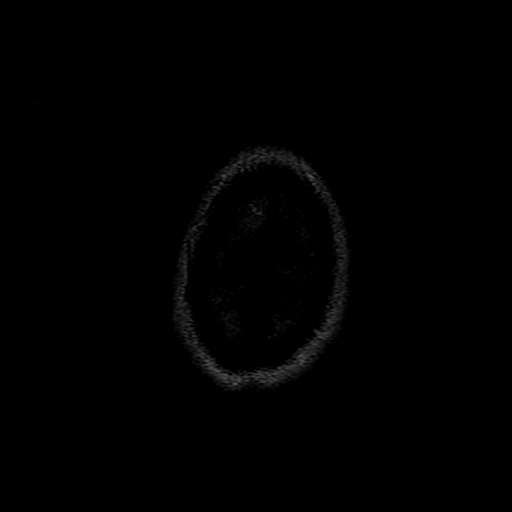

[Series 6: T1 · axial · 3.0mm · 0.47mm/px · z∈[-68,-12]mm · 3 of 116 slices shown]
[im 1/116]
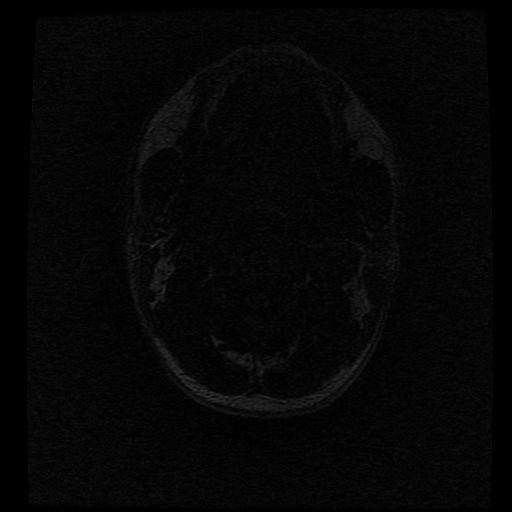
[im 13/116]
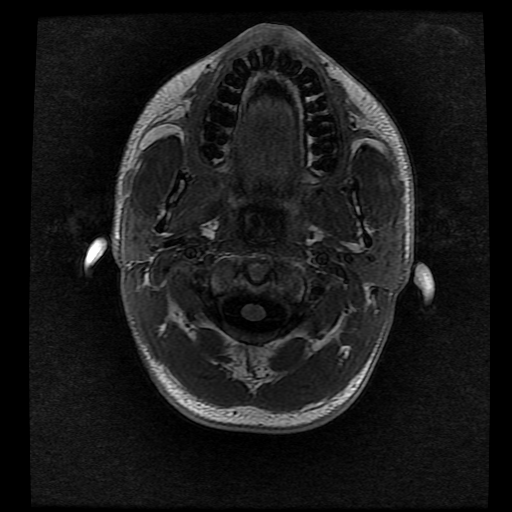
[im 39/116]
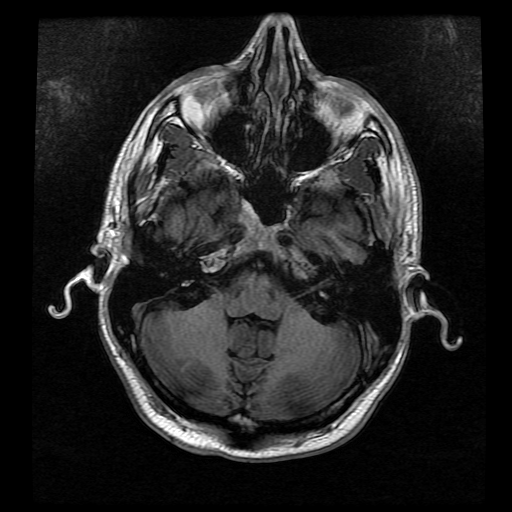

[Series 8: T2 · axial · 5.0mm · 0.43mm/px · z∈[-68,+99]mm · 3 of 29 slices shown (1 of 2)]
[im 1/29]
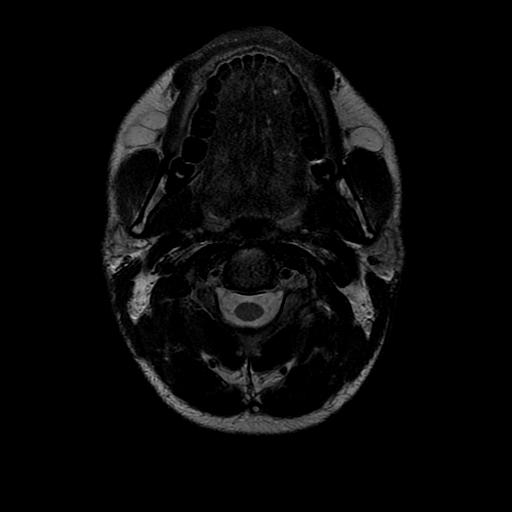
[im 15/29]
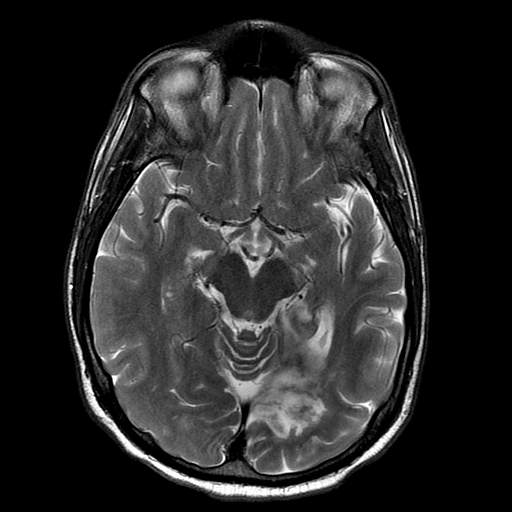
[im 29/29]
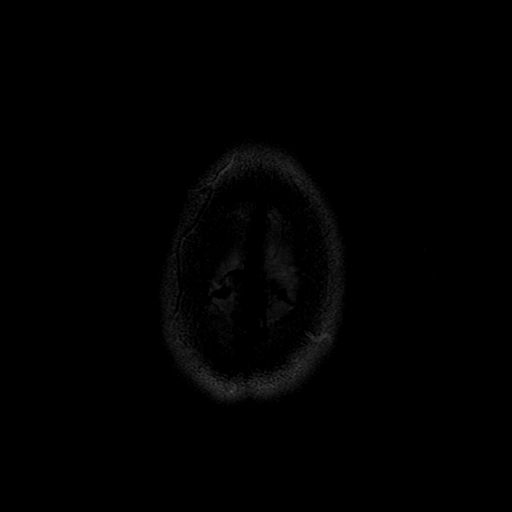

[Series 10: T2 · coronal · 5.0mm · 0.39mm/px · 2 of 28 slices shown (2 of 2)]
[im 1/28]
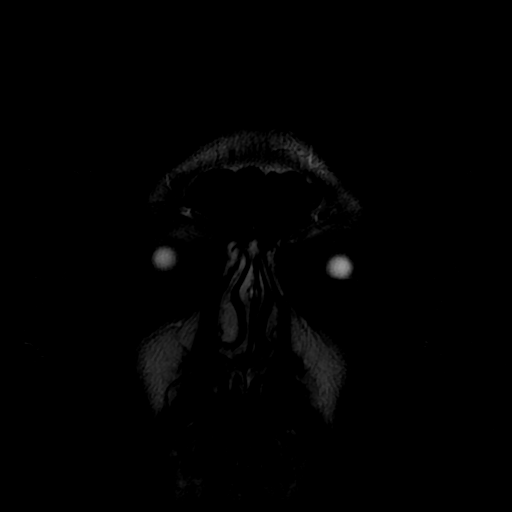
[im 28/28]
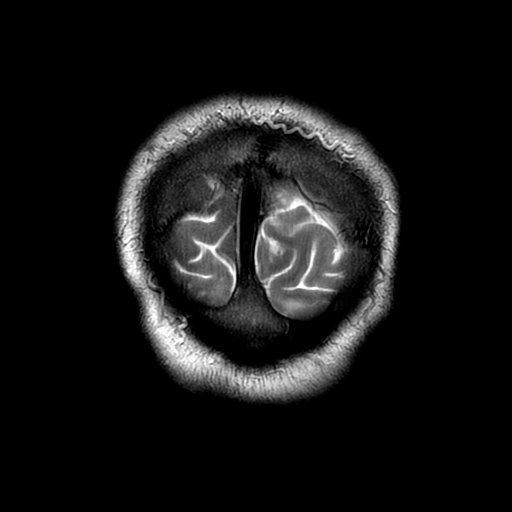

[Series 300: DWI · axial · 3.0mm · 1.09mm/px · z∈[-81,+88]mm · 5 of 58 slices shown (3 of 4)]
[im 1/58]
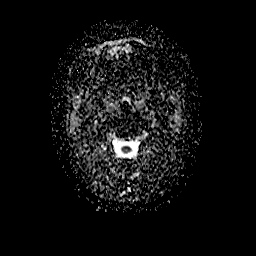
[im 15/58]
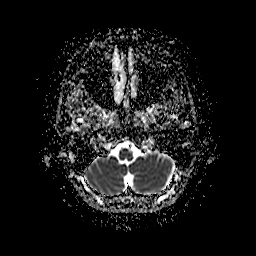
[im 29/58]
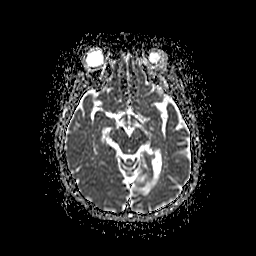
[im 43/58]
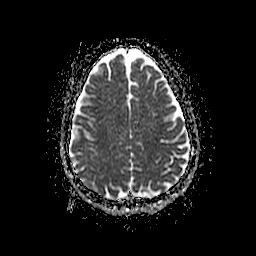
[im 58/58]
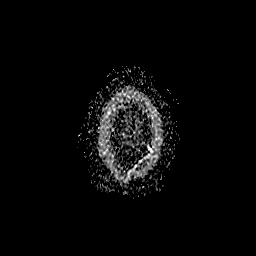

[Series 400: DWI · coronal · 5.0mm · 1.09mm/px · 3 of 38 slices shown (4 of 4)]
[im 1/38]
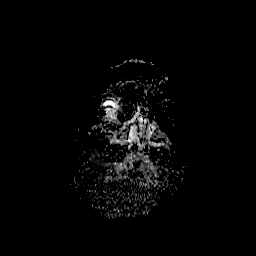
[im 19/38]
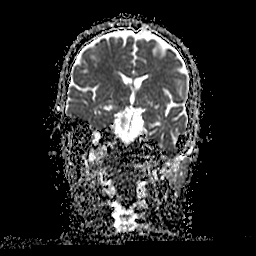
[im 38/38]
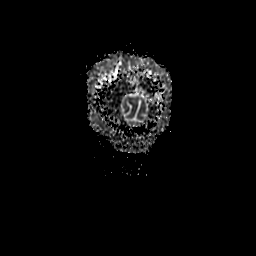

[37 of 48 positions shown; findings below may reference images not displayed]

FINDINGS: Brain: Small focus of restricted diffusion in the right parietal
cortex over the convexity compatible with acute infarct. 5 mm.

Facilitated diffusion right cerebellar hemisphere. Associated T2 and
FLAIR hyperintensity. Probable subacute, recent infarction. This was
not present on the prior MRI. Chronic infarct left cerebellum, not
present on the prior MRI.

Chronic left PCA infarct involving the left occipital lobe. This
shows evidence of chronic mild hemorrhage.

Ventricle size normal.

Vascular: Normal arterial flow voids.

Skull and upper cervical spine: Negative

Sinuses/Orbits: Negative

Other: None
IMPRESSION: 1. 5 mm acute infarct right parietal cortex over the convexity
2. Small area of subacute infarct right cerebellum. Chronic infarct
left cerebellum
3. Chronic hemorrhagic infarct left occipital lobe.

## 2020-07-21 IMAGING — US US ABDOMEN LIMITED
1 series · 14 of 25 positions shown · non-contrast
Comparison: CT 09/23/2019

CLINICAL DATA: Nausea and vomiting for 5 days.

EXAM:
ULTRASOUND ABDOMEN LIMITED RIGHT UPPER QUADRANT

[Series 1: us abdomen limited · 14 of 58 slices shown]
[im 1/58]
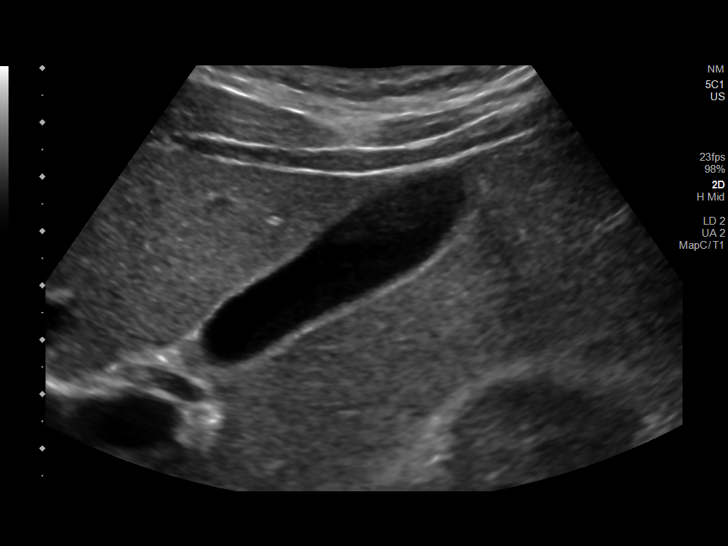
[im 5/58]
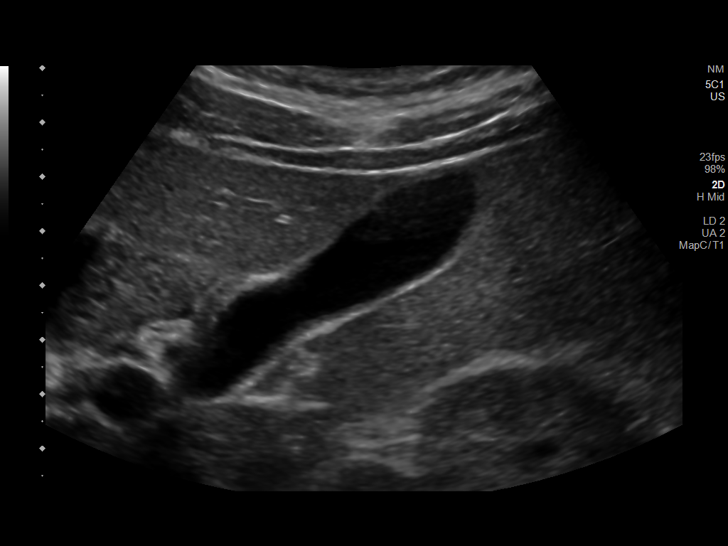
[im 10/58]
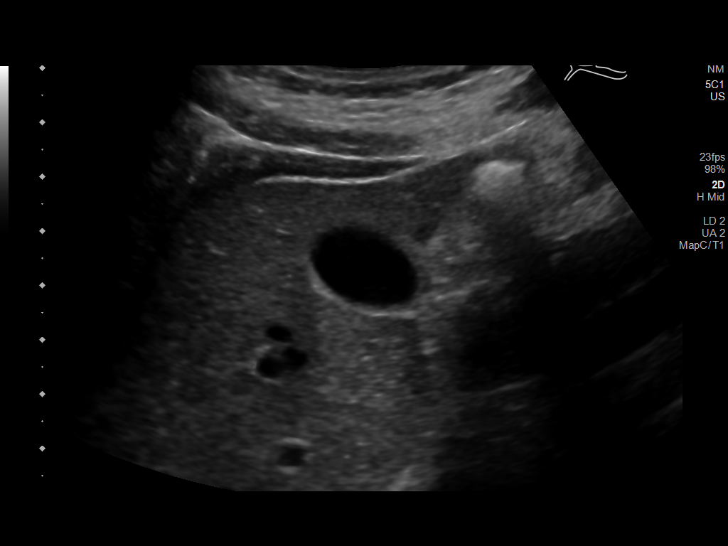
[im 15/58]
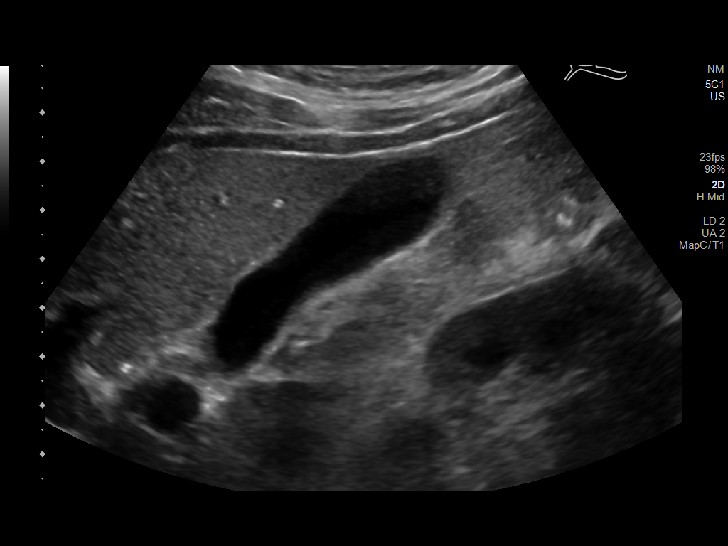
[im 20/58]
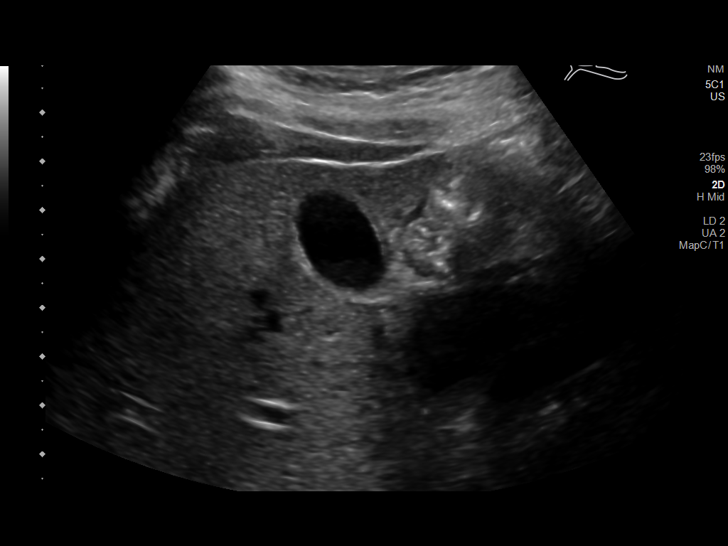
[im 22/58]
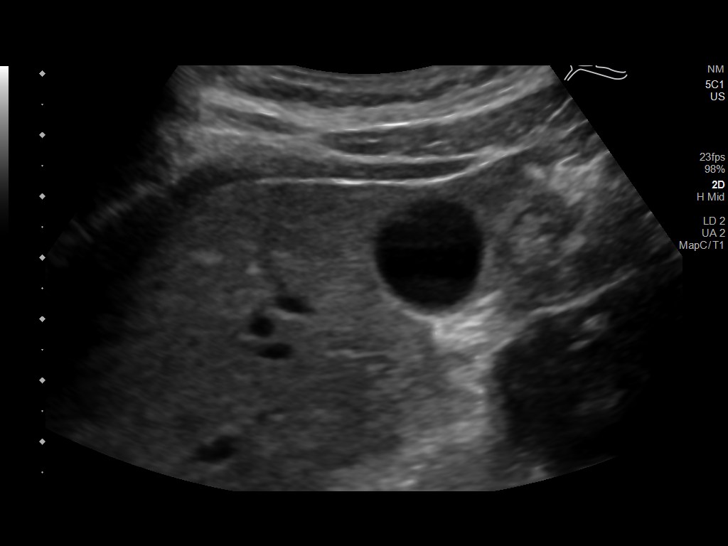
[im 27/58]
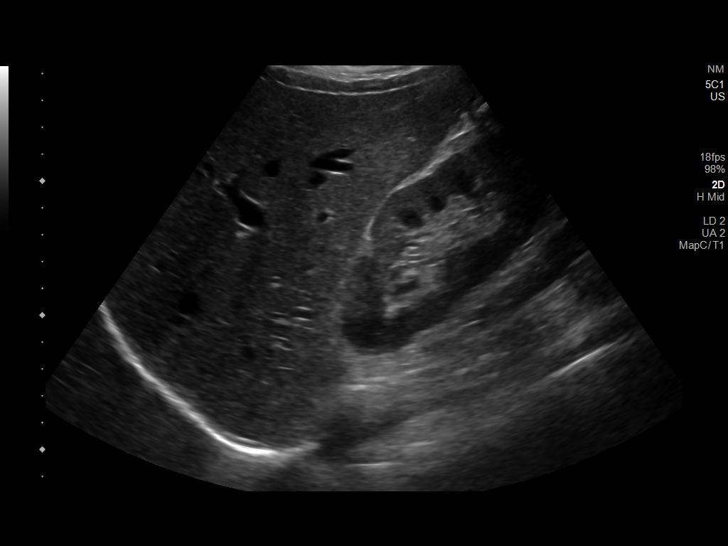
[im 31/58]
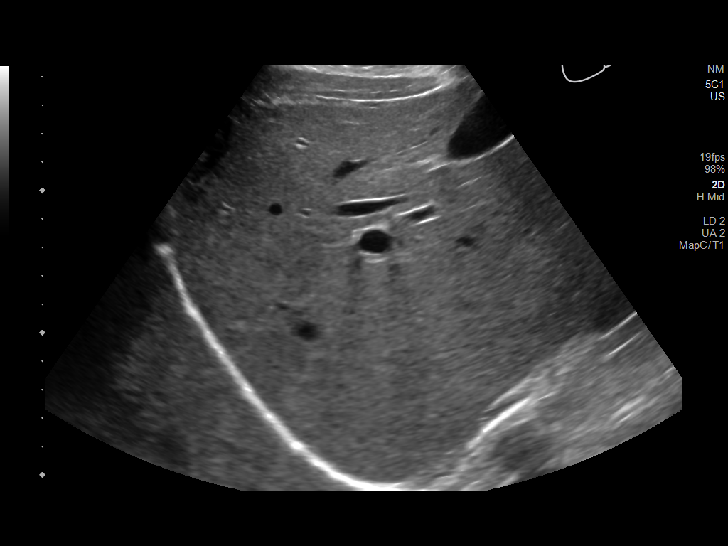
[im 36/58]
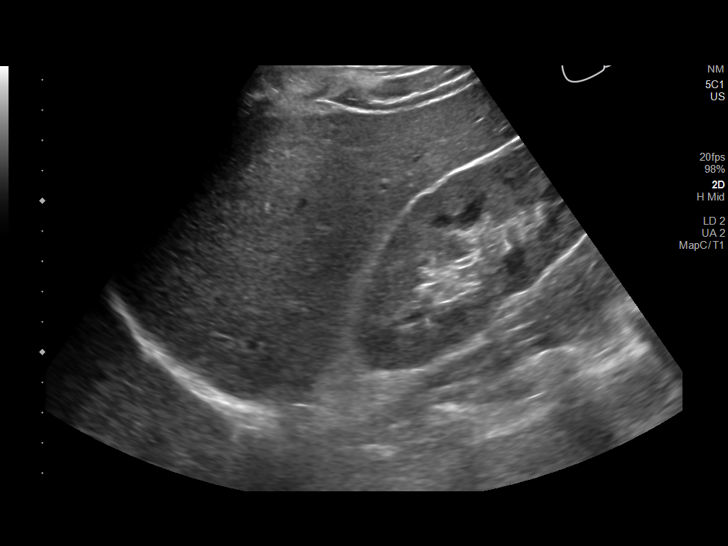
[im 39/58]
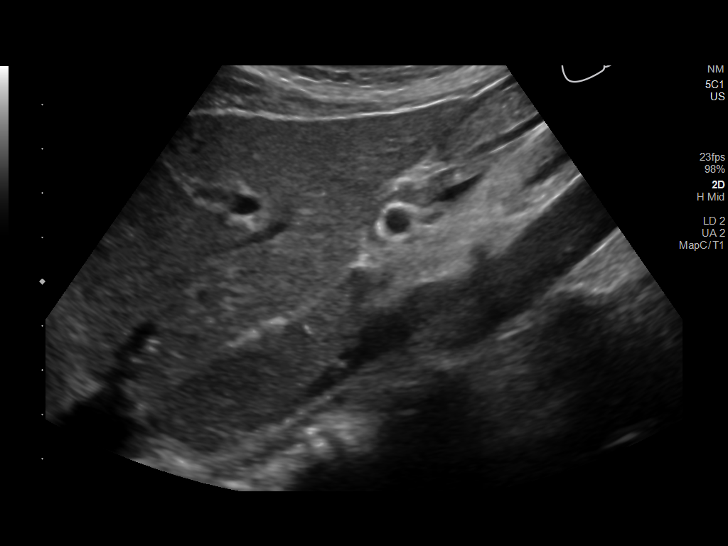
[im 43/58]
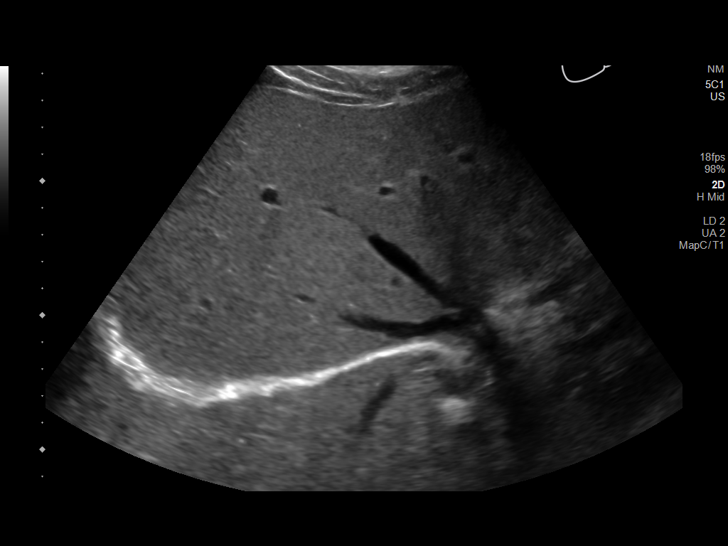
[im 48/58]
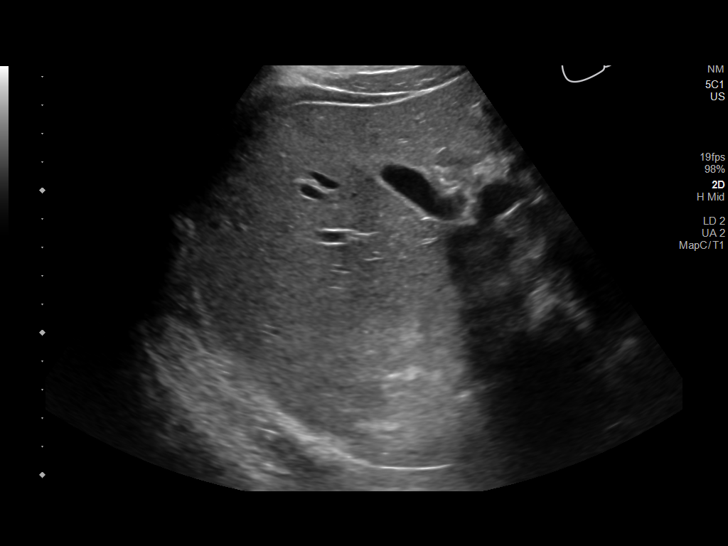
[im 53/58]
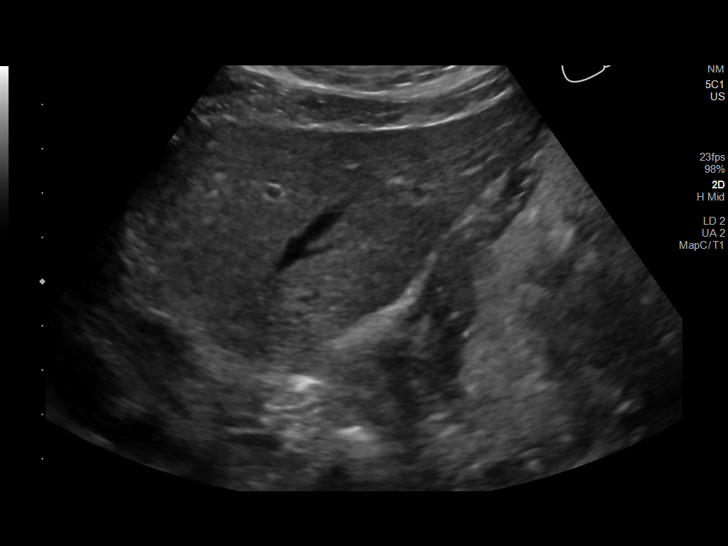
[im 58/58]
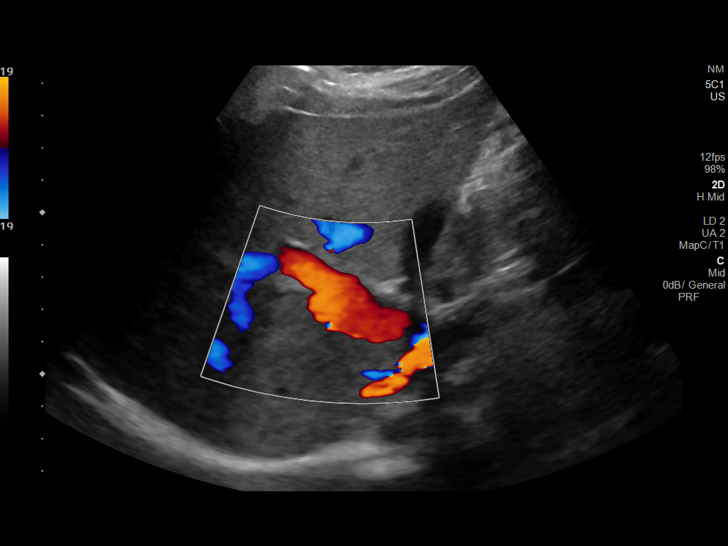

[14 of 25 positions shown; findings below may reference images not displayed]

FINDINGS: Gallbladder:

Physiologically distended. No gallstones or wall thickening
visualized. No sonographic Murphy sign noted by sonographer.

Common bile duct:

Diameter: 2 mm, normal.

Liver:

No focal lesion identified. Within normal limits in parenchymal
echogenicity. Portal vein is patent on color Doppler imaging with
normal direction of blood flow towards the liver.

Other: None.
IMPRESSION: Normal right upper quadrant ultrasound.

## 2020-07-25 ENCOUNTER — Encounter: Payer: Self-pay | Admitting: Critical Care Medicine

## 2020-07-25 ENCOUNTER — Ambulatory Visit: Payer: Medicaid Other | Attending: Critical Care Medicine | Admitting: Critical Care Medicine

## 2020-07-25 VITALS — BP 140/80 | HR 85 | Temp 97.3°F | Resp 17 | Wt 128.0 lb

## 2020-07-25 DIAGNOSIS — E1065 Type 1 diabetes mellitus with hyperglycemia: Secondary | ICD-10-CM

## 2020-07-25 DIAGNOSIS — N183 Chronic kidney disease, stage 3 unspecified: Secondary | ICD-10-CM

## 2020-07-25 DIAGNOSIS — E785 Hyperlipidemia, unspecified: Secondary | ICD-10-CM

## 2020-07-25 DIAGNOSIS — Z1159 Encounter for screening for other viral diseases: Secondary | ICD-10-CM | POA: Diagnosis not present

## 2020-07-25 DIAGNOSIS — E1069 Type 1 diabetes mellitus with other specified complication: Secondary | ICD-10-CM | POA: Diagnosis not present

## 2020-07-25 DIAGNOSIS — E103599 Type 1 diabetes mellitus with proliferative diabetic retinopathy without macular edema, unspecified eye: Secondary | ICD-10-CM

## 2020-07-25 DIAGNOSIS — I10 Essential (primary) hypertension: Secondary | ICD-10-CM | POA: Diagnosis not present

## 2020-07-25 DIAGNOSIS — N184 Chronic kidney disease, stage 4 (severe): Secondary | ICD-10-CM

## 2020-07-25 DIAGNOSIS — F17200 Nicotine dependence, unspecified, uncomplicated: Secondary | ICD-10-CM

## 2020-07-25 DIAGNOSIS — I69320 Aphasia following cerebral infarction: Secondary | ICD-10-CM

## 2020-07-25 DIAGNOSIS — Z7982 Long term (current) use of aspirin: Secondary | ICD-10-CM

## 2020-07-25 DIAGNOSIS — E1022 Type 1 diabetes mellitus with diabetic chronic kidney disease: Secondary | ICD-10-CM | POA: Diagnosis not present

## 2020-07-25 MED ORDER — CARVEDILOL 25 MG PO TABS: 25.0000 mg | ORAL_TABLET | Freq: Two times a day (BID) | ORAL | 1 refills | Status: DC

## 2020-07-25 MED ORDER — ONETOUCH VERIO VI STRP: ORAL_STRIP | 11 refills | Status: DC

## 2020-07-25 MED ORDER — NOVOLOG FLEXPEN 100 UNIT/ML ~~LOC~~ SOPN: 5.0000 [IU] | PEN_INJECTOR | Freq: Three times a day (TID) | SUBCUTANEOUS | 3 refills | Status: DC

## 2020-07-25 MED ORDER — AMLODIPINE BESYLATE 10 MG PO TABS: 10.0000 mg | ORAL_TABLET | Freq: Every day | ORAL | 2 refills | Status: DC

## 2020-07-25 MED ORDER — PANTOPRAZOLE SODIUM 40 MG PO TBEC: 40.0000 mg | DELAYED_RELEASE_TABLET | Freq: Every day | ORAL | 11 refills | Status: DC

## 2020-07-25 MED ORDER — HYDRALAZINE HCL 100 MG PO TABS: 100.0000 mg | ORAL_TABLET | Freq: Three times a day (TID) | ORAL | 4 refills | Status: DC

## 2020-07-25 MED ORDER — CLOPIDOGREL BISULFATE 75 MG PO TABS: 75.0000 mg | ORAL_TABLET | Freq: Every day | ORAL | 3 refills | Status: DC

## 2020-07-25 MED ORDER — ROSUVASTATIN CALCIUM 20 MG PO TABS: 20.0000 mg | ORAL_TABLET | Freq: Every day | ORAL | 4 refills | Status: DC

## 2020-07-25 MED ORDER — "PEN NEEDLES 3/16"" 31G X 5 MM MISC": 4 refills | Status: DC

## 2020-07-25 NOTE — Progress Notes (Signed)
Subjective:    Patient ID: George [name], male    DOB: [birth year], 34 y.o.   MRN: [id number] History of Present Illness: This is a 34 year old male has had previous history of cerebral infarction, type 1 diabetes, hypertension, tobacco use, supraventricular tachycardia, chronic kidney disease.  Since the last visit the patient has improved somewhat.  Labs did come back showing normal thyroid function and cholesterol at goal.  The patient's blood sugars have been reasonably well-controlled running fasting levels in the 1 20-1 60 range.  Note today the CBG is 180 blood pressure has come down but still not yet at goal today 160/112  The patient has yet to see speech pathology for his aphasia.  The barrier there was the patient did not have insurance and I indicated to the scheduler the patient can be self-pay  The patient has been able to reduce tobacco use down to 3 to 5 cigarettes daily and has yet to pick up the nicotine lozenges   09/19/2019 This is a follow-up visit for this 34 year old male with type 1 diabetes, hypertension, tobacco use, chronic kidney disease, and previous history of cerebral infarction.  The patient states apart from some fatigue he is doing fairly well.  He is now smoking 2 packs of cigarettes a week.  His blood pressure at home ranges been 130-135/80-85 range.  His blood sugars have been in the 1 20-1 50s with average is 1 30-1 40 on his meter.  His sugar will go down as low as 60s before dinner.  The patient does have history of receptive a aphasia and notes when he is playing his video games he will remember some names but not all the names of the characters in the game.  He has difficulty with memory retrieval.  He saw speech pathology 1 time and has not yet made a follow-up visit.    Patient did see nephrology and has a follow-up visit pending.  At the last visit the patient not receive any labs.  The patient denies any chest pain or shortness of  breath.  12/21: Since the last visit the patient was hospitalized 4 days after our visit in September.  The patient had diabetic ketoacidosis and also new areas of stroke.  Below is the discharge summary from that hospitalization  [name] a 33 y.o.malewith medical history significant oftype 1 diabetes, hypertension, recent CVA in June with combined receptive and expressive aphasia, CKD stage III who presented here with altered mental status and reportedly has been having nausea and persistent vomiting since Wednesday. Reportedly from EDP patient was initially able to give some history but acutely became more agitated and was pulling out his IV. He was tachycardic up to 160s and had improvement of heart rate down to 120s with 10 mg of verapamil. He was also tachypneic but remained on room air. Patient was restless and agitated at the time my evaluation and per nursing report he had attempted to use his foot to remove his hand restraints. He repeatedly stated "please help me," but was unable to explain any of his symptoms to me. He was pan positive and replied yes to every review of systems questions. Also stating he has pain everywhere. When asked for his full name and where he was he only stated "[place]."  He was afebrile and hypertensive up to 170s/108. In sinus tachycardia up to 160 and mildly tachypneic on room air. CBC showed leukocytosis of 29.6 and no anemia. CMP showed glucose level 532 with  bicarb of 18 and anion gap of 24. pH was mildly alkalotic at 7.59. Lactic acid of 5 and then 3.2 following IV fluids. TSH normal. COVID testing negative. CT head shows 2 or 3 rounded oval regions of low attenuation in the cerebral hemisphere not seen on previous studies. Suggest could be small interval lacunar infarcts with the possibility of embolic disease. Findings were age-indeterminate. CT abdomen showed no acute findings. There is gallbladder sludge or stones but no findings  of acute cholecystitis.  He was seen for nausea, vomiting, and altered mental status.  He was found to be in DKA.  He improved with insulin gtt and was transitioned to his home regimen.  He had head imaging and was found to have new strokes.  He was seen by neurology who recommended DAPT x 21 days then plavix alone.  Hospitalization also c/b AKI on CKD improving at time of discharge.  Hospital Course:  Diabetic ketoacidosis  T1DM Hypoglycemia - Unclear precipitating cause.  - He's had borderline temps, tmax of 100.2 since admission, but clinically appears to be improving and UA and CXR not suggestive of infection - follow blood cx (gram positive rods in 1/2 blood cx - repeat cx from 9/27 ngtd)  and follow urine cx (urine did have WBC, but negative LE/nitrites - no growth) - hold off on abx for now - DKA now resolved, AG 13, bicarb 22, BG173 - resumehome insulin at reduced dose 8units NPH with 3 units at meals and SSI(he's had some hypoglycemia here and he notes some at home as well - discussed importance of f/u with George [name] and monitoring BG at home) - Diabetic coordinator - A1c is 7.3 - check HbA1C  Altered mental status/delirium -delirium precautions - utox positive for MJ -resolved  Acute Infarct R Parietal Cortex  Subacute Infarct R Cerebellum  Hx CVA: -Hx L PCA infarct in June - Was on ASA/plavix and supposed to stop plavix and continue ASA alone after 9/19 (admitting provider started on plavix- have added aspirin, will deferfinal planto neuro) - Thought 2/2 cardioembolic source -> they'd planned for 30 day event monitor as echo and TEE were negative for PFO or idenifiable cardiac source of embolus and hypercoagulable/vasculitis w/u was negative per recent neuro note - will reconsult neurology in setting of acute stroke, appreciate recs- recurrent cryptogenic strokes - recommend ASA and plavix x3 weeks then plavix alone.  Of note, discharged with 81 mg ASA with  plavix, but on review of neuro note - intended to be 325 mg aspirin with plavix x21 days then plavix alone.  Called mother to correct this after discharge - she expressed understanding.  Hypertension  Tachycardia Significantly elevated BP's this AM His clonidine and coreg were being held -> which likely contributed to rebound hypertension and tachycardia Resume clonidine and coreg and amlodipine - BP still significantly elevated - will add hydralazine at d/c to help gradually normalize BP after stroke - needs close follow up with George [name] EKG with sinus tachycardia  Acute kidney injury -Creatinine of 4.11 on admission - improved to 3.02 today - good UOP - baseline creatinine ~2.2-2.4 - renal US without hydro, distended bladder noted(per nursing 9/26, bladder scan showed around 500 cc, then he voided as much) - creatinine improving - follow with nephrology as outpatient -Continue to monitor serial BMP -Avoidnephrotoxic agent  Leukocytosis: improving, follow, follow cx- given sludge vs stone in gallbladder on imaging, will follow RUQ [place] as he did have N/V on presentation - RUQ  wnl  Gram Positive Rods in 1/2 Cx: suspect contaminant, improving off abx - follow repeat cx (NGTD)  In their interim the patient's had no additional episodes of fever or DKA.  His glycemic control has been such that he occasionally runs in the 70 range early in the morning but will rebound over 200 late in the day.  It is been difficult to control with the use of long-acting NPH twice daily 8 units in the morning 12 units in the evening at meals and then associate that with short acting Humalog  The patient has since followed up with cardiology with an event monitor showing no evidence of arrhythmia therefore no evidence for embolic event.  Neurology has seen the patient and recommended switch from atorvastatin to Crestor discontinuance of a full dose aspirin after 21 days and continuing Plavix for  life  Blood pressure control continue with amlodipine clonidine and Coreg The patient also has follow-up with [facility] scheduled creatinine is in the 3 range Also an ophthalmology visit was obtained and there is apparently blood in the eyes with diabetic retinopathy  The patient does need a retinal eye referral  1/25  Since the last visit in December the patient required another admission for recurrent stroke in the cerebellar area.  The patient had ongoing nausea and went back into diabetic ketoacidosis as well.  Below is a copy of the discharge summary.  There is a follow-up appointment with nephrology upcoming and the endocrinology appointment but out until September therefore we need to intervene on that to get an earlier appointment  Also neurology appointment is pending from the hospitalization  Below is a copy of the discharge summary  Note this is a transition of care visit and below the discharge summary is the nurse case manager note    d/c summar y: Date of Admission: 01/11/2020                      Date of Discharge: 01/13/20  Admitting Physician: Lyndee Hensen, DO  Primary Care Provider: Elsie Stain, MD Consultants: Neurology   Indication for Hospitalization: DKA  Discharge Diagnoses/Problem List:  DKA in T1DM Fever Hematemesis Non-intractable nausea and vomiting Subacute CVA Acute on chronic CKD stage IV Anemia GERD  Disposition: Home   Discharge Condition: Improved   Discharge Exam:   BMW:UXLKG and conversational, in no acute distress CV: regular rate and rhythm, no murmurs appreciated RESP: no increased work of breathing, clear to ascultation bilaterally with no crackles, wheezes, or rhonchi  ABD: Bowel sounds present. Soft, Nontender, Nondistended.  MSK: no edema, or cyanosis noted SKIN: warm, dry NEURO: grossly normal, moves all extremities appropriately, alert and oriented PSYCH:Normal affect and thought content  Brief  Hospital Course:   Type 1 diabetes  nausea vomiting  DKA  hematemesis Trip [name] is a 34 y.o. male presented after 3 days of vomiting that progressed to bloody emesis which prompted patient to seek care.  Mom reported patient had CBGs ranging 120-320.  On arrival, patient tachycardic (HR120s) and hypertensive (SBP 170s).  Labs concerning for mild DKA with BHB 3.73,CBG300,bicarb17, pH 7.4with elevated anion gap of 24.  He was given 2 L bolus in the ED. DKA protocol initiated and patient started on insulin drip and IV potassium.    Patient was transitioned to SQ insulin on 01/12/20.  Etiology of DKA unclear however patient did have of subacute stroke on CT head which could have been the precipitating factor for his  DKA. Overall, Pt's diabetes is controlled as A1c was 7.2.  Mom and patient wanted to start the hospital regimen of Lantus and NovoLog in the outpatient setting.  Patient was discharged with 15 Lantus and 3 units (Novolog) mealtime coverage.  Patient has a pending referral for [facility] endocrinology.   Acute encephalopathy   subacute CVA George had inappropriate answers to orientation questions (What year is it "8th",Whats your name "august 12th",Where are you " working on it") thus CT head was obtained.  CT head indicated 2 cm subacute infarct in the cerebellum that was not present on previous imaging.  Patient with history of 2 ischemic strokes in the past 6 months.  Neurology was consulted and recommended dual antiplatelet therapy for the next 3 weeks followed by monotherapy with Plavix thereafter.  MRA head and neck obtained did not show any emergent findings of flow limiting stenosis.  Patient to follow-up with stroke clinic at [facility] neurology patient in 4 weeks.    Hematemesis Patient with vomiting that progressed to blood-tinged emesis over the past several days.  Suspect small Mallory-Weiss tear due to frequent retching.  For GI prophylaxis, Protonix infusion  initiated in the ED.   Hematemesis resolved on day 1 of admission.  Hemoglobin remained reassuring throughout his admission. He was transitioned to oral Protonix prior to discharge.   Hypertension Due to persistent vomiting, patient was unable to take antihypertensives.  On arrival SBP's were in the 170s.  Permissive hypertension was allowed for 72 hours for subacute stroke.    Acute on chronic CKD stage IV Creatinine on admission 4.24, likely prerenal in the setting of dehydration due to vomiting.  Creatinine returned to baseline prior to discharge.  Patient to follow up outpatient with [facility].  Fever Patient had isolated fever of 100.5 F on 01/12/20.  Leukocytosis (WBC 25.4) likely due to leukemoid reaction secondary to DKA or hemoconcentration secondary to dehydration.  Blood cultures have no growth to date and urine cultures had insignificant growth.  Lactic acid was reassuring at 0.7.  No empiric antibiotics were started.   Issues for Follow Up:  1. Continue titrating patient's basal (Lantis) and short-acting insulin (Novolog).  2. Patient with permissive hypertension for the next few days.  We discontinued Clonidine. If needed, consider clonidine patch instead tablets. He should resume all other antihypertensive medications. 3. UDS was positive for THC.  Continue talking with the patient about the risk of THC use and stroke.  TCM note from CM RN: Note  Transition Care Management Follow-up Telephone Call  Call completed with patient's mother, George [name].  DPR on file to speak with her.    Date of discharge and from where: 01/13/2020, [facility]   How have you been since you were released from the hospital? His mother reported that he is tired but doing well. Nausea has improved. She said that pharmacist recommended dramamine for the nausea and that has helped him and he is starting to eat again.   Any questions or concerns? No questions/ concerns at this time    Items Reviewed:  Did the pt receive and understand the discharge instructions provided? She has the instructions and has no questions.   Medications obtained and verified? yes, he has all medications including the new meds.  His mother is aware of the medications that have been discontinued.  She said that Saturday, 01/14/2020 he was " wiped out" and he attributed that to the lantus so yesterday, he split the lantus dose and took  7.5 units in the morning and 7.5 units in the evening.  She does realize that he may just be tired as a result of the hospitalization.   Any new allergies since your discharge? none reported   Do you have support at home? Lives with his parents  Other (ie: DME, Home Health, etc) no home health ordered.   Has glucometer, his mother said that his blood sugars usually run in  the low 200s  Functional Questionnaire: (I = Independent and D = Dependent) ADL's: independent, mother helps oversee medication regime   Follow up appointments reviewed:    PCP Hospital f/u appt confirmed? Appointment scheduled with Dr Joya [name] 01/23/2020 @ [facility] f/u appt confirmed? Nephrology- 01/25/2020, mother will call to schedule endocrinology and neurology appts  Are transportation arrangements needed? No, parents provide transportation  If their condition worsens, is the pt aware to call  their PCP or go to the ED?yes  Was the patient provided with contact information for the PCP's office or ED? Yes. His mother has the phone number for the clinic  Was the pt encouraged to call back with questions or concerns?yes  Since discharge the patient knows he does have a nephrology appointment coming up on 27 January but is still pending endocrine and neurology.  He states his nausea has resolved.  He also has yet to achieve a retinal appointment with Dr. Zigmund Daniel.  In looking at the patient's medication regimen it appears the NPH dosing he was on failed the patient  and he will need more consistent insulin dosing.  Note since discharge the patient has been on the Lantus dosing and this appears to have caused improved control of the diabetes and he is on a 15 unit daily dosing of this and 5 units 3 times daily of NovoLog at meals    04/03/2020>>no showed.  4/20 since last OV which actually occurred in January 2021 the patient has been to endocrinology and also neurology.  He has had prior history of recurrent strokes in the cerebellar area and plan previously had been to continue Plavix 75 mg daily and aspirin 81 mg daily for a total therapy of 3 weeks and neurology then saw the patient in February:  From that visit the following recommendations were given by neurology   1. Multiple recurrent strokes:  Continue clopidogrel 75 mg daily and Crestor 20 mg daily for secondary stroke prevention. Maintain strict control of hypertension with blood pressure goal below 130/90, diabetes with hemoglobin A1c goal below 6.5% and cholesterol with LDL cholesterol (bad cholesterol) goal below 70 mg/dL.  I also advised the patient to eat a healthy diet with plenty of whole grains, cereals, fruits and vegetables, exercise regularly with at least 30 minutes of continuous activity daily and maintain ideal body weight. 2. HTN: Advised to continue current treatment regimen.  Encouraged to monitor blood pressure at home routinely to ensure satisfactory management.  Advised him to follow-up with cardiology or PCP for ongoing management and monitoring 3. HLD: Continue Crestor 20 mg daily and ongoing monitoring management by PCP 4. DMI: Advised to continue to monitor glucose levels at home along with continued follow-up with endocrinology for management and monitoring 5. CKD stage III: Continue to follow nephrology as scheduled 6. Visual impairment: post versus diabetic retinopathy.  Highly encouraged ensuring evaluation by retina specialist for further evaluation 7. Tobacco/THC use:  Discussion with patient regarding importance of cessation due to increased risk of reoccurring stroke and cardiac disease with ongoing  use.  He verbalized understanding.  Note the patient does have a retinal specialist visit scheduled in May.  Patient also has follow-up with renal in May along with endocrinology as well.  Patient notes his blood pressures at home in the 130 over 90s range but here in the office remain elevated.  His blood glucoses often are as low as 93-100 but after eating and her first thing in the morning they can run as high as 380.  He appears to be skipping his evening doses of NovoLog.  He is concerned about with his gastroparesis he may become low but his low sugars only been in the low 80s.  The patient continues to smoke a cigarette air or 2 every 2 days.  He knows he needs to stop smoking completely  At the February endocrine visit the following recommendations were given   I explained to the patient that diabetic patients are at higher than normal risk for amputations.  His A1c is falsely skewed downwards due to CKD  I have praised the patient on frequent glucose checks and having that data available to me today, and review of his meter download he is noted to have fluctuating BG's ranging from severe hyperglycemia to hypoglycemia, patient admits to dietary indiscretions, he also tends to snack sometimes without a prandial coverage patient also tends to bolus after a meal, patient also tends to get sometimes on the correction dose, we discussed the risk of hypoglycemia especially in the setting of CKD 4.   I have recommended that he takes 2 units of NovoLog with snacks, I have explained to him that if he eats anything with carbohydrates and no prandial coverage he will have hypoglycemia.  I will also give him a correction scale so he does not guess on a correction dose.  I have recommended the following adjustments to his regimen,   MEDICATIONS: Continue lantus 17  units daily Novolog 5 units with each meal NovoLog 2 units with a snack CF: NovoLog (BG-130/55)   EDUCATION / INSTRUCTIONS:  BG monitoring instructions: Patient is instructed to check his blood sugars 4 times a day, before meals and bedtime.  Call [place] [facility] if: BG persistently < 70 or > 300.  I reviewed the Rule of 15 for the treatment of hypoglycemia in detail with the patient. Literature supplied.   2) Diabetic complications:   Eye: Does  have known diabetic retinopathy.   Neuro/ Feet: Does night have known diabetic peripheral neuropathy.  Renal: Patient does  have known baseline CKD. He is not on an ACEI/ARB at present.  3) Lipids: Patient is on a statin.    4) Hypertension: He is above goal of < 140/90 mmHg.  Will defer further management to his PCP   Note today his CBG is 144 today 144 Note the patient did receive his Covid vaccine series with Pfizer ending April 13  07/25/2020 Since the last visit the patient has been doing fairly well.  His glycemic control is improved he has seen renal and his renal function remains stage IV CKD with GFR in the low 20s.  He is still smoking an occasional 1 cigarette periodically when he is with his girlfriend.  He has received 3 injections in the to treat diabetic retinopathy at [facility] eye care.  His vision has improved with these treatments.  Hemoglobin A1c last checked in May was 7.3.  Blood pressure control has improved as well at this time  Past Medical History:  Diagnosis Date  .  AKI (acute kidney injury) ([place])   . Cerebral infarction ([place])   . Hypertension   . Neurologic deficit due to acute ischemic cerebrovascular accident (CVA) ([place]) 06/13/2019  . Supraventricular tachycardia ([place])   . Type 1 diabetes ([facility])      Family History  Problem Relation Age of Onset  . Hypertension Mother   . Diabetes Mother   . Hyperlipidemia Mother   . Diabetes Father      Social History   Socioeconomic History  .  Marital status: Single    Spouse name: Not on file  . Number of children: Not on file  . Years of education: Not on file  . Highest education level: Not on file  Occupational History  . Not on file  Tobacco Use  . Smoking status: Former Smoker    Packs/day: 0.25    Types: Cigarettes    Start date: 12/29/2010    Quit date: 06/25/2020    Years since quitting: 0.0  . Smokeless tobacco: Never Used  Vaping Use  . Vaping Use: Former  . Start date: 12/30/2015  . Quit date: 12/29/2017  Substance and Sexual Activity  . Alcohol use: Not on file    Comment: 6 beers/week  . Drug use: Not Currently  . Sexual activity: Not Currently  Other Topics Concern  . Not on file  Social History Narrative  . Not on file   Social Determinants of Health   Financial Resource Strain:   . Difficulty of Paying Living Expenses:   Food Insecurity:   . Worried About Charity fundraiser in the Last Year:   . Arboriculturist in the Last Year:   Transportation Needs:   . Film/video editor (Medical):   Marland Kitchen Lack of Transportation (Non-Medical):   Physical Activity:   . Days of Exercise per Week:   . Minutes of Exercise per Session:   Stress:   . Feeling of Stress :   Social Connections:   . Frequency of Communication with Friends and Family:   . Frequency of Social Gatherings with Friends and Family:   . Attends Religious Services:   . Active Member of Clubs or Organizations:   . Attends Archivist Meetings:   Marland Kitchen Marital Status:   Intimate Partner Violence:   . Fear of Current or Ex-Partner:   . Emotionally Abused:   Marland Kitchen Physically Abused:   . Sexually Abused:      No Known Allergies   Outpatient Medications Prior to Visit  Medication Sig Dispense Refill  . insulin glargine (LANTUS SOLOSTAR) 100 UNIT/ML Solostar Pen Inject 18 Units into the skin daily. 15 mL 11  . amLODipine (NORVASC) 10 MG tablet Take 1 tablet (10 mg total) by mouth daily. 90 tablet 2  . carvedilol (COREG) 25 MG tablet  Take 1 tablet (25 mg total) by mouth 2 (two) times daily with a meal. 180 tablet 1  . clopidogrel (PLAVIX) 75 MG tablet Take 1 tablet (75 mg total) by mouth daily. 90 tablet 3  . glucose blood (ONETOUCH VERIO) test strip Use as instructed to check blood sugar 3 times daily. 100 each 11  . hydrALAZINE (APRESOLINE) 100 MG tablet Take 100 mg by mouth 3 (three) times daily.    . insulin aspart (NOVOLOG FLEXPEN) 100 UNIT/ML FlexPen Inject 5 Units into the skin 3 (three) times daily with meals. Mac daily 30 units to include correction scale and snack coverage 15 mL 3  . Insulin Pen Needle (PEN  NEEDLES 3/16") 31G X 5 MM MISC To inject insulin with novolog flex pen, 3 times daily with meals. 100 each 4  . pantoprazole (PROTONIX) 40 MG tablet Take 1 tablet (40 mg total) by mouth daily. For stomach protection 30 tablet 11  . rosuvastatin (CRESTOR) 20 MG tablet Take 1 tablet (20 mg total) by mouth daily. 30 tablet 4  . acetaminophen (TYLENOL) 325 MG tablet Take 650 mg by mouth every 6 (six) hours as needed for headache.    . hydrALAZINE (APRESOLINE) 50 MG tablet Take 1 tablet (50 mg total) by mouth 3 (three) times daily. 270 tablet 1   No facility-administered medications prior to visit.    Review of Systems  Constitutional: Negative for activity change, appetite change, chills, diaphoresis, fatigue, fever and unexpected weight change.  HENT: Negative.   Eyes: Negative for visual disturbance.  Respiratory: Negative.   Cardiovascular: Negative.  Negative for palpitations.  Gastrointestinal: Negative for constipation.  Endocrine: Negative.   Genitourinary: Negative.   Musculoskeletal: Negative.   Neurological: Negative for dizziness, tremors, seizures, syncope, facial asymmetry, speech difficulty, light-headedness, numbness and headaches.  Hematological: Negative for adenopathy. Does not bruise/bleed easily.  Psychiatric/Behavioral: Negative.        Objective:   Physical Exam Vitals:   07/25/20  0927 07/25/20 1223  BP: (!) 165/86 (!) 140/80  Pulse: 85   Resp: 17   Temp: (!) 97.3 F (36.3 C)   TempSrc: Temporal   SpO2: 96%   Weight: 128 lb (58.1 kg)    Gen: Pleasant, well-nourished, in no distress,  normal affect  ENT: No lesions,  mouth clear,  oropharynx clear, no postnasal drip, poor dentition  Neck: No JVD, no TMG, no carotid bruits  Lungs: No use of accessory muscles, no dullness to percussion, clear without rales or rhonchi  Cardiovascular: RRR, heart sounds normal, no murmur or gallops, no peripheral edema  Abdomen: soft and NT, no HSM,  BS normal  Musculoskeletal: No deformities, no cyanosis or clubbing  Neuro: alert, non focal  Skin: Warm, no lesions or rashes   BMP Latest Ref Rng & Units 04/17/2020 01/23/2020 01/13/2020  Glucose 65 - 99 mg/dL 77 114(H) 184(H)  BUN 6 - 20 mg/dL 26(H) 35(H) 24(H)  Creatinine 0.76 - 1.27 mg/dL 3.17(H) 3.25(H) 2.83(H)  BUN/Creat Ratio 9 - 20 8(L) 11 -  Sodium 134 - 144 mmol/L 142 142 137  Potassium 3.5 - 5.2 mmol/L 4.6 4.9 4.0  Chloride 96 - 106 mmol/L 103 103 109  CO2 20 - 29 mmol/L _0 Calcium 8.7 - 10.2 mg/dL 10.0 9.6 8.2(L)   CBC Latest Ref Rng & Units 04/17/2020 01/23/2020 01/11/2020  WBC 3.4 - 10.8 x10E3/uL 15.2(H) 11.7(H) 25.4(H)  Hemoglobin 13.0 - 17.7 g/dL 12.7(L) 14.5 11.8(L)  Hematocrit 37.5 - 51.0 % 37.3(L) 43.4 34.5(L)  Platelets 150 - 450 x10E3/uL 222 348 203       Assessment & Plan:  I personally reviewed all images and lab data in the [facility] system as well as any outside material available during this office visit and agree with the  radiology impressions.   Type 1 diabetes mellitus with hyperglycemia (HCC) Type 1 diabetes with improved glycemic control follow-up per endocrinology continue current dose of NovoLog and Lantus  HTN (hypertension) Hypertension improved from previous values  Continue current medication profile to include the recent increase of hydralazine to 100 mg 3 times  daily  Hyperlipidemia due to type 1 diabetes mellitus ([place]) LDL currently at goal we  will continue current dose of Crestor  Proliferative diabetic retinopathy associated with type 1 diabetes mellitus ([place]) Proliferative diabetic retinopathy under treatment now with [facility]  Type 1 diabetes mellitus with stage 4 chronic kidney disease ([place]) Stage IV chronic kidney disease follow-up per renal  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  Diagnoses and all orders for this visit:  Type 1 diabetes mellitus with stage 3 chronic kidney disease, unspecified whether stage 3a or 3b CKD (HCC) -     Hemoglobin A1c  Essential hypertension -     amLODipine (NORVASC) 10 MG tablet; Take 1 tablet (10 mg total) by mouth daily.  Hyperlipidemia due to type 1 diabetes mellitus ([place])  Need for hepatitis C screening test -     Hepatitis C Antibody  Long term current use of aspirin -     pantoprazole (PROTONIX) 40 MG tablet; Take 1 tablet (40 mg total) by mouth daily. 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 -     glucose blood (ONETOUCH VERIO) test strip; Use as instructed to check blood sugar 3 times daily. -     hydrALAZINE (APRESOLINE) 100 MG tablet; Take 1 tablet (100 mg total) by mouth 3 (three) times daily. -     rosuvastatin (CRESTOR) 20 MG tablet; Take 1 tablet (20 mg total) by mouth daily. -     carvedilol (COREG) 25 MG tablet; Take 1 tablet (25 mg total) by mouth 2 (two) times daily with a meal. -     insulin aspart (NOVOLOG FLEXPEN) 100 UNIT/ML FlexPen; Inject 5 Units into the skin 3 (three) times daily with meals. Mac daily 30 units to include correction scale and snack coverage -     clopidogrel (PLAVIX) 75 MG tablet; Take 1 tablet (75 mg total) by mouth daily.    Hepatitis C screen will be obtained today as well

## 2020-07-25 NOTE — Assessment & Plan Note (Signed)
Ongoing tobacco use I spent 10 minutes with this patient strongly encouraged him to quit smoking his last few cigarettes that he has taken telling him that it is causing progression in his eyesight loss and worsening hypertension and renal function

## 2020-07-25 NOTE — Assessment & Plan Note (Signed)
Type 1 diabetes with improved glycemic control follow-up per endocrinology continue current dose of NovoLog and Lantus

## 2020-07-25 NOTE — Assessment & Plan Note (Signed)
Stage IV chronic kidney disease follow-up per renal

## 2020-07-25 NOTE — Assessment & Plan Note (Addendum)
Hypertension improved from previous values  Continue current medication profile to include the recent increase of hydralazine to 100 mg 3 times daily

## 2020-07-25 NOTE — Patient Instructions (Signed)
No change in medications A1C and Hep C level checked today Please get your teeth cleaned at dentist this year  Refills on all meds sent to your local pharmacy   Preventive Dental Care, Adult Preventive dental care includes seeing a dentist regularly and practicing good dental care (oral hygiene) at home. These actions can help to prevent cavities, root canal problems, gum disease (gingivitis), tooth loss, and other tooth problems. Regular dental exams may also help your health care provider diagnose other medical problems. Many diseases, including mouth cancers, have early signs that can be found during a preventive dental care visit. What can I expect for my preventive dental care visit? Counseling At your visit, your dentist may ask you about:  Your overall health and diet.  Any new symptoms, such as: ? Bleeding gums. ? Mouth, tooth, or jaw pain. ? Dull headache.  Using a mouthguard for sports or because of teeth grinding.  The need or desire to get braces to straighten teeth (orthodontic care). Physical exam Your dentist will do a mouth (oral) exam to check for:  Cavities.  Gum disease or other problems.  Signs of cancer.  Neck swelling or lumps.  Abnormal jaw movement or pain in the jaw joint.  Signs of teeth grinding, such as loose or fractured teeth. Other services You may also have:  Dental X-rays.  Your teeth cleaned. Follow these instructions at home: Oral health      Brush with fluoride toothpaste every morning and night. If possible, brush within 10 minutes after every meal. Ask your dentist for toothpaste recommendations.  Floss at least one time every day.  Check your teeth for white or brown spots after brushing. These may be signs of cavities.  Check your gums for swelling or bleeding. These may be signs of gum disease.  Take over-the-counter and prescription medicines only as told by your dentist.  If you have a permanent tooth knocked  out: ? Find the tooth. ? Pick it up by the top (crown) with a tissue or gauze. ? Wash the tooth for no more than 10 seconds under cold, running water. ? Try to put the tooth back into the gum socket. ? Put the tooth in a glass of milk if you cannot get it back in place. ? Go to your dentist or an emergency department right away. Take the tooth with you. Eating and drinking  Eat a diet that includes plenty of fruits, vegetables, milk and dairy products, whole grains, and proteins. Do not eat a lot of starchy foods or foods with added sugar. Talk with your health care provider if you have questions about following a healthy diet.  Avoid sodas, sugary snacks, and sticky candies.  Choose water or milk instead of fruit juice, sodas, or sports drinks. General instructions  Do not use any products that contain nicotine or tobacco, such as cigarettes, e-cigarettes, and chewing tobacco. If you need help quitting, ask your health care provider.  Do not get mouth piercings.  Always wear a mouthguard when playing contact or collision sports. For more information:  American Dental Association: www.mouthhealthy.org Contact a dentist if you have:  Gum, tooth, or jaw pain.  Red, swollen, or bleeding gums.  A tooth or teeth that are very sensitive to hot or cold.  Very bad breath.  A problem with a filling, crown, implant, or denture.  A broken or loose tooth.  A growth or sore in your mouth that is not going away.  A dry  mouth. What's next?  See your dentist one or two times each year for oral exams and cleanings.  If you have an early problem, like a cavity, your dentist will schedule time for you to get treatment. If you have a tooth root problem, gum disease, or a sign of another disease, your dentist may send you to see another health care provider for care. This information is not intended to replace advice given to you by your health care provider. Make sure you discuss any  questions you have with your health care provider. Document Revised: 10/08/2018 Document Reviewed: 08/02/2018 Elsevier Patient Education  [place].

## 2020-07-25 NOTE — Assessment & Plan Note (Signed)
History of stroke currently stable at this time on Plavix and blood pressure control

## 2020-07-25 NOTE — Assessment & Plan Note (Addendum)
LDL currently at goal we will continue current dose of Crestor

## 2020-07-25 NOTE — Assessment & Plan Note (Signed)
Proliferative diabetic retinopathy under treatment now with [facility]

## 2020-07-26 LAB — HEMOGLOBIN A1C
Est. average glucose Bld gHb Est-mCnc: 128 mg/dL
Hgb A1c MFr Bld: 6.1 % — ABNORMAL HIGH (ref 4.8–5.6)

## 2020-07-26 LAB — HEPATITIS C ANTIBODY: Hep C Virus Ab: <0.1 s/co ratio (ref 0.0–0.9)

## 2020-08-06 DIAGNOSIS — Z0271 Encounter for disability determination: Secondary | ICD-10-CM

## 2020-08-30 ENCOUNTER — Ambulatory Visit: Payer: Medicaid Other | Admitting: Internal Medicine

## 2020-09-07 ENCOUNTER — Other Ambulatory Visit: Payer: Self-pay

## 2020-09-07 ENCOUNTER — Encounter: Payer: Self-pay | Admitting: Internal Medicine

## 2020-09-07 ENCOUNTER — Ambulatory Visit (INDEPENDENT_AMBULATORY_CARE_PROVIDER_SITE_OTHER): Payer: Medicaid Other | Admitting: Internal Medicine

## 2020-09-07 VITALS — BP 128/70 | HR 83 | Ht 63.0 in | Wt 121.0 lb

## 2020-09-07 DIAGNOSIS — N184 Chronic kidney disease, stage 4 (severe): Secondary | ICD-10-CM

## 2020-09-07 DIAGNOSIS — N183 Chronic kidney disease, stage 3 unspecified: Secondary | ICD-10-CM | POA: Diagnosis not present

## 2020-09-07 DIAGNOSIS — N189 Chronic kidney disease, unspecified: Secondary | ICD-10-CM | POA: Diagnosis not present

## 2020-09-07 DIAGNOSIS — E1022 Type 1 diabetes mellitus with diabetic chronic kidney disease: Secondary | ICD-10-CM

## 2020-09-07 DIAGNOSIS — E10319 Type 1 diabetes mellitus with unspecified diabetic retinopathy without macular edema: Secondary | ICD-10-CM | POA: Diagnosis not present

## 2020-09-07 NOTE — Patient Instructions (Addendum)
-   Continue  lantus 18 units daily - Novolog 5 units with each meal - Snack : Novolog: Insulin to carb ratio of 1:15   - Novolog correctional insulin: ADD extra units on insulin to your meal-time novolog dose if your blood sugars are higher than . Use the scale below to help guide you:   Blood sugar before meal Number of units to inject  Less than 185 0 unit  186-  240 1 units  241-  295 2 units  296-  350 3 units  351 -  405 4 units     -HOW TO TREAT LOW BLOOD SUGARS (Blood sugar LESS THAN 70 MG/DL)  Please follow the RULE OF 15 for the treatment of hypoglycemia treatment (when your (blood sugars are less than 70 mg/dL)    STEP 1: Take 15 grams of carbohydrates when your blood sugar is low, which includes:   3-4 GLUCOSE TABS  OR  3-4 OZ OF JUICE OR REGULAR SODA OR  ONE TUBE OF GLUCOSE GEL     STEP 2: RECHECK blood sugar in 15 MINUTES STEP 3: If your blood sugar is still low at the 15 minute recheck --> then, go back to STEP 1 and treat AGAIN with another 15 grams of carbohydrates.

## 2020-09-07 NOTE — Progress Notes (Signed)
Name: [facility]  Age/ Sex: 34 y.o., male   MRN/ DOB: [id number], [birth year]     PCP: Elsie Stain, MD   Reason for Endocrinology Evaluation: Type 1 Diabetes Mellitus  Initial Endocrine Consultative Visit: 02/07/2020    PATIENT IDENTIFIER: [name] is a 34 y.o. male with a past medical history of HTN, T1DM and hx of CVA . The patient has followed with Endocrinology clinic since 2/9.2021 for consultative assistance with management of his diabetes.  DIABETIC HISTORY:  George [name] was diagnosed with T1DM at age 31. His hemoglobin A1c has ranged from 7.2% in 2021, peaking at 8.2%    SUBJECTIVE:   During the last visit (05/05/2020): A1c 7.2% ( Skewed due to CKD)  Today (09/10/2020): George [name] is here for a follow up on diabetes.  He checks his blood sugars 4 times daily, preprandial. The patient has had hypoglycemic episodes since the last clinic visit, which typically occur 2 x /month - most often occuring after a bolus. The patient is  symptomatic with these episodes.    HOME DIABETES REGIMEN:  Lantus 18 units daily  Novolog 5 units TIDQAC Novolog with snacks 1:15  ( if sugary more then beady)  CF : Novolog (Bg-130/55)     Statin: Yes ACE-I/ARB: No    METER DOWNLOAD SUMMARY: Date range evaluated: 48/28-9/09/2020 Fingerstick Blood Glucose Tests = 60 Average Number Tests/Day = 4.1 Overall Mean FS Glucose = 200  BG Ranges: Low = 42 High = 447   Hypoglycemic Events/30 Days: BG < 50 = 0 Episodes of symptomatic severe hypoglycemia = 0    DIABETIC COMPLICATIONS: Microvascular complications:   Left retinopathy, CKD   Denies:neuropathy  Last eye exam: Completed 08/15/2020  Macrovascular complications:   CVA 05/6598 , 09/2019 and 12/2019  Denies: CAD, PVD,   HISTORY:  Past Medical History:  Past Medical History:  Diagnosis Date  . AKI (acute kidney injury)  ([place])   . Cerebral infarction ([place])   . Hypertension   . Neurologic deficit due to acute ischemic cerebrovascular accident (CVA) ([place]) 06/13/2019  . Supraventricular tachycardia ([place])   . Type 1 diabetes ([place])    Past Surgical History:  Past Surgical History:  Procedure Laterality Date  . BUBBLE STUDY  06/20/2019   Procedure: BUBBLE STUDY;  Surgeon: Fay Records, MD;  Location: [facility];  Service: Cardiovascular;;  . TEE WITHOUT CARDIOVERSION N/A 06/20/2019   Procedure: TRANSESOPHAGEAL ECHOCARDIOGRAM (TEE);  Surgeon: Fay Records, MD;  Location: [facility];  Service: Cardiovascular;  Laterality: N/A;    Social History:  reports that he quit smoking about 2 months ago. His smoking use included cigarettes. He started smoking about 9 years ago. He smoked 0.25 packs per day. He has never used smokeless tobacco. He reports previous drug use. No history on file for alcohol use. Family History:  Family History  Problem Relation Age of Onset  . Hypertension Mother   . Diabetes Mother   . Hyperlipidemia Mother   . Diabetes Father      HOME MEDICATIONS: Allergies as of 09/07/2020   No Known Allergies     Medication List       Accurate as of September 07, 2020 11:59 PM. If you have any questions, ask your nurse or doctor.        amLODipine 10 MG tablet Commonly known as: NORVASC Take 1 tablet (10 mg total) by mouth daily.   carvedilol 25 MG tablet Commonly known as: COREG Take 1 tablet (  25 mg total) by mouth 2 (two) times daily with a meal.   clopidogrel 75 MG tablet Commonly known as: PLAVIX Take 1 tablet (75 mg total) by mouth daily.   hydrALAZINE 100 MG tablet Commonly known as: APRESOLINE Take 1 tablet (100 mg total) by mouth 3 (three) times daily.   Lantus SoloStar 100 UNIT/ML Solostar Pen Generic drug: insulin glargine Inject 18 Units into the skin daily.   NovoLOG FlexPen 100 UNIT/ML FlexPen Generic drug: insulin aspart Inject 5 Units into the skin 3  (three) times daily with meals. Mac daily 30 units to include correction scale and snack coverage   OneTouch Verio test strip Generic drug: glucose blood Use as instructed to check blood sugar 3 times daily.   pantoprazole 40 MG tablet Commonly known as: PROTONIX Take 1 tablet (40 mg total) by mouth daily. For stomach protection   Pen Needles 3/16" 31G X 5 MM Misc To inject insulin with novolog flex pen, 3 times daily with meals.   rosuvastatin 20 MG tablet Commonly known as: Crestor Take 1 tablet (20 mg total) by mouth daily.        OBJECTIVE:   Vital Signs: BP 128/70 (BP Location: Left Arm, Patient Position: Sitting, Cuff Size: Normal)   Pulse 83   Ht _0  (1.6 m)   Wt 121 lb (54.9 kg)   SpO2 98%   BMI 21.43 kg/m   Wt Readings from Last 3 Encounters:  09/07/20 121 lb (54.9 kg)  07/25/20 128 lb (58.1 kg)  07/12/20 121 lb (54.9 kg)     Exam: General: Pt appears well and is in NAD  Lungs: Clear with good BS bilat with no rales, rhonchi, or wheezes  Heart: RRR with normal S1 and S2 and no gallops; no murmurs; no rub  Abdomen: Normoactive bowel sounds, soft, nontender, without masses or organomegaly palpable  Extremities: No pretibial edema. No tremor.  Skin: Normal texture and temperature to palpation.   Neuro: MS is good with appropriate affect, pt is alert and Ox3          DATA REVIEWED:  Lab Results  Component Value Date   HGBA1C 6.1 (H) 07/25/2020   HGBA1C 7.3 (A) 05/07/2020   HGBA1C 7.2 (H) 01/11/2020   Lab Results  Component Value Date   LDLCALC 55 04/17/2020   CREATININE 3.17 (H) 04/17/2020    Lab Results  Component Value Date   CHOL 118 04/17/2020   HDL 47 04/17/2020   LDLCALC 55 04/17/2020   TRIG 81 04/17/2020   CHOLHDL 2.5 04/17/2020         ASSESSMENT / PLAN / RECOMMENDATIONS:    1) Type 1 Diabetes Mellitus, Poorly controlled, With retinopathic and CKD IV complications - Most recent A1c of 6.1 %. Goal A1c < 7.0 %.     - A1c  skewed due to CKD, also due to iron intake.  - His actual  A1c based on glucose meter is ~ 8.5%  - Pt has been noted with hypoglycemia this is mainly during the day, this again is due to insulin carb mismatch, I have offered him a referral to our RD but he declines. I believe that at this time this is the best we can do, pt has his own way of adjusting insulin and unless he changes his habits his glucose readings are not going to change. For example he says he takes novolog 5 units with meals but he also admits to increasing or decreasing this based on the  meal ( which would results in insulin carb mismatch) . On the last visit I have advised him to use insulin to carb ratio of 1:15 with snacks, but today he tells me he uses this ratio if his snack is less of the "bready" side and more on the sugary side ?  - Pt has declined a CGM     Plan: MEDICATIONS: - Continue lantus to 18 units daily - Novolog 5 units with each meal - Snack : Novolog: Insulin to carb ratio of 1:15  - CF: Novolog ( BG- 130/55)   EDUCATION / INSTRUCTIONS:  BG monitoring instructions: Patient is instructed to check his blood sugars 4 times a day, before meals and bedtime .  Call [facility] if: BG persistently < 70 or > 300. . I reviewed the Rule of 15 for the treatment of hypoglycemia in detail with the patient. Literature supplied.   2) Diabetic complications:   Eye: Does have known diabetic retinopathy.   Neuro/ Feet: Does not  have known diabetic peripheral neuropathy .   Renal: Patient does  have known baseline CKD. He   is not  on an ACEI/ARB at present.     F/U in 4 months    Signed electronically by: Mack Guise, MD  [facility]  [facility] [address] [place], [place] [address] Phone: [phone] FAX: [phone]   CC: Elsie Stain, MD [address] [place] [address] Phone: [phone]  Fax:  [phone]  Return to Endocrinology clinic as below: Future Appointments  Date Time Provider [place]  12/27/2020 11:00 AM Elsie Stain, MD [id number] None  01/16/2021 10:45 AM Frann Rider, NP [facility] None  03/07/2021 10:30 AM Ruqayya Ventress, Melanie Crazier, MD [id number] None

## 2020-09-11 DIAGNOSIS — E1022 Type 1 diabetes mellitus with diabetic chronic kidney disease: Secondary | ICD-10-CM | POA: Diagnosis not present

## 2020-09-11 DIAGNOSIS — N2581 Secondary hyperparathyroidism of renal origin: Secondary | ICD-10-CM | POA: Diagnosis not present

## 2020-09-11 DIAGNOSIS — D631 Anemia in chronic kidney disease: Secondary | ICD-10-CM | POA: Diagnosis not present

## 2020-09-11 DIAGNOSIS — I639 Cerebral infarction, unspecified: Secondary | ICD-10-CM | POA: Diagnosis not present

## 2020-09-11 DIAGNOSIS — I129 Hypertensive chronic kidney disease with stage 1 through stage 4 chronic kidney disease, or unspecified chronic kidney disease: Secondary | ICD-10-CM | POA: Diagnosis not present

## 2020-09-11 DIAGNOSIS — N184 Chronic kidney disease, stage 4 (severe): Secondary | ICD-10-CM | POA: Diagnosis not present

## 2020-12-27 ENCOUNTER — Ambulatory Visit: Payer: Medicaid Other | Admitting: Critical Care Medicine

## 2021-01-07 NOTE — Progress Notes (Signed)
Subjective:    Patient ID: George [name], male    DOB: 01/09/1986, 35 y.o.   MRN: [id number] History of Present Illness: This is a 35 year old male has had previous history of cerebral infarction, type 1 diabetes, hypertension, tobacco use, supraventricular tachycardia, chronic kidney disease.  Since the last visit the patient has improved somewhat.  Labs did come back showing normal thyroid function and cholesterol at goal.  The patient's blood sugars have been reasonably well-controlled running fasting levels in the 1 20-1 60 range.  Note today the CBG is 180 blood pressure has come down but still not yet at goal today 160/112  The patient has yet to see speech pathology for his aphasia.  The barrier there was the patient did not have insurance and I indicated to the scheduler the patient can be self-pay  The patient has been able to reduce tobacco use down to 3 to 5 cigarettes daily and has yet to pick up the nicotine lozenges   09/19/2019 This is a follow-up visit for this 35 year old male with type 1 diabetes, hypertension, tobacco use, chronic kidney disease, and previous history of cerebral infarction.  The patient states apart from some fatigue he is doing fairly well.  He is now smoking 2 packs of cigarettes a week.  His blood pressure at home ranges been 130-135/80-85 range.  His blood sugars have been in the 1 20-1 50s with average is 1 30-1 40 on his meter.  His sugar will go down as low as 60s before dinner.  The patient does have history of receptive a aphasia and notes when he is playing his video games he will remember some names but not all the names of the characters in the game.  He has difficulty with memory retrieval.  He saw speech pathology 1 time and has not yet made a follow-up visit.    Patient did see nephrology and has a follow-up visit pending.  At the last visit the patient not receive any labs.  The patient denies any chest pain or shortness of  breath.  12/21: Since the last visit the patient was hospitalized 4 days after our visit in September.  The patient had diabetic ketoacidosis and also new areas of stroke.  Below is the discharge summary from that hospitalization  George [name] a 35 y.o.malewith medical history significant oftype 1 diabetes, hypertension, recent CVA in June with combined receptive and expressive aphasia, CKD stage III who presented here with altered mental status and reportedly has been having nausea and persistent vomiting since Wednesday. Reportedly from EDP patient was initially able to give some history but acutely became more agitated and was pulling out his IV. He was tachycardic up to 160s and had improvement of heart rate down to 120s with 10 mg of verapamil. He was also tachypneic but remained on room air. Patient was restless and agitated at the time my evaluation and per nursing report he had attempted to use his foot to remove his hand restraints. He repeatedly stated "please help me," but was unable to explain any of his symptoms to me. He was pan positive and replied yes to every review of systems questions. Also stating he has pain everywhere. When asked for his full name and where he was he only stated "[place]."  He was afebrile and hypertensive up to 170s/108. In sinus tachycardia up to 160 and mildly tachypneic on room air. CBC showed leukocytosis of 29.6 and no anemia. CMP showed glucose level 532 with  bicarb of 18 and anion gap of 24. pH was mildly alkalotic at 7.59. Lactic acid of 5 and then 3.2 following IV fluids. TSH normal. COVID testing negative. CT head shows 2 or 3 rounded oval regions of low attenuation in the cerebral hemisphere not seen on previous studies. Suggest could be small interval lacunar infarcts with the possibility of embolic disease. Findings were age-indeterminate. CT abdomen showed no acute findings. There is gallbladder sludge or stones but no findings  of acute cholecystitis.  He was seen for nausea, vomiting, and altered mental status.  He was found to be in DKA.  He improved with insulin gtt and was transitioned to his home regimen.  He had head imaging and was found to have new strokes.  He was seen by neurology who recommended DAPT x 21 days then plavix alone.  Hospitalization also c/b AKI on CKD improving at time of discharge.  Hospital Course:  Diabetic ketoacidosis  T1DM Hypoglycemia - Unclear precipitating cause.  - He's had borderline temps, tmax of 100.2 since admission, but clinically appears to be improving and UA and CXR not suggestive of infection - follow blood cx (gram positive rods in 1/2 blood cx - repeat cx from 9/27 ngtd)  and follow urine cx (urine did have WBC, but negative LE/nitrites - no growth) - hold off on abx for now - DKA now resolved, AG 13, bicarb 22, BG173 - resumehome insulin at reduced dose 8units NPH with 3 units at meals and SSI(he's had some hypoglycemia here and he notes some at home as well - discussed importance of f/u with Dr. Joya Gaskins and monitoring BG at home) - Diabetic coordinator - A1c is 7.3 - check HbA1C  Altered mental status/delirium -delirium precautions - utox positive for MJ -resolved  Acute Infarct R Parietal Cortex  Subacute Infarct R Cerebellum  Hx CVA: -Hx L PCA infarct in June - Was on ASA/plavix and supposed to stop plavix and continue ASA alone after 9/19 (admitting provider started on plavix- have added aspirin, will deferfinal planto neuro) - Thought 2/2 cardioembolic source -> they'd planned for 30 day event monitor as echo and TEE were negative for PFO or idenifiable cardiac source of embolus and hypercoagulable/vasculitis w/u was negative per recent neuro note - will reconsult neurology in setting of acute stroke, appreciate recs- recurrent cryptogenic strokes - recommend ASA and plavix x3 weeks then plavix alone.  Of note, discharged with 81 mg ASA with  plavix, but on review of neuro note - intended to be 325 mg aspirin with plavix x21 days then plavix alone.  Called mother to correct this after discharge - she expressed understanding.  Hypertension  Tachycardia Significantly elevated BP's this AM His clonidine and coreg were being held -> which likely contributed to rebound hypertension and tachycardia Resume clonidine and coreg and amlodipine - BP still significantly elevated - will add hydralazine at d/c to help gradually normalize BP after stroke - needs close follow up with Dr. Joya Gaskins EKG with sinus tachycardia  Acute kidney injury -Creatinine of 4.11 on admission - improved to 3.02 today - good UOP - baseline creatinine ~2.2-2.4 - renal US without hydro, distended bladder noted(per nursing 9/26, bladder scan showed around 500 cc, then he voided as much) - creatinine improving - follow with nephrology as outpatient -Continue to monitor serial BMP -Avoidnephrotoxic agent  Leukocytosis: improving, follow, follow cx- given sludge vs stone in gallbladder on imaging, will follow RUQ [place] as he did have N/V on presentation - RUQ  wnl  Gram Positive Rods in 1/2 Cx: suspect contaminant, improving off abx - follow repeat cx (NGTD)  In their interim the patient's had no additional episodes of fever or DKA.  His glycemic control has been such that he occasionally runs in the 70 range early in the morning but will rebound over 200 late in the day.  It is been difficult to control with the use of long-acting NPH twice daily 8 units in the morning 12 units in the evening at meals and then associate that with short acting Humalog  The patient has since followed up with cardiology with an event monitor showing no evidence of arrhythmia therefore no evidence for embolic event.  Neurology has seen the patient and recommended switch from atorvastatin to Crestor discontinuance of a full dose aspirin after 21 days and continuing Plavix for  life  Blood pressure control continue with amlodipine clonidine and Coreg The patient also has follow-up with [facility] scheduled creatinine is in the 3 range Also an ophthalmology visit was obtained and there is apparently blood in the eyes with diabetic retinopathy  The patient does need a retinal eye referral  1/25  Since the last visit in December the patient required another admission for recurrent stroke in the cerebellar area.  The patient had ongoing nausea and went back into diabetic ketoacidosis as well.  Below is a copy of the discharge summary.  There is a follow-up appointment with nephrology upcoming and the endocrinology appointment but out until September therefore we need to intervene on that to get an earlier appointment  Also neurology appointment is pending from the hospitalization  Below is a copy of the discharge summary  Note this is a transition of care visit and below the discharge summary is the nurse case manager note    d/c summar y: Date of Admission: 01/11/2020                      Date of Discharge: 01/13/20  Admitting Physician: Lyndee Hensen, DO  Primary Care Provider: Elsie Stain, MD Consultants: Neurology   Indication for Hospitalization: DKA  Discharge Diagnoses/Problem List:  DKA in T1DM Fever Hematemesis Non-intractable nausea and vomiting Subacute CVA Acute on chronic CKD stage IV Anemia GERD  Disposition: Home   Discharge Condition: Improved   Discharge Exam:   BMW:UXLKG and conversational, in no acute distress CV: regular rate and rhythm, no murmurs appreciated RESP: no increased work of breathing, clear to ascultation bilaterally with no crackles, wheezes, or rhonchi  ABD: Bowel sounds present. Soft, Nontender, Nondistended.  MSK: no edema, or cyanosis noted SKIN: warm, dry NEURO: grossly normal, moves all extremities appropriately, alert and oriented PSYCH:Normal affect and thought content  Brief  Hospital Course:   Type 1 diabetes  nausea vomiting  DKA  hematemesis Trip Cavanagh is a 35 y.o. male presented after 3 days of vomiting that progressed to bloody emesis which prompted patient to seek care.  Mom reported patient had CBGs ranging 120-320.  On arrival, patient tachycardic (HR120s) and hypertensive (SBP 170s).  Labs concerning for mild DKA with BHB 3.73,CBG300,bicarb17, pH 7.4with elevated anion gap of 24.  He was given 2 L bolus in the ED. DKA protocol initiated and patient started on insulin drip and IV potassium.    Patient was transitioned to SQ insulin on 01/12/20.  Etiology of DKA unclear however patient did have of subacute stroke on CT head which could have been the precipitating factor for his  DKA. Overall, Pt's diabetes is controlled as A1c was 7.2.  Mom and patient wanted to start the hospital regimen of Lantus and NovoLog in the outpatient setting.  Patient was discharged with 15 Lantus and 3 units (Novolog) mealtime coverage.  Patient has a pending referral for [facility].   Acute encephalopathy   subacute CVA Kaipo had inappropriate answers to orientation questions (What year is it "8th",Whats your name "august 12th",Where are you " working on it") thus CT head was obtained.  CT head indicated 2 cm subacute infarct in the cerebellum that was not present on previous imaging.  Patient with history of 2 ischemic strokes in the past 6 months.  Neurology was consulted and recommended dual antiplatelet therapy for the next 3 weeks followed by monotherapy with Plavix thereafter.  MRA head and neck obtained did not show any emergent findings of flow limiting stenosis.  Patient to follow-up with stroke clinic at [facility] neurology patient in 4 weeks.    Hematemesis Patient with vomiting that progressed to blood-tinged emesis over the past several days.  Suspect small Mallory-Weiss tear due to frequent retching.  For GI prophylaxis, Protonix infusion  initiated in the ED.   Hematemesis resolved on day 1 of admission.  Hemoglobin remained reassuring throughout his admission. He was transitioned to oral Protonix prior to discharge.   Hypertension Due to persistent vomiting, patient was unable to take antihypertensives.  On arrival SBP's were in the 170s.  Permissive hypertension was allowed for 72 hours for subacute stroke.    Acute on chronic CKD stage IV Creatinine on admission 4.24, likely prerenal in the setting of dehydration due to vomiting.  Creatinine returned to baseline prior to discharge.  Patient to follow up outpatient with [facility].  Fever Patient had isolated fever of 100.5 F on 01/12/20.  Leukocytosis (WBC 25.4) likely due to leukemoid reaction secondary to DKA or hemoconcentration secondary to dehydration.  Blood cultures have no growth to date and urine cultures had insignificant growth.  Lactic acid was reassuring at 0.7.  No empiric antibiotics were started.   Issues for Follow Up:  1. Continue titrating patient's basal (Lantis) and short-acting insulin (Novolog).  2. Patient with permissive hypertension for the next few days.  We discontinued Clonidine. If needed, consider clonidine patch instead tablets. He should resume all other antihypertensive medications. 3. UDS was positive for THC.  Continue talking with the patient about the risk of THC use and stroke.  TCM note from CM RN: Note  Transition Care Management Follow-up Telephone Call  Call completed with patient's mother, Harriett.  DPR on file to speak with her.    Date of discharge and from where: 01/13/2020, [facility]   How have you been since you were released from the hospital? His mother reported that he is tired but doing well. Nausea has improved. She said that pharmacist recommended dramamine for the nausea and that has helped him and he is starting to eat again.   Any questions or concerns? No questions/ concerns at this time    Items Reviewed:  Did the pt receive and understand the discharge instructions provided? She has the instructions and has no questions.   Medications obtained and verified? yes, he has all medications including the new meds.  His mother is aware of the medications that have been discontinued.  She said that Saturday, 01/14/2020 he was " wiped out" and he attributed that to the lantus so yesterday, he split the lantus dose and took  7.5 units in the morning and 7.5 units in the evening.  She does realize that he may just be tired as a result of the hospitalization.   Any new allergies since your discharge? none reported   Do you have support at home? Lives with his parents  Other (ie: DME, Home Health, etc) no home health ordered.   Has glucometer, his mother said that his blood sugars usually run in  the low 200s  Functional Questionnaire: (I = Independent and D = Dependent) ADL's: independent, mother helps oversee medication regime   Follow up appointments reviewed:    PCP Hospital f/u appt confirmed? Appointment scheduled with Dr Joya Gaskins 01/23/2020 @ [facility] f/u appt confirmed? Nephrology- 01/25/2020, mother will call to schedule endocrinology and neurology appts  Are transportation arrangements needed? No, parents provide transportation  If their condition worsens, is the pt aware to call  their PCP or go to the ED?yes  Was the patient provided with contact information for the PCP's office or ED? Yes. His mother has the phone number for the clinic  Was the pt encouraged to call back with questions or concerns?yes  Since discharge the patient knows he does have a nephrology appointment coming up on 27 January but is still pending endocrine and neurology.  He states his nausea has resolved.  He also has yet to achieve a retinal appointment with Dr. Zigmund Daniel.  In looking at the patient's medication regimen it appears the NPH dosing he was on failed the patient  and he will need more consistent insulin dosing.  Note since discharge the patient has been on the Lantus dosing and this appears to have caused improved control of the diabetes and he is on a 15 unit daily dosing of this and 5 units 3 times daily of NovoLog at meals    04/03/2020>>no showed.  4/20 since last OV which actually occurred in January 2021 the patient has been to endocrinology and also neurology.  He has had prior history of recurrent strokes in the cerebellar area and plan previously had been to continue Plavix 75 mg daily and aspirin 81 mg daily for a total therapy of 3 weeks and neurology then saw the patient in February:  From that visit the following recommendations were given by neurology   1. Multiple recurrent strokes:  Continue clopidogrel 75 mg daily and Crestor 20 mg daily for secondary stroke prevention. Maintain strict control of hypertension with blood pressure goal below 130/90, diabetes with hemoglobin A1c goal below 6.5% and cholesterol with LDL cholesterol (bad cholesterol) goal below 70 mg/dL.  I also advised the patient to eat a healthy diet with plenty of whole grains, cereals, fruits and vegetables, exercise regularly with at least 30 minutes of continuous activity daily and maintain ideal body weight. 2. HTN: Advised to continue current treatment regimen.  Encouraged to monitor blood pressure at home routinely to ensure satisfactory management.  Advised him to follow-up with cardiology or PCP for ongoing management and monitoring 3. HLD: Continue Crestor 20 mg daily and ongoing monitoring management by PCP 4. DMI: Advised to continue to monitor glucose levels at home along with continued follow-up with endocrinology for management and monitoring 5. CKD stage III: Continue to follow nephrology as scheduled 6. Visual impairment: post versus diabetic retinopathy.  Highly encouraged ensuring evaluation by retina specialist for further evaluation 7. Tobacco/THC use:  Discussion with patient regarding importance of cessation due to increased risk of reoccurring stroke and cardiac disease with ongoing  use.  He verbalized understanding.  Note the patient does have a retinal specialist visit scheduled in May.  Patient also has follow-up with renal in May along with endocrinology as well.  Patient notes his blood pressures at home in the 130 over 90s range but here in the office remain elevated.  His blood glucoses often are as low as 93-100 but after eating and her first thing in the morning they can run as high as 380.  He appears to be skipping his evening doses of NovoLog.  He is concerned about with his gastroparesis he may become low but his low sugars only been in the low 80s.  The patient continues to smoke a cigarette air or 2 every 2 days.  He knows he needs to stop smoking completely  At the February endocrine visit the following recommendations were given   I explained to the patient that diabetic patients are at higher than normal risk for amputations.  His A1c is falsely skewed downwards due to CKD  I have praised the patient on frequent glucose checks and having that data available to me today, and review of his meter download he is noted to have fluctuating BG's ranging from severe hyperglycemia to hypoglycemia, patient admits to dietary indiscretions, he also tends to snack sometimes without a prandial coverage patient also tends to bolus after a meal, patient also tends to get sometimes on the correction dose, we discussed the risk of hypoglycemia especially in the setting of CKD 4.   I have recommended that he takes 2 units of NovoLog with snacks, I have explained to him that if he eats anything with carbohydrates and no prandial coverage he will have hypoglycemia.  I will also give him a correction scale so he does not guess on a correction dose.  I have recommended the following adjustments to his regimen,   MEDICATIONS: Continue lantus 17  units daily Novolog 5 units with each meal NovoLog 2 units with a snack CF: NovoLog (BG-130/55)   EDUCATION / INSTRUCTIONS:  BG monitoring instructions: Patient is instructed to check his blood sugars 4 times a day, before meals and bedtime.  Call [facility] if: BG persistently < 70 or > 300.  I reviewed the Rule of 15 for the treatment of hypoglycemia in detail with the patient. Literature supplied.   2) Diabetic complications:   Eye: Does  have known diabetic retinopathy.   Neuro/ Feet: Does night have known diabetic peripheral neuropathy.  Renal: Patient does  have known baseline CKD. He is not on an ACEI/ARB at present.  3) Lipids: Patient is on a statin.    4) Hypertension: He is above goal of < 140/90 mmHg.  Will defer further management to his PCP   Note today his CBG is 144 today 144 Note the patient did receive his Covid vaccine series with Pfizer ending April 13  07/25/2020 Since the last visit the patient has been doing fairly well.  His glycemic control is improved he has seen renal and his renal function remains stage IV CKD with GFR in the low 20s.  He is still smoking an occasional 1 cigarette periodically when he is with his girlfriend.  He has received 3 injections in the to treat diabetic retinopathy at [facility].  His vision has improved with these treatments.  Hemoglobin A1c last checked in May was 7.3.  Blood pressure control has improved as well at this time  01/08/21 Patient returns today in follow-up  last visit was in July 2021. The patient's blood pressure on arrival was elevated but on recheck 127/77.  Patient continues to have a early hypoglycemias in the evening he dropped to the 114 range.  On arrival today hemoglobin A1c is 7.0.  Blood sugars 174 on arrival.  Patient has upcoming visit with endocrinology.  He is interested now in getting a continuous glucose monitor whereas he was not before.  He states his vision is better  with retinopathy treatments.  He has been referred to renal [facility] clinic at [facility] at this time.  He is no longer smoking cigarettes for the past 6 months.  Patient is fully vaccinated for COVID   Past Medical History:  Diagnosis Date  . AKI (acute kidney injury) ([place])   . Cerebral infarction ([place])   . Hypertension   . Neurologic deficit due to acute ischemic cerebrovascular accident (CVA) ([place]) 06/13/2019  . Supraventricular tachycardia ([place])   . Type 1 diabetes ([facility])      Family History  Problem Relation Age of Onset  . Hypertension Mother   . Diabetes Mother   . Hyperlipidemia Mother   . Diabetes Father      Social History   Socioeconomic History  . Marital status: Single    Spouse name: Not on file  . Number of children: Not on file  . Years of education: Not on file  . Highest education level: Not on file  Occupational History  . Not on file  Tobacco Use  . Smoking status: Former Smoker    Packs/day: 0.25    Types: Cigarettes    Start date: 12/29/2010    Quit date: 06/25/2020    Years since quitting: 0.5  . Smokeless tobacco: Never Used  Vaping Use  . Vaping Use: Former  . Start date: 12/30/2015  . Quit date: 12/29/2017  Substance and Sexual Activity  . Alcohol use: Not on file    Comment: 6 beers/week  . Drug use: Not Currently    Types: Marijuana  . Sexual activity: Not Currently  Other Topics Concern  . Not on file  Social History Narrative  . Not on file   Social Determinants of Health   Financial Resource Strain: Not on file  Food Insecurity: Not on file  Transportation Needs: Not on file  Physical Activity: Not on file  Stress: Not on file  Social Connections: Not on file  Intimate Partner Violence: Not on file     No Known Allergies   Outpatient Medications Prior to Visit  Medication Sig Dispense Refill  . amLODipine (NORVASC) 10 MG tablet Take 1 tablet (10 mg total) by mouth daily. 90 tablet 2  . carvedilol (COREG) 25 MG  tablet Take 1 tablet (25 mg total) by mouth 2 (two) times daily with a meal. 180 tablet 1  . clopidogrel (PLAVIX) 75 MG tablet Take 1 tablet (75 mg total) by mouth daily. 90 tablet 3  . hydrALAZINE (APRESOLINE) 100 MG tablet Take 1 tablet (100 mg total) by mouth 3 (three) times daily. 90 tablet 4  . insulin aspart (NOVOLOG FLEXPEN) 100 UNIT/ML FlexPen Inject 5 Units into the skin 3 (three) times daily with meals. Mac daily 30 units to include correction scale and snack coverage 15 mL 3  . insulin glargine (LANTUS SOLOSTAR) 100 UNIT/ML Solostar Pen Inject 18 Units into the skin daily. 15 mL 11  . pantoprazole (PROTONIX) 40 MG tablet Take 1 tablet (40 mg total) by mouth daily. For stomach  protection 30 tablet 11  . rosuvastatin (CRESTOR) 20 MG tablet Take 1 tablet (20 mg total) by mouth daily. 30 tablet 4  . glucose blood (ONETOUCH VERIO) test strip Use as instructed to check blood sugar 3 times daily. 100 each 11  . Insulin Pen Needle (PEN NEEDLES 3/16") 31G X 5 MM MISC To inject insulin with novolog flex pen, 3 times daily with meals. 100 each 4   No facility-administered medications prior to visit.    Review of Systems  Constitutional: Negative for activity change, appetite change, chills, diaphoresis, fatigue, fever and unexpected weight change.  HENT: Negative.   Eyes: Negative for visual disturbance.  Respiratory: Negative.   Cardiovascular: Negative.  Negative for palpitations.  Gastrointestinal: Negative for constipation.  Endocrine: Negative.   Genitourinary: Negative.   Musculoskeletal: Negative.   Neurological: Negative for dizziness, tremors, seizures, syncope, facial asymmetry, speech difficulty, light-headedness, numbness and headaches.  Hematological: Negative for adenopathy. Does not bruise/bleed easily.  Psychiatric/Behavioral: Negative.        Objective:   Physical Exam Vitals:   01/08/21 1352 01/08/21 1416  BP: (!) 159/85 127/77  Pulse: (!) 102 75  Resp: 16    Temp: 97.9 F (36.6 C)   SpO2: 98% 98%  Weight: 117 lb (53.1 kg)    Gen: Pleasant, well-nourished, in no distress,  normal affect  ENT: No lesions,  mouth clear,  oropharynx clear, no postnasal drip, poor dentition  Neck: No JVD, no TMG, no carotid bruits  Lungs: No use of accessory muscles, no dullness to percussion, clear without rales or rhonchi  Cardiovascular: RRR, heart sounds normal, no murmur or gallops, no peripheral edema  Abdomen: soft and NT, no HSM,  BS normal  Musculoskeletal: No deformities, no cyanosis or clubbing  Neuro: alert, non focal  Skin: Warm, no lesions or rashes  foot exam NORMAL  BMP Latest Ref Rng & Units 04/17/2020 01/23/2020 01/13/2020  Glucose 65 - 99 mg/dL 77 114(H) 184(H)  BUN 6 - 20 mg/dL 26(H) 35(H) 24(H)  Creatinine 0.76 - 1.27 mg/dL 3.17(H) 3.25(H) 2.83(H)  BUN/Creat Ratio 9 - 20 8(L) 11 -  Sodium 134 - 144 mmol/L 142 142 137  Potassium 3.5 - 5.2 mmol/L 4.6 4.9 4.0  Chloride 96 - 106 mmol/L 103 103 109  CO2 20 - 29 mmol/L _0 Calcium 8.7 - 10.2 mg/dL 10.0 9.6 8.2(L)   CBC Latest Ref Rng & Units 04/17/2020 01/23/2020 01/11/2020  WBC 3.4 - 10.8 x10E3/uL 15.2(H) 11.7(H) 25.4(H)  Hemoglobin 13.0 - 17.7 g/dL 12.7(L) 14.5 11.8(L)  Hematocrit 37.5 - 51.0 % 37.3(L) 43.4 34.5(L)  Platelets 150 - 450 x10E3/uL 222 348 203       Assessment & Plan:  I personally reviewed all images and lab data in the [facility] system as well as any outside material available during this office visit and agree with the  radiology impressions.   HTN (hypertension) Hypertension under good control and I am avoiding ARB and ACE inhibitor's.  No change in blood pressure medications for now  Hyperlipidemia due to type 1 diabetes mellitus ([place]) Hyperlipidemia due to type 1 diabetes Continue cholesterol therapy as prescribed  Proliferative diabetic retinopathy associated with type 1 diabetes mellitus (HCC) Proliferative diabetic retinopathy due to type 1 diabetes  improved with therapy follow-up with ophthalmology  Type 1 diabetes mellitus with hyperglycemia ([place]) Type 1 diabetes with hyperglycemia renal and retinopathy complication  Plan per endocrinology  Will arrange for continuous glucose monitor  No change in insulin  dosing   Type 1 diabetes mellitus with stage 4 chronic kidney disease ([place]) Patient currently under evaluation for renal transplant  Continue to optimize diabetic care, he has been resistant to receiving an insulin pump in the past  Former tobacco use Patient is no longer smoking for the past 6 months   Diagnoses and all orders for this visit:  Type 1 diabetes mellitus with stage 3 chronic kidney disease, unspecified whether stage 3a or 3b CKD ([place]) -     Glucose (CBG) -     HgB A1c  Long term current use of aspirin -     pantoprazole (PROTONIX) 40 MG tablet; Take 1 tablet (40 mg total) by mouth daily. For stomach protection  Essential hypertension -     amLODipine (NORVASC) 10 MG tablet; Take 1 tablet (10 mg total) by mouth daily.  Need for immunization against influenza -     Flu Vaccine QUAD 36+ mos IM  Primary hypertension  Hyperlipidemia due to type 1 diabetes mellitus ([place])  Proliferative diabetic retinopathy associated with type 1 diabetes mellitus, unspecified laterality, unspecified proliferative retinopathy type ([place])  Type 1 diabetes mellitus with hyperglycemia (HCC)  Type 1 diabetes mellitus with stage 4 chronic kidney disease ([place])  Former tobacco use  Other orders -     clopidogrel (PLAVIX) 75 MG tablet; Take 1 tablet (75 mg total) by mouth daily. -     insulin aspart (NOVOLOG FLEXPEN) 100 UNIT/ML FlexPen; Inject 5 Units into the skin 3 (three) times daily with meals. Mac daily 30 units to include correction scale and snack coverage -     carvedilol (COREG) 25 MG tablet; Take 1 tablet (25 mg total) by mouth 2 (two) times daily with a meal. -     rosuvastatin (CRESTOR) 20 MG tablet; Take 1  tablet (20 mg total) by mouth daily. -     insulin glargine (LANTUS SOLOSTAR) 100 UNIT/ML Solostar Pen; Inject 18 Units into the skin daily. -     hydrALAZINE (APRESOLINE) 100 MG tablet; Take 1 tablet (100 mg total) by mouth 3 (three) times daily. -     Insulin Pen Needle (PEN NEEDLES 3/16") 31G X 5 MM MISC; To inject insulin with novolog flex pen, 3 times daily with meals. -     glucose blood (ONETOUCH VERIO) test strip; Use as instructed to check blood sugar 3 times daily. -     Continuous Blood Gluc Receiver ([place]) DEVI; Use as instructed to check blood sugar 3 times daily. -     Continuous Blood Gluc Sensor (DEXCOM G6 SENSOR) MISC; Use as instructed to check blood sugar 3 times daily. -     Discontinue: Continuous Blood Gluc Transmit (DEXCOM G6 TRANSMITTER) MISC; Use as instructed to check blood sugar 3 times daily.    Flu vaccine given at this visit

## 2021-01-08 ENCOUNTER — Encounter: Payer: Self-pay | Admitting: Critical Care Medicine

## 2021-01-08 ENCOUNTER — Ambulatory Visit: Payer: Medicaid Other | Attending: Critical Care Medicine | Admitting: Critical Care Medicine

## 2021-01-08 ENCOUNTER — Other Ambulatory Visit: Payer: Self-pay | Admitting: Internal Medicine

## 2021-01-08 ENCOUNTER — Other Ambulatory Visit: Payer: Self-pay

## 2021-01-08 VITALS — BP 127/77 | HR 75 | Temp 97.9°F | Resp 16 | Wt 117.0 lb

## 2021-01-08 DIAGNOSIS — E1065 Type 1 diabetes mellitus with hyperglycemia: Secondary | ICD-10-CM

## 2021-01-08 DIAGNOSIS — E785 Hyperlipidemia, unspecified: Secondary | ICD-10-CM

## 2021-01-08 DIAGNOSIS — Z23 Encounter for immunization: Secondary | ICD-10-CM | POA: Diagnosis not present

## 2021-01-08 DIAGNOSIS — E103599 Type 1 diabetes mellitus with proliferative diabetic retinopathy without macular edema, unspecified eye: Secondary | ICD-10-CM | POA: Diagnosis not present

## 2021-01-08 DIAGNOSIS — Z87891 Personal history of nicotine dependence: Secondary | ICD-10-CM | POA: Diagnosis not present

## 2021-01-08 DIAGNOSIS — N183 Chronic kidney disease, stage 3 unspecified: Secondary | ICD-10-CM | POA: Diagnosis not present

## 2021-01-08 DIAGNOSIS — E1069 Type 1 diabetes mellitus with other specified complication: Secondary | ICD-10-CM | POA: Diagnosis not present

## 2021-01-08 DIAGNOSIS — E1022 Type 1 diabetes mellitus with diabetic chronic kidney disease: Secondary | ICD-10-CM | POA: Diagnosis not present

## 2021-01-08 DIAGNOSIS — N184 Chronic kidney disease, stage 4 (severe): Secondary | ICD-10-CM | POA: Diagnosis not present

## 2021-01-08 DIAGNOSIS — I10 Essential (primary) hypertension: Secondary | ICD-10-CM | POA: Diagnosis not present

## 2021-01-08 DIAGNOSIS — Z7982 Long term (current) use of aspirin: Secondary | ICD-10-CM | POA: Diagnosis not present

## 2021-01-08 LAB — POCT GLYCOSYLATED HEMOGLOBIN (HGB A1C): HbA1c, POC (controlled diabetic range): 7 % (ref 0.0–7.0)

## 2021-01-08 LAB — GLUCOSE, POCT (MANUAL RESULT ENTRY): POC Glucose: 174 mg/dl — AB (ref 70–99)

## 2021-01-08 MED ORDER — "PEN NEEDLES 3/16"" 31G X 5 MM MISC": 4 refills | Status: DC

## 2021-01-08 MED ORDER — HYDRALAZINE HCL 100 MG PO TABS: 100.0000 mg | ORAL_TABLET | Freq: Three times a day (TID) | ORAL | 4 refills | Status: DC

## 2021-01-08 MED ORDER — ONETOUCH VERIO VI STRP: ORAL_STRIP | 11 refills | Status: AC

## 2021-01-08 MED ORDER — LANTUS SOLOSTAR 100 UNIT/ML ~~LOC~~ SOPN: 18.0000 [IU] | PEN_INJECTOR | Freq: Every day | SUBCUTANEOUS | 11 refills | Status: DC

## 2021-01-08 MED ORDER — DEXCOM G6 SENSOR MISC: 2 refills | Status: DC

## 2021-01-08 MED ORDER — CARVEDILOL 25 MG PO TABS: 25.0000 mg | ORAL_TABLET | Freq: Two times a day (BID) | ORAL | 1 refills | Status: DC

## 2021-01-08 MED ORDER — ROSUVASTATIN CALCIUM 20 MG PO TABS: 20.0000 mg | ORAL_TABLET | Freq: Every day | ORAL | 4 refills | Status: DC

## 2021-01-08 MED ORDER — DEXCOM G6 TRANSMITTER MISC: 2 refills | Status: DC

## 2021-01-08 MED ORDER — AMLODIPINE BESYLATE 10 MG PO TABS: 10.0000 mg | ORAL_TABLET | Freq: Every day | ORAL | 2 refills | Status: DC

## 2021-01-08 MED ORDER — DEXCOM G6 SENSOR MISC: 1.0000 | 11 refills | Status: DC

## 2021-01-08 MED ORDER — NOVOLOG FLEXPEN 100 UNIT/ML ~~LOC~~ SOPN: 5.0000 [IU] | PEN_INJECTOR | Freq: Three times a day (TID) | SUBCUTANEOUS | 3 refills | Status: DC

## 2021-01-08 MED ORDER — PANTOPRAZOLE SODIUM 40 MG PO TBEC: 40.0000 mg | DELAYED_RELEASE_TABLET | Freq: Every day | ORAL | 11 refills | Status: DC

## 2021-01-08 MED ORDER — DEXCOM G6 TRANSMITTER MISC: 1.0000 | 3 refills | Status: DC

## 2021-01-08 MED ORDER — DEXCOM G6 RECEIVER DEVI: 0 refills | Status: DC

## 2021-01-08 MED ORDER — CLOPIDOGREL BISULFATE 75 MG PO TABS: 75.0000 mg | ORAL_TABLET | Freq: Every day | ORAL | 3 refills | Status: DC

## 2021-01-08 NOTE — Assessment & Plan Note (Signed)
Hyperlipidemia due to type 1 diabetes Continue cholesterol therapy as prescribed

## 2021-01-08 NOTE — Assessment & Plan Note (Signed)
Patient is no longer smoking for the past 6 months

## 2021-01-08 NOTE — Assessment & Plan Note (Signed)
Patient currently under evaluation for renal transplant  Continue to optimize diabetic care, he has been resistant to receiving an insulin pump in the past

## 2021-01-08 NOTE — Progress Notes (Signed)
Dexcom ordered

## 2021-01-08 NOTE — Assessment & Plan Note (Signed)
Type 1 diabetes with hyperglycemia renal and retinopathy complication  Plan per endocrinology  Will arrange for continuous glucose monitor  No change in insulin dosing

## 2021-01-08 NOTE — Assessment & Plan Note (Signed)
Proliferative diabetic retinopathy due to type 1 diabetes improved with therapy follow-up with ophthalmology

## 2021-01-08 NOTE — Assessment & Plan Note (Signed)
Hypertension under good control and I am avoiding ARB and ACE inhibitor's.  No change in blood pressure medications for now

## 2021-01-08 NOTE — Progress Notes (Signed)
Follow OV Discuss DM

## 2021-01-08 NOTE — Patient Instructions (Signed)
We will send a prescription for a continuous glucose monitoring device to your [facility] they will run a prior authorization through Medicaid we will assist with any issues on the  The instructions are pretty self-explanatory however you can make an appointment with [place] clinical pharmacist and go over this with you if you need to be recommending the libre freestyle  Flu vaccine was given  Keep your appointments with endocrinology  No change in your other medications  Return to see Dr. Joya Gaskins again in 4 months

## 2021-01-16 ENCOUNTER — Ambulatory Visit: Payer: Medicaid Other | Admitting: Adult Health

## 2021-01-16 DIAGNOSIS — N184 Chronic kidney disease, stage 4 (severe): Secondary | ICD-10-CM | POA: Diagnosis not present

## 2021-01-24 DIAGNOSIS — I129 Hypertensive chronic kidney disease with stage 1 through stage 4 chronic kidney disease, or unspecified chronic kidney disease: Secondary | ICD-10-CM | POA: Diagnosis not present

## 2021-01-24 DIAGNOSIS — N2581 Secondary hyperparathyroidism of renal origin: Secondary | ICD-10-CM | POA: Diagnosis not present

## 2021-01-24 DIAGNOSIS — N184 Chronic kidney disease, stage 4 (severe): Secondary | ICD-10-CM | POA: Diagnosis not present

## 2021-01-24 DIAGNOSIS — I639 Cerebral infarction, unspecified: Secondary | ICD-10-CM | POA: Diagnosis not present

## 2021-01-24 DIAGNOSIS — D631 Anemia in chronic kidney disease: Secondary | ICD-10-CM | POA: Diagnosis not present

## 2021-02-27 DIAGNOSIS — N184 Chronic kidney disease, stage 4 (severe): Secondary | ICD-10-CM | POA: Diagnosis not present

## 2021-03-04 DIAGNOSIS — D631 Anemia in chronic kidney disease: Secondary | ICD-10-CM | POA: Diagnosis not present

## 2021-03-04 DIAGNOSIS — I120 Hypertensive chronic kidney disease with stage 5 chronic kidney disease or end stage renal disease: Secondary | ICD-10-CM | POA: Diagnosis not present

## 2021-03-04 DIAGNOSIS — E1022 Type 1 diabetes mellitus with diabetic chronic kidney disease: Secondary | ICD-10-CM | POA: Diagnosis not present

## 2021-03-04 DIAGNOSIS — N2581 Secondary hyperparathyroidism of renal origin: Secondary | ICD-10-CM | POA: Diagnosis not present

## 2021-03-04 DIAGNOSIS — N185 Chronic kidney disease, stage 5: Secondary | ICD-10-CM | POA: Diagnosis not present

## 2021-03-04 DIAGNOSIS — I639 Cerebral infarction, unspecified: Secondary | ICD-10-CM | POA: Diagnosis not present

## 2021-03-04 DIAGNOSIS — E872 Acidosis: Secondary | ICD-10-CM | POA: Diagnosis not present

## 2021-03-07 ENCOUNTER — Ambulatory Visit: Payer: Medicaid Other | Admitting: Internal Medicine

## 2021-03-08 ENCOUNTER — Other Ambulatory Visit: Payer: Self-pay | Admitting: *Deleted

## 2021-03-08 DIAGNOSIS — N186 End stage renal disease: Secondary | ICD-10-CM

## 2021-03-12 ENCOUNTER — Ambulatory Visit (HOSPITAL_COMMUNITY)
Admission: RE | Admit: 2021-03-12 | Discharge: 2021-03-12 | Disposition: A | Discharge status: Home / Self Care | Payer: Medicaid Other | Source: Ambulatory Visit | Attending: Vascular Surgery | Admitting: Vascular Surgery

## 2021-03-12 ENCOUNTER — Encounter: Payer: Self-pay | Admitting: Vascular Surgery

## 2021-03-12 ENCOUNTER — Other Ambulatory Visit: Payer: Self-pay

## 2021-03-12 ENCOUNTER — Ambulatory Visit (INDEPENDENT_AMBULATORY_CARE_PROVIDER_SITE_OTHER)
Admission: RE | Admit: 2021-03-12 | Discharge: 2021-03-12 | Disposition: A | Discharge status: Home / Self Care | Payer: Medicaid Other | Source: Ambulatory Visit | Attending: Vascular Surgery | Admitting: Vascular Surgery

## 2021-03-12 ENCOUNTER — Ambulatory Visit (INDEPENDENT_AMBULATORY_CARE_PROVIDER_SITE_OTHER): Payer: Medicaid Other | Admitting: Vascular Surgery

## 2021-03-12 VITALS — BP 151/88 | HR 88 | Temp 98.1°F | Resp 14 | Ht 63.0 in | Wt 130.0 lb

## 2021-03-12 DIAGNOSIS — N186 End stage renal disease: Secondary | ICD-10-CM

## 2021-03-12 DIAGNOSIS — N184 Chronic kidney disease, stage 4 (severe): Secondary | ICD-10-CM | POA: Diagnosis not present

## 2021-03-12 NOTE — Progress Notes (Signed)
[facility] [place]  ASSESSMENT / PLAN: George [name] is a 35 y.o. right handed male in need of permanent hemodialysis access. I reviewed options for dialysis in detail with the patient. I counseled the patient that dialysis access requires surveillance and periodic maintenance. I counseled him about the sclerotic segment of cephalic vein identified on exam today. Plan to proceed with left brachiocephalic AVF 03/15/34.    CHIEF COMPLAINT: in need of dialysis access  HISTORY OF PRESENT ILLNESS: George [name] is a 35 y.o. male with CKD IV who presents to the clinic for evaluation of dialysis access.  He is a type I diabetic who is a former smoker.  He previously had very poor control of his diabetes.  He has taken better care of himself since suffering a stroke in January 2021.  Unfortunately he has seen a deterioration in his renal function since his stroke.  He now presents for access surgery.  He is right-handed.  He has no personal history of pacemaker or central venous catheter placement.  He denies any history of trauma or surgery to the upper extremities.  Past Medical History:  Diagnosis Date  . AKI (acute kidney injury) (HCC)   . Cerebral infarction (HCC)   . Hypertension   . Neurologic deficit due to acute ischemic cerebrovascular accident (CVA) (HCC) 06/13/2019  . Supraventricular tachycardia (HCC)   . Type 1 diabetes (HCC)     Past Surgical History:  Procedure Laterality Date  . BUBBLE STUDY  06/20/2019   Procedure: BUBBLE STUDY;  Surgeon: Ross, Paula V, MD;  Location: [facility] END[facility];  Service: Cardiovascular;;  . TEE WITHOUT CARDIOVERSION N/A 06/20/2019   Procedure: TRANSESOPHAGEAL ECHOCARDIOGRAM (TEE);  Surgeon: Ross, Paula V, MD;  Location: [facility] ENDOSCOPY;  Service: Cardiovascular;  Laterality: N/A;    Family History  Problem Relation Age of Onset  . Hypertension Mother   . Diabetes Mother   . Hyperlipidemia Mother   . Diabetes Father     Social  History   Socioeconomic History  . Marital status: Single    Spouse name: Not on file  . Number of children: Not on file  . Years of education: Not on file  . Highest education level: Not on file  Occupational History  . Not on file  Tobacco Use  . Smoking status: Former Smoker    Packs/day: 0.25    Types: Cigarettes    Start date: 12/29/2010    Quit date: 06/25/2020    Years since quitting: 0.7  . Smokeless tobacco: Never Used  Vaping Use  . Vaping Use: Former  . Start date: 12/30/2015  . Quit date: 12/29/2017  Substance and Sexual Activity  . Alcohol use: Not on file    Comment: 6 beers/week  . Drug use: Not Currently    Types: Marijuana  . Sexual activity: Not Currently  Other Topics Concern  . Not on file  Social History Narrative  . Not on file   Social Determinants of Health   Financial Resource Strain: Not on file  Food Insecurity: Not on file  Transportation Needs: Not on file  Physical Activity: Not on file  Stress: Not on file  Social Connections: Not on file  Intimate Partner Violence: Not on file    No Known Allergies  Current Outpatient Medications  Medication Sig Dispense Refill  . amLODipine (NORVASC) 10 MG tablet Take 1 tablet (10 mg total) by mouth daily. 90 tablet 2  . carvedilol (COREG) 25 MG tablet Take 1   tablet (25 mg total) by mouth 2 (two) times daily with a meal. 180 tablet 1  . clopidogrel (PLAVIX) 75 MG tablet Take 1 tablet (75 mg total) by mouth daily. 90 tablet 3  . Continuous Blood Gluc Receiver (DEXCOM G6 RECEIVER) DEVI Use as instructed to check blood sugar 3 times daily. 1 each 0  . Continuous Blood Gluc Sensor (DEXCOM G6 SENSOR) MISC Use as instructed to check blood sugar 3 times daily. 3 each 2  . Continuous Blood Gluc Sensor (DEXCOM G6 SENSOR) MISC 1 Device by Does not apply route as directed. 3 each 11  . Continuous Blood Gluc Transmit (DEXCOM G6 TRANSMITTER) MISC Inject 1 Device into the skin as directed. Use as instructed to check  blood sugar 3 times daily. 1 each 3  . glucose blood (ONETOUCH VERIO) test strip Use as instructed to check blood sugar 3 times daily. 100 each 11  . hydrALAZINE (APRESOLINE) 100 MG tablet Take 1 tablet (100 mg total) by mouth 3 (three) times daily. 90 tablet 4  . insulin aspart (NOVOLOG FLEXPEN) 100 UNIT/ML FlexPen Inject 5 Units into the skin 3 (three) times daily with meals. Mac daily 30 units to include correction scale and snack coverage (Patient taking differently: Inject 5-30 Units into the skin See admin instructions. Mac daily 30 units to include correction scale and snack coverage three times a day with meals) 15 mL 3  . insulin glargine (LANTUS SOLOSTAR) 100 UNIT/ML Solostar Pen Inject 18 Units into the skin daily. 15 mL 11  . Insulin Pen Needle (PEN NEEDLES 3/16") 31G X 5 MM MISC To inject insulin with novolog flex pen, 3 times daily with meals. 100 each 4  . pantoprazole (PROTONIX) 40 MG tablet Take 1 tablet (40 mg total) by mouth daily. For stomach protection 30 tablet 11  . rosuvastatin (CRESTOR) 20 MG tablet Take 1 tablet (20 mg total) by mouth daily. 30 tablet 4  . sodium bicarbonate 650 MG tablet Take 650 mg by mouth 2 (two) times daily.    [name] 1 GM 210 MG(Fe) tablet Take 210 mg by mouth 3 (three) times daily.    . Cholecalciferol (VITAMIN D3) 50 MCG (2000 UT) TABS Take 200 Units by mouth daily.    . ferrous sulfate 325 (65 FE) MG tablet Take 325 mg by mouth daily with breakfast.     No current facility-administered medications for this visit.    REVIEW OF SYSTEMS:  _0  denotes positive finding, _1  denotes negative finding Cardiac  Comments:  Chest pain or chest pressure:    Shortness of breath upon exertion:    Short of breath when lying flat:    Irregular heart rhythm:        Vascular    Pain in calf, thigh, or hip brought on by ambulation:    Pain in feet at night that wakes you up from your sleep:     Blood clot in your veins:    Leg swelling:          Pulmonary    Oxygen at home:    Productive cough:     Wheezing:         Neurologic    Sudden weakness in arms or legs:     Sudden numbness in arms or legs:     Sudden onset of difficulty speaking or slurred speech:    Temporary loss of vision in one eye:     Problems with dizziness:  Gastrointestinal    Blood in stool:     Vomited blood:         Genitourinary    Burning when urinating:     Blood in urine:        Psychiatric    Major depression:         Hematologic    Bleeding problems:    Problems with blood clotting too easily:        Skin    Rashes or ulcers:        Constitutional    Fever or chills:      PHYSICAL EXAM  Vitals:   03/12/21 1423  BP: (!) 151/88  Pulse: 88  Resp: 14  Temp: 98.1 F (36.7 C)  TempSrc: Temporal  SpO2: 98%  Weight: 130 lb (59 kg)  Height: _0  (1.6 m)    Constitutional: well appearing. no distress. Appears well nourished.  Neurologic: CN intact. no focal findings. no sensory loss. Psychiatric: Mood and affect symmetric and appropriate. Eyes: No icterus. No conjunctival pallor. Ears, nose, throat: mucous membranes moist. Midline trachea.  Cardiac: regular rate and rhythm.  Respiratory: unlabored. Abdominal: soft, non-tender, non-distended.  Peripheral vascular:  2+ radial pulses and brachial pulses bilaterally Extremity: No edema. No cyanosis. No pallor.  Skin: No gangrene. No ulceration.  Lymphatic: No Stemmer's sign. No palpable lymphadenopathy.  PERTINENT LABORATORY AND RADIOLOGIC DATA  Most recent CBC CBC Latest Ref Rng & Units 04/17/2020 01/23/2020 01/11/2020  WBC 3.4 - 10.8 x10E3/uL 15.2(H) 11.7(H) 25.4(H)  Hemoglobin 13.0 - 17.7 g/dL 12.7(L) 14.5 11.8(L)  Hematocrit 37.5 - 51.0 % 37.3(L) 43.4 34.5(L)  Platelets 150 - 450 x10E3/uL 222 348 203     Most recent CMP CMP Latest Ref Rng & Units 04/17/2020 01/23/2020 01/13/2020  Glucose 65 - 99 mg/dL 77 114(H) 184(H)  BUN 6 - 20 mg/dL 26(H) 35(H) 24(H)   Creatinine 0.76 - 1.27 mg/dL 3.17(H) 3.25(H) 2.83(H)  Sodium 134 - 144 mmol/L 142 142 137  Potassium 3.5 - 5.2 mmol/L 4.6 4.9 4.0  Chloride 96 - 106 mmol/L 103 103 109  CO2 20 - 29 mmol/L _1 Calcium 8.7 - 10.2 mg/dL 10.0 9.6 8.2(L)  Total Protein 6.0 - 8.5 g/dL 6.7 6.7 -  Total Bilirubin 0.0 - 1.2 mg/dL 0.2 0.3 -  Alkaline Phos 39 - 117 IU/L 106 102 -  AST 0 - 40 IU/L 15 18 -  ALT 0 - 44 IU/L 15 22 -    Renal function CrCl cannot be calculated (Patient's most recent lab result is older than the maximum 21 days allowed.).  HbA1c, POC (controlled diabetic range) (%)  Date Value  01/08/2021 7.0    LDL Chol Calc (NIH)  Date Value Ref Range Status  04/17/2020 55 0 - 99 mg/dL Final        Yevonne Aline. Stanford Breed, MD [facility] Phone Number: [phone] 03/12/2021 5:37 PM

## 2021-03-12 NOTE — H&P (View-Only) (Signed)
[facility] [place]  ASSESSMENT / PLAN: George [name] is a 35 y.o. right handed male in need of permanent hemodialysis access. I reviewed options for dialysis in detail with the patient. I counseled the patient that dialysis access requires surveillance and periodic maintenance. I counseled him about the sclerotic segment of cephalic vein identified on exam today. Plan to proceed with left brachiocephalic AVF 7/62/83.    CHIEF COMPLAINT: in need of dialysis access  HISTORY OF PRESENT ILLNESS: George [name] is a 35 y.o. male with CKD IV who presents to the clinic for evaluation of dialysis access.  He is a type I diabetic who is a former smoker.  He previously had very poor control of his diabetes.  He has taken better care of himself since suffering a stroke in January 2021.  Unfortunately he has seen a deterioration in his renal function since his stroke.  He now presents for access surgery.  He is right-handed.  He has no personal history of pacemaker or central venous catheter placement.  He denies any history of trauma or surgery to the upper extremities.  Past Medical History:  Diagnosis Date  . AKI (acute kidney injury) ([place])   . Cerebral infarction ([place])   . Hypertension   . Neurologic deficit due to acute ischemic cerebrovascular accident (CVA) ([place]) 06/13/2019  . Supraventricular tachycardia ([place])   . Type 1 diabetes [facility])     Past Surgical History:  Procedure Laterality Date  . BUBBLE STUDY  06/20/2019   Procedure: BUBBLE STUDY;  Surgeon: Fay Records, MD;  Location: [place];  Service: Cardiovascular;;  . TEE WITHOUT CARDIOVERSION N/A 06/20/2019   Procedure: TRANSESOPHAGEAL ECHOCARDIOGRAM (TEE);  Surgeon: Fay Records, MD;  Location: [facility] ENDOSCOPY;  Service: Cardiovascular;  Laterality: N/A;    Family History  Problem Relation Age of Onset  . Hypertension Mother   . Diabetes Mother   . Hyperlipidemia Mother   . Diabetes Father     Social  History   Socioeconomic History  . Marital status: Single    Spouse name: Not on file  . Number of children: Not on file  . Years of education: Not on file  . Highest education level: Not on file  Occupational History  . Not on file  Tobacco Use  . Smoking status: Former Smoker    Packs/day: 0.25    Types: Cigarettes    Start date: 12/29/2010    Quit date: 06/25/2020    Years since quitting: 0.7  . Smokeless tobacco: Never Used  Vaping Use  . Vaping Use: Former  . Start date: 12/30/2015  . Quit date: 12/29/2017  Substance and Sexual Activity  . Alcohol use: Not on file    Comment: 6 beers/week  . Drug use: Not Currently    Types: Marijuana  . Sexual activity: Not Currently  Other Topics Concern  . Not on file  Social History Narrative  . Not on file   Social Determinants of Health   Financial Resource Strain: Not on file  Food Insecurity: Not on file  Transportation Needs: Not on file  Physical Activity: Not on file  Stress: Not on file  Social Connections: Not on file  Intimate Partner Violence: Not on file    No Known Allergies  Current Outpatient Medications  Medication Sig Dispense Refill  . amLODipine (NORVASC) 10 MG tablet Take 1 tablet (10 mg total) by mouth daily. 90 tablet 2  . carvedilol (COREG) 25 MG tablet Take 1  tablet (25 mg total) by mouth 2 (two) times daily with a meal. 180 tablet 1  . clopidogrel (PLAVIX) 75 MG tablet Take 1 tablet (75 mg total) by mouth daily. 90 tablet 3  . Continuous Blood Gluc Receiver (DEXCOM G6 RECEIVER) DEVI Use as instructed to check blood sugar 3 times daily. 1 each 0  . Continuous Blood Gluc Sensor (DEXCOM G6 SENSOR) MISC Use as instructed to check blood sugar 3 times daily. 3 each 2  . Continuous Blood Gluc Sensor (DEXCOM G6 SENSOR) MISC 1 Device by Does not apply route as directed. 3 each 11  . Continuous Blood Gluc Transmit (DEXCOM G6 TRANSMITTER) MISC Inject 1 Device into the skin as directed. Use as instructed to check  blood sugar 3 times daily. 1 each 3  . glucose blood (ONETOUCH VERIO) test strip Use as instructed to check blood sugar 3 times daily. 100 each 11  . hydrALAZINE (APRESOLINE) 100 MG tablet Take 1 tablet (100 mg total) by mouth 3 (three) times daily. 90 tablet 4  . insulin aspart (NOVOLOG FLEXPEN) 100 UNIT/ML FlexPen Inject 5 Units into the skin 3 (three) times daily with meals. Mac daily 30 units to include correction scale and snack coverage (Patient taking differently: Inject 5-30 Units into the skin See admin instructions. Mac daily 30 units to include correction scale and snack coverage three times a day with meals) 15 mL 3  . insulin glargine (LANTUS SOLOSTAR) 100 UNIT/ML Solostar Pen Inject 18 Units into the skin daily. 15 mL 11  . Insulin Pen Needle (PEN NEEDLES 3/16") 31G X 5 MM MISC To inject insulin with novolog flex pen, 3 times daily with meals. 100 each 4  . pantoprazole (PROTONIX) 40 MG tablet Take 1 tablet (40 mg total) by mouth daily. For stomach protection 30 tablet 11  . rosuvastatin (CRESTOR) 20 MG tablet Take 1 tablet (20 mg total) by mouth daily. 30 tablet 4  . sodium bicarbonate 650 MG tablet Take 650 mg by mouth 2 (two) times daily.    [name] 1 GM 210 MG(Fe) tablet Take 210 mg by mouth 3 (three) times daily.    . Cholecalciferol (VITAMIN D3) 50 MCG (2000 UT) TABS Take 200 Units by mouth daily.    . ferrous sulfate 325 (65 FE) MG tablet Take 325 mg by mouth daily with breakfast.     No current facility-administered medications for this visit.    REVIEW OF SYSTEMS:  _0  denotes positive finding, _1  denotes negative finding Cardiac  Comments:  Chest pain or chest pressure:    Shortness of breath upon exertion:    Short of breath when lying flat:    Irregular heart rhythm:        Vascular    Pain in calf, thigh, or hip brought on by ambulation:    Pain in feet at night that wakes you up from your sleep:     Blood clot in your veins:    Leg swelling:          Pulmonary    Oxygen at home:    Productive cough:     Wheezing:         Neurologic    Sudden weakness in arms or legs:     Sudden numbness in arms or legs:     Sudden onset of difficulty speaking or slurred speech:    Temporary loss of vision in one eye:     Problems with dizziness:  Gastrointestinal    Blood in stool:     Vomited blood:         Genitourinary    Burning when urinating:     Blood in urine:        Psychiatric    Major depression:         Hematologic    Bleeding problems:    Problems with blood clotting too easily:        Skin    Rashes or ulcers:        Constitutional    Fever or chills:      PHYSICAL EXAM  Vitals:   03/12/21 1423  BP: (!) 151/88  Pulse: 88  Resp: 14  Temp: 98.1 F (36.7 C)  TempSrc: Temporal  SpO2: 98%  Weight: 130 lb (59 kg)  Height: _0  (1.6 m)    Constitutional: well appearing. no distress. Appears well nourished.  Neurologic: CN intact. no focal findings. no sensory loss. Psychiatric: Mood and affect symmetric and appropriate. Eyes: No icterus. No conjunctival pallor. Ears, nose, throat: mucous membranes moist. Midline trachea.  Cardiac: regular rate and rhythm.  Respiratory: unlabored. Abdominal: soft, non-tender, non-distended.  Peripheral vascular:  2+ radial pulses and brachial pulses bilaterally Extremity: No edema. No cyanosis. No pallor.  Skin: No gangrene. No ulceration.  Lymphatic: No Stemmer's sign. No palpable lymphadenopathy.  PERTINENT LABORATORY AND RADIOLOGIC DATA  Most recent CBC CBC Latest Ref Rng & Units 04/17/2020 01/23/2020 01/11/2020  WBC 3.4 - 10.8 x10E3/uL 15.2(H) 11.7(H) 25.4(H)  Hemoglobin 13.0 - 17.7 g/dL 12.7(L) 14.5 11.8(L)  Hematocrit 37.5 - 51.0 % 37.3(L) 43.4 34.5(L)  Platelets 150 - 450 x10E3/uL 222 348 203     Most recent CMP CMP Latest Ref Rng & Units 04/17/2020 01/23/2020 01/13/2020  Glucose 65 - 99 mg/dL 77 114(H) 184(H)  BUN 6 - 20 mg/dL 26(H) 35(H) 24(H)   Creatinine 0.76 - 1.27 mg/dL 3.17(H) 3.25(H) 2.83(H)  Sodium 134 - 144 mmol/L 142 142 137  Potassium 3.5 - 5.2 mmol/L 4.6 4.9 4.0  Chloride 96 - 106 mmol/L 103 103 109  CO2 20 - 29 mmol/L _1 Calcium 8.7 - 10.2 mg/dL 10.0 9.6 8.2(L)  Total Protein 6.0 - 8.5 g/dL 6.7 6.7 -  Total Bilirubin 0.0 - 1.2 mg/dL 0.2 0.3 -  Alkaline Phos 39 - 117 IU/L 106 102 -  AST 0 - 40 IU/L 15 18 -  ALT 0 - 44 IU/L 15 22 -    Renal function CrCl cannot be calculated (Patient's most recent lab result is older than the maximum 21 days allowed.).  HbA1c, POC (controlled diabetic range) (%)  Date Value  01/08/2021 7.0    LDL Chol Calc (NIH)  Date Value Ref Range Status  04/17/2020 55 0 - 99 mg/dL Final        Yevonne Aline. Stanford Breed, MD [facility] Phone Number: [phone] 03/12/2021 5:37 PM

## 2021-03-14 ENCOUNTER — Other Ambulatory Visit
Admission: RE | Admit: 2021-03-14 | Discharge: 2021-03-14 | Disposition: A | Discharge status: Home / Self Care | Payer: Medicaid Other | Source: Ambulatory Visit | Attending: Vascular Surgery | Admitting: Vascular Surgery

## 2021-03-14 ENCOUNTER — Other Ambulatory Visit: Payer: Self-pay

## 2021-03-14 DIAGNOSIS — Z20822 Contact with and (suspected) exposure to covid-19: Secondary | ICD-10-CM | POA: Insufficient documentation

## 2021-03-14 DIAGNOSIS — Z01812 Encounter for preprocedural laboratory examination: Secondary | ICD-10-CM | POA: Insufficient documentation

## 2021-03-14 LAB — SARS CORONAVIRUS 2 (TAT 6-24 HRS): SARS Coronavirus 2: NEGATIVE

## 2021-03-14 NOTE — Progress Notes (Signed)
I was unable to reach patient , I called his mother Mrs. Hancox, who reported that [employer] has had a problem with CBG today.   CBG went to 70 and then up to 285, as we spoke CBG was 78.  Mrs. Alsop had called Dr. Mora Appl office and spoke to Dr. On call , she reported the issue patient is having with CBG.  The physician on call told patient that he should come on in tomorrow and see how blood sugars are in am. I gave Mrs. Reichenberger instructions for a Diabetic type I patient.

## 2021-03-15 ENCOUNTER — Encounter (HOSPITAL_COMMUNITY)
Admission: RE | Disposition: A | Discharge status: Home / Self Care | Payer: Self-pay | Source: Home / Self Care | Attending: Vascular Surgery

## 2021-03-15 ENCOUNTER — Ambulatory Visit (HOSPITAL_COMMUNITY)
Admission: RE | Admit: 2021-03-15 | Discharge: 2021-03-15 | Disposition: A | Discharge status: Home / Self Care | Payer: Medicaid Other | Attending: Vascular Surgery | Admitting: Vascular Surgery

## 2021-03-15 ENCOUNTER — Ambulatory Visit (HOSPITAL_COMMUNITY): Payer: Medicaid Other | Admitting: Anesthesiology

## 2021-03-15 ENCOUNTER — Other Ambulatory Visit: Payer: Self-pay

## 2021-03-15 ENCOUNTER — Encounter (HOSPITAL_COMMUNITY): Payer: Self-pay | Admitting: Vascular Surgery

## 2021-03-15 DIAGNOSIS — N184 Chronic kidney disease, stage 4 (severe): Secondary | ICD-10-CM

## 2021-03-15 DIAGNOSIS — N186 End stage renal disease: Secondary | ICD-10-CM | POA: Insufficient documentation

## 2021-03-15 DIAGNOSIS — Z794 Long term (current) use of insulin: Secondary | ICD-10-CM | POA: Insufficient documentation

## 2021-03-15 DIAGNOSIS — I120 Hypertensive chronic kidney disease with stage 5 chronic kidney disease or end stage renal disease: Secondary | ICD-10-CM | POA: Diagnosis not present

## 2021-03-15 DIAGNOSIS — Z79899 Other long term (current) drug therapy: Secondary | ICD-10-CM | POA: Diagnosis not present

## 2021-03-15 DIAGNOSIS — Z7902 Long term (current) use of antithrombotics/antiplatelets: Secondary | ICD-10-CM | POA: Diagnosis not present

## 2021-03-15 DIAGNOSIS — Z87891 Personal history of nicotine dependence: Secondary | ICD-10-CM | POA: Diagnosis not present

## 2021-03-15 DIAGNOSIS — E1022 Type 1 diabetes mellitus with diabetic chronic kidney disease: Secondary | ICD-10-CM | POA: Diagnosis not present

## 2021-03-15 DIAGNOSIS — N185 Chronic kidney disease, stage 5: Secondary | ICD-10-CM | POA: Diagnosis not present

## 2021-03-15 DIAGNOSIS — E785 Hyperlipidemia, unspecified: Secondary | ICD-10-CM | POA: Diagnosis not present

## 2021-03-15 HISTORY — PX: AV FISTULA PLACEMENT: SHX1204

## 2021-03-15 LAB — POCT I-STAT, CHEM 8
BUN: 46 mg/dL — ABNORMAL HIGH (ref 6–20)
Calcium, Ion: 1.15 mmol/L (ref 1.15–1.40)
Chloride: 103 mmol/L (ref 98–111)
Creatinine, Ser: 5.9 mg/dL — ABNORMAL HIGH (ref 0.61–1.24)
Glucose, Bld: 161 mg/dL — ABNORMAL HIGH (ref 70–99)
HCT: 28 % — ABNORMAL LOW (ref 39.0–52.0)
Hemoglobin: 9.5 g/dL — ABNORMAL LOW (ref 13.0–17.0)
Potassium: 4.3 mmol/L (ref 3.5–5.1)
Sodium: 138 mmol/L (ref 135–145)
TCO2: 26 mmol/L (ref 22–32)

## 2021-03-15 LAB — GLUCOSE, CAPILLARY
Glucose-Capillary: 144 mg/dL — ABNORMAL HIGH (ref 70–99)
Glucose-Capillary: 213 mg/dL — ABNORMAL HIGH (ref 70–99)

## 2021-03-15 SURGERY — ARTERIOVENOUS (AV) FISTULA CREATION
Anesthesia: Regional | Laterality: Left

## 2021-03-15 MED ORDER — FENTANYL CITRATE (PF) 100 MCG/2ML IJ SOLN
INTRAMUSCULAR | Status: AC
  Administered 2021-03-15: 50 ug via INTRAVENOUS
  Filled 2021-03-15: qty 2

## 2021-03-15 MED ORDER — PROPOFOL 10 MG/ML IV BOLUS
INTRAVENOUS | Status: AC
  Filled 2021-03-15: qty 20

## 2021-03-15 MED ORDER — CEFAZOLIN SODIUM-DEXTROSE 2-4 GM/100ML-% IV SOLN
INTRAVENOUS | Status: AC
  Filled 2021-03-15: qty 100

## 2021-03-15 MED ORDER — SODIUM CHLORIDE 0.9 % IV SOLN: INTRAVENOUS | Status: DC

## 2021-03-15 MED ORDER — FENTANYL CITRATE (PF) 100 MCG/2ML IJ SOLN: 50.0000 ug | Freq: Once | INTRAMUSCULAR | Status: AC

## 2021-03-15 MED ORDER — LIDOCAINE HCL (PF) 1 % IJ SOLN
INTRAMUSCULAR | Status: AC
  Filled 2021-03-15: qty 30

## 2021-03-15 MED ORDER — OXYCODONE-ACETAMINOPHEN 5-325 MG PO TABS
1.0000 | ORAL_TABLET | Freq: Once | ORAL | Status: AC
  Administered 2021-03-15: 1 via ORAL

## 2021-03-15 MED ORDER — ONDANSETRON HCL 4 MG/2ML IJ SOLN
INTRAMUSCULAR | Status: AC
  Filled 2021-03-15: qty 2

## 2021-03-15 MED ORDER — CHLORHEXIDINE GLUCONATE 0.12 % MT SOLN
OROMUCOSAL | Status: AC
  Administered 2021-03-15: 15 mL via OROMUCOSAL
  Filled 2021-03-15: qty 15

## 2021-03-15 MED ORDER — SODIUM CHLORIDE 0.9 % IV SOLN
INTRAVENOUS | Status: DC | PRN
  Administered 2021-03-15: 500 mL

## 2021-03-15 MED ORDER — CHLORHEXIDINE GLUCONATE 0.12 % MT SOLN: 15.0000 mL | Freq: Once | OROMUCOSAL | Status: AC

## 2021-03-15 MED ORDER — FENTANYL CITRATE (PF) 250 MCG/5ML IJ SOLN
INTRAMUSCULAR | Status: DC | PRN
  Administered 2021-03-15 (×3): 25 ug via INTRAVENOUS

## 2021-03-15 MED ORDER — ROPIVACAINE HCL 5 MG/ML IJ SOLN
INTRAMUSCULAR | Status: DC | PRN
Start: 2021-03-15 — End: 2021-03-15
  Administered 2021-03-15: 10 mL via PERINEURAL

## 2021-03-15 MED ORDER — CHLORHEXIDINE GLUCONATE 4 % EX LIQD: 60.0000 mL | Freq: Once | CUTANEOUS | Status: DC

## 2021-03-15 MED ORDER — MIDAZOLAM HCL 2 MG/2ML IJ SOLN: 2.0000 mg | Freq: Once | INTRAMUSCULAR | Status: AC

## 2021-03-15 MED ORDER — OXYCODONE-ACETAMINOPHEN 10-325 MG PO TABS: 1.0000 | ORAL_TABLET | Freq: Four times a day (QID) | ORAL | 0 refills | Status: DC | PRN

## 2021-03-15 MED ORDER — OXYCODONE-ACETAMINOPHEN 5-325 MG PO TABS
ORAL_TABLET | ORAL | Status: AC
  Filled 2021-03-15: qty 1

## 2021-03-15 MED ORDER — ONDANSETRON HCL 4 MG/2ML IJ SOLN
INTRAMUSCULAR | Status: DC | PRN
  Administered 2021-03-15: 4 mg via INTRAVENOUS

## 2021-03-15 MED ORDER — MIDAZOLAM HCL 2 MG/2ML IJ SOLN
INTRAMUSCULAR | Status: AC
  Administered 2021-03-15: 2 mg via INTRAVENOUS
  Filled 2021-03-15: qty 2

## 2021-03-15 MED ORDER — LIDOCAINE-EPINEPHRINE (PF) 1.5 %-1:200000 IJ SOLN
INTRAMUSCULAR | Status: DC | PRN
  Administered 2021-03-15: 15 mL via PERINEURAL

## 2021-03-15 MED ORDER — PROPOFOL 500 MG/50ML IV EMUL
INTRAVENOUS | Status: DC | PRN
  Administered 2021-03-15: 75 ug/kg/min via INTRAVENOUS

## 2021-03-15 MED ORDER — CEFAZOLIN SODIUM-DEXTROSE 2-4 GM/100ML-% IV SOLN
2.0000 g | INTRAVENOUS | Status: AC
  Administered 2021-03-15: 2 g via INTRAVENOUS

## 2021-03-15 MED ORDER — SODIUM CHLORIDE 0.9 % IV SOLN
INTRAVENOUS | Status: AC
  Filled 2021-03-15: qty 1.2

## 2021-03-15 MED ORDER — LIDOCAINE HCL 1 % IJ SOLN
INTRAMUSCULAR | Status: DC | PRN
  Administered 2021-03-15: 30 mL via INTRADERMAL

## 2021-03-15 MED ORDER — SODIUM CHLORIDE 0.9 % IV SOLN: INTRAVENOUS | Status: DC | PRN

## 2021-03-15 MED ORDER — ORAL CARE MOUTH RINSE: 15.0000 mL | Freq: Once | OROMUCOSAL | Status: AC

## 2021-03-15 MED ORDER — LACTATED RINGERS IV SOLN: INTRAVENOUS | Status: DC

## 2021-03-15 MED ORDER — FENTANYL CITRATE (PF) 250 MCG/5ML IJ SOLN
INTRAMUSCULAR | Status: AC
  Filled 2021-03-15: qty 5

## 2021-03-15 MED ORDER — 0.9 % SODIUM CHLORIDE (POUR BTL) OPTIME
TOPICAL | Status: DC | PRN
  Administered 2021-03-15: 1000 mL

## 2021-03-15 SURGICAL SUPPLY — 39 items
ARMBAND PINK RESTRICT EXTREMIT (MISCELLANEOUS) ×4 IMPLANT
BLADE CLIPPER SURG (BLADE) ×2 IMPLANT
BNDG ELASTIC 4X5.8 VLCR STR LF (GAUZE/BANDAGES/DRESSINGS) ×2 IMPLANT
BNDG GAUZE ELAST 4 BULKY (GAUZE/BANDAGES/DRESSINGS) ×2 IMPLANT
CANISTER SUCT 3000ML PPV (MISCELLANEOUS) ×2 IMPLANT
CLIP VESOCCLUDE MED 6/CT (CLIP) ×2 IMPLANT
CLIP VESOCCLUDE SM WIDE 6/CT (CLIP) ×4 IMPLANT
COVER PROBE W GEL 5X96 (DRAPES) ×2 IMPLANT
COVER WAND RF STERILE (DRAPES) ×2 IMPLANT
DECANTER SPIKE VIAL GLASS SM (MISCELLANEOUS) ×2 IMPLANT
DERMABOND ADVANCED (GAUZE/BANDAGES/DRESSINGS) ×1
DERMABOND ADVANCED .7 DNX12 (GAUZE/BANDAGES/DRESSINGS) ×1 IMPLANT
ELECT REM PT RETURN 9FT ADLT (ELECTROSURGICAL) ×2
ELECTRODE REM PT RTRN 9FT ADLT (ELECTROSURGICAL) ×1 IMPLANT
GAUZE SPONGE 4X4 12PLY STRL LF (GAUZE/BANDAGES/DRESSINGS) ×2 IMPLANT
GLOVE BIO SURGEON STRL SZ7.5 (GLOVE) ×2 IMPLANT
GLOVE SRG 8 PF TXTR STRL LF DI (GLOVE) ×1 IMPLANT
GLOVE SURG UNDER POLY LF SZ8 (GLOVE) ×1
GOWN STRL REUS W/ TWL LRG LVL3 (GOWN DISPOSABLE) ×2 IMPLANT
GOWN STRL REUS W/ TWL XL LVL3 (GOWN DISPOSABLE) ×2 IMPLANT
GOWN STRL REUS W/TWL LRG LVL3 (GOWN DISPOSABLE) ×2
GOWN STRL REUS W/TWL XL LVL3 (GOWN DISPOSABLE) ×2
HEMOSTAT SPONGE AVITENE ULTRA (HEMOSTASIS) IMPLANT
KIT BASIN OR (CUSTOM PROCEDURE TRAY) ×2 IMPLANT
KIT TURNOVER KIT B (KITS) ×2 IMPLANT
NS IRRIG 1000ML POUR BTL (IV SOLUTION) ×2 IMPLANT
PACK CV ACCESS (CUSTOM PROCEDURE TRAY) ×2 IMPLANT
PAD ARMBOARD 7.5X6 YLW CONV (MISCELLANEOUS) ×4 IMPLANT
SPONGE LAP 4X18 RFD (DISPOSABLE) ×2 IMPLANT
SUT MNCRL AB 4-0 PS2 18 (SUTURE) ×2 IMPLANT
SUT PROLENE 6 0 BV (SUTURE) ×2 IMPLANT
SUT PROLENE 6 0 CC (SUTURE) ×4 IMPLANT
SUT PROLENE 7 0 BV 1 (SUTURE) IMPLANT
SUT SILK 3 0 SH CR/8 (SUTURE) ×2 IMPLANT
SUT VIC AB 3-0 SH 27 (SUTURE) ×4
SUT VIC AB 3-0 SH 27X BRD (SUTURE) ×4 IMPLANT
TOWEL GREEN STERILE (TOWEL DISPOSABLE) ×2 IMPLANT
UNDERPAD 30X36 HEAVY ABSORB (UNDERPADS AND DIAPERS) ×2 IMPLANT
WATER STERILE IRR 1000ML POUR (IV SOLUTION) ×2 IMPLANT

## 2021-03-15 NOTE — Anesthesia Procedure Notes (Signed)
Anesthesia Regional Block: Supraclavicular block   Pre-Anesthetic Checklist: ,, timeout performed, Correct Patient, Correct Site, Correct Laterality, Correct Procedure, Correct Position, site marked, Risks and benefits discussed,  Surgical consent,  Pre-op evaluation,  At surgeon's request and post-op pain management  Laterality: Left  Prep: chloraprep       Needles:  Injection technique: Single-shot  Needle Type: Echogenic Stimulator Needle     Needle Length: 9cm  Needle Gauge: 21     Additional Needles:   Procedures:,,,, ultrasound used (permanent image in chart),,,,  Narrative:  Start time: 03/15/2021 10:25 AM End time: 03/15/2021 10:30 AM Injection made incrementally with aspirations every 5 mL.  Performed by: Personally  Anesthesiologist: Effie Berkshire, MD  Additional Notes: Patient tolerated the procedure well. Local anesthetic introduced in an incremental fashion under minimal resistance after negative aspirations. No paresthesias were elicited. After completion of the procedure, no acute issues were identified and patient continued to be monitored by RN.

## 2021-03-15 NOTE — Anesthesia Postprocedure Evaluation (Signed)
Anesthesia Post Note  Patient: George [name]  Procedure(s) Performed: LEFT UPPER EXTREMITY ARTERIOVENOUS (AV) FISTULA CREATION (Left )     Patient location during evaluation: PACU Anesthesia Type: Regional Level of consciousness: awake and alert Pain management: pain level controlled Vital Signs Assessment: post-procedure vital signs reviewed and stable Respiratory status: spontaneous breathing, nonlabored ventilation, respiratory function stable and patient connected to nasal cannula oxygen Cardiovascular status: stable and blood pressure returned to baseline Postop Assessment: no apparent nausea or vomiting Anesthetic complications: no   No complications documented.  Last Vitals:  Vitals:   03/15/21 1335 03/15/21 1350  BP: 138/87 138/84  Pulse: 97 96  Resp: 16 13  Temp:  37 C  SpO2: 96% 95%    Last Pain:  Vitals:   03/15/21 1350  TempSrc:   PainSc: 3                  Effie Berkshire

## 2021-03-15 NOTE — Anesthesia Preprocedure Evaluation (Addendum)
Anesthesia Evaluation  Patient identified by MRN, date of birth, ID band Patient awake    Reviewed: Allergy & Precautions, NPO status , Patient's Chart, lab work & pertinent test results  Airway Mallampati: I  TM Distance: >3 FB Neck ROM: Full    Dental  (+) Teeth Intact, Dental Advisory Given   Pulmonary neg pulmonary ROS, former smoker,    breath sounds clear to auscultation       Cardiovascular hypertension, Pt. on medications and Pt. on home beta blockers  Rhythm:Regular Rate:Normal     Neuro/Psych CVA negative psych ROS   GI/Hepatic Neg liver ROS, GERD  Medicated,  Endo/Other  diabetes, Type 1, Insulin Dependent  Renal/GU Renal disease     Musculoskeletal negative musculoskeletal ROS (+)   Abdominal Normal abdominal exam  (+)   Peds  Hematology negative hematology ROS (+)   Anesthesia Other Findings   Reproductive/Obstetrics                            Anesthesia Physical Anesthesia Plan  ASA: III  Anesthesia Plan: Regional   Post-op Pain Management:    Induction: Intravenous  PONV Risk Score and Plan: 2 and Ondansetron, Propofol infusion and Midazolam  Airway Management Planned: Natural Airway and Simple Face Mask  Additional Equipment: None  Intra-op Plan:   Post-operative Plan:   Informed Consent: I have reviewed the patients History and Physical, chart, labs and discussed the procedure including the risks, benefits and alternatives for the proposed anesthesia with the patient or authorized representative who has indicated his/her understanding and acceptance.       Plan Discussed with: CRNA  Anesthesia Plan Comments: (Lab Results      Component                Value               Date                      WBC                      15.2 (H)            04/17/2020                HGB                      9.5 (L)             03/15/2021                HCT                       28.0 (L)            03/15/2021                MCV                      89                  04/17/2020                PLT                      222  04/17/2020           )       Anesthesia Quick Evaluation

## 2021-03-15 NOTE — Op Note (Signed)
    OPERATIVE REPORT  DATE OF SURGERY: 03/15/2021  PATIENT: George [name], 35 y.o. male MRN: [id number]  DOB: February 14, 1986  PRE-OPERATIVE DIAGNOSIS: Chronic renal insufficiency  POST-OPERATIVE DIAGNOSIS:  Same  PROCEDURE: Left forearm basilic vein transposition fistula  SURGEON:  Curt Jews, M.D.  PHYSICIAN ASSISTANT: Setzer PA-C  The assistant was needed for exposure and to expedite the case  ANESTHESIA: Axillary block and IV sedation  EBL: per anesthesia record  Total I/O In: 400 [I.V.:400] Out: 40 [Blood:40]  BLOOD ADMINISTERED: none  DRAINS: none  SPECIMEN: none  COUNTS CORRECT:  YES  PATIENT DISPOSITION:  PACU - hemodynamically stable  PROCEDURE DETAILS: The patient was taken to room placed supine position where the area of the left arm was prepped draped you sterile fashion.  SonoSite ultrasound was used to visualize the veins of the arm.  The patient had a very large visible basilic vein extending from his wrist to the antecubital space.  Cephalic vein was small and inadequate in the forearm.  The cephalic vein was of good caliber at the antecubital space preoperative imaging had suggested potential sclerosis in the mid upper arm.  Due to the very large caliber basilic vein in the forearm decision was made to proceed with a single-stage basilic vein transposition fistula in the forearm.  Incision was made at the antecubital space over the basilic vein and in the mid forearm and the distal forearm just at the level of the wrist.  The basilic vein was a very good caliber throughout the course of this.  Tributary branches were ligated with 304 0 silk ties and divided.  The vein was ligated distally and was brought through the subcutaneous tunnel to the antecubital space.  The vein was gently dilated with heparinized saline and was of excellent caliber throughout its course.  Separate incision was made over the radial artery at the wrist and the radial artery was exposed.   There is a good normal caliber radial artery with no evidence of atherosclerotic change.  A tunnel was created from the radial artery to the antecubital space and the basilic vein was brought through the tunnel.  Care was taken to prevent twisting.  The artery was occluded proximally distally and was opened with an 11 blade similarly pot scissors.  The vein was cut to the appropriate length and was spatulated and sewn end-to-side to the artery with a running 6-0 Prolene suture.  Prior to completion of the closure a 2 dilator passed easily through the proximal distal anastomosis.  The anastomosis was completed and excellent flow was noted through the vein.  Wounds irrigated with saline.  Hemostasis to electrocautery.  The wounds were closed with 3-0 Vicryl in the subcutaneous and subcuticular tissue.  Sterile dressing was applied and the patient was transferred to the recovery room in stable condition.  Patient had been on Plavix and stopped 2 days prior to the procedure.  Did have a great deal of oozing at all incisions.  For this reason Curlex and Ace wrap were placed over the wound as well.   Rosetta Posner, M.D., [facility] 03/15/2021 1:15 PM  Note: Portions of this report may have been transcribed using voice recognition software.  Every effort has been made to ensure accuracy; however, inadvertent computerized transcription errors may still be present.

## 2021-03-15 NOTE — Transfer of Care (Signed)
Immediate Anesthesia Transfer of Care Note  Patient: George [name]  Procedure(s) Performed: LEFT UPPER EXTREMITY ARTERIOVENOUS (AV) FISTULA CREATION (Left )  Patient Location: PACU  Anesthesia Type:MAC combined with regional for post-op pain  Level of Consciousness: awake, alert  and oriented  Airway & Oxygen Therapy: Patient Spontanous Breathing  Post-op Assessment: Report given to RN and Post -op Vital signs reviewed and stable  Post vital signs: Reviewed and stable  Last Vitals:  Vitals Value Taken Time  BP 138/84 03/15/21 1321  Temp    Pulse 96 03/15/21 1321  Resp 14 03/15/21 1321  SpO2 96 % 03/15/21 1321  Vitals shown include unvalidated device data.  Last Pain:  Vitals:   03/15/21 0814  TempSrc: Oral         Complications: No complications documented.

## 2021-03-15 NOTE — Interval H&P Note (Signed)
History and Physical Interval Note:  03/15/2021 10:26 AM  George [name]  has presented today for surgery, with the diagnosis of ESRD.  The various methods of treatment have been discussed with the patient and family. After consideration of risks, benefits and other options for treatment, the patient has consented to  Procedure(s): LEFT UPPER EXTREMITY ARTERIOVENOUS (AV) FISTULA CREATION (Left) as a surgical intervention.  The patient's history has been reviewed, patient examined, no change in status, stable for surgery.  I have reviewed the patient's chart and labs.  Questions were answered to the patient's satisfaction.     Curt Jews

## 2021-03-16 ENCOUNTER — Encounter (HOSPITAL_COMMUNITY): Payer: Self-pay | Admitting: Vascular Surgery

## 2021-03-22 ENCOUNTER — Ambulatory Visit: Payer: Medicaid Other | Admitting: Internal Medicine

## 2021-04-02 ENCOUNTER — Other Ambulatory Visit: Payer: Self-pay

## 2021-04-02 DIAGNOSIS — N186 End stage renal disease: Secondary | ICD-10-CM

## 2021-04-15 ENCOUNTER — Ambulatory Visit: Payer: Medicaid Other | Admitting: Internal Medicine

## 2021-04-15 ENCOUNTER — Other Ambulatory Visit: Payer: Self-pay

## 2021-04-15 ENCOUNTER — Encounter: Payer: Self-pay | Admitting: Internal Medicine

## 2021-04-15 VITALS — BP 128/76 | HR 73 | Ht 63.0 in | Wt 128.4 lb

## 2021-04-15 DIAGNOSIS — N184 Chronic kidney disease, stage 4 (severe): Secondary | ICD-10-CM

## 2021-04-15 DIAGNOSIS — E10319 Type 1 diabetes mellitus with unspecified diabetic retinopathy without macular edema: Secondary | ICD-10-CM

## 2021-04-15 DIAGNOSIS — E1022 Type 1 diabetes mellitus with diabetic chronic kidney disease: Secondary | ICD-10-CM

## 2021-04-15 DIAGNOSIS — E1065 Type 1 diabetes mellitus with hyperglycemia: Secondary | ICD-10-CM | POA: Diagnosis not present

## 2021-04-15 LAB — POCT GLYCOSYLATED HEMOGLOBIN (HGB A1C): Hemoglobin A1C: 5.8 % — AB (ref 4.0–5.6)

## 2021-04-15 MED ORDER — DEXCOM G6 SENSOR MISC: 2 refills | Status: DC

## 2021-04-15 MED ORDER — DEXCOM G6 TRANSMITTER MISC: 1.0000 | 3 refills | Status: DC

## 2021-04-15 NOTE — Patient Instructions (Addendum)
-   Decrease  lantus to 16  units daily - Novolog 5 units with each meal - Snack : Novolog: Insulin to carb ratio of 1:15   - Novolog correctional insulin: ADD extra units on insulin to your meal-time novolog dose if your blood sugars are higher than . Use the scale below to help guide you:   Blood sugar before meal Number of units to inject  Less than 185 0 unit  186-  240 1 units  241-  295 2 units  296-  350 3 units  351 -  405 4 units     -HOW TO TREAT LOW BLOOD SUGARS (Blood sugar LESS THAN 70 MG/DL)  Please follow the RULE OF 15 for the treatment of hypoglycemia treatment (when your (blood sugars are less than 70 mg/dL)    STEP 1: Take 15 grams of carbohydrates when your blood sugar is low, which includes:   3-4 GLUCOSE TABS  OR  3-4 OZ OF JUICE OR REGULAR SODA OR  ONE TUBE OF GLUCOSE GEL     STEP 2: RECHECK blood sugar in 15 MINUTES STEP 3: If your blood sugar is still low at the 15 minute recheck --> then, go back to STEP 1 and treat AGAIN with another 15 grams of carbohydrates.

## 2021-04-15 NOTE — Progress Notes (Signed)
Name: [facility]  Age/ Sex: 35 y.o., male   MRN/ DOB: [id number], 1986-03-21     PCP: Elsie Stain, MD   Reason for Endocrinology Evaluation: Type 1 Diabetes Mellitus  Initial Endocrine Consultative Visit: 02/07/2020    PATIENT IDENTIFIER: [name] is a 35 y.o. male with a past medical history of HTN, T1DM and hx of CVA . The patient has followed with Endocrinology clinic since 2/9.2021 for consultative assistance with management of his diabetes.  DIABETIC HISTORY:  [name] was diagnosed with T1DM at age 39. His hemoglobin A1c has ranged from 7.2% in 2021, peaking at 8.2%    SUBJECTIVE:   During the last visit (09/07/2020): A1c 6.1% ( Skewed due to CKD) adjusted MDi regimen         Today (04/15/2021): [name] is here for a follow up on diabetes.  He checks his blood sugars 4 times daily, preprandial. The patient has had hypoglycemic episodes since the last clinic visit, which typically occur 4 x /week - most often occuring after a bolus. The patient is  symptomatic with these episodes.  S/P Left AVF 02/2021  Had recent diarrhea but no nausea   HOME DIABETES REGIMEN:  Lantus 18 units daily  Novolog 5 units TIDQAC Novolog with snacks 1:15  CF : Novolog (Bg-130/55)     Statin: Yes ACE-I/ARB: No    METER DOWNLOAD SUMMARY: Date range evaluated: 4/4-4/18/2022 Average Number Tests/Day = 2.7 Overall Mean FS Glucose = 173  BG Ranges: Low = 51 High = 424   Hypoglycemic Events/30 Days: BG < 50 = 0 Episodes of symptomatic severe hypoglycemia = 0    DIABETIC COMPLICATIONS: Microvascular complications:   Left retinopathy, CKD   Denies:neuropathy  Last eye exam: Completed 08/15/2020  Macrovascular complications:   CVA 07/5461 , 09/2019 and 12/2019  Denies: CAD, PVD,   HISTORY:  Past Medical History:  Past Medical History:  Diagnosis Date  . AKI  (acute kidney injury) ([place])   . Cerebral infarction ([place])   . Hypertension   . Neurologic deficit due to acute ischemic cerebrovascular accident (CVA) ([place]) 06/13/2019  . Supraventricular tachycardia ([place])   . Type 1 diabetes ([place])    Past Surgical History:  Past Surgical History:  Procedure Laterality Date  . AV FISTULA PLACEMENT Left 03/15/2021   Procedure: LEFT UPPER EXTREMITY ARTERIOVENOUS (AV) FISTULA CREATION;  Surgeon: Rosetta Posner, MD;  Location: [facility];  Service: Vascular;  Laterality: Left;  . BUBBLE STUDY  06/20/2019   Procedure: BUBBLE STUDY;  Surgeon: Fay Records, MD;  Location: [place];  Service: Cardiovascular;;  . TEE WITHOUT CARDIOVERSION N/A 06/20/2019   Procedure: TRANSESOPHAGEAL ECHOCARDIOGRAM (TEE);  Surgeon: Fay Records, MD;  Location: [facility] ENDOSCOPY;  Service: Cardiovascular;  Laterality: N/A;    Social History:  reports that he quit smoking about 9 months ago. His smoking use included cigarettes. He started smoking about 10 years ago. He smoked 0.25 packs per day. He has never used smokeless tobacco. He reports previous drug use. Drug: Marijuana. No history on file for alcohol use. Family History:  Family History  Problem Relation Age of Onset  . Hypertension Mother   . Diabetes Mother   . Hyperlipidemia Mother   . Diabetes Father      HOME MEDICATIONS: Allergies as of 04/15/2021   No Known Allergies     Medication List       Accurate as of April 15, 2021  2:46 PM. If you have  any questions, ask your nurse or doctor.        amLODipine 10 MG tablet Commonly known as: NORVASC Take 1 tablet (10 mg total) by mouth daily.   Auryxia 1 GM 210 MG(Fe) tablet Generic drug: ferric citrate Take 210 mg by mouth 3 (three) times daily.   carvedilol 25 MG tablet Commonly known as: COREG Take 1 tablet (25 mg total) by mouth 2 (two) times daily with a meal.   clopidogrel 75 MG tablet Commonly known as: PLAVIX Take 1 tablet (75 mg total) by mouth daily.    Dexcom G6 Receiver Devi Use as instructed to check blood sugar 3 times daily.   Dexcom G6 Sensor Misc Use as instructed to check blood sugar 3 times daily.   Dexcom G6 Sensor Misc 1 Device by Does not apply route as directed.   Dexcom G6 Transmitter Misc Inject 1 Device into the skin as directed. Use as instructed to check blood sugar 3 times daily.   ferrous sulfate 325 (65 FE) MG tablet Take 325 mg by mouth daily with breakfast.   hydrALAZINE 100 MG tablet Commonly known as: APRESOLINE Take 1 tablet (100 mg total) by mouth 3 (three) times daily.   Lantus SoloStar 100 UNIT/ML Solostar Pen Generic drug: insulin glargine Inject 18 Units into the skin daily.   NovoLOG FlexPen 100 UNIT/ML FlexPen Generic drug: insulin aspart Inject 5 Units into the skin 3 (three) times daily with meals. Mac daily 30 units to include correction scale and snack coverage What changed:   how much to take  when to take this  additional instructions   OneTouch Verio test strip Generic drug: glucose blood Use as instructed to check blood sugar 3 times daily.   oxyCODONE-acetaminophen 10-325 MG tablet Commonly known as: Percocet Take 1 tablet by mouth every 6 (six) hours as needed for pain.   pantoprazole 40 MG tablet Commonly known as: PROTONIX Take 1 tablet (40 mg total) by mouth daily. For stomach protection   Pen Needles 3/16" 31G X 5 MM Misc To inject insulin with novolog flex pen, 3 times daily with meals.   rosuvastatin 20 MG tablet Commonly known as: Crestor Take 1 tablet (20 mg total) by mouth daily.   sodium bicarbonate 650 MG tablet Take 650 mg by mouth 2 (two) times daily.   Vitamin D3 50 MCG (2000 UT) Tabs Take 200 Units by mouth daily.        OBJECTIVE:   Vital Signs: BP 128/76   Pulse 73   Ht _0  (1.6 m)   Wt 128 lb 6 oz (58.2 kg)   SpO2 98%   BMI 22.74 kg/m   Wt Readings from Last 3 Encounters:  04/15/21 128 lb 6 oz (58.2 kg)  03/15/21 130 lb (59 kg)   03/12/21 130 lb (59 kg)     Exam: General: Pt appears well and is in NAD  Lungs: Clear with good BS bilat with no rales, rhonchi, or wheezes  Heart: RRR with normal S1 and S2 and no gallops; no murmurs; no rub  Abdomen: Normoactive bowel sounds, soft, nontender, without masses or organomegaly palpable  Extremities: No pretibial edema. No tremor.  Skin: Normal texture and temperature to palpation.   Neuro: MS is good with appropriate affect, pt is alert and Ox3      DM Foot Exam 04/15/2021  The skin of the feet is intact without sores or ulcerations. The pedal pulses are 2+ on right and 2+ on left. The  sensation is intact to a screening 5.07, 10 gram monofilament bilaterally     DATA REVIEWED:  Lab Results  Component Value Date   HGBA1C 5.8 (A) 04/15/2021   HGBA1C 7.0 01/08/2021   HGBA1C 6.1 (H) 07/25/2020   Lab Results  Component Value Date   LDLCALC 55 04/17/2020   CREATININE 5.90 (H) 03/15/2021    Lab Results  Component Value Date   CHOL 118 04/17/2020   HDL 47 04/17/2020   LDLCALC 55 04/17/2020   TRIG 81 04/17/2020   CHOLHDL 2.5 04/17/2020         ASSESSMENT / PLAN / RECOMMENDATIONS:    1) Type 1 Diabetes Mellitus, Poorly controlled, With retinopathic and CKD IV complications - Most recent A1c of 5.8 %. Goal A1c < 7.0 %.     - A1c skewed due to CKD, also due to iron intake.  - Has not been able to obtain the dexcom yet - His average Bg's on the meter download are down from 200 to 173 mg/dL but he is having more hypoglycemic episodes, this is mainly noted after a bolus for hyperglycemia, he has not been using the correctiona scale that I provided him , so whatever he has been using, its strong.  - I am also going to reduce lantus as below     Plan: MEDICATIONS: - Decrease  lantus 16 units daily - Novolog 5 units with each meal - Snack : Novolog: Insulin to carb ratio of 1:15  - CF: Novolog ( BG- 130/55)   EDUCATION / INSTRUCTIONS:  BG  monitoring instructions: Patient is instructed to check his blood sugars 4 times a day, before meals and bedtime .  Call [facility] if: BG persistently < 70 or > 300. . I reviewed the Rule of 15 for the treatment of hypoglycemia in detail with the patient. Literature supplied.   2) Diabetic complications:   Eye: Does have known diabetic retinopathy.   Neuro/ Feet: Does not  have known diabetic peripheral neuropathy .   Renal: Patient does  have known baseline CKD. He   is not  on an ACEI/ARB at present.     F/U in 6 months    Signed electronically by: Mack Guise, MD  [facility]  [facility] [address] [place], [place] [address] Phone: [phone] FAX: [phone]   CC: Elsie Stain, MD [address] [place] [address] Phone: [phone]  Fax: [phone]  Return to Endocrinology clinic as below: Future Appointments  Date Time Provider [place]  04/29/2021  3:00 PM [facility] 4 - [facility]  04/29/2021  3:40 PM [facility] PA [name]  05/08/2021 11:00 AM Elsie Stain, MD [facility] None

## 2021-04-18 ENCOUNTER — Encounter (HOSPITAL_COMMUNITY): Payer: Medicaid Other

## 2021-04-22 DIAGNOSIS — Z0271 Encounter for disability determination: Secondary | ICD-10-CM

## 2021-04-29 ENCOUNTER — Other Ambulatory Visit: Payer: Self-pay

## 2021-04-29 ENCOUNTER — Ambulatory Visit (HOSPITAL_COMMUNITY)
Admission: RE | Admit: 2021-04-29 | Discharge: 2021-04-29 | Disposition: A | Discharge status: Home / Self Care | Payer: Medicaid Other | Source: Ambulatory Visit | Attending: Surgery | Admitting: Surgery

## 2021-04-29 ENCOUNTER — Ambulatory Visit (INDEPENDENT_AMBULATORY_CARE_PROVIDER_SITE_OTHER): Payer: Medicaid Other | Admitting: Physician Assistant

## 2021-04-29 VITALS — BP 173/97 | HR 94 | Temp 98.1°F | Resp 20 | Ht 63.0 in | Wt 121.1 lb

## 2021-04-29 DIAGNOSIS — N186 End stage renal disease: Secondary | ICD-10-CM | POA: Diagnosis not present

## 2021-04-29 DIAGNOSIS — N184 Chronic kidney disease, stage 4 (severe): Secondary | ICD-10-CM

## 2021-04-29 NOTE — Progress Notes (Signed)
POST OPERATIVE OFFICE NOTE    CC:  F/u for surgery  HPI:  This is a 35 y.o. male who is s/p single-stage basilic vein transposition fistula in the forearm.  on 03/15/21 by Dr. Donnetta Hutching.    Pt returns today for follow up.  Pt states there is no weakness, pain or loss of sensation in the left UE.  He is currently not on HD.  No Known Allergies  Current Outpatient Medications  Medication Sig Dispense Refill  . amLODipine (NORVASC) 10 MG tablet Take 1 tablet (10 mg total) by mouth daily. 90 tablet 2  . AURYXIA 1 GM 210 MG(Fe) tablet Take 210 mg by mouth 3 (three) times daily.    . carvedilol (COREG) 25 MG tablet Take 1 tablet (25 mg total) by mouth 2 (two) times daily with a meal. 180 tablet 1  . Cholecalciferol (VITAMIN D3) 50 MCG (2000 UT) TABS Take 200 Units by mouth daily.    . clopidogrel (PLAVIX) 75 MG tablet Take 1 tablet (75 mg total) by mouth daily. 90 tablet 3  . Continuous Blood Gluc Sensor (DEXCOM G6 SENSOR) MISC Use as instructed to check blood sugar 3 times daily. 3 each 2  . Continuous Blood Gluc Transmit (DEXCOM G6 TRANSMITTER) MISC Inject 1 Device into the skin as directed. Use as instructed to check blood sugar 3 times daily. 1 each 3  . ferrous sulfate 325 (65 FE) MG tablet Take 325 mg by mouth daily with breakfast.    . glucose blood (ONETOUCH VERIO) test strip Use as instructed to check blood sugar 3 times daily. 100 each 11  . hydrALAZINE (APRESOLINE) 100 MG tablet Take 1 tablet (100 mg total) by mouth 3 (three) times daily. 90 tablet 4  . insulin aspart (NOVOLOG FLEXPEN) 100 UNIT/ML FlexPen Inject 5 Units into the skin 3 (three) times daily with meals. Mac daily 30 units to include correction scale and snack coverage (Patient taking differently: Inject 5-30 Units into the skin See admin instructions. Mac daily 30 units to include correction scale and snack coverage three times a day with meals) 15 mL 3  . insulin glargine (LANTUS SOLOSTAR) 100 UNIT/ML Solostar Pen Inject 18  Units into the skin daily. 15 mL 11  . Insulin Pen Needle (PEN NEEDLES 3/16") 31G X 5 MM MISC To inject insulin with novolog flex pen, 3 times daily with meals. 100 each 4  . oxyCODONE-acetaminophen (PERCOCET) 10-325 MG tablet Take 1 tablet by mouth every 6 (six) hours as needed for pain. 20 tablet 0  . pantoprazole (PROTONIX) 40 MG tablet Take 1 tablet (40 mg total) by mouth daily. For stomach protection 30 tablet 11  . rosuvastatin (CRESTOR) 20 MG tablet Take 1 tablet (20 mg total) by mouth daily. 30 tablet 4  . sodium bicarbonate 650 MG tablet Take 650 mg by mouth 2 (two) times daily.     No current facility-administered medications for this visit.     ROS:  See HPI  Physical Exam:    Findings:  +--------------------+----------+-----------------+--------+  AVF         PSV (cm/s)Flow Vol (mL/min)Comments  +--------------------+----------+-----------------+--------+  Native artery inflow  120      701          +--------------------+----------+-----------------+--------+  AVF Anastomosis     945                 +--------------------+----------+-----------------+--------+     +------------+----------+-------------+-----------+--------+  OUTFLOW VEINPSV (cm/s)Diameter (cm)Depth (cm) Describe  +------------+----------+-------------+-----------+--------+  Mid UA  150    0.68     0.39        +------------+----------+-------------+-----------+--------+  Dist UA     177    0.68     0.35        +------------+----------+-------------+-----------+--------+  AC Fossa    217    0.45     0.18        +------------+----------+-------------+-----------+--------+  Prox Forearm  167    0.72     0.26        +------------+----------+-------------+-----------+--------+  Mid Forearm   154    0.61     0.14         +------------+----------+-------------+-----------+--------+  Dist Forearm794 / 523  0.32 / 0.71 0.22 / 0.16      +------------+----------+-------------+-----------+--------+        Summary:  Patent arteriovenous fistula with no visualized stenosis.   Incision:  Well healed incisions Extremities:  Sensation, motor and grip intact and equal B UE.  Palpable thrill throughout the fistula    Assessment/Plan:  This is a 35 y.o. male who is s/p:single-stage basilic vein transposition fistula in the forearm.  The Fistula has matured well and he has no symptoms of steal the fistula should be ready for use on 06/15/21.        Roxy Horseman PA-C [facility] [phone]   Clinic MD:  Trula Slade

## 2021-05-01 DIAGNOSIS — N185 Chronic kidney disease, stage 5: Secondary | ICD-10-CM | POA: Diagnosis not present

## 2021-05-06 DIAGNOSIS — N185 Chronic kidney disease, stage 5: Secondary | ICD-10-CM | POA: Diagnosis not present

## 2021-05-06 DIAGNOSIS — I770 Arteriovenous fistula, acquired: Secondary | ICD-10-CM | POA: Diagnosis not present

## 2021-05-06 DIAGNOSIS — N2581 Secondary hyperparathyroidism of renal origin: Secondary | ICD-10-CM | POA: Diagnosis not present

## 2021-05-06 DIAGNOSIS — D631 Anemia in chronic kidney disease: Secondary | ICD-10-CM | POA: Diagnosis not present

## 2021-05-06 DIAGNOSIS — E872 Acidosis: Secondary | ICD-10-CM | POA: Diagnosis not present

## 2021-05-06 DIAGNOSIS — I120 Hypertensive chronic kidney disease with stage 5 chronic kidney disease or end stage renal disease: Secondary | ICD-10-CM | POA: Diagnosis not present

## 2021-05-08 ENCOUNTER — Ambulatory Visit: Payer: Medicaid Other | Attending: Critical Care Medicine | Admitting: Critical Care Medicine

## 2021-05-08 ENCOUNTER — Encounter: Payer: Self-pay | Admitting: Critical Care Medicine

## 2021-05-08 ENCOUNTER — Other Ambulatory Visit: Payer: Self-pay

## 2021-05-08 VITALS — BP 172/92 | HR 86 | Resp 16 | Wt 121.6 lb

## 2021-05-08 DIAGNOSIS — E1065 Type 1 diabetes mellitus with hyperglycemia: Secondary | ICD-10-CM

## 2021-05-08 DIAGNOSIS — N184 Chronic kidney disease, stage 4 (severe): Secondary | ICD-10-CM

## 2021-05-08 DIAGNOSIS — I10 Essential (primary) hypertension: Secondary | ICD-10-CM | POA: Diagnosis not present

## 2021-05-08 DIAGNOSIS — E1022 Type 1 diabetes mellitus with diabetic chronic kidney disease: Secondary | ICD-10-CM | POA: Diagnosis not present

## 2021-05-08 DIAGNOSIS — I69320 Aphasia following cerebral infarction: Secondary | ICD-10-CM | POA: Diagnosis not present

## 2021-05-08 DIAGNOSIS — E103599 Type 1 diabetes mellitus with proliferative diabetic retinopathy without macular edema, unspecified eye: Secondary | ICD-10-CM

## 2021-05-08 LAB — GLUCOSE, POCT (MANUAL RESULT ENTRY): POC Glucose: 130 mg/dl — AB (ref 70–99)

## 2021-05-08 NOTE — Assessment & Plan Note (Signed)
Hypertension difficult control currently blood pressure acceptable by nephrology no change in medicines

## 2021-05-08 NOTE — Assessment & Plan Note (Signed)
This is markedly improved

## 2021-05-08 NOTE — Patient Instructions (Signed)
No change in medications  We will ensure you have enough refills on her medications  Continue to follow a low-salt diet  Please obtain Debrox eardrops for the ears to get the wax out of the ears both ears have some wax actually the left ear is worse than the right  Keep your other follow-up appointments and return to see Dr. Joya Gaskins in 4 months   Earwax Buildup, Adult The ears produce a substance called earwax that helps keep bacteria out of the ear and protects the skin in the ear canal. Occasionally, earwax can build up in the ear and cause discomfort or hearing loss. What are the causes? This condition is caused by a buildup of earwax. Ear canals are self-cleaning. Ear wax is made in the outer part of the ear canal and generally falls out in small amounts over time. When the self-cleaning mechanism is not working, earwax builds up and can cause decreased hearing and discomfort. Attempting to clean ears with cotton swabs can push the earwax deep into the ear canal and cause decreased hearing and pain. What increases the risk? This condition is more likely to develop in people who:  Clean their ears often with cotton swabs.  Pick at their ears.  Use earplugs or in-ear headphones often, or wear hearing aids. The following factors may also make you more likely to develop this condition:  Being male.  Being of older age.  Naturally producing more earwax.  Having narrow ear canals.  Having earwax that is overly thick or sticky.  Having excess hair in the ear canal.  Having eczema.  Being dehydrated. What are the signs or symptoms? Symptoms of this condition include:  Reduced or muffled hearing.  A feeling of fullness in the ear or feeling that the ear is plugged.  Fluid coming from the ear.  Ear pain or an itchy ear.  Ringing in the ear.  Coughing.  Balance problems.  An obvious piece of earwax that can be seen inside the ear canal. How is this diagnosed? This  condition may be diagnosed based on:  Your symptoms.  Your medical history.  An ear exam. During the exam, your health care provider will look into your ear with an instrument called an otoscope. You may have tests, including a hearing test. How is this treated? This condition may be treated by:  Using ear drops to soften the earwax.  Having the earwax removed by a health care provider. The health care provider may: ? Flush the ear with water. ? Use an instrument that has a loop on the end (curette). ? Use a suction device.  Having surgery to remove the wax buildup. This may be done in severe cases. Follow these instructions at home:  Take over-the-counter and prescription medicines only as told by your health care provider.  Do not put any objects, including cotton swabs, into your ear. You can clean the opening of your ear canal with a washcloth or facial tissue.  Follow instructions from your health care provider about cleaning your ears. Do not overclean your ears.  Drink enough fluid to keep your urine pale yellow. This will help to thin the earwax.  Keep all follow-up visits as told. If earwax builds up in your ears often or if you use hearing aids, consider seeing your health care provider for routine, preventive ear cleanings. Ask your health care provider how often you should schedule your cleanings.  If you have hearing aids, clean them according to  instructions from the manufacturer and your health care provider.   Contact a health care provider if:  You have ear pain.  You develop a fever.  You have pus or other fluid coming from your ear.  You have hearing loss.  You have ringing in your ears that does not go away.  You feel like the room is spinning (vertigo).  Your symptoms do not improve with treatment. Get help right away if:  You have bleeding from the affected ear.  You have severe ear pain. Summary  Earwax can build up in the ear and cause  discomfort or hearing loss.  The most common symptoms of this condition include reduced or muffled hearing, a feeling of fullness in the ear, or feeling that the ear is plugged.  This condition may be diagnosed based on your symptoms, your medical history, and an ear exam.  This condition may be treated by using ear drops to soften the earwax or by having the earwax removed by a health care provider.  Do not put any objects, including cotton swabs, into your ear. You can clean the opening of your ear canal with a washcloth or facial tissue. This information is not intended to replace advice given to you by your health care provider. Make sure you discuss any questions you have with your health care provider. Document Revised: 04/03/2020 Document Reviewed: 04/03/2020 Elsevier Patient Education  [place].

## 2021-05-08 NOTE — Assessment & Plan Note (Signed)
Stage IV 5 chronic kidney disease he is heading towards dialysis but also is hopeful for potential renal transplant in the future

## 2021-05-08 NOTE — Progress Notes (Signed)
Subjective:    Patient ID: George [name], male    DOB: [birth year], 35 y.o.   MRN: [id number] History of Present Illness: This is a 35 year old male has had previous history of cerebral infarction, type 1 diabetes, hypertension, tobacco use, supraventricular tachycardia, chronic kidney disease.  Since the last visit the patient has improved somewhat.  Labs did come back showing normal thyroid function and cholesterol at goal.  The patient's blood sugars have been reasonably well-controlled running fasting levels in the 1 20-1 60 range.  Note today the CBG is 180 blood pressure has come down but still not yet at goal today 160/112  The patient has yet to see speech pathology for his aphasia.  The barrier there was the patient did not have insurance and I indicated to the scheduler the patient can be self-pay  The patient has been able to reduce tobacco use down to 3 to 5 cigarettes daily and has yet to pick up the nicotine lozenges   09/19/2019 This is a follow-up visit for this 35 year old male with type 1 diabetes, hypertension, tobacco use, chronic kidney disease, and previous history of cerebral infarction.  The patient states apart from some fatigue he is doing fairly well.  He is now smoking 2 packs of cigarettes a week.  His blood pressure at home ranges been 130-135/80-85 range.  His blood sugars have been in the 1 20-1 50s with average is 1 30-1 40 on his meter.  His sugar will go down as low as 60s before dinner.  The patient does have history of receptive a aphasia and notes when he is playing his video games he will remember some names but not all the names of the characters in the game.  He has difficulty with memory retrieval.  He saw speech pathology 1 time and has not yet made a follow-up visit.    Patient did see nephrology and has a follow-up visit pending.  At the last visit the patient not receive any labs.  The patient denies any chest pain or shortness of  breath.  12/21: Since the last visit the patient was hospitalized 4 days after our visit in September.  The patient had diabetic ketoacidosis and also new areas of stroke.  Below is the discharge summary from that hospitalization  [name] a 35 y.o.malewith medical history significant oftype 1 diabetes, hypertension, recent CVA in June with combined receptive and expressive aphasia, CKD stage III who presented here with altered mental status and reportedly has been having nausea and persistent vomiting since Wednesday. Reportedly from EDP patient was initially able to give some history but acutely became more agitated and was pulling out his IV. He was tachycardic up to 160s and had improvement of heart rate down to 120s with 10 mg of verapamil. He was also tachypneic but remained on room air. Patient was restless and agitated at the time my evaluation and per nursing report he had attempted to use his foot to remove his hand restraints. He repeatedly stated "please help me," but was unable to explain any of his symptoms to me. He was pan positive and replied yes to every review of systems questions. Also stating he has pain everywhere. When asked for his full name and where he was he only stated "[place]."  He was afebrile and hypertensive up to 170s/108. In sinus tachycardia up to 160 and mildly tachypneic on room air. CBC showed leukocytosis of 29.6 and no anemia. CMP showed glucose level 532 with  bicarb of 18 and anion gap of 24. pH was mildly alkalotic at 7.59. Lactic acid of 5 and then 3.2 following IV fluids. TSH normal. COVID testing negative. CT head shows 2 or 3 rounded oval regions of low attenuation in the cerebral hemisphere not seen on previous studies. Suggest could be small interval lacunar infarcts with the possibility of embolic disease. Findings were age-indeterminate. CT abdomen showed no acute findings. There is gallbladder sludge or stones but no findings  of acute cholecystitis.  He was seen for nausea, vomiting, and altered mental status.  He was found to be in DKA.  He improved with insulin gtt and was transitioned to his home regimen.  He had head imaging and was found to have new strokes.  He was seen by neurology who recommended DAPT x 21 days then plavix alone.  Hospitalization also c/b AKI on CKD improving at time of discharge.  Hospital Course:  Diabetic ketoacidosis  T1DM Hypoglycemia - Unclear precipitating cause.  - He's had borderline temps, tmax of 100.2 since admission, but clinically appears to be improving and UA and CXR not suggestive of infection - follow blood cx (gram positive rods in 1/2 blood cx - repeat cx from 9/27 ngtd)  and follow urine cx (urine did have WBC, but negative LE/nitrites - no growth) - hold off on abx for now - DKA now resolved, AG 13, bicarb 22, BG173 - resumehome insulin at reduced dose 8units NPH with 3 units at meals and SSI(he's had some hypoglycemia here and he notes some at home as well - discussed importance of f/u with Dr. Joya Gaskins and monitoring BG at home) - Diabetic coordinator - A1c is 7.3 - check HbA1C  Altered mental status/delirium -delirium precautions - utox positive for MJ -resolved  Acute Infarct R Parietal Cortex  Subacute Infarct R Cerebellum  Hx CVA: -Hx L PCA infarct in June - Was on ASA/plavix and supposed to stop plavix and continue ASA alone after 9/19 (admitting provider started on plavix- have added aspirin, will deferfinal planto neuro) - Thought 2/2 cardioembolic source -> they'd planned for 30 day event monitor as echo and TEE were negative for PFO or idenifiable cardiac source of embolus and hypercoagulable/vasculitis w/u was negative per recent neuro note - will reconsult neurology in setting of acute stroke, appreciate recs- recurrent cryptogenic strokes - recommend ASA and plavix x3 weeks then plavix alone.  Of note, discharged with 81 mg ASA with  plavix, but on review of neuro note - intended to be 325 mg aspirin with plavix x21 days then plavix alone.  Called mother to correct this after discharge - she expressed understanding.  Hypertension  Tachycardia Significantly elevated BP's this AM His clonidine and coreg were being held -> which likely contributed to rebound hypertension and tachycardia Resume clonidine and coreg and amlodipine - BP still significantly elevated - will add hydralazine at d/c to help gradually normalize BP after stroke - needs close follow up with Dr. Joya Gaskins EKG with sinus tachycardia  Acute kidney injury -Creatinine of 4.11 on admission - improved to 3.02 today - good UOP - baseline creatinine ~2.2-2.4 - renal US without hydro, distended bladder noted(per nursing 9/26, bladder scan showed around 500 cc, then he voided as much) - creatinine improving - follow with nephrology as outpatient -Continue to monitor serial BMP -Avoidnephrotoxic agent  Leukocytosis: improving, follow, follow cx- given sludge vs stone in gallbladder on imaging, will follow RUQ [place] as he did have N/V on presentation - RUQ  wnl  Gram Positive Rods in 1/2 Cx: suspect contaminant, improving off abx - follow repeat cx (NGTD)  In their interim the patient's had no additional episodes of fever or DKA.  His glycemic control has been such that he occasionally runs in the 70 range early in the morning but will rebound over 200 late in the day.  It is been difficult to control with the use of long-acting NPH twice daily 8 units in the morning 12 units in the evening at meals and then associate that with short acting Humalog  The patient has since followed up with cardiology with an event monitor showing no evidence of arrhythmia therefore no evidence for embolic event.  Neurology has seen the patient and recommended switch from atorvastatin to Crestor discontinuance of a full dose aspirin after 21 days and continuing Plavix for  life  Blood pressure control continue with amlodipine clonidine and Coreg The patient also has follow-up with [facility] scheduled creatinine is in the 3 range Also an ophthalmology visit was obtained and there is apparently blood in the eyes with diabetic retinopathy  The patient does need a retinal eye referral  1/25  Since the last visit in December the patient required another admission for recurrent stroke in the cerebellar area.  The patient had ongoing nausea and went back into diabetic ketoacidosis as well.  Below is a copy of the discharge summary.  There is a follow-up appointment with nephrology upcoming and the endocrinology appointment but out until September therefore we need to intervene on that to get an earlier appointment  Also neurology appointment is pending from the hospitalization  Below is a copy of the discharge summary  Note this is a transition of care visit and below the discharge summary is the nurse case manager note    d/c summar y: Date of Admission: 01/11/2020                      Date of Discharge: 01/13/20  Admitting Physician: Lyndee Hensen, DO  Primary Care Provider: Elsie Stain, MD Consultants: Neurology   Indication for Hospitalization: DKA  Discharge Diagnoses/Problem List:  DKA in T1DM Fever Hematemesis Non-intractable nausea and vomiting Subacute CVA Acute on chronic CKD stage IV Anemia GERD  Disposition: Home   Discharge Condition: Improved   Discharge Exam:   BMW:UXLKG and conversational, in no acute distress CV: regular rate and rhythm, no murmurs appreciated RESP: no increased work of breathing, clear to ascultation bilaterally with no crackles, wheezes, or rhonchi  ABD: Bowel sounds present. Soft, Nontender, Nondistended.  MSK: no edema, or cyanosis noted SKIN: warm, dry NEURO: grossly normal, moves all extremities appropriately, alert and oriented PSYCH:Normal affect and thought content  Brief  Hospital Course:   Type 1 diabetes  nausea vomiting  DKA  hematemesis Trip Cavanagh is a 35 y.o. male presented after 3 days of vomiting that progressed to bloody emesis which prompted patient to seek care.  Mom reported patient had CBGs ranging 120-320.  On arrival, patient tachycardic (HR120s) and hypertensive (SBP 170s).  Labs concerning for mild DKA with BHB 3.73,CBG300,bicarb17, pH 7.4with elevated anion gap of 24.  He was given 2 L bolus in the ED. DKA protocol initiated and patient started on insulin drip and IV potassium.    Patient was transitioned to SQ insulin on 01/12/20.  Etiology of DKA unclear however patient did have of subacute stroke on CT head which could have been the precipitating factor for his  DKA. Overall, Pt's diabetes is controlled as A1c was 7.2.  Mom and patient wanted to start the hospital regimen of Lantus and NovoLog in the outpatient setting.  Patient was discharged with 15 Lantus and 3 units (Novolog) mealtime coverage.  Patient has a pending referral for [facility] endocrinology.   Acute encephalopathy   subacute CVA Perfecto had inappropriate answers to orientation questions (What year is it "8th",Whats your name "august 12th",Where are you " working on it") thus CT head was obtained.  CT head indicated 2 cm subacute infarct in the cerebellum that was not present on previous imaging.  Patient with history of 2 ischemic strokes in the past 6 months.  Neurology was consulted and recommended dual antiplatelet therapy for the next 3 weeks followed by monotherapy with Plavix thereafter.  MRA head and neck obtained did not show any emergent findings of flow limiting stenosis.  Patient to follow-up with stroke clinic at [facility] neurology patient in 4 weeks.    Hematemesis Patient with vomiting that progressed to blood-tinged emesis over the past several days.  Suspect small Mallory-Weiss tear due to frequent retching.  For GI prophylaxis, Protonix infusion  initiated in the ED.   Hematemesis resolved on day 1 of admission.  Hemoglobin remained reassuring throughout his admission. He was transitioned to oral Protonix prior to discharge.   Hypertension Due to persistent vomiting, patient was unable to take antihypertensives.  On arrival SBP's were in the 170s.  Permissive hypertension was allowed for 72 hours for subacute stroke.    Acute on chronic CKD stage IV Creatinine on admission 4.24, likely prerenal in the setting of dehydration due to vomiting.  Creatinine returned to baseline prior to discharge.  Patient to follow up outpatient with [facility].  Fever Patient had isolated fever of 100.5 F on 01/12/20.  Leukocytosis (WBC 25.4) likely due to leukemoid reaction secondary to DKA or hemoconcentration secondary to dehydration.  Blood cultures have no growth to date and urine cultures had insignificant growth.  Lactic acid was reassuring at 0.7.  No empiric antibiotics were started.   Issues for Follow Up:  1. Continue titrating patient's basal (Lantis) and short-acting insulin (Novolog).  2. Patient with permissive hypertension for the next few days.  We discontinued Clonidine. If needed, consider clonidine patch instead tablets. He should resume all other antihypertensive medications. 3. UDS was positive for THC.  Continue talking with the patient about the risk of THC use and stroke.  TCM note from CM RN: Note  Transition Care Management Follow-up Telephone Call  Call completed with patient's mother, Harriett.  DPR on file to speak with her.    Date of discharge and from where: 01/13/2020, [facility]   How have you been since you were released from the hospital? His mother reported that he is tired but doing well. Nausea has improved. She said that pharmacist recommended dramamine for the nausea and that has helped him and he is starting to eat again.   Any questions or concerns? No questions/ concerns at this time    Items Reviewed:  Did the pt receive and understand the discharge instructions provided? She has the instructions and has no questions.   Medications obtained and verified? yes, he has all medications including the new meds.  His mother is aware of the medications that have been discontinued.  She said that Saturday, 01/14/2020 he was " wiped out" and he attributed that to the lantus so yesterday, he split the lantus dose and took  7.5 units in the morning and 7.5 units in the evening.  She does realize that he may just be tired as a result of the hospitalization.   Any new allergies since your discharge? none reported   Do you have support at home? Lives with his parents  Other (ie: DME, Home Health, etc) no home health ordered.   Has glucometer, his mother said that his blood sugars usually run in  the low 200s  Functional Questionnaire: (I = Independent and D = Dependent) ADL's: independent, mother helps oversee medication regime   Follow up appointments reviewed:    PCP Hospital f/u appt confirmed? Appointment scheduled with Dr Joya Gaskins 01/23/2020 @ [facility] f/u appt confirmed? Nephrology- 01/25/2020, mother will call to schedule endocrinology and neurology appts  Are transportation arrangements needed? No, parents provide transportation  If their condition worsens, is the pt aware to call  their PCP or go to the ED?yes  Was the patient provided with contact information for the PCP's office or ED? Yes. His mother has the phone number for the clinic  Was the pt encouraged to call back with questions or concerns?yes  Since discharge the patient knows he does have a nephrology appointment coming up on 27 January but is still pending endocrine and neurology.  He states his nausea has resolved.  He also has yet to achieve a retinal appointment with Dr. Zigmund Daniel.  In looking at the patient's medication regimen it appears the NPH dosing he was on failed the patient  and he will need more consistent insulin dosing.  Note since discharge the patient has been on the Lantus dosing and this appears to have caused improved control of the diabetes and he is on a 15 unit daily dosing of this and 5 units 3 times daily of NovoLog at meals    04/03/2020>>no showed.  4/20 since last OV which actually occurred in January 2021 the patient has been to endocrinology and also neurology.  He has had prior history of recurrent strokes in the cerebellar area and plan previously had been to continue Plavix 75 mg daily and aspirin 81 mg daily for a total therapy of 3 weeks and neurology then saw the patient in February:  From that visit the following recommendations were given by neurology   1. Multiple recurrent strokes:  Continue clopidogrel 75 mg daily and Crestor 20 mg daily for secondary stroke prevention. Maintain strict control of hypertension with blood pressure goal below 130/90, diabetes with hemoglobin A1c goal below 6.5% and cholesterol with LDL cholesterol (bad cholesterol) goal below 70 mg/dL.  I also advised the patient to eat a healthy diet with plenty of whole grains, cereals, fruits and vegetables, exercise regularly with at least 30 minutes of continuous activity daily and maintain ideal body weight. 2. HTN: Advised to continue current treatment regimen.  Encouraged to monitor blood pressure at home routinely to ensure satisfactory management.  Advised him to follow-up with cardiology or PCP for ongoing management and monitoring 3. HLD: Continue Crestor 20 mg daily and ongoing monitoring management by PCP 4. DMI: Advised to continue to monitor glucose levels at home along with continued follow-up with endocrinology for management and monitoring 5. CKD stage III: Continue to follow nephrology as scheduled 6. Visual impairment: post versus diabetic retinopathy.  Highly encouraged ensuring evaluation by retina specialist for further evaluation 7. Tobacco/THC use:  Discussion with patient regarding importance of cessation due to increased risk of reoccurring stroke and cardiac disease with ongoing  use.  He verbalized understanding.  Note the patient does have a retinal specialist visit scheduled in May.  Patient also has follow-up with renal in May along with endocrinology as well.  Patient notes his blood pressures at home in the 130 over 90s range but here in the office remain elevated.  His blood glucoses often are as low as 93-100 but after eating and her first thing in the morning they can run as high as 380.  He appears to be skipping his evening doses of NovoLog.  He is concerned about with his gastroparesis he may become low but his low sugars only been in the low 80s.  The patient continues to smoke a cigarette air or 2 every 2 days.  He knows he needs to stop smoking completely  At the February endocrine visit the following recommendations were given   I explained to the patient that diabetic patients are at higher than normal risk for amputations.  His A1c is falsely skewed downwards due to CKD  I have praised the patient on frequent glucose checks and having that data available to me today, and review of his meter download he is noted to have fluctuating BG's ranging from severe hyperglycemia to hypoglycemia, patient admits to dietary indiscretions, he also tends to snack sometimes without a prandial coverage patient also tends to bolus after a meal, patient also tends to get sometimes on the correction dose, we discussed the risk of hypoglycemia especially in the setting of CKD 4.   I have recommended that he takes 2 units of NovoLog with snacks, I have explained to him that if he eats anything with carbohydrates and no prandial coverage he will have hypoglycemia.  I will also give him a correction scale so he does not guess on a correction dose.  I have recommended the following adjustments to his regimen,   MEDICATIONS: Continue lantus 17  units daily Novolog 5 units with each meal NovoLog 2 units with a snack CF: NovoLog (BG-130/55)   EDUCATION / INSTRUCTIONS:  BG monitoring instructions: Patient is instructed to check his blood sugars 4 times a day, before meals and bedtime.  Call [facility] if: BG persistently < 70 or > 300.  I reviewed the Rule of 15 for the treatment of hypoglycemia in detail with the patient. Literature supplied.   2) Diabetic complications:   Eye: Does  have known diabetic retinopathy.   Neuro/ Feet: Does night have known diabetic peripheral neuropathy.  Renal: Patient does  have known baseline CKD. He is not on an ACEI/ARB at present.  3) Lipids: Patient is on a statin.    4) Hypertension: He is above goal of < 140/90 mmHg.  Will defer further management to his PCP   Note today his CBG is 144 today 144 Note the patient did receive his Covid vaccine series with Pfizer ending April 13  07/25/2020 Since the last visit the patient has been doing fairly well.  His glycemic control is improved he has seen renal and his renal function remains stage IV CKD with GFR in the low 20s.  He is still smoking an occasional 1 cigarette periodically when he is with his girlfriend.  He has received 3 injections in the to treat diabetic retinopathy at [facility].  His vision has improved with these treatments.  Hemoglobin A1c last checked in May was 7.3.  Blood pressure control has improved as well at this time  01/08/21 Patient returns today in follow-up  last visit was in July 2021. The patient's blood pressure on arrival was elevated but on recheck 127/77.  Patient continues to have a early hypoglycemias in the evening he dropped to the 114 range.  On arrival today hemoglobin A1c is 7.0.  Blood sugars 174 on arrival.  Patient has upcoming visit with endocrinology.  He is interested now in getting a continuous glucose monitor whereas he was not before.  He states his vision is better  with retinopathy treatments.  He has been referred to renal transplant clinic at [facility] at this time.  He is no longer smoking cigarettes for the past 6 months.  Patient is fully vaccinated for COVID  05/08/2021 This patient is seen in return follow-up note since the last visit patient's been seen by nephrology he is at stage IV/V renal failure now has a vascular access in the left arm.  Patient states he is at 14% renal function.  On arrival blood pressure 172/92.  A1c recently was 5.8 and his insulin dose had been adjusted by endocrinology.  He now finally has a Dexcom continuous monitoring device.  Patient has no real complaints at this time.  He has had his eyes examined and followed up upon.  He has been to [facility] for potential renal transplant evaluation.  Patient is on maximum doses of antihypertensives at this time and cannot tolerate or take an ARB or ACE inhibitor because of renal function  Past Medical History:  Diagnosis Date  . AKI (acute kidney injury) ([place])   . Cerebral infarction ([place])   . Hypertension   . Neurologic deficit due to acute ischemic cerebrovascular accident (CVA) ([place]) 06/13/2019  . Supraventricular tachycardia ([place])   . Type 1 diabetes ([facility])      Family History  Problem Relation Age of Onset  . Hypertension Mother   . Diabetes Mother   . Hyperlipidemia Mother   . Diabetes Father      Social History   Socioeconomic History  . Marital status: Single    Spouse name: Not on file  . Number of children: Not on file  . Years of education: Not on file  . Highest education level: Not on file  Occupational History  . Not on file  Tobacco Use  . Smoking status: Former Smoker    Packs/day: 0.25    Types: Cigarettes    Start date: 12/29/2010    Quit date: 06/25/2020    Years since quitting: 0.8  . Smokeless tobacco: Never Used  Vaping Use  . Vaping Use: Former  . Start date: 12/30/2015  . Quit date: 12/29/2017  Substance and Sexual  Activity  . Alcohol use: Not on file    Comment: 6 beers/week  . Drug use: Not Currently    Types: Marijuana  . Sexual activity: Not Currently  Other Topics Concern  . Not on file  Social History Narrative  . Not on file   Social Determinants of Health   Financial Resource Strain: Not on file  Food Insecurity: Not on file  Transportation Needs: Not on file  Physical Activity: Not on file  Stress: Not on file  Social Connections: Not on file  Intimate Partner Violence: Not on file     No Known Allergies   Outpatient Medications Prior to Visit  Medication Sig Dispense Refill  . amLODipine (NORVASC) 10 MG tablet Take 1 tablet (10 mg total) by mouth daily. 90 tablet 2  . carvedilol (COREG) 25 MG tablet Take  1 tablet (25 mg total) by mouth 2 (two) times daily with a meal. 180 tablet 1  . Cholecalciferol (VITAMIN D3) 50 MCG (2000 UT) TABS Take 200 Units by mouth daily.    . clopidogrel (PLAVIX) 75 MG tablet Take 1 tablet (75 mg total) by mouth daily. 90 tablet 3  . Continuous Blood Gluc Sensor (DEXCOM G6 SENSOR) MISC Use as instructed to check blood sugar 3 times daily. 3 each 2  . Continuous Blood Gluc Transmit (DEXCOM G6 TRANSMITTER) MISC Inject 1 Device into the skin as directed. Use as instructed to check blood sugar 3 times daily. 1 each 3  . glucose blood (ONETOUCH VERIO) test strip Use as instructed to check blood sugar 3 times daily. 100 each 11  . hydrALAZINE (APRESOLINE) 100 MG tablet Take 1 tablet (100 mg total) by mouth 3 (three) times daily. 90 tablet 4  . insulin aspart (NOVOLOG FLEXPEN) 100 UNIT/ML FlexPen Inject 5 Units into the skin 3 (three) times daily with meals. Mac daily 30 units to include correction scale and snack coverage (Patient taking differently: Inject 5-30 Units into the skin See admin instructions. Mac daily 30 units to include correction scale and snack coverage three times a day with meals) 15 mL 3  . insulin glargine (LANTUS SOLOSTAR) 100 UNIT/ML  Solostar Pen Inject 18 Units into the skin daily. (Patient taking differently: Inject 16 Units into the skin daily.) 15 mL 11  . Insulin Pen Needle (PEN NEEDLES 3/16") 31G X 5 MM MISC To inject insulin with novolog flex pen, 3 times daily with meals. 100 each 4  . pantoprazole (PROTONIX) 40 MG tablet Take 1 tablet (40 mg total) by mouth daily. For stomach protection 30 tablet 11  . rosuvastatin (CRESTOR) 20 MG tablet Take 1 tablet (20 mg total) by mouth daily. 30 tablet 4  . sodium bicarbonate 650 MG tablet Take 650 mg by mouth 2 (two) times daily.    [name] 1 GM 210 MG(Fe) tablet Take 210 mg by mouth 3 (three) times daily. (Patient not taking: Reported on 05/08/2021)    . ferrous sulfate 325 (65 FE) MG tablet Take 325 mg by mouth daily with breakfast. (Patient not taking: Reported on 05/08/2021)    . oxyCODONE-acetaminophen (PERCOCET) 10-325 MG tablet Take 1 tablet by mouth every 6 (six) hours as needed for pain. (Patient not taking: Reported on 05/08/2021) 20 tablet 0   No facility-administered medications prior to visit.    Review of Systems  Constitutional: Negative for activity change, appetite change, chills, diaphoresis, fatigue, fever and unexpected weight change.  HENT: Positive for hearing loss, postnasal drip and rhinorrhea. Negative for sore throat.   Eyes: Negative for visual disturbance.  Respiratory: Negative.   Cardiovascular: Negative.  Negative for palpitations.  Gastrointestinal: Negative for constipation.  Endocrine: Negative.   Genitourinary: Negative.   Musculoskeletal: Negative.   Neurological: Negative for dizziness, tremors, seizures, syncope, facial asymmetry, speech difficulty, light-headedness, numbness and headaches.  Hematological: Negative for adenopathy. Does not bruise/bleed easily.  Psychiatric/Behavioral: Negative.        Objective:   Physical Exam Vitals:   05/08/21 1107  BP: (!) 172/92  Pulse: 86  Resp: 16  SpO2: 97%  Weight: 121 lb 9.6 oz  (55.2 kg)   Gen: Pleasant, well-nourished, in no distress,  normal affect  ENT: Moderate cerumen in both ears,  mouth clear,  oropharynx clear, no postnasal drip, poor dentition  Neck: No JVD, no TMG, no carotid bruits  Lungs: No use  of accessory muscles, no dullness to percussion, clear without rales or rhonchi  Cardiovascular: RRR, heart sounds normal, no murmur or gallops, no peripheral edema  Abdomen: soft and NT, no HSM,  BS normal  Musculoskeletal: No deformities, no cyanosis or clubbing  Neuro: alert, non focal  Skin: Warm, no lesions or rashes   BMP Latest Ref Rng & Units 03/15/2021 04/17/2020 01/23/2020  Glucose 70 - 99 mg/dL 161(H) 77 114(H)  BUN 6 - 20 mg/dL 46(H) 26(H) 35(H)  Creatinine 0.61 - 1.24 mg/dL 5.90(H) 3.17(H) 3.25(H)  BUN/Creat Ratio 9 - 20 - 8(L) 11  Sodium 135 - 145 mmol/L 138 142 142  Potassium 3.5 - 5.1 mmol/L 4.3 4.6 4.9  Chloride 98 - 111 mmol/L 103 103 103  CO2 20 - 29 mmol/L - 21 23  Calcium 8.7 - 10.2 mg/dL - 10.0 9.6   CBC Latest Ref Rng & Units 03/15/2021 04/17/2020 01/23/2020  WBC 3.4 - 10.8 x10E3/uL - 15.2(H) 11.7(H)  Hemoglobin 13.0 - 17.0 g/dL 9.5(L) 12.7(L) 14.5  Hematocrit 39.0 - 52.0 % 28.0(L) 37.3(L) 43.4  Platelets 150 - 450 x10E3/uL - 222 348       Assessment & Plan:  I personally reviewed all images and lab data in the [facility] system as well as any outside material available during this office visit and agree with the  radiology impressions.   Type 1 diabetes mellitus with hyperglycemia (HCC) Type 1 diabetes now A1c at goal 5.8 appreciate endocrinology's help patient's had insulin adjusted per endocrinology now has a Dexcom monitor  Type 1 diabetes mellitus with stage 4 chronic kidney disease ([place]) Stage IV 5 chronic kidney disease he is heading towards dialysis but also is hopeful for potential renal transplant in the future  HTN (hypertension) Hypertension difficult control currently blood pressure acceptable by nephrology no  change in medicines  Proliferative diabetic retinopathy associated with type 1 diabetes mellitus [facility]) Patient has ophthalmology follow-up  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) This is markedly improved   Harshaan was seen today for diabetes and hypertension.  Diagnoses and all orders for this visit:  Type 1 diabetes mellitus with hyperglycemia (HCC) -     POCT glucose (manual entry)  Type 1 diabetes mellitus with stage 4 chronic kidney disease ([place])  Primary hypertension  Proliferative diabetic retinopathy associated with type 1 diabetes mellitus, unspecified laterality, unspecified proliferative retinopathy type ([place])  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)    Patient has upcoming dental appointment  I spent 31 minutes with this patient reviewing prior records of her other consultants formulating a complex medical decision making plan refilling medications composing this note

## 2021-05-08 NOTE — Assessment & Plan Note (Signed)
Patient has ophthalmology follow-up

## 2021-05-08 NOTE — Assessment & Plan Note (Signed)
Type 1 diabetes now A1c at goal 5.8 appreciate endocrinology's help patient's had insulin adjusted per endocrinology now has a Dexcom monitor

## 2021-05-09 MED ORDER — AURYXIA 1 GM 210 MG(FE) PO TABS: 210.0000 mg | ORAL_TABLET | Freq: Three times a day (TID) | ORAL | Status: DC

## 2021-05-09 NOTE — Addendum Note (Signed)
Addended by: Asencion Noble E on: 05/09/2021 09:40 AM   Modules accepted: Orders

## 2021-07-09 DIAGNOSIS — N185 Chronic kidney disease, stage 5: Secondary | ICD-10-CM | POA: Diagnosis not present

## 2021-07-22 DIAGNOSIS — I770 Arteriovenous fistula, acquired: Secondary | ICD-10-CM | POA: Diagnosis not present

## 2021-07-22 DIAGNOSIS — N2581 Secondary hyperparathyroidism of renal origin: Secondary | ICD-10-CM | POA: Diagnosis not present

## 2021-07-22 DIAGNOSIS — N185 Chronic kidney disease, stage 5: Secondary | ICD-10-CM | POA: Diagnosis not present

## 2021-07-22 DIAGNOSIS — D631 Anemia in chronic kidney disease: Secondary | ICD-10-CM | POA: Diagnosis not present

## 2021-07-22 DIAGNOSIS — I120 Hypertensive chronic kidney disease with stage 5 chronic kidney disease or end stage renal disease: Secondary | ICD-10-CM | POA: Diagnosis not present

## 2021-07-22 DIAGNOSIS — E1022 Type 1 diabetes mellitus with diabetic chronic kidney disease: Secondary | ICD-10-CM | POA: Diagnosis not present

## 2021-07-22 DIAGNOSIS — R809 Proteinuria, unspecified: Secondary | ICD-10-CM | POA: Diagnosis not present

## 2021-07-22 DIAGNOSIS — E872 Acidosis: Secondary | ICD-10-CM | POA: Diagnosis not present

## 2021-08-12 ENCOUNTER — Other Ambulatory Visit (HOSPITAL_COMMUNITY): Payer: Self-pay

## 2021-08-12 ENCOUNTER — Telehealth: Payer: Self-pay | Admitting: Pharmacy Technician

## 2021-08-12 NOTE — Telephone Encounter (Addendum)
Patient Advocate Encounter   Received notification from Cover My Meds that prior authorization for Dexcom is required.   PA submitted on 08/12/2021 Key [id number] (transmitter) Approved 08/12/21-08/12/22 case ID [id number] (sensor) Status is pending    [facility] will continue to follow:   Armanda Magic, CPhT Patient Advocate [facility] Phone: [phone] Fax:  [phone]

## 2021-08-14 NOTE — Telephone Encounter (Signed)
[facility] Patient Advocate Encounter  Prior Authorization for [facility] G6 TRANSMITTER has been approved.    PA# [id number]  Effective dates: 08/12/2021 through 08/12/2022      [facility] will continue to follow.   Ronney Asters, CPhT Patient Advocate [facility] Phone: [phone] Fax:  [phone]

## 2021-09-05 ENCOUNTER — Other Ambulatory Visit: Payer: Self-pay | Admitting: Critical Care Medicine

## 2021-09-05 ENCOUNTER — Other Ambulatory Visit (HOSPITAL_COMMUNITY): Payer: Self-pay

## 2021-09-06 NOTE — Telephone Encounter (Signed)
Requested medications are due for refill today yes  Requested medications are on the active medication list yes  Last refill 7/7  Last visit 04/2021, lab was from 03/2020  Future visit scheduled 09/09/21  Notes to clinic Failed protocol due to lab work more than 360 days ago, does have upcoming appt next week.

## 2021-09-07 NOTE — Progress Notes (Signed)
Established Patient Office Visit  Subjective:  Patient ID: George [name], male    DOB: 08-15-1986  Age: 35 y.o. MRN: [id number]  CC:  Chief Complaint  Patient presents with   Diabetes   Hypertension    HPI George [name] presents for follow-up of stage V renal failure secondary to type 1 diabetes and hypertension prior stroke hyperlipidemia tobacco use retinopathy.  This patient has been seen recently in July by nephrology and they are seeing him on an every 19-monthbasis he remains on calcium and phosphate control and blood pressure control.  He has an AV fistula in place is maturing well.  Patient's blood pressure has been sometimes problematic he maintains a low-salt diet he is on hydralazine Coreg and amlodipine.  Patient's vitamin D PTH calcium levels phosphorus levels were satisfactory in July.  Patient's hemoglobin is stable iron saturation 21% this is being monitored.  He has been seen by the transplant clinic for potential renal and pancreatic transplant.  Patient's been seen by ophthalmology for retinal disease and received more injections in the right eye.  Patient maintains his insulin with short acting and long-acting insulin and has endocrinology follow-up in October.  The patient is no longer smoking at this time is not drinking alcohol.  Patient does have a Dexcom monitor but was not able to achieve an insulin pump.  A1c recently was 5.8. The patient's aphasia is markedly improved.  He has no other complaints.  He agrees to receiving a pneumococcal vaccine and flu vaccine at this visit. Past Medical History:  Diagnosis Date   AKI (acute kidney injury) ([facility]    Cerebral infarction ([place]    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) 09/19/2019   Hypertension    Neurologic deficit due to acute ischemic cerebrovascular accident (CVA) ([place] 06/13/2019   Supraventricular tachycardia ([facility]    Type 1 diabetes ([facility]     Past  Surgical History:  Procedure Laterality Date   AV FISTULA PLACEMENT Left 03/15/2021   Procedure: LEFT UPPER EXTREMITY ARTERIOVENOUS (AV) FISTULA CREATION;  Surgeon: ERosetta Posner MD;  [place]  Service: Vascular;  Laterality: Left;   BUBBLE STUDY  06/20/2019   Procedure: BUBBLE STUDY;  Surgeon: RFay Records MD;  [facility];  Service: Cardiovascular;;   TEE WITHOUT CARDIOVERSION N/A 06/20/2019   Procedure: TRANSESOPHAGEAL ECHOCARDIOGRAM (TEE);  Surgeon: RFay Records MD;  [facility];  Service: Cardiovascular;  Laterality: N/A;    Family History  Problem Relation Age of Onset   Hypertension Mother    Diabetes Mother    Hyperlipidemia Mother    Diabetes Father     Social History   Socioeconomic History   Marital status: Single    Spouse name: Not on file   Number of children: Not on file   Years of education: Not on file   Highest education level: Not on file  Occupational History   Not on file  Tobacco Use   Smoking status: Former    Packs/day: 0.25    Types: Cigarettes    Start date: 12/29/2010    Quit date: 06/25/2020    Years since quitting: 1.2   Smokeless tobacco: Never  Vaping Use   Vaping Use: Former   Start date: 12/30/2015   Quit date: 12/29/2017  Substance and Sexual Activity   Alcohol use: Not on file    Comment: 6 beers/week   Drug use: Not Currently    Types: Marijuana  Sexual activity: Not Currently  Other Topics Concern   Not on file  Social History Narrative   Not on file   Social Determinants of Health   Financial Resource Strain: Not on file  Food Insecurity: Not on file  Transportation Needs: Not on file  Physical Activity: Not on file  Stress: Not on file  Social Connections: Not on file  Intimate Partner Violence: Not on file    Outpatient Medications Prior to Visit  Medication Sig Dispense Refill   AURYXIA 1 GM 210 MG(Fe) tablet Take 1 tablet (210 mg total) by mouth 3 (three) times daily. 270 tablet     Cholecalciferol (VITAMIN D3) 50 MCG (2000 UT) TABS Take 200 Units by mouth daily.     Continuous Blood Gluc Sensor (DEXCOM G6 SENSOR) MISC Use as instructed to check blood sugar 3 times daily. 3 each 2   Continuous Blood Gluc Transmit (DEXCOM G6 TRANSMITTER) MISC Inject 1 Device into the skin as directed. Use as instructed to check blood sugar 3 times daily. 1 each 3   glucose blood (ONETOUCH VERIO) test strip Use as instructed to check blood sugar 3 times daily. 100 each 11   sodium bicarbonate 650 MG tablet Take 650 mg by mouth 2 (two) times daily.     amLODipine (NORVASC) 10 MG tablet Take 1 tablet (10 mg total) by mouth daily. 90 tablet 2   carvedilol (COREG) 25 MG tablet Take 1 tablet (25 mg total) by mouth 2 (two) times daily with a meal. 180 tablet 1   clopidogrel (PLAVIX) 75 MG tablet Take 1 tablet (75 mg total) by mouth daily. 90 tablet 3   hydrALAZINE (APRESOLINE) 100 MG tablet Take 1 tablet (100 mg total) by mouth 3 (three) times daily. 90 tablet 4   insulin aspart (NOVOLOG FLEXPEN) 100 UNIT/ML FlexPen Inject 5 Units into the skin 3 (three) times daily with meals. Mac daily 30 units to include correction scale and snack coverage (Patient taking differently: Inject 5-30 Units into the skin See admin instructions. Mac daily 30 units to include correction scale and snack coverage three times a day with meals) 15 mL 3   insulin glargine (LANTUS SOLOSTAR) 100 UNIT/ML Solostar Pen Inject 18 Units into the skin daily. (Patient taking differently: Inject 16 Units into the skin daily.) 15 mL 11   Insulin Pen Needle (PEN NEEDLES 3/16") 31G X 5 MM MISC To inject insulin with novolog flex pen, 3 times daily with meals. 100 each 4   pantoprazole (PROTONIX) 40 MG tablet Take 1 tablet (40 mg total) by mouth daily. For stomach protection 30 tablet 11   rosuvastatin (CRESTOR) 20 MG tablet TAKE 1 TABLET(20 MG) BY MOUTH DAILY 30 tablet 4   No facility-administered medications prior to visit.    No Known  Allergies  ROS Review of Systems  Constitutional:  Negative for chills, diaphoresis and fever.  HENT:  Negative for congestion, ear discharge, ear pain, hearing loss, nosebleeds, sore throat and tinnitus.   Eyes:  Negative for photophobia and discharge.  Respiratory:  Negative for cough, shortness of breath, wheezing and stridor.        No excess mucus  Cardiovascular:  Negative for chest pain, palpitations and leg swelling.  Gastrointestinal:  Negative for abdominal pain, blood in stool, constipation, diarrhea, nausea and vomiting.  Endocrine: Negative for polydipsia.  Genitourinary:  Negative for dysuria, flank pain, frequency, hematuria and urgency.  Musculoskeletal:  Negative for back pain, myalgias and neck pain.  Skin:  Negative for rash.  Allergic/Immunologic: Negative for environmental allergies.  Neurological:  Negative for dizziness, tremors, seizures, weakness and headaches.  Hematological:  Does not bruise/bleed easily.  Psychiatric/Behavioral:  Negative for hallucinations and suicidal ideas. The patient is not nervous/anxious.   All other systems reviewed and are negative.    Objective:    Physical Exam Vitals reviewed.  Constitutional:      Appearance: Normal appearance. He is well-developed. He is not diaphoretic.  HENT:     Head: Normocephalic and atraumatic.     Nose: No nasal deformity, septal deviation, mucosal edema or rhinorrhea.     Right Sinus: No maxillary sinus tenderness or frontal sinus tenderness.     Left Sinus: No maxillary sinus tenderness or frontal sinus tenderness.     Mouth/Throat:     Pharynx: No oropharyngeal exudate.     Comments: Mild periodontal disease no dentures no cavities Eyes:     General: No scleral icterus.    Conjunctiva/sclera: Conjunctivae normal.     Pupils: Pupils are equal, round, and reactive to light.  Neck:     Thyroid: No thyromegaly.     Vascular: No carotid bruit or JVD.     Trachea: Trachea normal. No tracheal  tenderness or tracheal deviation.  Cardiovascular:     Rate and Rhythm: Normal rate and regular rhythm.     Chest Wall: PMI is not displaced.     Pulses: Normal pulses. No decreased pulses.     Heart sounds: Normal heart sounds, S1 normal and S2 normal. Heart sounds not distant. No murmur heard. No systolic murmur is present.  No diastolic murmur is present.    No friction rub. No gallop. No S3 or S4 sounds.  Pulmonary:     Effort: No tachypnea, accessory muscle usage or respiratory distress.     Breath sounds: No stridor. No decreased breath sounds, wheezing, rhonchi or rales.  Chest:     Chest wall: No tenderness.  Abdominal:     General: Bowel sounds are normal. There is no distension.     Palpations: Abdomen is soft. Abdomen is not rigid.     Tenderness: There is no abdominal tenderness. There is no guarding or rebound.  Musculoskeletal:        General: Normal range of motion.     Cervical back: Normal range of motion and neck supple. No edema, erythema or rigidity. No muscular tenderness. Normal range of motion.     Comments: Left AV fistula with graft in place  Lymphadenopathy:     Head:     Right side of head: No submental or submandibular adenopathy.     Left side of head: No submental or submandibular adenopathy.     Cervical: No cervical adenopathy.  Skin:    General: Skin is warm and dry.     Coloration: Skin is not pale.     Findings: No rash.     Nails: There is no clubbing.  Neurological:     Mental Status: He is alert and oriented to person, place, and time.     Sensory: No sensory deficit.  Psychiatric:        Speech: Speech normal.        Behavior: Behavior normal.    BP (!) 153/65   Pulse 94   Resp 16   Wt 119 lb (54 kg)   SpO2 96%   BMI 21.08 kg/m  Wt Readings from Last 3 Encounters:  09/09/21 119 lb (54 kg)  05/08/21  121 lb 9.6 oz (55.2 kg)  04/29/21 121 lb 1.6 oz (54.9 kg)     Health Maintenance Due  Topic Date Due   Pneumococcal Vaccine  10-70 Years old (2 - PCV) 07/17/2020    There are no preventive care reminders to display for this patient.  Lab Results  Component Value Date   TSH 0.824 01/12/2020   Lab Results  Component Value Date   WBC 15.2 (H) 04/17/2020   HGB 9.5 (L) 03/15/2021   HCT 28.0 (L) 03/15/2021   MCV 89 04/17/2020   PLT 222 04/17/2020   Lab Results  Component Value Date   NA 138 03/15/2021   K 4.3 03/15/2021   CO2 21 04/17/2020   GLUCOSE 161 (H) 03/15/2021   BUN 46 (H) 03/15/2021   CREATININE 5.90 (H) 03/15/2021   BILITOT 0.2 04/17/2020   ALKPHOS 106 04/17/2020   AST 15 04/17/2020   ALT 15 04/17/2020   PROT 6.7 04/17/2020   ALBUMIN 4.8 04/17/2020   CALCIUM 10.0 04/17/2020   ANIONGAP 6 01/13/2020   Lab Results  Component Value Date   CHOL 118 04/17/2020   Lab Results  Component Value Date   HDL 47 04/17/2020   Lab Results  Component Value Date   LDLCALC 55 04/17/2020   Lab Results  Component Value Date   TRIG 81 04/17/2020   Lab Results  Component Value Date   CHOLHDL 2.5 04/17/2020   Lab Results  Component Value Date   HGBA1C 5.8 (A) 04/15/2021      Assessment & Plan:   Problem List Items Addressed This Visit       Cardiovascular and Mediastinum   HTN (hypertension)    Systolic pressure slightly high diastolic adequate at 65 plan is to continue current medications no changes per nephrology      Relevant Medications   amLODipine (NORVASC) 10 MG tablet   carvedilol (COREG) 25 MG tablet   hydrALAZINE (APRESOLINE) 100 MG tablet   rosuvastatin (CRESTOR) 20 MG tablet     Endocrine   Hyperlipidemia due to type 1 diabetes mellitus (HCC)    Continue current statins      Relevant Medications   amLODipine (NORVASC) 10 MG tablet   carvedilol (COREG) 25 MG tablet   hydrALAZINE (APRESOLINE) 100 MG tablet   insulin aspart (NOVOLOG FLEXPEN) 100 UNIT/ML FlexPen   insulin glargine (LANTUS SOLOSTAR) 100 UNIT/ML Solostar Pen   rosuvastatin (CRESTOR) 20 MG tablet    Proliferative diabetic retinopathy associated with type 1 diabetes mellitus ([place])    Follow-up per ophthalmology      Relevant Medications   insulin aspart (NOVOLOG FLEXPEN) 100 UNIT/ML FlexPen   insulin glargine (LANTUS SOLOSTAR) 100 UNIT/ML Solostar Pen   rosuvastatin (CRESTOR) 20 MG tablet   Type 1 diabetes mellitus with hyperglycemia (HCC) - Primary    No change in insulin dosing keep appointment with endocrinology      Relevant Medications   insulin aspart (NOVOLOG FLEXPEN) 100 UNIT/ML FlexPen   insulin glargine (LANTUS SOLOSTAR) 100 UNIT/ML Solostar Pen   rosuvastatin (CRESTOR) 20 MG tablet   Other Relevant Orders   POCT glucose (manual entry) (Completed)     Genitourinary   Chronic renal disease, stage V (HCC)    Stage V chronic kidney disease controlled at this time hypertensive renal disease  Follow-up per nephrology        Other   RESOLVED: 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)    This has resolved  Other Visit Diagnoses     Essential hypertension       Relevant Medications   amLODipine (NORVASC) 10 MG tablet   carvedilol (COREG) 25 MG tablet   hydrALAZINE (APRESOLINE) 100 MG tablet   rosuvastatin (CRESTOR) 20 MG tablet   Long term current use of aspirin       Relevant Medications   pantoprazole (PROTONIX) 40 MG tablet   Need for immunization against influenza       Relevant Orders   Flu Vaccine QUAD 1moIM (Fluarix, Fluzone & Alfiuria Quad PF) (Completed)   Need for pneumococcal vaccination       Relevant Orders   Pneumococcal conjugate vaccine 20-valent       Meds ordered this encounter  Medications   amLODipine (NORVASC) 10 MG tablet    Sig: Take 1 tablet (10 mg total) by mouth daily.    Dispense:  90 tablet    Refill:  2   carvedilol (COREG) 25 MG tablet    Sig: Take 1 tablet (25 mg total) by mouth 2 (two) times daily with a meal.    Dispense:  180 tablet    Refill:  1   clopidogrel  (PLAVIX) 75 MG tablet    Sig: Take 1 tablet (75 mg total) by mouth daily.    Dispense:  90 tablet    Refill:  3   hydrALAZINE (APRESOLINE) 100 MG tablet    Sig: Take 1 tablet (100 mg total) by mouth 3 (three) times daily.    Dispense:  90 tablet    Refill:  4   insulin aspart (NOVOLOG FLEXPEN) 100 UNIT/ML FlexPen    Sig: Inject 5-30 Units into the skin See admin instructions. Mac daily 30 units to include correction scale and snack coverage three times a day with meals    Dispense:  15 mL    Refill:  6   insulin glargine (LANTUS SOLOSTAR) 100 UNIT/ML Solostar Pen    Sig: Inject 16 Units into the skin daily.    Dispense:  15 mL    Refill:  6   Insulin Pen Needle (PEN NEEDLES 3/16") 31G X 5 MM MISC    Sig: To inject insulin with novolog flex pen, 3 times daily with meals.    Dispense:  100 each    Refill:  4    Please make sure these pen needles will work the [employer]   pantoprazole (PROTONIX) 40 MG tablet    Sig: Take 1 tablet (40 mg total) by mouth daily. For stomach protection    Dispense:  30 tablet    Refill:  11   rosuvastatin (CRESTOR) 20 MG tablet    Sig: TAKE 1 TABLET(20 MG) BY MOUTH DAILY    Dispense:  30 tablet    Refill:  4    Follow-up: Return in about 4 months (around 01/09/2022).    PAsencion Noble MD

## 2021-09-09 ENCOUNTER — Other Ambulatory Visit: Payer: Self-pay

## 2021-09-09 ENCOUNTER — Encounter: Payer: Self-pay | Admitting: Critical Care Medicine

## 2021-09-09 ENCOUNTER — Ambulatory Visit: Payer: Medicaid Other | Attending: Critical Care Medicine | Admitting: Critical Care Medicine

## 2021-09-09 VITALS — BP 153/65 | HR 94 | Resp 16 | Wt 119.0 lb

## 2021-09-09 DIAGNOSIS — E1022 Type 1 diabetes mellitus with diabetic chronic kidney disease: Secondary | ICD-10-CM | POA: Insufficient documentation

## 2021-09-09 DIAGNOSIS — Z7902 Long term (current) use of antithrombotics/antiplatelets: Secondary | ICD-10-CM | POA: Diagnosis not present

## 2021-09-09 DIAGNOSIS — N185 Chronic kidney disease, stage 5: Secondary | ICD-10-CM | POA: Diagnosis not present

## 2021-09-09 DIAGNOSIS — E785 Hyperlipidemia, unspecified: Secondary | ICD-10-CM | POA: Diagnosis not present

## 2021-09-09 DIAGNOSIS — Z79899 Other long term (current) drug therapy: Secondary | ICD-10-CM | POA: Insufficient documentation

## 2021-09-09 DIAGNOSIS — Z8673 Personal history of transient ischemic attack (TIA), and cerebral infarction without residual deficits: Secondary | ICD-10-CM | POA: Diagnosis not present

## 2021-09-09 DIAGNOSIS — Z8249 Family history of ischemic heart disease and other diseases of the circulatory system: Secondary | ICD-10-CM | POA: Diagnosis not present

## 2021-09-09 DIAGNOSIS — I69320 Aphasia following cerebral infarction: Secondary | ICD-10-CM

## 2021-09-09 DIAGNOSIS — Z794 Long term (current) use of insulin: Secondary | ICD-10-CM | POA: Diagnosis not present

## 2021-09-09 DIAGNOSIS — E1069 Type 1 diabetes mellitus with other specified complication: Secondary | ICD-10-CM | POA: Diagnosis not present

## 2021-09-09 DIAGNOSIS — E1065 Type 1 diabetes mellitus with hyperglycemia: Secondary | ICD-10-CM | POA: Insufficient documentation

## 2021-09-09 DIAGNOSIS — Z833 Family history of diabetes mellitus: Secondary | ICD-10-CM | POA: Insufficient documentation

## 2021-09-09 DIAGNOSIS — I120 Hypertensive chronic kidney disease with stage 5 chronic kidney disease or end stage renal disease: Secondary | ICD-10-CM | POA: Diagnosis not present

## 2021-09-09 DIAGNOSIS — Z8349 Family history of other endocrine, nutritional and metabolic diseases: Secondary | ICD-10-CM | POA: Diagnosis not present

## 2021-09-09 DIAGNOSIS — Z23 Encounter for immunization: Secondary | ICD-10-CM | POA: Diagnosis not present

## 2021-09-09 DIAGNOSIS — I10 Essential (primary) hypertension: Secondary | ICD-10-CM | POA: Diagnosis not present

## 2021-09-09 DIAGNOSIS — Z7982 Long term (current) use of aspirin: Secondary | ICD-10-CM | POA: Diagnosis not present

## 2021-09-09 DIAGNOSIS — E10319 Type 1 diabetes mellitus with unspecified diabetic retinopathy without macular edema: Secondary | ICD-10-CM | POA: Diagnosis not present

## 2021-09-09 DIAGNOSIS — E103599 Type 1 diabetes mellitus with proliferative diabetic retinopathy without macular edema, unspecified eye: Secondary | ICD-10-CM | POA: Diagnosis not present

## 2021-09-09 LAB — GLUCOSE, POCT (MANUAL RESULT ENTRY): POC Glucose: 148 mg/dl — AB (ref 70–99)

## 2021-09-09 MED ORDER — HYDRALAZINE HCL 100 MG PO TABS: 100.0000 mg | ORAL_TABLET | Freq: Three times a day (TID) | ORAL | 4 refills | Status: DC

## 2021-09-09 MED ORDER — AMLODIPINE BESYLATE 10 MG PO TABS: 10.0000 mg | ORAL_TABLET | Freq: Every day | ORAL | 2 refills | Status: DC

## 2021-09-09 MED ORDER — LANTUS SOLOSTAR 100 UNIT/ML ~~LOC~~ SOPN: 16.0000 [IU] | PEN_INJECTOR | Freq: Every day | SUBCUTANEOUS | 6 refills | Status: DC

## 2021-09-09 MED ORDER — NOVOLOG FLEXPEN 100 UNIT/ML ~~LOC~~ SOPN: 5.0000 [IU] | PEN_INJECTOR | SUBCUTANEOUS | 6 refills | Status: DC

## 2021-09-09 MED ORDER — CARVEDILOL 25 MG PO TABS: 25.0000 mg | ORAL_TABLET | Freq: Two times a day (BID) | ORAL | 1 refills | Status: DC

## 2021-09-09 MED ORDER — PANTOPRAZOLE SODIUM 40 MG PO TBEC: 40.0000 mg | DELAYED_RELEASE_TABLET | Freq: Every day | ORAL | 11 refills | Status: DC

## 2021-09-09 MED ORDER — "PEN NEEDLES 3/16"" 31G X 5 MM MISC": 4 refills | Status: DC

## 2021-09-09 MED ORDER — ROSUVASTATIN CALCIUM 20 MG PO TABS: ORAL_TABLET | ORAL | 4 refills | Status: DC

## 2021-09-09 MED ORDER — CLOPIDOGREL BISULFATE 75 MG PO TABS: 75.0000 mg | ORAL_TABLET | Freq: Every day | ORAL | 3 refills | Status: DC

## 2021-09-09 NOTE — Patient Instructions (Signed)
Pneumonia vaccine and flu vaccine was given  No change in medications  Keep your upcoming appointment with endocrinology, nephrology and the transplant clinics  Refills on all your medications sent to your [facility] in [place]  Return to see Dr. Joya Gaskins in 4 months

## 2021-09-09 NOTE — Assessment & Plan Note (Signed)
Continue current statins

## 2021-09-09 NOTE — Assessment & Plan Note (Signed)
This has resolved.

## 2021-09-09 NOTE — Assessment & Plan Note (Signed)
Stage V chronic kidney disease controlled at this time hypertensive renal disease  Follow-up per nephrology

## 2021-09-09 NOTE — Assessment & Plan Note (Signed)
Follow-up per ophthalmology

## 2021-09-09 NOTE — Assessment & Plan Note (Signed)
Systolic pressure slightly high diastolic adequate at 65 plan is to continue current medications no changes per nephrology

## 2021-09-09 NOTE — Assessment & Plan Note (Signed)
No change in insulin dosing keep appointment with endocrinology

## 2021-09-12 ENCOUNTER — Other Ambulatory Visit (HOSPITAL_COMMUNITY): Payer: Self-pay

## 2021-09-12 NOTE — Telephone Encounter (Signed)
Patient Advocate Encounter   Received notification from Cover My Meds that prior authorization for Dexcom is required.   PA submitted on 08/12/2021 Key [id number] (sensor) Status is approved.  Called ins. Because we never got a reply from CoverMyMeds. They said it didn't say approved, but a test claim shows they pay.  Left HIPAA compliant VM for pt so he knows he can get them filled if needed.     [facility] will continue to follow:     Armanda Magic, CPhT Patient Advocate [facility] Phone: [phone] Fax:  [phone]

## 2021-09-18 DIAGNOSIS — N185 Chronic kidney disease, stage 5: Secondary | ICD-10-CM | POA: Diagnosis not present

## 2021-09-25 DIAGNOSIS — N185 Chronic kidney disease, stage 5: Secondary | ICD-10-CM | POA: Diagnosis not present

## 2021-09-25 DIAGNOSIS — I770 Arteriovenous fistula, acquired: Secondary | ICD-10-CM | POA: Diagnosis not present

## 2021-09-25 DIAGNOSIS — I120 Hypertensive chronic kidney disease with stage 5 chronic kidney disease or end stage renal disease: Secondary | ICD-10-CM | POA: Diagnosis not present

## 2021-09-25 DIAGNOSIS — E872 Acidosis: Secondary | ICD-10-CM | POA: Diagnosis not present

## 2021-09-25 DIAGNOSIS — D631 Anemia in chronic kidney disease: Secondary | ICD-10-CM | POA: Diagnosis not present

## 2021-09-25 DIAGNOSIS — E1022 Type 1 diabetes mellitus with diabetic chronic kidney disease: Secondary | ICD-10-CM | POA: Diagnosis not present

## 2021-09-25 DIAGNOSIS — R809 Proteinuria, unspecified: Secondary | ICD-10-CM | POA: Diagnosis not present

## 2021-09-25 DIAGNOSIS — N2581 Secondary hyperparathyroidism of renal origin: Secondary | ICD-10-CM | POA: Diagnosis not present

## 2021-09-25 DIAGNOSIS — I639 Cerebral infarction, unspecified: Secondary | ICD-10-CM | POA: Diagnosis not present

## 2021-09-29 ENCOUNTER — Other Ambulatory Visit: Payer: Self-pay

## 2021-09-29 ENCOUNTER — Encounter (HOSPITAL_COMMUNITY): Payer: Self-pay | Admitting: Emergency Medicine

## 2021-09-29 ENCOUNTER — Inpatient Hospital Stay (HOSPITAL_COMMUNITY)
Admission: EM | Admit: 2021-09-29 | Discharge: 2021-10-01 | DRG: 640 | Disposition: A | Discharge status: Home / Self Care | Payer: Medicaid Other | Attending: Internal Medicine | Admitting: Internal Medicine

## 2021-09-29 DIAGNOSIS — I69320 Aphasia following cerebral infarction: Secondary | ICD-10-CM

## 2021-09-29 DIAGNOSIS — Z20822 Contact with and (suspected) exposure to covid-19: Secondary | ICD-10-CM | POA: Diagnosis present

## 2021-09-29 DIAGNOSIS — E109 Type 1 diabetes mellitus without complications: Secondary | ICD-10-CM | POA: Diagnosis present

## 2021-09-29 DIAGNOSIS — E860 Dehydration: Secondary | ICD-10-CM | POA: Diagnosis present

## 2021-09-29 DIAGNOSIS — E785 Hyperlipidemia, unspecified: Secondary | ICD-10-CM | POA: Diagnosis present

## 2021-09-29 DIAGNOSIS — E871 Hypo-osmolality and hyponatremia: Principal | ICD-10-CM | POA: Diagnosis present

## 2021-09-29 DIAGNOSIS — Z794 Long term (current) use of insulin: Secondary | ICD-10-CM

## 2021-09-29 DIAGNOSIS — E861 Hypovolemia: Secondary | ICD-10-CM | POA: Diagnosis present

## 2021-09-29 DIAGNOSIS — Z79899 Other long term (current) drug therapy: Secondary | ICD-10-CM

## 2021-09-29 DIAGNOSIS — R112 Nausea with vomiting, unspecified: Secondary | ICD-10-CM | POA: Diagnosis present

## 2021-09-29 DIAGNOSIS — D649 Anemia, unspecified: Secondary | ICD-10-CM | POA: Diagnosis present

## 2021-09-29 DIAGNOSIS — N186 End stage renal disease: Secondary | ICD-10-CM | POA: Diagnosis present

## 2021-09-29 DIAGNOSIS — E1022 Type 1 diabetes mellitus with diabetic chronic kidney disease: Secondary | ICD-10-CM | POA: Diagnosis present

## 2021-09-29 DIAGNOSIS — Z87891 Personal history of nicotine dependence: Secondary | ICD-10-CM

## 2021-09-29 DIAGNOSIS — R42 Dizziness and giddiness: Secondary | ICD-10-CM | POA: Diagnosis not present

## 2021-09-29 DIAGNOSIS — Z7902 Long term (current) use of antithrombotics/antiplatelets: Secondary | ICD-10-CM

## 2021-09-29 DIAGNOSIS — D72829 Elevated white blood cell count, unspecified: Secondary | ICD-10-CM | POA: Diagnosis present

## 2021-09-29 DIAGNOSIS — Z8249 Family history of ischemic heart disease and other diseases of the circulatory system: Secondary | ICD-10-CM

## 2021-09-29 DIAGNOSIS — E10319 Type 1 diabetes mellitus with unspecified diabetic retinopathy without macular edema: Secondary | ICD-10-CM | POA: Diagnosis present

## 2021-09-29 DIAGNOSIS — K219 Gastro-esophageal reflux disease without esophagitis: Secondary | ICD-10-CM | POA: Diagnosis present

## 2021-09-29 DIAGNOSIS — Z833 Family history of diabetes mellitus: Secondary | ICD-10-CM

## 2021-09-29 DIAGNOSIS — Z83438 Family history of other disorder of lipoprotein metabolism and other lipidemia: Secondary | ICD-10-CM

## 2021-09-29 DIAGNOSIS — I120 Hypertensive chronic kidney disease with stage 5 chronic kidney disease or end stage renal disease: Secondary | ICD-10-CM | POA: Diagnosis present

## 2021-09-29 DIAGNOSIS — I10 Essential (primary) hypertension: Secondary | ICD-10-CM | POA: Diagnosis present

## 2021-09-29 DIAGNOSIS — E876 Hypokalemia: Secondary | ICD-10-CM | POA: Diagnosis present

## 2021-09-29 LAB — I-STAT CHEM 8, ED
BUN: 34 mg/dL — ABNORMAL HIGH (ref 6–20)
Calcium, Ion: 1.01 mmol/L — ABNORMAL LOW (ref 1.15–1.40)
Chloride: 77 mmol/L — ABNORMAL LOW (ref 98–111)
Creatinine, Ser: 6 mg/dL — ABNORMAL HIGH (ref 0.61–1.24)
Glucose, Bld: 173 mg/dL — ABNORMAL HIGH (ref 70–99)
HCT: 37 % — ABNORMAL LOW (ref 39.0–52.0)
Hemoglobin: 12.6 g/dL — ABNORMAL LOW (ref 13.0–17.0)
Potassium: 4 mmol/L (ref 3.5–5.1)
Sodium: 112 mmol/L — CL (ref 135–145)
TCO2: 23 mmol/L (ref 22–32)

## 2021-09-29 LAB — CBG MONITORING, ED: Glucose-Capillary: 174 mg/dL — ABNORMAL HIGH (ref 70–99)

## 2021-09-29 MED ORDER — ONDANSETRON 4 MG PO TBDP
4.0000 mg | ORAL_TABLET | Freq: Once | ORAL | Status: AC
  Administered 2021-09-29: 4 mg via ORAL
  Filled 2021-09-29: qty 1

## 2021-09-29 NOTE — ED Triage Notes (Signed)
The pt arrived by gems from home  c/o dizziness with nausea and vomiting for 3 days  unable to hold down solid food

## 2021-09-30 ENCOUNTER — Inpatient Hospital Stay (HOSPITAL_COMMUNITY): Payer: Medicaid Other

## 2021-09-30 ENCOUNTER — Emergency Department (HOSPITAL_COMMUNITY): Payer: Medicaid Other

## 2021-09-30 ENCOUNTER — Other Ambulatory Visit (HOSPITAL_COMMUNITY): Payer: Medicaid Other

## 2021-09-30 ENCOUNTER — Encounter (HOSPITAL_COMMUNITY): Payer: Self-pay | Admitting: Emergency Medicine

## 2021-09-30 DIAGNOSIS — Z20822 Contact with and (suspected) exposure to covid-19: Secondary | ICD-10-CM | POA: Diagnosis not present

## 2021-09-30 DIAGNOSIS — Z833 Family history of diabetes mellitus: Secondary | ICD-10-CM | POA: Diagnosis not present

## 2021-09-30 DIAGNOSIS — E861 Hypovolemia: Secondary | ICD-10-CM | POA: Diagnosis not present

## 2021-09-30 DIAGNOSIS — R42 Dizziness and giddiness: Secondary | ICD-10-CM | POA: Diagnosis not present

## 2021-09-30 DIAGNOSIS — E10319 Type 1 diabetes mellitus with unspecified diabetic retinopathy without macular edema: Secondary | ICD-10-CM | POA: Diagnosis not present

## 2021-09-30 DIAGNOSIS — K219 Gastro-esophageal reflux disease without esophagitis: Secondary | ICD-10-CM | POA: Diagnosis present

## 2021-09-30 DIAGNOSIS — D649 Anemia, unspecified: Secondary | ICD-10-CM | POA: Diagnosis not present

## 2021-09-30 DIAGNOSIS — Z79899 Other long term (current) drug therapy: Secondary | ICD-10-CM | POA: Diagnosis not present

## 2021-09-30 DIAGNOSIS — R112 Nausea with vomiting, unspecified: Secondary | ICD-10-CM | POA: Diagnosis present

## 2021-09-30 DIAGNOSIS — E860 Dehydration: Secondary | ICD-10-CM | POA: Diagnosis not present

## 2021-09-30 DIAGNOSIS — G9389 Other specified disorders of brain: Secondary | ICD-10-CM | POA: Diagnosis not present

## 2021-09-30 DIAGNOSIS — I69320 Aphasia following cerebral infarction: Secondary | ICD-10-CM | POA: Diagnosis not present

## 2021-09-30 DIAGNOSIS — N186 End stage renal disease: Secondary | ICD-10-CM | POA: Diagnosis present

## 2021-09-30 DIAGNOSIS — E876 Hypokalemia: Secondary | ICD-10-CM | POA: Diagnosis not present

## 2021-09-30 DIAGNOSIS — D72829 Elevated white blood cell count, unspecified: Secondary | ICD-10-CM

## 2021-09-30 DIAGNOSIS — Z87891 Personal history of nicotine dependence: Secondary | ICD-10-CM | POA: Diagnosis not present

## 2021-09-30 DIAGNOSIS — E1022 Type 1 diabetes mellitus with diabetic chronic kidney disease: Secondary | ICD-10-CM | POA: Diagnosis not present

## 2021-09-30 DIAGNOSIS — E109 Type 1 diabetes mellitus without complications: Secondary | ICD-10-CM | POA: Diagnosis present

## 2021-09-30 DIAGNOSIS — E871 Hypo-osmolality and hyponatremia: Secondary | ICD-10-CM | POA: Diagnosis not present

## 2021-09-30 DIAGNOSIS — Z8249 Family history of ischemic heart disease and other diseases of the circulatory system: Secondary | ICD-10-CM | POA: Diagnosis not present

## 2021-09-30 DIAGNOSIS — I10 Essential (primary) hypertension: Secondary | ICD-10-CM | POA: Diagnosis not present

## 2021-09-30 DIAGNOSIS — G319 Degenerative disease of nervous system, unspecified: Secondary | ICD-10-CM | POA: Diagnosis not present

## 2021-09-30 DIAGNOSIS — Z7902 Long term (current) use of antithrombotics/antiplatelets: Secondary | ICD-10-CM | POA: Diagnosis not present

## 2021-09-30 DIAGNOSIS — Z83438 Family history of other disorder of lipoprotein metabolism and other lipidemia: Secondary | ICD-10-CM | POA: Diagnosis not present

## 2021-09-30 DIAGNOSIS — E785 Hyperlipidemia, unspecified: Secondary | ICD-10-CM | POA: Diagnosis not present

## 2021-09-30 DIAGNOSIS — I120 Hypertensive chronic kidney disease with stage 5 chronic kidney disease or end stage renal disease: Secondary | ICD-10-CM | POA: Diagnosis not present

## 2021-09-30 DIAGNOSIS — Z794 Long term (current) use of insulin: Secondary | ICD-10-CM | POA: Diagnosis not present

## 2021-09-30 DIAGNOSIS — I6381 Other cerebral infarction due to occlusion or stenosis of small artery: Secondary | ICD-10-CM | POA: Diagnosis not present

## 2021-09-30 HISTORY — DX: Hypo-osmolality and hyponatremia: E87.1

## 2021-09-30 HISTORY — DX: Nausea with vomiting, unspecified: R11.2

## 2021-09-30 LAB — CBC
HCT: 33 % — ABNORMAL LOW (ref 39.0–52.0)
HCT: 28.3 % — ABNORMAL LOW (ref 39.0–52.0)
Hemoglobin: 12.2 g/dL — ABNORMAL LOW (ref 13.0–17.0)
Hemoglobin: 10.3 g/dL — ABNORMAL LOW (ref 13.0–17.0)
MCH: 30.3 pg (ref 26.0–34.0)
MCH: 30.2 pg (ref 26.0–34.0)
MCHC: 37 g/dL — ABNORMAL HIGH (ref 30.0–36.0)
MCHC: 36.4 g/dL — ABNORMAL HIGH (ref 30.0–36.0)
MCV: 82.1 fL (ref 80.0–100.0)
MCV: 83 fL (ref 80.0–100.0)
Platelets: 226 10*3/uL (ref 150–400)
Platelets: 174 10*3/uL (ref 150–400)
RBC: 4.02 MIL/uL — ABNORMAL LOW (ref 4.22–5.81)
RBC: 3.41 MIL/uL — ABNORMAL LOW (ref 4.22–5.81)
RDW: 12.5 % (ref 11.5–15.5)
RDW: 12.5 % (ref 11.5–15.5)
WBC: 14.6 10*3/uL — ABNORMAL HIGH (ref 4.0–10.5)
WBC: 11.1 10*3/uL — ABNORMAL HIGH (ref 4.0–10.5)
nRBC: 0 % (ref 0.0–0.2)
nRBC: 0 % (ref 0.0–0.2)

## 2021-09-30 LAB — URINALYSIS, COMPLETE (UACMP) WITH MICROSCOPIC
Bilirubin Urine: NEGATIVE
Glucose, UA: 50 mg/dL — AB
Ketones, ur: NEGATIVE mg/dL
Leukocytes,Ua: NEGATIVE
Nitrite: NEGATIVE
Protein, ur: 100 mg/dL — AB
Specific Gravity, Urine: 1.004 — ABNORMAL LOW (ref 1.005–1.030)
pH: 6 (ref 5.0–8.0)

## 2021-09-30 LAB — COMPREHENSIVE METABOLIC PANEL
ALT: 36 U/L (ref 0–44)
ALT: 43 U/L (ref 0–44)
AST: 28 U/L (ref 15–41)
AST: 36 U/L (ref 15–41)
Albumin: 3.3 g/dL — ABNORMAL LOW (ref 3.5–5.0)
Albumin: 3.9 g/dL (ref 3.5–5.0)
Alkaline Phosphatase: 78 U/L (ref 38–126)
Alkaline Phosphatase: 90 U/L (ref 38–126)
Anion gap: 14 (ref 5–15)
Anion gap: 16 — ABNORMAL HIGH (ref 5–15)
BUN: 34 mg/dL — ABNORMAL HIGH (ref 6–20)
BUN: 35 mg/dL — ABNORMAL HIGH (ref 6–20)
CO2: 20 mmol/L — ABNORMAL LOW (ref 22–32)
CO2: 21 mmol/L — ABNORMAL LOW (ref 22–32)
Calcium: 8 mg/dL — ABNORMAL LOW (ref 8.9–10.3)
Calcium: 9 mg/dL (ref 8.9–10.3)
Chloride: 76 mmol/L — ABNORMAL LOW (ref 98–111)
Chloride: 80 mmol/L — ABNORMAL LOW (ref 98–111)
Creatinine, Ser: 5.18 mg/dL — ABNORMAL HIGH (ref 0.61–1.24)
Creatinine, Ser: 5.35 mg/dL — ABNORMAL HIGH (ref 0.61–1.24)
GFR, Estimated: 13 mL/min — ABNORMAL LOW (ref >60)
GFR, Estimated: 14 mL/min — ABNORMAL LOW (ref >60)
Glucose, Bld: 175 mg/dL — ABNORMAL HIGH (ref 70–99)
Glucose, Bld: 232 mg/dL — ABNORMAL HIGH (ref 70–99)
Potassium: 3.7 mmol/L (ref 3.5–5.1)
Potassium: 3.9 mmol/L (ref 3.5–5.1)
Sodium: 113 mmol/L — CL (ref 135–145)
Sodium: 114 mmol/L — CL (ref 135–145)
Total Bilirubin: 0.8 mg/dL (ref 0.3–1.2)
Total Bilirubin: 0.9 mg/dL (ref 0.3–1.2)
Total Protein: 5.6 g/dL — ABNORMAL LOW (ref 6.5–8.1)
Total Protein: 6.4 g/dL — ABNORMAL LOW (ref 6.5–8.1)

## 2021-09-30 LAB — BASIC METABOLIC PANEL
Anion gap: 11 (ref 5–15)
Anion gap: 10 (ref 5–15)
BUN: 34 mg/dL — ABNORMAL HIGH (ref 6–20)
BUN: 36 mg/dL — ABNORMAL HIGH (ref 6–20)
CO2: 21 mmol/L — ABNORMAL LOW (ref 22–32)
CO2: 22 mmol/L (ref 22–32)
Calcium: 8.2 mg/dL — ABNORMAL LOW (ref 8.9–10.3)
Calcium: 8.1 mg/dL — ABNORMAL LOW (ref 8.9–10.3)
Chloride: 87 mmol/L — ABNORMAL LOW (ref 98–111)
Chloride: 89 mmol/L — ABNORMAL LOW (ref 98–111)
Creatinine, Ser: 5.3 mg/dL — ABNORMAL HIGH (ref 0.61–1.24)
Creatinine, Ser: 5.5 mg/dL — ABNORMAL HIGH (ref 0.61–1.24)
GFR, Estimated: 14 mL/min — ABNORMAL LOW (ref >60)
GFR, Estimated: 13 mL/min — ABNORMAL LOW (ref >60)
Glucose, Bld: 146 mg/dL — ABNORMAL HIGH (ref 70–99)
Glucose, Bld: 162 mg/dL — ABNORMAL HIGH (ref 70–99)
Potassium: 3.3 mmol/L — ABNORMAL LOW (ref 3.5–5.1)
Potassium: 3.3 mmol/L — ABNORMAL LOW (ref 3.5–5.1)
Sodium: 119 mmol/L — CL (ref 135–145)
Sodium: 121 mmol/L — ABNORMAL LOW (ref 135–145)

## 2021-09-30 LAB — RAPID URINE DRUG SCREEN, HOSP PERFORMED
Amphetamines: NOT DETECTED
Barbiturates: NOT DETECTED
Benzodiazepines: NOT DETECTED
Cocaine: NOT DETECTED
Opiates: NOT DETECTED
Tetrahydrocannabinol: POSITIVE — AB

## 2021-09-30 LAB — DIFFERENTIAL
Abs Immature Granulocytes: 0.08 10*3/uL — ABNORMAL HIGH (ref 0.00–0.07)
Basophils Absolute: 0 10*3/uL (ref 0.0–0.1)
Basophils Relative: 0 %
Eosinophils Absolute: 0.1 10*3/uL (ref 0.0–0.5)
Eosinophils Relative: 1 %
Immature Granulocytes: 1 %
Lymphocytes Relative: 3 %
Lymphs Abs: 0.5 10*3/uL — ABNORMAL LOW (ref 0.7–4.0)
Monocytes Absolute: 0.8 10*3/uL (ref 0.1–1.0)
Monocytes Relative: 5 %
Neutro Abs: 13.2 10*3/uL — ABNORMAL HIGH (ref 1.7–7.7)
Neutrophils Relative %: 90 %

## 2021-09-30 LAB — CBG MONITORING, ED
Glucose-Capillary: 240 mg/dL — ABNORMAL HIGH (ref 70–99)
Glucose-Capillary: 175 mg/dL — ABNORMAL HIGH (ref 70–99)
Glucose-Capillary: 155 mg/dL — ABNORMAL HIGH (ref 70–99)
Glucose-Capillary: 159 mg/dL — ABNORMAL HIGH (ref 70–99)
Glucose-Capillary: 140 mg/dL — ABNORMAL HIGH (ref 70–99)

## 2021-09-30 LAB — URIC ACID: Uric Acid, Serum: 5.5 mg/dL (ref 3.7–8.6)

## 2021-09-30 LAB — RESP PANEL BY RT-PCR (FLU A&B, COVID) ARPGX2
Influenza A by PCR: NEGATIVE
Influenza B by PCR: NEGATIVE
SARS Coronavirus 2 by RT PCR: NEGATIVE

## 2021-09-30 LAB — OSMOLALITY, URINE: Osmolality, Ur: 109 mOsm/kg — ABNORMAL LOW (ref 300–900)

## 2021-09-30 LAB — PROTIME-INR
INR: 0.9 (ref 0.8–1.2)
Prothrombin Time: 12.1 seconds (ref 11.4–15.2)

## 2021-09-30 LAB — PHOSPHORUS: Phosphorus: 3.8 mg/dL (ref 2.5–4.6)

## 2021-09-30 LAB — SODIUM, URINE, RANDOM: Sodium, Ur: <10 mmol/L

## 2021-09-30 LAB — APTT: aPTT: 26 seconds (ref 24–36)

## 2021-09-30 LAB — CREATININE, URINE, RANDOM: Creatinine, Urine: 35.58 mg/dL

## 2021-09-30 LAB — MAGNESIUM: Magnesium: 1.9 mg/dL (ref 1.7–2.4)

## 2021-09-30 LAB — TSH: TSH: 1.826 u[IU]/mL (ref 0.350–4.500)

## 2021-09-30 LAB — OSMOLALITY: Osmolality: 256 mOsm/kg — ABNORMAL LOW (ref 275–295)

## 2021-09-30 LAB — ETHANOL: Alcohol, Ethyl (B): <10 mg/dL (ref <10)

## 2021-09-30 LAB — HIV ANTIBODY (ROUTINE TESTING W REFLEX): HIV Screen 4th Generation wRfx: NONREACTIVE

## 2021-09-30 MED ORDER — METOPROLOL TARTRATE 5 MG/5ML IV SOLN: 5.0000 mg | INTRAVENOUS | Status: DC | PRN

## 2021-09-30 MED ORDER — HYDRALAZINE HCL 20 MG/ML IJ SOLN: 10.0000 mg | INTRAMUSCULAR | Status: DC | PRN

## 2021-09-30 MED ORDER — INSULIN ASPART 100 UNIT/ML IJ SOLN
0.0000 [IU] | Freq: Three times a day (TID) | INTRAMUSCULAR | Status: DC
  Administered 2021-09-30 (×3): 1 [IU] via SUBCUTANEOUS
  Administered 2021-10-01: 2 [IU] via SUBCUTANEOUS

## 2021-09-30 MED ORDER — SODIUM BICARBONATE 650 MG PO TABS
650.0000 mg | ORAL_TABLET | Freq: Two times a day (BID) | ORAL | Status: DC
  Administered 2021-09-30 – 2021-10-01 (×3): 650 mg via ORAL
  Filled 2021-09-30 (×3): qty 1

## 2021-09-30 MED ORDER — PANTOPRAZOLE SODIUM 40 MG PO TBEC
40.0000 mg | DELAYED_RELEASE_TABLET | Freq: Every day | ORAL | Status: DC
  Administered 2021-09-30 – 2021-10-01 (×2): 40 mg via ORAL
  Filled 2021-09-30 (×2): qty 1

## 2021-09-30 MED ORDER — CLOPIDOGREL BISULFATE 75 MG PO TABS
75.0000 mg | ORAL_TABLET | Freq: Every day | ORAL | Status: DC
  Administered 2021-09-30 – 2021-10-01 (×2): 75 mg via ORAL
  Filled 2021-09-30 (×2): qty 1

## 2021-09-30 MED ORDER — INSULIN GLARGINE 100 UNIT/ML SOLOSTAR PEN: 16.0000 [IU] | PEN_INJECTOR | Freq: Every day | SUBCUTANEOUS | Status: DC

## 2021-09-30 MED ORDER — ONDANSETRON HCL 4 MG/2ML IJ SOLN: 4.0000 mg | Freq: Four times a day (QID) | INTRAMUSCULAR | Status: DC | PRN

## 2021-09-30 MED ORDER — INSULIN GLARGINE-YFGN 100 UNIT/ML ~~LOC~~ SOLN
16.0000 [IU] | Freq: Every day | SUBCUTANEOUS | Status: DC
  Administered 2021-09-30 – 2021-10-01 (×2): 16 [IU] via SUBCUTANEOUS
  Filled 2021-09-30 (×2): qty 0.16

## 2021-09-30 MED ORDER — GUAIFENESIN 100 MG/5ML PO SOLN: 5.0000 mL | ORAL | Status: DC | PRN

## 2021-09-30 MED ORDER — AMLODIPINE BESYLATE 5 MG PO TABS
10.0000 mg | ORAL_TABLET | Freq: Every day | ORAL | Status: DC
  Administered 2021-09-30 – 2021-10-01 (×2): 10 mg via ORAL
  Filled 2021-09-30 (×2): qty 2

## 2021-09-30 MED ORDER — IPRATROPIUM-ALBUTEROL 0.5-2.5 (3) MG/3ML IN SOLN: 3.0000 mL | RESPIRATORY_TRACT | Status: DC | PRN

## 2021-09-30 MED ORDER — ACETAMINOPHEN 650 MG RE SUPP: 650.0000 mg | Freq: Four times a day (QID) | RECTAL | Status: DC | PRN

## 2021-09-30 MED ORDER — CARVEDILOL 12.5 MG PO TABS
25.0000 mg | ORAL_TABLET | Freq: Two times a day (BID) | ORAL | Status: DC
  Administered 2021-09-30 – 2021-10-01 (×4): 25 mg via ORAL
  Filled 2021-09-30 (×4): qty 2

## 2021-09-30 MED ORDER — SENNOSIDES-DOCUSATE SODIUM 8.6-50 MG PO TABS
1.0000 | ORAL_TABLET | Freq: Every evening | ORAL | Status: DC | PRN
Start: 2021-09-30 — End: 2021-10-01

## 2021-09-30 MED ORDER — SODIUM CHLORIDE 0.9 % IV SOLN: INTRAVENOUS | Status: DC

## 2021-09-30 MED ORDER — HYDRALAZINE HCL 50 MG PO TABS
100.0000 mg | ORAL_TABLET | Freq: Three times a day (TID) | ORAL | Status: DC
  Administered 2021-09-30 – 2021-10-01 (×5): 100 mg via ORAL
  Filled 2021-09-30 (×5): qty 2

## 2021-09-30 MED ORDER — ROSUVASTATIN CALCIUM 20 MG PO TABS
20.0000 mg | ORAL_TABLET | Freq: Every day | ORAL | Status: DC
  Administered 2021-09-30 – 2021-10-01 (×2): 20 mg via ORAL
  Filled 2021-09-30 (×2): qty 1

## 2021-09-30 MED ORDER — ACETAMINOPHEN 325 MG PO TABS: 650.0000 mg | ORAL_TABLET | Freq: Four times a day (QID) | ORAL | Status: DC | PRN

## 2021-09-30 NOTE — Progress Notes (Signed)
PROGRESS NOTE    [facility]  [id number] DOB: Jul 14, 1986 DOA: 09/29/2021 PCP: Elsie Stain, MD   Brief Narrative:  35 year old with history of chronic infarcts/CVA, DM 1, CKD stage V, GERD admitted to the hospital for nausea vomiting and severe hyponatremia.  He was found to have serum sodium of 113, nephrology was consulted and was started on IV fluids.   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  He is also been on very low salt diet at home therefore could be total salt depletion.  Continue normal saline, BMP every 6 hours - Nephrology following  Nausea vomiting - resolved  Leukocytosis - Likely reactive.  CKD stage V; Cr 5.18 - Nephrology consulted, not yet on dialysis.  On bicarb  Diabetes mellitus type 1 - Continue insulin and sliding scale.  Basal insulin 16 units daily  Essential hypertension - On Coreg and amlodipine  Previous history of CVA - On Plavix and statin  GERD - Daily Protonix  Hyperlipidemia - Crestor    DVT prophylaxis: SCDs Start: 09/30/21 0134 Code Status: Full code Family Communication:    Status is: Inpatient  Remains inpatient appropriate because:Inpatient level of care appropriate due to severity of illness  Dispo: The patient is from: Home              Anticipated d/c is to: Home              Patient currently is not medically stable to d/c.   Difficult to place patient No          Subjective: Seen and examined at bedside, feels okay no complaints.  Tells me his nausea vomiting has subsided  Review of Systems Otherwise negative except as per  HPI, including: General: Denies fever, chills, night sweats or unintended weight loss. Resp: Denies cough, wheezing, shortness of breath. Cardiac: Denies chest pain, palpitations, orthopnea, paroxysmal nocturnal dyspnea. GI: Denies abdominal pain, nausea, vomiting, diarrhea or constipation GU: Denies dysuria, frequency, hesitancy or incontinence MS: Denies muscle aches, joint pain or swelling Neuro: Denies headache, neurologic deficits (focal weakness, numbness, tingling), abnormal gait Psych: Denies anxiety, depression, SI/HI/AVH Skin: Denies new rashes or lesions ID: Denies sick contacts, exotic exposures, travel  Examination:  General exam: Appears calm and comfortable  Respiratory system: Clear to auscultation. Respiratory effort normal. Cardiovascular system: S1 & S2 heard, RRR. No JVD, murmurs, rubs, gallops or clicks. No pedal edema. Gastrointestinal system: Abdomen is nondistended, soft and nontender. No organomegaly or masses felt. Normal bowel sounds heard. Central nervous system: Alert and oriented. No focal neurological deficits. Extremities: Symmetric 5 x 5 power. Skin: No rashes, lesions or ulcers Psychiatry: Judgement and insight appear normal. Mood & affect appropriate.     Objective: Vitals:   09/30/21 0930 09/30/21 1000 09/30/21 1030 09/30/21 1100  BP: (!) 149/81 (!) 149/84 (!) 148/80 (!) 153/80  Pulse: 90 91 94 92  Resp: 17 (!) '22 16 15  '$ Temp:      TempSrc:      SpO2: 98% 97% 96% 96%  Weight:      Height:       No intake  or output data in the 24 hours ending 09/30/21 1134 Filed Weights   09/29/21 2324  Weight: 56.7 kg     Data Reviewed:   CBC: Recent Labs  Lab 09/29/21 2331 09/29/21 2351 09/30/21 0356  WBC 14.6*  --  11.1*  NEUTROABS 13.2*  --   --   HGB 12.2* 12.6* 10.3*  HCT 33.0* 37.0* 28.3*  MCV 82.1  --  83.0  PLT 226  --  [id number]   Basic Metabolic Panel: Recent Labs  Lab 09/29/21 2331 09/29/21 2351 09/30/21 0356  NA 113* 112* 114*   K 3.9 4.0 3.7  CL 76* 77* 80*  CO2 21*  --  20*  GLUCOSE 175* 173* 232*  BUN 34* 34* 35*  CREATININE 5.35* 6.00* 5.18*  CALCIUM 9.0  --  8.0*  MG  --   --  1.9  PHOS  --   --  3.8   GFR: Estimated Creatinine Clearance: 16 mL/min (A) (by C-G formula based on SCr of 5.18 mg/dL (H)). Liver Function Tests: Recent Labs  Lab 09/29/21 2331 09/30/21 0356  AST 36 28  ALT 43 36  ALKPHOS 90 78  BILITOT 0.9 0.8  PROT 6.4* 5.6*  ALBUMIN 3.9 3.3*   No results for input(s): LIPASE, AMYLASE in the last 168 hours. No results for input(s): AMMONIA in the last 168 hours. Coagulation Profile: Recent Labs  Lab 09/29/21 2331  INR 0.9   Cardiac Enzymes: No results for input(s): CKTOTAL, CKMB, CKMBINDEX, TROPONINI in the last 168 hours. BNP (last 3 results) No results for input(s): PROBNP in the last 8760 hours. HbA1C: No results for input(s): HGBA1C in the last 72 hours. CBG: Recent Labs  Lab 09/29/21 2317 09/30/21 0327 09/30/21 0825  GLUCAP 174* 240* 175*   Lipid Profile: No results for input(s): CHOL, HDL, LDLCALC, TRIG, CHOLHDL, LDLDIRECT in the last 72 hours. Thyroid Function Tests: Recent Labs    09/30/21 0356  TSH 1.826   Anemia Panel: No results for input(s): VITAMINB12, FOLATE, FERRITIN, TIBC, IRON, RETICCTPCT in the last 72 hours. Sepsis Labs: No results for input(s): PROCALCITON, LATICACIDVEN in the last 168 hours.  Recent Results (from the past 240 hour(s))  Resp Panel by RT-PCR (Flu A&B, Covid) Nasopharyngeal Swab     Status: None   Collection Time: 09/30/21 12:44 AM   Specimen: Nasopharyngeal Swab; Nasopharyngeal(NP) swabs in vial transport medium  Result Value Ref Range Status   SARS Coronavirus 2 by RT PCR NEGATIVE NEGATIVE Final    Comment: (NOTE) SARS-CoV-2 target nucleic acids are NOT DETECTED.  The SARS-CoV-2 RNA is generally detectable in upper respiratory specimens during the acute phase of infection. The lowest concentration of SARS-CoV-2 viral  copies this assay can detect is 138 copies/mL. A negative result does not preclude SARS-Cov-2 infection and should not be used as the sole basis for treatment or other patient management decisions. A negative result may occur with  improper specimen collection/handling, submission of specimen other than nasopharyngeal swab, presence of viral mutation(s) within the areas targeted by this assay, and inadequate number of viral copies(<138 copies/mL). A negative result must be combined with clinical observations, patient history, and epidemiological information. The expected result is Negative.  Fact Sheet for Patients:  EntrepreneurPulse.com.au  Fact Sheet for Healthcare Providers:  IncredibleEmployment.be  This test is no t yet approved or cleared by the [place] FDA and  has been authorized for detection and/or diagnosis of SARS-CoV-2 by FDA under an Emergency Use Authorization (EUA). This EUA will  remain  in effect (meaning this test can be used) for the duration of the COVID-19 declaration under Section 564(b)(1) of the Act, 21 U.S.C.section 360bbb-3(b)(1), unless the authorization is terminated  or revoked sooner.       Influenza A by PCR NEGATIVE NEGATIVE Final   Influenza B by PCR NEGATIVE NEGATIVE Final    Comment: (NOTE) The Xpert Xpress SARS-CoV-2/FLU/RSV plus assay is intended as an aid in the diagnosis of influenza from Nasopharyngeal swab specimens and should not be used as a sole basis for treatment. Nasal washings and aspirates are unacceptable for Xpert Xpress SARS-CoV-2/FLU/RSV testing.  Fact Sheet for Patients: EntrepreneurPulse.com.au  Fact Sheet for Healthcare Providers: IncredibleEmployment.be  This test is not yet approved or cleared by the [place] FDA and has been authorized for detection and/or diagnosis of SARS-CoV-2 by FDA under an Emergency Use Authorization (EUA). This  EUA will remain in effect (meaning this test can be used) for the duration of the COVID-19 declaration under Section 564(b)(1) of the Act, 21 U.S.C. section 360bbb-3(b)(1), unless the authorization is terminated or revoked.  Performed at [facility], [place] [address] [place], [place] [address]          Radiology Studies: CT HEAD WO CONTRAST  Result Date: 09/30/2021 CLINICAL DATA:  Dizziness EXAM: CT HEAD WITHOUT CONTRAST TECHNIQUE: Contiguous axial images were obtained from the base of the skull through the vertex without intravenous contrast. COMPARISON:  01/11/2020 FINDINGS: Brain: Right cerebellar and left occipital lobe infarcts are noted, stable from the prior exam. No acute hemorrhage, acute infarction or space-occupying mass lesion is seen. Vascular: No hyperdense vessel or unexpected calcification. Skull: Normal. Negative for fracture or focal lesion. Sinuses/Orbits: No acute finding. Other: None. IMPRESSION: No acute intracranial abnormality noted. Chronic infarcts as described. Electronically Signed   By: Inez Catalina M.D.   On: 09/30/2021 00:15   MR BRAIN WO CONTRAST  Result Date: 09/30/2021 CLINICAL DATA:  Nonspecific dizziness.  Nausea and vomiting. EXAM: MRI HEAD WITHOUT CONTRAST TECHNIQUE: Multiplanar, multiecho pulse sequences of the brain and surrounding structures were obtained without intravenous contrast. COMPARISON:  Head CT same day.  MRI 01/12/2020. FINDINGS: Brain: Diffusion imaging does not show any acute or subacute infarction. Mild chronic small-vessel ischemic changes affect the pons. Numerous old small vessel cerebellar infarctions as seen previously. Old left PCA territory infarction affecting the posteromedial temporal lobe and occipital lobe with atrophy, encephalomalacia and gliosis. Old lacunar infarction of both thalami. Old lacunar infarction of the right caudate. Mild small vessel ischemic changes elsewhere affecting the cerebral hemispheric white  matter. No mass lesion, recent hemorrhage, hydrocephalus or extra-axial collection. Some chronic hemosiderin deposition associated with the old left PCA territory infarction. Vascular: Major vessels at the base of the brain show flow. Skull and upper cervical spine: Negative Sinuses/Orbits: Clear/normal Other: None IMPRESSION: No acute finding. Numerous old small vessel cerebellar infarctions. Mild chronic small-vessel ischemic change of the pons. Old left PCA territory infarction affecting the posteromedial temporal lobe and occipital lobe. Old lacunar infarctions of the thalami and right caudate. Electronically Signed   By: Nelson Chimes M.D.   On: 09/30/2021 08:11   DG Chest Portable 1 View  Result Date: 09/30/2021 CLINICAL DATA:  Dizziness EXAM: PORTABLE CHEST 1 VIEW COMPARISON:  01/12/2020 FINDINGS: Check shadow is mildly prominent but accentuated by the portable technique. Lungs are clear bilaterally. No focal infiltrate or effusion is noted. No bony abnormality is seen. IMPRESSION: No acute abnormality noted. Electronically Signed   By:  Inez Catalina M.D.   On: 09/30/2021 00:30        Scheduled Meds:  amLODipine  10 mg Oral Daily   carvedilol  25 mg Oral BID WC   clopidogrel  75 mg Oral Daily   hydrALAZINE  100 mg Oral TID   insulin aspart  0-6 Units Subcutaneous TID WC   insulin glargine-yfgn  16 Units Subcutaneous Daily   pantoprazole  40 mg Oral Daily   rosuvastatin  20 mg Oral Daily   sodium bicarbonate  650 mg Oral BID   Continuous Infusions:  sodium chloride 100 mL/hr at 09/30/21 0413     LOS: 0 days   Time spent= 35 mins    Wylee Ogden Arsenio Loader, MD [facility]  If 7PM-7AM, please contact night-coverage  09/30/2021, 11:34 AM

## 2021-09-30 NOTE — ED Notes (Signed)
Pt is in MRI at this time.

## 2021-09-30 NOTE — H&P (Signed)
History and Physical    PLEASE NOTE THAT DRAGON DICT[employer] SOFTWARE WAS USED IN THE CONSTRUCTION OF THIS NOTE.   Shi Dunleavy GXQ:[id number] DOB: 1986/05/25 DOA: 09/29/2021  PCP: Elsie Stain, MD Patient coming from: home   I have personally briefly reviewed patient's old medical records in [place]  Chief Complaint: Nausea vomiting  HPI: George [name] is a 35 y.o. male with medical history significant for chronic ischemic infarcts involving the right cerebellar and left occipital lobes, type 1 diabetes, end-stage renal disease not yet on hemodialysis, hypertension, GERD, who is admitted to [facility] on 09/29/2021 with acute hyponatremia after presenting from home to [facility] ED complaining of nausea/vomiting.   The patient reports 3 days intermittent nausea resulting in at least 10 episodes of nonbloody, nonbilious emesis over that time.  Approximately day after the first started experiencing nausea/vomiting, the patient reports that he developed associated dizziness in the absence of any associated vertigo.  He denies any associated acute focal weakness, acute focal numbness, paresthesias, facial droop, slurred speech, expressive aphasia, acute change in vision, dysphagia.  He also denies any associated chest pain, palpitations, diaphoresis, presyncope, or syncope.  No recent trauma, fall, or head injury.  In the setting of this recent nausea/vomiting, the patient reports significant decline in his oral intake over the last 3 days.  He denies any associated abdominal discomfort, diarrhea, melena, or hematochezia.  No recent rash.    He knowledges a history of end-stage renal disease for which he has been following with nephrology as an outpatient, and conveys that he has not yet on hemodialysis.  Per the patient, he was reportedly told by his outpatient nephrologist, that he would likely begin to require hemodialysis in approximately 6 months from now.  Does not appear to  have a AV fistula for hemodialysis catheter at this time.  He reports chronic edema in the bilateral lower extremities, without any significant worsening relative to baseline.  Denies any orthopnea, PND.  Reports that he continues to produce urine, and denies any recent dysuria, gross hematuria, or change in urinary urgency/frequency.  No neck stiffness.  No recent cough, hemoptysis.  No known recent COVID-19 exposures.  He confirms that he is not on any diuretic medications at home, including no HCTZ.  Reports that he consumes less than 1 beer per day.  Of note, per chart review, most recent prior serum sodium noted to be 138 in March 2022, will most recent prior serum creatinine was noted to be 5.9 in March 2022.    ED Course:  Vital signs in the ED were notable for the following: Afebrile; heart rate 84-92; pressure 163/90; respiratory rate 16-20, oxygen saturation 97 to 99% on room air.  Labs were notable for the following: CMP notable for the following: Sodium 113, corrected to approximately 114.5 when taking into account mild concomitant hyperglycemia, potassium 3.9, chloride 76, bicarbonate 21, anion gap 16, BUN 74, creatinine 5.35, and liver enzymes relatively within normal limits.  Serum ethanol level less than 10.  CBC notable for with his will count 14,600.  INR 0.9.  COVID-19/influenza PCR checked in the ED today and found to be negative.  Imaging and additional notable ED work-up: EKG shows sinus rhythm with heart rate 92, normal intervals, no evidence of T wave or ST changes, including no evidence of ST elevation.  Chest x-ray shows no evidence of acute cardiopulmonary process, including no evidence of infiltrate, edema, effusion, or pneumothorax.  Noncontrast CT head shows no  evidence of acute intracranial abnormality, while showing chronic right cerebellar and left occipital lobe infarcts, stable from prior imaging.  EDP consulted on-call nephrologist, Dr. Denna Haggard.   While in the ED,  the following were administered: Zofran 4 mg ODT x1, and normal saline running continuously at 400 cc/h.  Subsequently, the patient was admitted to the PCU for further evaluation management of acute hypoosmolar hyponatremia.     Review of Systems: As per HPI otherwise 10 point review of systems negative.   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) 09/19/2019   Hypertension    Neurologic deficit due to acute ischemic cerebrovascular accident (CVA) ([place]) 06/13/2019   Supraventricular tachycardia ([place])    Type 1 diabetes ([place])     Past Surgical History:  Procedure Laterality Date   AV FISTULA PLACEMENT Left 03/15/2021   Procedure: LEFT UPPER EXTREMITY ARTERIOVENOUS (AV) FISTULA CREATION;  Surgeon: Rosetta Posner, MD;  Location: [place];  Service: Vascular;  Laterality: Left;   BUBBLE STUDY  06/20/2019   Procedure: BUBBLE STUDY;  Surgeon: Fay Records, MD;  Location: [facility] ENDOSCOPY;  Service: Cardiovascular;;   TEE WITHOUT CARDIOVERSION N/A 06/20/2019   Procedure: TRANSESOPHAGEAL ECHOCARDIOGRAM (TEE);  Surgeon: Fay Records, MD;  Location: [facility] ENDOSCOPY;  Service: Cardiovascular;  Laterality: N/A;    Social History:  reports that he quit smoking about 15 months ago. His smoking use included cigarettes. He started smoking about 10 years ago. He smoked an average of .25 packs per day. He has never used smokeless tobacco. He reports that he does not currently use drugs after having used the following drugs: Marijuana. No history on file for alcohol use.   No Known Allergies  Family History  Problem Relation Age of Onset   Hypertension Mother    Diabetes Mother    Hyperlipidemia Mother    Diabetes Father     Family history reviewed and not pertinent    Prior to Admission medications   Medication Sig Start Date End Date Taking?  Authorizing Provider  acetaminophen (TYLENOL) 325 MG tablet Take 650 mg by mouth every 6 (six) hours as needed for moderate pain or headache.   Yes [provider]  amLODipine (NORVASC) 10 MG tablet Take 1 tablet (10 mg total) by mouth daily. 09/09/21  Yes Elsie Stain, MD  AURYXIA 1 GM 210 MG(Fe) tablet Take 1 tablet (210 mg total) by mouth 3 (three) times daily. 05/09/21  Yes Elsie Stain, MD  carvedilol (COREG) 25 MG tablet Take 1 tablet (25 mg total) by mouth 2 (two) times daily with a meal. 09/09/21 03/08/22 Yes Elsie Stain, MD  Cholecalciferol (VITAMIN D3) 50 MCG (2000 UT) TABS Take 200 Units by mouth daily.   Yes [provider]  clopidogrel (PLAVIX) 75 MG tablet Take 1 tablet (75 mg total) by mouth daily. 09/09/21  Yes Elsie Stain, MD  glucose blood (ONETOUCH VERIO) test strip Use as instructed to check blood sugar 3 times daily. Patient taking differently: Use as instructed to check blood sugar 4-5 times daily 01/08/21  Yes Elsie Stain, MD  hydrALAZINE (APRESOLINE) 100 MG tablet Take 1 tablet (100 mg total) by mouth 3 (three) times daily. 09/09/21  Yes Elsie Stain, MD  insulin aspart (NOVOLOG FLEXPEN) 100 UNIT/ML FlexPen Inject 5-30 Units into the skin See admin instructions.  Mac daily 30 units to include correction scale and snack coverage three times a day with meals 09/09/21  Yes Elsie Stain, MD  insulin glargine (LANTUS SOLOSTAR) 100 UNIT/ML Solostar Pen Inject 16 Units into the skin daily. 09/09/21  Yes Elsie Stain, MD  Insulin Pen Needle (PEN NEEDLES 3/16") 31G X 5 MM MISC To inject insulin with novolog flex pen, 3 times daily with meals. 09/09/21  Yes Elsie Stain, MD  pantoprazole (PROTONIX) 40 MG tablet Take 1 tablet (40 mg total) by mouth daily. For stomach protection 09/09/21  Yes Elsie Stain, MD  rosuvastatin (CRESTOR) 20 MG tablet TAKE 1 TABLET(20 MG) BY MOUTH DAILY Patient taking differently: Take 20 mg by mouth  daily. 09/09/21  Yes Elsie Stain, MD  sodium bicarbonate 650 MG tablet Take 650 mg by mouth 2 (two) times daily.   Yes [provider]  Continuous Blood Gluc Sensor (DEXCOM G6 SENSOR) MISC Use as instructed to check blood sugar 3 times daily. Patient not taking: No sig reported 04/15/21   Shamleffer, Melanie Crazier, MD  Continuous Blood Gluc Transmit (DEXCOM G6 TRANSMITTER) MISC Inject 1 Device into the skin as directed. Use as instructed to check blood sugar 3 times daily. Patient not taking: No sig reported 04/15/21   Shamleffer, Melanie Crazier, MD     Objective    Physical Exam: Vitals:   09/29/21 2204 09/29/21 2324 09/30/21 0015 09/30/21 0100  BP: (!) 169/84  (!) 163/90 (!) 172/83  Pulse: 92  92 84  Resp: _0 Temp: 98.5 F (36.9 C)     TempSrc: Oral     SpO2: 99%  99% 97%  Weight:  56.7 kg    Height:  _1  (1.6 m)      General: appears to be stated age; alert, oriented Skin: warm, dry, no rash Head:  AT/[place] Mouth:  Oral mucosa membranes appear dry, normal dentition Neck: supple; trachea midline Heart:  RRR; did not appreciate any M/R/G Lungs: CTAB, did not appreciate any wheezes, rales, or rhonchi Abdomen: + BS; soft, ND, NT Vascular: 2+ pedal pulses b/l; 2+ radial pulses b/l Extremities: 1-2+ edema in b/l LE's, no muscle wasting Neuro: strength and sensation intact in upper and lower extremities b/l    Labs on Admission: I have personally reviewed following labs and imaging studies  CBC: Recent Labs  Lab 09/29/21 2331 09/29/21 2351  WBC 14.6*  --   NEUTROABS 13.2*  --   HGB 12.2* 12.6*  HCT 33.0* 37.0*  MCV 82.1  --   PLT 226  --    Basic Metabolic Panel: Recent Labs  Lab 09/29/21 2331 09/29/21 2351  NA 113* 112*  K 3.9 4.0  CL 76* 77*  CO2 21*  --   GLUCOSE 175* 173*  BUN 34* 34*  CREATININE 5.35* 6.00*  CALCIUM 9.0  --    GFR: Estimated Creatinine Clearance: 13.8 mL/min (A) (by C-G formula based on SCr of 6 mg/dL  (H)). Liver Function Tests: Recent Labs  Lab 09/29/21 2331  AST 36  ALT 43  ALKPHOS 90  BILITOT 0.9  PROT 6.4*  ALBUMIN 3.9   No results for input(s): LIPASE, AMYLASE in the last 168 hours. No results for input(s): AMMONIA in the last 168 hours. Coagulation Profile: Recent Labs  Lab 09/29/21 2331  INR 0.9   Cardiac Enzymes: No results for input(s): CKTOTAL, CKMB, CKMBINDEX, TROPONINI in the last 168 hours. BNP (last 3 results) No results  for input(s): PROBNP in the last 8760 hours. HbA1C: No results for input(s): HGBA1C in the last 72 hours. CBG: Recent Labs  Lab 09/29/21 2317  GLUCAP 174*   Lipid Profile: No results for input(s): CHOL, HDL, LDLCALC, TRIG, CHOLHDL, LDLDIRECT in the last 72 hours. Thyroid Function Tests: No results for input(s): TSH, T4TOTAL, FREET4, T3FREE, THYROIDAB in the last 72 hours. Anemia Panel: No results for input(s): VITAMINB12, FOLATE, FERRITIN, TIBC, IRON, RETICCTPCT in the last 72 hours. Urine analysis:    Component Value Date/Time   COLORURINE YELLOW 09/23/2019 2026   APPEARANCEUR HAZY (A) 09/23/2019 2026   LABSPEC 1.015 09/23/2019 2026   PHURINE 5.0 09/23/2019 2026   GLUCOSEU >=500 (A) 09/23/2019 2026   HGBUR MODERATE (A) 09/23/2019 2026   BILIRUBINUR NEGATIVE 09/23/2019 2026   KETONESUR 20 (A) 09/23/2019 2026   PROTEINUR >=300 (A) 09/23/2019 2026   NITRITE NEGATIVE 09/23/2019 2026   LEUKOCYTESUR NEGATIVE 09/23/2019 2026    Radiological Exams on Admission: CT HEAD WO CONTRAST  Result Date: 09/30/2021 CLINICAL DATA:  Dizziness EXAM: CT HEAD WITHOUT CONTRAST TECHNIQUE: Contiguous axial images were obtained from the base of the skull through the vertex without intravenous contrast. COMPARISON:  01/11/2020 FINDINGS: Brain: Right cerebellar and left occipital lobe infarcts are noted, stable from the prior exam. No acute hemorrhage, acute infarction or space-occupying mass lesion is seen. Vascular: No hyperdense vessel or unexpected  calcification. Skull: Normal. Negative for fracture or focal lesion. Sinuses/Orbits: No acute finding. Other: None. IMPRESSION: No acute intracranial abnormality noted. Chronic infarcts as described. Electronically Signed   By: Inez Catalina M.D.   On: 09/30/2021 00:15   DG Chest Portable 1 View  Result Date: 09/30/2021 CLINICAL DATA:  Dizziness EXAM: PORTABLE CHEST 1 VIEW COMPARISON:  01/12/2020 FINDINGS: Check shadow is mildly prominent but accentuated by the portable technique. Lungs are clear bilaterally. No focal infiltrate or effusion is noted. No bony abnormality is seen. IMPRESSION: No acute abnormality noted. Electronically Signed   By: Inez Catalina M.D.   On: 09/30/2021 00:30     EKG: Independently reviewed, with result as described above.    Assessment/Plan   Nyeem Stoke is a 35 y.o. male with medical history significant for chronic ischemic infarcts involving the right cerebellar and left occipital lobes, type 1 diabetes, end-stage renal disease not yet on hemodialysis, hypertension, GERD, who is admitted to [facility] on 09/29/2021 with acute hyponatremia after presenting from home to [facility] ED complaining of nausea/vomiting.    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 Suspect an element of hypovolumeia, with suspected contribution from dehydration in the setting of recurrent nausea/vomiting over the course of the last 3 days as well as corresponding recent decline in oral  intake.  While dehydration from nausea/vomiting and diminished oral intake appears to be the more likely source of patient's acute hyponatremia, with additional contribution from an element of chronic volume overload in the setting of end-stage renal disease not yet on hemodialysis, the differential  also includes the possibility of a contribution from SIADH, including from a potential acute on chronic ischemic cerebellar infarct.  While this possibility appears clinically less likely relative to the above, given the patient's history of chronic right cerebellar and left occipital lobe infarcts, with an acute exacerbation likely to also involve nausea/vomiting and dizziness, will also check MRI brain to further evaluate for acute ischemic infarct, while acknowledging the presenting CT head showed no evidence of acute process.    In general, will provide gentle IV fluids to attend to suspected contribution from dehydration, while further evaluating for any additional contributing factors, including SIADH, as further detailed below. Will also TSH. No overt pharmacologic contributions. Of note, no evidence of associated acute focal neurologic deficits and no report of recent trauma.  Nephrology has been consulted, as above, with additional recs pending.     Plan: monitor strict I's and O's and daily weights.  check UA, random urine sodium, urine osmolality.  Check serum osmolality to confirm suspected hypoosmolar etiology.  Repeat BMP in the morning, with every 4 hours BMPs ordered through 1300 on 09/30/2021. Check TSH. Gentle IVF's in the form of 100 cc/hr, representing a decrease from rate of previously running continuous IV fluids to decrease risk for rapid correction, pending evaluation for underlying etiology leading to presenting acute hyponatremia. Check serum uric acid level, as SIADH can be associated with hypouricemia due to hyperuricuria.  Every 4 hours neurochecks ordered.  MRI brain, as  above.     #) Nausea/vomiting: Source of patient's nausea/vomiting itself is currently unclear. Will check MRI brain to further evaluate for acute stroke in the setting of chronic right cerebellar stroke.  Of note COVID-19 PCR found to be negative when checked earlier today.  We will also check urinary drug screen.  Plan: As needed IV Zofran.  Add on serum magnesium level.  Check urinary drug screen.  Further evaluation management presenting acute hyponatremia, as above.  Monitor strict I's and O's.  MRI brain, as above.  Check urinalysis.      #) Leukocytosis: Mildly elevated white blood cell count per presenting labs.  No evidence of acute underlying infection at this time, although will also check urinalysis to further evaluate for this possibility.  Suspect noninfectious contributions from hemoconcentration due to presenting dehydration, as established above, as well as inflammatory contribution given recurrent nausea/vomiting over the last few days.  Plan: Gentle IV fluids, as above.  Check urinalysis.  Repeat CBC with differential in the morning.      #) End-stage renal disease: The patient is likely disturbances from disease, conveys that he has not yet started hemodialysis, does not currently appear to have an AV fistula hemodialysis catheter.  Follows with nephrology as an outpatient, with patient reporting that his nephrologist could not as conveyed to him that he will likely need to begin receiving hemodialysis at approximately 6 months time from now.  He conveys that he continues to produce urine, and no evidence of acute volume overload at this time, although we will need to closely monitor for ensuing development of such, given initial plan for administration of IV fluids as initial management of presenting acute hyponatremia, as above.  Of note, he does have a mild anion gap metabolic acidosis, but does not appear uremic, and serum potassium level found to be nonelevated at 3.9.   Nephrology has been consulted, as above.  Plan: Nephrology consulted, as above.  Monitor strict I's and O's and daily weights.  Repeat CMP in  the morning.  Check serum magnesium and phosphorus levels.  Continue home oral sodium bicarbonate.       #) Type 1 diabetes mellitus: Documented history of such, on Lantus 16 units subcu daily as well as sliding scale NovoLog 3 times daily with meals.  Present blood sugar noted to be 175.  Plan: Resume home basal insulin.  Accu-Cheks before every meal and at bedtime with low-dose sliding scale insulin.       #) Hypertension: With likely contribution from end-stage renal disease.  Outpatient hypertensive regimen includes Norvasc and hydralazine, and Coreg.  Presenting systolic blood pressures in the 160s.  Plan: Resume home antihypertensive medications.  Close monitoring ensuing blood pressure via routine vital signs.      #) GERD: On Protonix as an outpatient.  Plan: Continue home PPI.    DVT prophylaxis: scd's  Code Status: Full code Family Communication: none Disposition Plan: Per Rounding Team Consults called: nephrology consulted, as above;   Admission status: Inpatient; PCU   Of note, this patient was added by me to the following Admit List/Treatment Team:  mcadmits.   Of note, the Adult Admission Order Set (Multimorbid order set) was used by me in the admission process for this patient.  PLEASE NOTE THAT DRAGON DICTATION SOFTWARE WAS USED IN THE CONSTRUCTION OF THIS NOTE.   Rhetta Mura DO [facility] Pager [phone] From [place]   09/30/2021, 1:35 AM

## 2021-09-30 NOTE — ED Provider Notes (Signed)
[facility] EMERGENCY DEPARTMENT Provider Note   CSN: [id number] Arrival date & time: 09/29/21  2202     History Chief Complaint  Patient presents with   Dizziness   Emesis    George [name] is a 35 y.o. male.  The history is provided by the patient.  Dizziness Quality:  Imbalance Severity:  Moderate Onset quality:  Gradual Duration:  3 days Timing:  Constant Progression:  Unchanged Chronicity:  Recurrent Context: not with loss of consciousness   Relieved by:  Nothing Worsened by:  Nothing Ineffective treatments:  None tried Associated symptoms: nausea and vomiting   Associated symptoms: no chest pain, no diarrhea, no headaches, no hearing loss, no syncope, no tinnitus and no vision changes   Risk factors: hx of stroke   Emesis Associated symptoms: no diarrhea, no fever and no headaches       Past Medical History:  Diagnosis Date   AKI (acute kidney injury) ([place])    Cerebral infarction ([place])    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) 09/19/2019   Hypertension    Neurologic deficit due to acute ischemic cerebrovascular accident (CVA) ([place]) 06/13/2019   Supraventricular tachycardia ([place])    Type 1 diabetes ([place])     Patient Active Problem List   Diagnosis Date Noted   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 ([place]) 02/07/2020   Type 1 diabetes mellitus with hyperglycemia ([place]) 02/07/2020   Chronic renal disease, stage V ([place]) 02/07/2020   Anemia    Proliferative diabetic retinopathy associated with type 1 diabetes mellitus ([place]) 12/19/2019   Former tobacco use 06/27/2019   Hyperlipidemia due to type 1 diabetes mellitus ([place]) 06/27/2019   HTN (hypertension)    History of CVA (cerebrovascular accident) 06/13/2019    Past Surgical History:  Procedure Laterality Date   AV FISTULA PLACEMENT Left 03/15/2021   Procedure: LEFT UPPER EXTREMITY ARTERIOVENOUS (AV) FISTULA CREATION;   Surgeon: Rosetta Posner, MD;  Location: [facility];  Service: Vascular;  Laterality: Left;   BUBBLE STUDY  06/20/2019   Procedure: BUBBLE STUDY;  Surgeon: Fay Records, MD;  Location: [facility];  Service: Cardiovascular;;   TEE WITHOUT CARDIOVERSION N/A 06/20/2019   Procedure: TRANSESOPHAGEAL ECHOCARDIOGRAM (TEE);  Surgeon: Fay Records, MD;  Location: [facility];  Service: Cardiovascular;  Laterality: N/A;       Family History  Problem Relation Age of Onset   Hypertension Mother    Diabetes Mother    Hyperlipidemia Mother    Diabetes Father     Social History   Tobacco Use   Smoking status: Former    Packs/day: 0.25    Types: Cigarettes    Start date: 12/29/2010    Quit date: 06/25/2020    Years since quitting: 1.2   Smokeless tobacco: Never  Vaping Use   Vaping Use: Former   Start date: 12/30/2015   Quit date: 12/29/2017  Substance Use Topics   Drug use: Not Currently    Types: Marijuana    Home Medications Prior to Admission medications   Medication Sig Start Date End Date Taking? Authorizing Provider  amLODipine (NORVASC) 10 MG tablet Take 1 tablet (10 mg total) by mouth daily. 09/09/21   Elsie Stain, MD  AURYXIA 1 GM 210 MG(Fe) tablet Take 1 tablet (210 mg total) by mouth 3 (three) times daily. 05/09/21   Elsie Stain, MD  carvedilol (COREG) 25 MG tablet Take 1 tablet (25 mg total)  by mouth 2 (two) times daily with a meal. 09/09/21 03/08/22  Elsie Stain, MD  Cholecalciferol (VITAMIN D3) 50 MCG (2000 UT) TABS Take 200 Units by mouth daily.    [provider]  clopidogrel (PLAVIX) 75 MG tablet Take 1 tablet (75 mg total) by mouth daily. 09/09/21   Elsie Stain, MD  Continuous Blood Gluc Sensor (DEXCOM G6 SENSOR) MISC Use as instructed to check blood sugar 3 times daily. 04/15/21   Shamleffer, Melanie Crazier, MD  Continuous Blood Gluc Transmit (DEXCOM G6 TRANSMITTER) MISC Inject 1 Device into the skin as directed. Use as instructed to check blood sugar  3 times daily. 04/15/21   Shamleffer, Melanie Crazier, MD  glucose blood (ONETOUCH VERIO) test strip Use as instructed to check blood sugar 3 times daily. 01/08/21   Elsie Stain, MD  hydrALAZINE (APRESOLINE) 100 MG tablet Take 1 tablet (100 mg total) by mouth 3 (three) times daily. 09/09/21   Elsie Stain, MD  insulin aspart (NOVOLOG FLEXPEN) 100 UNIT/ML FlexPen Inject 5-30 Units into the skin See admin instructions. Mac daily 30 units to include correction scale and snack coverage three times a day with meals 09/09/21   Elsie Stain, MD  insulin glargine (LANTUS SOLOSTAR) 100 UNIT/ML Solostar Pen Inject 16 Units into the skin daily. 09/09/21   Elsie Stain, MD  Insulin Pen Needle (PEN NEEDLES 3/16") 31G X 5 MM MISC To inject insulin with novolog flex pen, 3 times daily with meals. 09/09/21   Elsie Stain, MD  pantoprazole (PROTONIX) 40 MG tablet Take 1 tablet (40 mg total) by mouth daily. For stomach protection 09/09/21   Elsie Stain, MD  rosuvastatin (CRESTOR) 20 MG tablet TAKE 1 TABLET(20 MG) BY MOUTH DAILY 09/09/21   Elsie Stain, MD  sodium bicarbonate 650 MG tablet Take 650 mg by mouth 2 (two) times daily.    [provider]    Allergies    Patient has no known allergies.  Review of Systems   Review of Systems  Constitutional:  Negative for fever.  HENT:  Negative for hearing loss and tinnitus.   Eyes:  Negative for redness.  Respiratory:  Negative for wheezing and stridor.   Cardiovascular:  Positive for leg swelling. Negative for chest pain and syncope.  Gastrointestinal:  Positive for nausea and vomiting. Negative for diarrhea.  Genitourinary:  Negative for difficulty urinating.  Musculoskeletal:  Negative for neck pain.  Skin:  Negative for rash.  Neurological:  Positive for dizziness and syncope. Negative for tremors, facial asymmetry, speech difficulty, numbness and headaches.  Psychiatric/Behavioral:  Negative for agitation.   All other  systems reviewed and are negative.  Physical Exam Updated Vital Signs BP (!) 169/84 (BP Location: Left Arm)   Pulse 92   Temp 98.5 F (36.9 C) (Oral)   Resp 16   Ht _0  (1.6 m)   Wt 56.7 kg   SpO2 99%   BMI 22.14 kg/m   Physical Exam Vitals and nursing note reviewed.  Constitutional:      General: He is not in acute distress.    Appearance: Normal appearance.  HENT:     Head: Normocephalic and atraumatic.     Nose: Nose normal.     Mouth/Throat:     Mouth: Mucous membranes are moist.  Eyes:     Extraocular Movements: Extraocular movements intact.     Conjunctiva/sclera: Conjunctivae normal.     Pupils: Pupils are equal, round, and reactive to  light.  Cardiovascular:     Rate and Rhythm: Normal rate and regular rhythm.     Pulses: Normal pulses.     Heart sounds: Normal heart sounds.  Pulmonary:     Effort: Pulmonary effort is normal.     Breath sounds: Normal breath sounds.  Abdominal:     General: Abdomen is flat. Bowel sounds are normal.     Palpations: Abdomen is soft.     Tenderness: There is no abdominal tenderness. There is no guarding or rebound.  Musculoskeletal:     Cervical back: Normal range of motion and neck supple.     Right lower leg: Edema present.     Left lower leg: Edema present.  Skin:    General: Skin is warm and dry.     Capillary Refill: Capillary refill takes less than 2 seconds.  Neurological:     General: No focal deficit present.     Mental Status: He is alert.     Deep Tendon Reflexes: Reflexes normal.  Psychiatric:        Mood and Affect: Mood normal.        Behavior: Behavior normal.    ED Results / Procedures / Treatments   Labs (all labs ordered are listed, but only abnormal results are displayed) Results for orders placed or performed during the hospital encounter of 09/29/21  Ethanol  Result Value Ref Range   Alcohol, Ethyl (B) <10 <10 mg/dL  Protime-INR  Result Value Ref Range   Prothrombin Time 12.1 11.4 - 15.2  seconds   INR 0.9 0.8 - 1.2  APTT  Result Value Ref Range   aPTT 26 24 - 36 seconds  Comprehensive metabolic panel  Result Value Ref Range   Sodium 113 (LL) 135 - 145 mmol/L   Potassium 3.9 3.5 - 5.1 mmol/L   Chloride 76 (L) 98 - 111 mmol/L   CO2 21 (L) 22 - 32 mmol/L   Glucose, Bld 175 (H) 70 - 99 mg/dL   BUN 34 (H) 6 - 20 mg/dL   Creatinine, Ser 5.35 (H) 0.61 - 1.24 mg/dL   Calcium 9.0 8.9 - 10.3 mg/dL   Total Protein 6.4 (L) 6.5 - 8.1 g/dL   Albumin 3.9 3.5 - 5.0 g/dL   AST 36 15 - 41 U/L   ALT 43 0 - 44 U/L   Alkaline Phosphatase 90 38 - 126 U/L   Total Bilirubin 0.9 0.3 - 1.2 mg/dL   GFR, Estimated 13 (L) >60 mL/min   Anion gap 16 (H) 5 - 15  CBG monitoring, ED  Result Value Ref Range   Glucose-Capillary 174 (H) 70 - 99 mg/dL  I-stat chem 8, ED  Result Value Ref Range   Sodium 112 (LL) 135 - 145 mmol/L   Potassium 4.0 3.5 - 5.1 mmol/L   Chloride 77 (L) 98 - 111 mmol/L   BUN 34 (H) 6 - 20 mg/dL   Creatinine, Ser 6.00 (H) 0.61 - 1.24 mg/dL   Glucose, Bld 173 (H) 70 - 99 mg/dL   Calcium, Ion 1.01 (L) 1.15 - 1.40 mmol/L   TCO2 23 22 - 32 mmol/L   Hemoglobin 12.6 (L) 13.0 - 17.0 g/dL   HCT 37.0 (L) 39.0 - 52.0 %   Comment NOTIFIED PHYSICIAN    CT HEAD WO CONTRAST  Result Date: 09/30/2021 CLINICAL DATA:  Dizziness EXAM: CT HEAD WITHOUT CONTRAST TECHNIQUE: Contiguous axial images were obtained from the base of the skull through the vertex without intravenous contrast.  COMPARISON:  01/11/2020 FINDINGS: Brain: Right cerebellar and left occipital lobe infarcts are noted, stable from the prior exam. No acute hemorrhage, acute infarction or space-occupying mass lesion is seen. Vascular: No hyperdense vessel or unexpected calcification. Skull: Normal. Negative for fracture or focal lesion. Sinuses/Orbits: No acute finding. Other: None. IMPRESSION: No acute intracranial abnormality noted. Chronic infarcts as described. Electronically Signed   By: Inez Catalina M.D.   On: 09/30/2021  00:15   DG Chest Portable 1 View  Result Date: 09/30/2021 CLINICAL DATA:  Dizziness EXAM: PORTABLE CHEST 1 VIEW COMPARISON:  01/12/2020 FINDINGS: Check shadow is mildly prominent but accentuated by the portable technique. Lungs are clear bilaterally. No focal infiltrate or effusion is noted. No bony abnormality is seen. IMPRESSION: No acute abnormality noted. Electronically Signed   By: Inez Catalina M.D.   On: 09/30/2021 00:30    EKG  EKG Interpretation  Date/Time:  Sunday September 29 2021 23:15:13 EDT Ventricular Rate:  92 PR Interval:  174 QRS Duration: 94 QT Interval:  370 QTC Calculation: 457 R Axis:   74 Text Interpretation: Normal sinus rhythm Confirmed by Randal Buba, Lisa Milian ([id number]) on 09/30/2021 1:06:42 AM         Radiology CT HEAD WO CONTRAST  Result Date: 09/30/2021 CLINICAL DATA:  Dizziness EXAM: CT HEAD WITHOUT CONTRAST TECHNIQUE: Contiguous axial images were obtained from the base of the skull through the vertex without intravenous contrast. COMPARISON:  01/11/2020 FINDINGS: Brain: Right cerebellar and left occipital lobe infarcts are noted, stable from the prior exam. No acute hemorrhage, acute infarction or space-occupying mass lesion is seen. Vascular: No hyperdense vessel or unexpected calcification. Skull: Normal. Negative for fracture or focal lesion. Sinuses/Orbits: No acute finding. Other: None. IMPRESSION: No acute intracranial abnormality noted. Chronic infarcts as described. Electronically Signed   By: Inez Catalina M.D.   On: 09/30/2021 00:15   DG Chest Portable 1 View  Result Date: 09/30/2021 CLINICAL DATA:  Dizziness EXAM: PORTABLE CHEST 1 VIEW COMPARISON:  01/12/2020 FINDINGS: Check shadow is mildly prominent but accentuated by the portable technique. Lungs are clear bilaterally. No focal infiltrate or effusion is noted. No bony abnormality is seen. IMPRESSION: No acute abnormality noted. Electronically Signed   By: Inez Catalina M.D.   On: 09/30/2021 00:30     Procedures Procedures   Medications Ordered in ED Medications  0.9 %  sodium chloride infusion (has no administration in time range)  ondansetron (ZOFRAN-ODT) disintegrating tablet 4 mg (4 mg Oral Given 09/29/21 2343)    ED Course  I have reviewed the triage vital signs and the nursing notes.  Pertinent labs & imaging results that were available during my care of the patient were reviewed by me and considered in my medical decision making (see chart for details).   Dizziness likely secondary to hyponatremia, will need admission for correction and discussion of dialysis.    George [name] was evaluated in Emergency Department on 09/30/2021 for the symptoms described in the history of present illness. He was evaluated in the context of the global COVID-19 pandemic, which necessitated consideration that the patient might be at risk for infection with the SARS-CoV-2 virus that causes COVID-19. Institutional protocols and algorithms that pertain to the evaluation of patients at risk for COVID-19 are in a state of rapid change based on information released by regulatory bodies including the CDC and federal and state organizations. These policies and algorithms were followed during the patient's care in the ED.  Final Clinical Impression(s) / ED Diagnoses Final  diagnoses:  Dizziness  Hyponatremia   Admit to medicine   Rx / DC Orders ED Discharge Orders     None        Margurete Guaman, MD 09/30/21 [id number]

## 2021-10-01 DIAGNOSIS — E871 Hypo-osmolality and hyponatremia: Secondary | ICD-10-CM | POA: Diagnosis not present

## 2021-10-01 DIAGNOSIS — I120 Hypertensive chronic kidney disease with stage 5 chronic kidney disease or end stage renal disease: Secondary | ICD-10-CM | POA: Diagnosis not present

## 2021-10-01 DIAGNOSIS — N185 Chronic kidney disease, stage 5: Secondary | ICD-10-CM | POA: Diagnosis not present

## 2021-10-01 DIAGNOSIS — D649 Anemia, unspecified: Secondary | ICD-10-CM | POA: Diagnosis not present

## 2021-10-01 DIAGNOSIS — R112 Nausea with vomiting, unspecified: Secondary | ICD-10-CM | POA: Diagnosis not present

## 2021-10-01 LAB — BASIC METABOLIC PANEL
Anion gap: 8 (ref 5–15)
Anion gap: 10 (ref 5–15)
Anion gap: 9 (ref 5–15)
BUN: 37 mg/dL — ABNORMAL HIGH (ref 6–20)
BUN: 36 mg/dL — ABNORMAL HIGH (ref 6–20)
BUN: 35 mg/dL — ABNORMAL HIGH (ref 6–20)
CO2: 22 mmol/L (ref 22–32)
CO2: 22 mmol/L (ref 22–32)
CO2: 24 mmol/L (ref 22–32)
Calcium: 8.2 mg/dL — ABNORMAL LOW (ref 8.9–10.3)
Calcium: 8.1 mg/dL — ABNORMAL LOW (ref 8.9–10.3)
Calcium: 8.6 mg/dL — ABNORMAL LOW (ref 8.9–10.3)
Chloride: 92 mmol/L — ABNORMAL LOW (ref 98–111)
Chloride: 93 mmol/L — ABNORMAL LOW (ref 98–111)
Chloride: 99 mmol/L (ref 98–111)
Creatinine, Ser: 5.52 mg/dL — ABNORMAL HIGH (ref 0.61–1.24)
Creatinine, Ser: 5.33 mg/dL — ABNORMAL HIGH (ref 0.61–1.24)
Creatinine, Ser: 5.54 mg/dL — ABNORMAL HIGH (ref 0.61–1.24)
GFR, Estimated: 13 mL/min — ABNORMAL LOW (ref >60)
GFR, Estimated: 14 mL/min — ABNORMAL LOW (ref >60)
GFR, Estimated: 13 mL/min — ABNORMAL LOW (ref >60)
Glucose, Bld: 141 mg/dL — ABNORMAL HIGH (ref 70–99)
Glucose, Bld: 137 mg/dL — ABNORMAL HIGH (ref 70–99)
Glucose, Bld: 58 mg/dL — ABNORMAL LOW (ref 70–99)
Potassium: 3.7 mmol/L (ref 3.5–5.1)
Potassium: 3.3 mmol/L — ABNORMAL LOW (ref 3.5–5.1)
Potassium: 3.7 mmol/L (ref 3.5–5.1)
Sodium: 122 mmol/L — ABNORMAL LOW (ref 135–145)
Sodium: 125 mmol/L — ABNORMAL LOW (ref 135–145)
Sodium: 132 mmol/L — ABNORMAL LOW (ref 135–145)

## 2021-10-01 LAB — CBC
HCT: 24.4 % — ABNORMAL LOW (ref 39.0–52.0)
Hemoglobin: 8.8 g/dL — ABNORMAL LOW (ref 13.0–17.0)
MCH: 31 pg (ref 26.0–34.0)
MCHC: 36.1 g/dL — ABNORMAL HIGH (ref 30.0–36.0)
MCV: 85.9 fL (ref 80.0–100.0)
Platelets: 145 10*3/uL — ABNORMAL LOW (ref 150–400)
RBC: 2.84 MIL/uL — ABNORMAL LOW (ref 4.22–5.81)
RDW: 12.9 % (ref 11.5–15.5)
WBC: 6.2 10*3/uL (ref 4.0–10.5)
nRBC: 0 % (ref 0.0–0.2)

## 2021-10-01 LAB — CBG MONITORING, ED
Glucose-Capillary: 142 mg/dL — ABNORMAL HIGH (ref 70–99)
Glucose-Capillary: 202 mg/dL — ABNORMAL HIGH (ref 70–99)
Glucose-Capillary: 109 mg/dL — ABNORMAL HIGH (ref 70–99)

## 2021-10-01 LAB — IRON AND TIBC
Iron: 43 ug/dL — ABNORMAL LOW (ref 45–182)
Saturation Ratios: 17 % — ABNORMAL LOW (ref 17.9–39.5)
TIBC: 255 ug/dL (ref 250–450)
UIBC: 212 ug/dL

## 2021-10-01 LAB — MAGNESIUM: Magnesium: 2.1 mg/dL (ref 1.7–2.4)

## 2021-10-01 LAB — FERRITIN: Ferritin: 155 ng/mL (ref 24–336)

## 2021-10-01 MED ORDER — POTASSIUM CHLORIDE CRYS ER 20 MEQ PO TBCR
20.0000 meq | EXTENDED_RELEASE_TABLET | Freq: Once | ORAL | Status: AC
  Administered 2021-10-01: 20 meq via ORAL
  Filled 2021-10-01: qty 1

## 2021-10-01 NOTE — Progress Notes (Signed)
PROGRESS NOTE    [facility]  MZ:[id number] DOB: Sep 10, 1986 DOA: 09/29/2021 PCP: Elsie Stain, MD   Brief Narrative:  35 year old with history of chronic infarcts/CVA, DM 1, CKD stage V, GERD admitted to the hospital for nausea vomiting and severe hyponatremia.  He was found to have serum sodium of 113, nephrology was consulted and was started on IV fluids.  Sodium level slowly improved to 125.   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  This is improved with normal saline.  This morning 125. Continue NS.  - Nephro consulted.   Nausea vomiting - resolved  Leukocytosis - Likely reactive.  CKD stage V; Cr 5.18 - Nephrology consulted, not yet on dialysis.  On bicarb  Diabetes mellitus type 1 - Continue insulin and sliding scale.  Basal insulin 16 units daily  Essential hypertension - On Coreg and amlodipine  Previous history of CVA - On Plavix and statin  GERD - Daily Protonix  Hyperlipidemia - Crestor    DVT prophylaxis: SCDs Start: 09/30/21 0134 Code Status: Full code Family Communication: Mother at bedside  Status is: Inpatient  Remains inpatient appropriate because:Inpatient level of care appropriate due to severity of illness.  Continue normal saline and closely monitor sodium level.  Anticipated should be in 130s range tomorrow and will be stable for discharge thereafter.  He seems to be tolerating orals now  Dispo: The patient is from: Home              Anticipated d/c is to: Home              Patient currently is not medically stable to d/c.   Difficult to place patient  No          Subjective: Seen and examined at bedside, no complaints this morning.  Mom is present at bedside as well.  Patient overall feels back to his baseline but certainly worried about his sodium levels  Review of Systems Otherwise negative except as per HPI, including: General: Denies fever, chills, night sweats or unintended weight loss. Resp: Denies cough, wheezing, shortness of breath. Cardiac: Denies chest pain, palpitations, orthopnea, paroxysmal nocturnal dyspnea. GI: Denies abdominal pain, nausea, vomiting, diarrhea or constipation GU: Denies dysuria, frequency, hesitancy or incontinence MS: Denies muscle aches, joint pain or swelling Neuro: Denies headache, neurologic deficits (focal weakness, numbness, tingling), abnormal gait Psych: Denies anxiety, depression, SI/HI/AVH Skin: Denies new rashes or lesions ID: Denies sick contacts, exotic exposures, travel  Examination:  Constitutional: Not in acute distress Respiratory: Clear to auscultation bilaterally Cardiovascular: Normal sinus rhythm, no rubs Abdomen: Nontender nondistended good bowel sounds Musculoskeletal: No edema noted Skin: No rashes seen Neurologic: CN 2-12 grossly intact.  And nonfocal Psychiatric: Normal judgment and insight. Alert and oriented x 3. Normal mood.  Objective: Vitals:   10/01/21 0800 10/01/21 0945 10/01/21 1000 10/01/21 1200  BP: (!) 163/90 (!) 169/82 (!) 162/85 (!) 148/82  Pulse: 100  92 88  Resp: (!) 22  10   Temp:      TempSrc:      SpO2: 100%  96% 98%  Weight:  Height:        Intake/Output Summary (Last 24 hours) at 10/01/2021 1223 Last data filed at 10/01/2021 1035 Gross per 24 hour  Intake 900 ml  Output --  Net 900 ml   Filed Weights   09/29/21 2324  Weight: 56.7 kg     Data Reviewed:   CBC: Recent Labs  Lab 09/29/21 2331 09/29/21 2351 09/30/21 0356 10/01/21 0347  WBC 14.6*  --  11.1* 6.2  NEUTROABS 13.2*  --   --   --   HGB 12.2* 12.6* 10.3*  8.8*  HCT 33.0* 37.0* 28.3* 24.4*  MCV 82.1  --  83.0 85.9  PLT 226  --  174 [id number]*   Basic Metabolic Panel: Recent Labs  Lab 09/30/21 0356 09/30/21 1340 09/30/21 1621 09/30/21 2250 10/01/21 0347  NA 114* 119* 121* 122* 125*  K 3.7 3.3* 3.3* 3.7 3.3*  CL 80* 87* 89* 92* 93*  CO2 20* 21* '22 22 22  '$ GLUCOSE 232* 146* 162* 141* 137*  BUN 35* 34* 36* 37* 36*  CREATININE 5.18* 5.30* 5.50* 5.52* 5.33*  CALCIUM 8.0* 8.2* 8.1* 8.2* 8.1*  MG 1.9  --   --   --  2.1  PHOS 3.8  --   --   --   --    GFR: Estimated Creatinine Clearance: 15.5 mL/min (A) (by C-G formula based on SCr of 5.33 mg/dL (H)). Liver Function Tests: Recent Labs  Lab 09/29/21 2331 09/30/21 0356  AST 36 28  ALT 43 36  ALKPHOS 90 78  BILITOT 0.9 0.8  PROT 6.4* 5.6*  ALBUMIN 3.9 3.3*   No results for input(s): LIPASE, AMYLASE in the last 168 hours. No results for input(s): AMMONIA in the last 168 hours. Coagulation Profile: Recent Labs  Lab 09/29/21 2331  INR 0.9   Cardiac Enzymes: No results for input(s): CKTOTAL, CKMB, CKMBINDEX, TROPONINI in the last 168 hours. BNP (last 3 results) No results for input(s): PROBNP in the last 8760 hours. HbA1C: No results for input(s): HGBA1C in the last 72 hours. CBG: Recent Labs  Lab 09/30/21 1251 09/30/21 1654 09/30/21 2157 10/01/21 0708 10/01/21 1106  GLUCAP 155* 159* 140* 142* 202*   Lipid Profile: No results for input(s): CHOL, HDL, LDLCALC, TRIG, CHOLHDL, LDLDIRECT in the last 72 hours. Thyroid Function Tests: Recent Labs    09/30/21 0356  TSH 1.826   Anemia Panel: No results for input(s): VITAMINB12, FOLATE, FERRITIN, TIBC, IRON, RETICCTPCT in the last 72 hours. Sepsis Labs: No results for input(s): PROCALCITON, LATICACIDVEN in the last 168 hours.  Recent Results (from the past 240 hour(s))  Resp Panel by RT-PCR (Flu A&B, Covid) Nasopharyngeal Swab     Status: None   Collection Time: 09/30/21 12:44 AM   Specimen: Nasopharyngeal Swab;  Nasopharyngeal(NP) swabs in vial transport medium  Result Value Ref Range Status   SARS Coronavirus 2 by RT PCR NEGATIVE NEGATIVE Final    Comment: (NOTE) SARS-CoV-2 target nucleic acids are NOT DETECTED.  The SARS-CoV-2 RNA is generally detectable in upper respiratory specimens during the acute phase of infection. The lowest concentration of SARS-CoV-2 viral copies this assay can detect is 138 copies/mL. A negative result does not preclude SARS-Cov-2 infection and should not be used as the sole basis for treatment or other patient management decisions. A negative result may occur with  improper specimen collection/handling, submission of specimen other than nasopharyngeal swab, presence of viral mutation(s) within the areas targeted by this assay, and inadequate number of viral copies(<138 copies/mL).  A negative result must be combined with clinical observations, patient history, and epidemiological information. The expected result is Negative.  Fact Sheet for Patients:  EntrepreneurPulse.com.au  Fact Sheet for Healthcare Providers:  IncredibleEmployment.be  This test is no t yet approved or cleared by the [place] FDA and  has been authorized for detection and/or diagnosis of SARS-CoV-2 by FDA under an Emergency Use Authorization (EUA). This EUA will remain  in effect (meaning this test can be used) for the duration of the COVID-19 declaration under Section 564(b)(1) of the Act, 21 U.S.C.section 360bbb-3(b)(1), unless the authorization is terminated  or revoked sooner.       Influenza A by PCR NEGATIVE NEGATIVE Final   Influenza B by PCR NEGATIVE NEGATIVE Final    Comment: (NOTE) The Xpert Xpress SARS-CoV-2/FLU/RSV plus assay is intended as an aid in the diagnosis of influenza from Nasopharyngeal swab specimens and should not be used as a sole basis for treatment. Nasal washings and aspirates are unacceptable for Xpert Xpress  SARS-CoV-2/FLU/RSV testing.  Fact Sheet for Patients: EntrepreneurPulse.com.au  Fact Sheet for Healthcare Providers: IncredibleEmployment.be  This test is not yet approved or cleared by the [place] FDA and has been authorized for detection and/or diagnosis of SARS-CoV-2 by FDA under an Emergency Use Authorization (EUA). This EUA will remain in effect (meaning this test can be used) for the duration of the COVID-19 declaration under Section 564(b)(1) of the Act, 21 U.S.C. section 360bbb-3(b)(1), unless the authorization is terminated or revoked.  Performed at [facility], [address], [place], [place] [address]          Radiology Studies: CT HEAD WO CONTRAST  Result Date: 09/30/2021 CLINICAL DATA:  Dizziness EXAM: CT HEAD WITHOUT CONTRAST TECHNIQUE: Contiguous axial images were obtained from the base of the skull through the vertex without intravenous contrast. COMPARISON:  01/11/2020 FINDINGS: Brain: Right cerebellar and left occipital lobe infarcts are noted, stable from the prior exam. No acute hemorrhage, acute infarction or space-occupying mass lesion is seen. Vascular: No hyperdense vessel or unexpected calcification. Skull: Normal. Negative for fracture or focal lesion. Sinuses/Orbits: No acute finding. Other: None. IMPRESSION: No acute intracranial abnormality noted. Chronic infarcts as described. Electronically Signed   By: Inez Catalina M.D.   On: 09/30/2021 00:15   MR BRAIN WO CONTRAST  Result Date: 09/30/2021 CLINICAL DATA:  Nonspecific dizziness.  Nausea and vomiting. EXAM: MRI HEAD WITHOUT CONTRAST TECHNIQUE: Multiplanar, multiecho pulse sequences of the brain and surrounding structures were obtained without intravenous contrast. COMPARISON:  Head CT same day.  MRI 01/12/2020. FINDINGS: Brain: Diffusion imaging does not show any acute or subacute infarction. Mild chronic small-vessel ischemic changes affect the pons.  Numerous old small vessel cerebellar infarctions as seen previously. Old left PCA territory infarction affecting the posteromedial temporal lobe and occipital lobe with atrophy, encephalomalacia and gliosis. Old lacunar infarction of both thalami. Old lacunar infarction of the right caudate. Mild small vessel ischemic changes elsewhere affecting the cerebral hemispheric white matter. No mass lesion, recent hemorrhage, hydrocephalus or extra-axial collection. Some chronic hemosiderin deposition associated with the old left PCA territory infarction. Vascular: Major vessels at the base of the brain show flow. Skull and upper cervical spine: Negative Sinuses/Orbits: Clear/normal Other: None IMPRESSION: No acute finding. Numerous old small vessel cerebellar infarctions. Mild chronic small-vessel ischemic change of the pons. Old left PCA territory infarction affecting the posteromedial temporal lobe and occipital lobe. Old lacunar infarctions of the thalami and right caudate. Electronically Signed  By: Nelson Chimes M.D.   On: 09/30/2021 08:11   DG Chest Portable 1 View  Result Date: 09/30/2021 CLINICAL DATA:  Dizziness EXAM: PORTABLE CHEST 1 VIEW COMPARISON:  01/12/2020 FINDINGS: Check shadow is mildly prominent but accentuated by the portable technique. Lungs are clear bilaterally. No focal infiltrate or effusion is noted. No bony abnormality is seen. IMPRESSION: No acute abnormality noted. Electronically Signed   By: Inez Catalina M.D.   On: 09/30/2021 00:30        Scheduled Meds:  amLODipine  10 mg Oral Daily   carvedilol  25 mg Oral BID WC   clopidogrel  75 mg Oral Daily   hydrALAZINE  100 mg Oral TID   insulin aspart  0-6 Units Subcutaneous TID WC   insulin glargine-yfgn  16 Units Subcutaneous Daily   pantoprazole  40 mg Oral Daily   rosuvastatin  20 mg Oral Daily   sodium bicarbonate  650 mg Oral BID   Continuous Infusions:  sodium chloride 100 mL/hr at 10/01/21 1105     LOS: 1 day   Time  spent= 35 mins    Yenty Bloch Arsenio Loader, MD [facility]  If 7PM-7AM, please contact night-coverage  10/01/2021, 12:23 PM

## 2021-10-01 NOTE — ED Notes (Signed)
Patient Alert and oriented to baseline. Stable and ambulatory to baseline. Patient verbalized understanding of the discharge instructions.  Patient belongings were taken by the patient.   

## 2021-10-01 NOTE — Discharge Summary (Signed)
Physician Discharge Summary  [facility] DGU:[id number] DOB: 02-Dec-1986 DOA: 09/29/2021  PCP: Elsie Stain, MD  Admit date: 09/29/2021 Discharge date: 10/01/2021  Admitted From: Home Disposition: Home  Recommendations for Outpatient Follow-up:  Follow up with PCP in 1-2 weeks Please obtain BMP/CBC in 5 days Follow-up outpatient nephrology in 3-4 weeks   Discharge Condition: Stable CODE STATUS: Full code Diet recommendation: Diabetic  Brief/Interim Summary: 35 year old with history of chronic infarcts/CVA, DM 1, CKD stage V, GERD admitted to the hospital for nausea vomiting and severe hyponatremia.  He was found to have serum sodium of 113, nephrology was consulted and was started on IV fluids.  Over the course of hospital stay his sodium slowly improved and it was normal at 132.  Suspected this was secondary to dehydration and total body salt depletion.  He was seen by nephrology as well.  Today is medically stable to be discharged.     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  Improved with IV fluids, over 48 hours his sodium has normalized 232.  Seen by nephrology.  Stable for discharge.  He is tolerating orals   Nausea vomiting - resolved.  He is tolerating orals   Leukocytosis, resolved - Likely reactive.   CKD stage V; Cr 5.18 - Nephrology consulted, not yet on dialysis.  On bicarb   Diabetes mellitus type 1 - Resume home regimen  Essential hypertension - On Coreg and amlodipine   Previous history of CVA - On Plavix and statin   GERD - Daily Protonix  Hyperlipidemia -  Crestor      Body mass index is 22.14 kg/m.         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  Tolerating orals  Discharge Exam: Vitals:   10/01/21 1400 10/01/21 1623  BP: (!) 144/77 138/80  Pulse: 89 86  Resp:  14  Temp:    SpO2: 98% 97%   Vitals:   10/01/21 1200 10/01/21 1300 10/01/21 1400 10/01/21 1623  BP: (!) 148/82 (!) 145/82 (!) 144/77 138/80  Pulse: 88 88 89 86  Resp:  16  14  Temp:      TempSrc:      SpO2: 98% 98% 98% 97%  Weight:      Height:        General: Pt is alert, awake, not in acute distress Cardiovascular: RRR, S1/S2 +, no rubs, no gallops Respiratory: CTA bilaterally, no wheezing, no rhonchi Abdominal: Soft, NT, ND, bowel sounds + Extremities: no edema, no cyanosis  Discharge Instructions   Allergies as of 10/01/2021   No Known Allergies      Medication List     TAKE these medications    acetaminophen 325 MG tablet Commonly known as: TYLENOL Take  650 mg by mouth every 6 (six) hours as needed for moderate pain or headache.   amLODipine 10 MG tablet Commonly known as: NORVASC Take 1 tablet (10 mg total) by mouth daily.   Auryxia 1 GM 210 MG(Fe) tablet Generic drug: ferric citrate Take 1 tablet (210 mg total) by mouth 3 (three) times daily.   carvedilol 25 MG tablet Commonly known as: COREG Take 1 tablet (25 mg total) by mouth 2 (two) times daily with a meal.   clopidogrel 75 MG tablet Commonly known as: PLAVIX Take 1 tablet (75 mg total) by mouth daily.   Dexcom G6 Sensor Misc Use as instructed to check blood sugar 3 times daily.   Dexcom G6 Transmitter Misc Inject 1 Device into the  skin as directed. Use as instructed to check blood sugar 3 times daily.   hydrALAZINE 100 MG tablet Commonly known as: APRESOLINE Take 1 tablet (100 mg total) by mouth 3 (three) times daily.   Lantus SoloStar 100 UNIT/ML Solostar Pen Generic drug: insulin glargine Inject 16 Units into the skin daily.   NovoLOG FlexPen 100 UNIT/ML FlexPen Generic drug: insulin aspart Inject 5-30 Units into the skin See admin instructions. Mac daily 30 units to include correction scale and snack coverage three times a day with meals   OneTouch Verio test strip Generic drug: glucose blood Use as instructed to check blood sugar 3 times daily. What changed: additional instructions   pantoprazole 40 MG tablet Commonly known as: PROTONIX Take 1 tablet (40 mg total) by mouth daily. For stomach protection   Pen Needles 3/16" 31G X 5 MM Misc To inject insulin with novolog flex pen, 3 times daily with meals.   rosuvastatin 20 MG tablet Commonly known as: CRESTOR TAKE 1 TABLET(20 MG) BY MOUTH DAILY What changed:  how much to take how to take this when to take this additional instructions   sodium bicarbonate 650 MG tablet Take 650 mg by mouth 2 (two) times daily.   Vitamin D3 50 MCG (2000 UT) Tabs Take 200 Units by mouth daily.        Follow-up Information     Elsie Stain, MD Follow up in 1 week(s).   Specialty: Pulmonary Disease Contact information: [address] [address] [phone]         Kate Sable, MD .   Specialties: Cardiology, Radiology Contact information: [place] [address] [phone]                No Known Allergies  You were cared for by a hospitalist during your hospital stay. If you have any questions about your discharge medications or the care you received while you were in the hospital after you are discharged, you can call the unit and asked to speak with the hospitalist on call if the  hospitalist that took care of you is not available. Once you are discharged, your primary care physician will handle any further medical issues. Please note that no refills for any discharge medications will be authorized once you are discharged, as it is imperative that you return to your primary care physician (or establish a relationship with a primary care physician if you do not have one) for your aftercare needs so that they can reassess your need for medications and monitor your lab values.   Procedures/Studies: CT HEAD WO CONTRAST  Result Date: 09/30/2021 CLINICAL DATA:  Dizziness EXAM: CT HEAD WITHOUT CONTRAST TECHNIQUE: Contiguous axial images were obtained from  the base of the skull through the vertex without intravenous contrast. COMPARISON:  01/11/2020 FINDINGS: Brain: Right cerebellar and left occipital lobe infarcts are noted, stable from the prior exam. No acute hemorrhage, acute infarction or space-occupying mass lesion is seen. Vascular: No hyperdense vessel or unexpected calcification. Skull: Normal. Negative for fracture or focal lesion. Sinuses/Orbits: No acute finding. Other: None. IMPRESSION: No acute intracranial abnormality noted. Chronic infarcts as described. Electronically Signed   By: Inez Catalina M.D.   On: 09/30/2021 00:15   MR BRAIN WO CONTRAST  Result Date: 09/30/2021 CLINICAL DATA:  Nonspecific dizziness.  Nausea and vomiting. EXAM: MRI HEAD WITHOUT CONTRAST TECHNIQUE: Multiplanar, multiecho pulse sequences of the brain and surrounding structures were obtained without intravenous contrast. COMPARISON:  Head CT same day.  MRI 01/12/2020. FINDINGS: Brain: Diffusion imaging does not show any acute or subacute infarction. Mild chronic small-vessel ischemic changes affect the pons. Numerous old small vessel cerebellar infarctions as seen previously. Old left PCA territory infarction affecting the posteromedial temporal lobe and occipital lobe with atrophy, encephalomalacia and  gliosis. Old lacunar infarction of both thalami. Old lacunar infarction of the right caudate. Mild small vessel ischemic changes elsewhere affecting the cerebral hemispheric white matter. No mass lesion, recent hemorrhage, hydrocephalus or extra-axial collection. Some chronic hemosiderin deposition associated with the old left PCA territory infarction. Vascular: Major vessels at the base of the brain show flow. Skull and upper cervical spine: Negative Sinuses/Orbits: Clear/normal Other: None IMPRESSION: No acute finding. Numerous old small vessel cerebellar infarctions. Mild chronic small-vessel ischemic change of the pons. Old left PCA territory infarction affecting the posteromedial temporal lobe and occipital lobe. Old lacunar infarctions of the thalami and right caudate. Electronically Signed   By: Nelson Chimes M.D.   On: 09/30/2021 08:11   DG Chest Portable 1 View  Result Date: 09/30/2021 CLINICAL DATA:  Dizziness EXAM: PORTABLE CHEST 1 VIEW COMPARISON:  01/12/2020 FINDINGS: Check shadow is mildly prominent but accentuated by the portable technique. Lungs are clear bilaterally. No focal infiltrate or effusion is noted. No bony abnormality is seen. IMPRESSION: No acute abnormality noted. Electronically Signed   By: Inez Catalina M.D.   On: 09/30/2021 00:30     The results of significant diagnostics from this hospitalization (including imaging, microbiology, ancillary and laboratory) are listed below for reference.     Microbiology: Recent Results (from the past 240 hour(s))  Resp Panel by RT-PCR (Flu A&B, Covid) Nasopharyngeal Swab     Status: None   Collection Time: 09/30/21 12:44 AM   Specimen: Nasopharyngeal Swab; Nasopharyngeal(NP) swabs in vial transport medium  Result Value Ref Range Status   SARS Coronavirus 2 by RT PCR NEGATIVE NEGATIVE Final    Comment: (NOTE) SARS-CoV-2 target nucleic acids are NOT DETECTED.  The SARS-CoV-2 RNA is generally detectable in upper respiratory specimens  during the acute phase of infection. The lowest concentration of SARS-CoV-2 viral copies this assay can detect is 138 copies/mL. A negative result does not preclude SARS-Cov-2 infection and should not be used as the sole basis for treatment or other patient management decisions. A negative result may occur with  improper specimen collection/handling, submission of specimen other than nasopharyngeal swab, presence of viral mutation(s) within the areas targeted by this assay, and inadequate number of viral copies(<138 copies/mL). A negative result must be combined with clinical observations, patient history, and epidemiological information. The expected result is Negative.  Fact Sheet for Patients:  EntrepreneurPulse.com.au  Fact Sheet for Healthcare Providers:  IncredibleEmployment.be  This test  is no t yet approved or cleared by the [place] and  has been authorized for detection and/or diagnosis of SARS-CoV-2 by FDA under an Emergency Use Authorization (EUA). This EUA will remain  in effect (meaning this test can be used) for the duration of the COVID-19 declaration under Section 564(b)(1) of the Act, 21 U.S.C.section 360bbb-3(b)(1), unless the authorization is terminated  or revoked sooner.       Influenza A by PCR NEGATIVE NEGATIVE Final   Influenza B by PCR NEGATIVE NEGATIVE Final    Comment: (NOTE) The Xpert Xpress SARS-CoV-2/FLU/RSV plus assay is intended as an aid in the diagnosis of influenza from Nasopharyngeal swab specimens and should not be used as a sole basis for treatment. Nasal washings and aspirates are unacceptable for Xpert Xpress SARS-CoV-2/FLU/RSV testing.  Fact Sheet for Patients: EntrepreneurPulse.com.au  Fact Sheet for Healthcare Providers: IncredibleEmployment.be  This test is not yet approved or cleared by the [place] FDA and has been authorized for detection  and/or diagnosis of SARS-CoV-2 by FDA under an Emergency Use Authorization (EUA). This EUA will remain in effect (meaning this test can be used) for the duration of the COVID-19 declaration under Section 564(b)(1) of the Act, 21 U.S.C. section 360bbb-3(b)(1), unless the authorization is terminated or revoked.  Performed at [facility], [place] [address] [place], [place] [address]      Labs: BNP (last 3 results) No results for input(s): BNP in the last 8760 hours. Basic Metabolic Panel: Recent Labs  Lab 09/30/21 0356 09/30/21 1340 09/30/21 1621 09/30/21 2250 10/01/21 0347 10/01/21 1404  NA 114* 119* 121* 122* 125* 132*  K 3.7 3.3* 3.3* 3.7 3.3* 3.7  CL 80* 87* 89* 92* 93* 99  CO2 20* 21* _0 GLUCOSE 232* 146* 162* 141* 137* 58*  BUN 35* 34* 36* 37* 36* 35*  CREATININE 5.18* 5.30* 5.50* 5.52* 5.33* 5.54*  CALCIUM 8.0* 8.2* 8.1* 8.2* 8.1* 8.6*  MG 1.9  --   --   --  2.1  --   PHOS 3.8  --   --   --   --   --    Liver Function Tests: Recent Labs  Lab 09/29/21 2331 09/30/21 0356  AST 36 28  ALT 43 36  ALKPHOS 90 78  BILITOT 0.9 0.8  PROT 6.4* 5.6*  ALBUMIN 3.9 3.3*   No results for input(s): LIPASE, AMYLASE in the last 168 hours. No results for input(s): AMMONIA in the last 168 hours. CBC: Recent Labs  Lab 09/29/21 2331 09/29/21 2351 09/30/21 0356 10/01/21 0347  WBC 14.6*  --  11.1* 6.2  NEUTROABS 13.2*  --   --   --   HGB 12.2* 12.6* 10.3* 8.8*  HCT 33.0* 37.0* 28.3* 24.4*  MCV 82.1  --  83.0 85.9  PLT 226  --  174 145*   Cardiac Enzymes: No results for input(s): CKTOTAL, CKMB, CKMBINDEX, TROPONINI in the last 168 hours. BNP: Invalid input(s): POCBNP CBG: Recent Labs  Lab 09/30/21 1654 09/30/21 2157 10/01/21 0708 10/01/21 1106 10/01/21 1627  GLUCAP 159* 140* 142* 202* 109*   D-Dimer No results for input(s): DDIMER in the last 72 hours. Hgb A1c No results for input(s): HGBA1C in the last 72 hours. Lipid Profile No results for  input(s): CHOL, HDL, LDLCALC, TRIG, CHOLHDL, LDLDIRECT in the last 72 hours. Thyroid function studies Recent Labs    09/30/21 0356  TSH 1.826   Anemia work up [employer]  10/01/21 1404  FERRITIN 155  TIBC 255  IRON 43*   Urinalysis    Component Value Date/Time   COLORURINE STRAW (A) 09/30/2021 0405   APPEARANCEUR CLEAR 09/30/2021 0405   LABSPEC 1.004 (L) 09/30/2021 0405   PHURINE 6.0 09/30/2021 0405   GLUCOSEU 50 (A) 09/30/2021 0405   HGBUR SMALL (A) 09/30/2021 0405   BILIRUBINUR NEGATIVE 09/30/2021 0405   KETONESUR NEGATIVE 09/30/2021 0405   PROTEINUR 100 (A) 09/30/2021 0405   NITRITE NEGATIVE 09/30/2021 0405   LEUKOCYTESUR NEGATIVE 09/30/2021 0405   Sepsis Labs Invalid input(s): PROCALCITONIN,  WBC,  LACTICIDVEN Microbiology Recent Results (from the past 240 hour(s))  Resp Panel by RT-PCR (Flu A&B, Covid) Nasopharyngeal Swab     Status: None   Collection Time: 09/30/21 12:44 AM   Specimen: Nasopharyngeal Swab; Nasopharyngeal(NP) swabs in vial transport medium  Result Value Ref Range Status   SARS Coronavirus 2 by RT PCR NEGATIVE NEGATIVE Final    Comment: (NOTE) SARS-CoV-2 target nucleic acids are NOT DETECTED.  The SARS-CoV-2 RNA is generally detectable in upper respiratory specimens during the acute phase of infection. The lowest concentration of SARS-CoV-2 viral copies this assay can detect is 138 copies/mL. A negative result does not preclude SARS-Cov-2 infection and should not be used as the sole basis for treatment or other patient management decisions. A negative result may occur with  improper specimen collection/handling, submission of specimen other than nasopharyngeal swab, presence of viral mutation(s) within the areas targeted by this assay, and inadequate number of viral copies(<138 copies/mL). A negative result must be combined with clinical observations, patient history, and epidemiological information. The expected result is Negative.  Fact  Sheet for Patients:  EntrepreneurPulse.com.au  Fact Sheet for Healthcare Providers:  IncredibleEmployment.be  This test is no t yet approved or cleared by the [place] FDA and  has been authorized for detection and/or diagnosis of SARS-CoV-2 by FDA under an Emergency Use Authorization (EUA). This EUA will remain  in effect (meaning this test can be used) for the duration of the COVID-19 declaration under Section 564(b)(1) of the Act, 21 U.S.C.section 360bbb-3(b)(1), unless the authorization is terminated  or revoked sooner.       Influenza A by PCR NEGATIVE NEGATIVE Final   Influenza B by PCR NEGATIVE NEGATIVE Final    Comment: (NOTE) The Xpert Xpress SARS-CoV-2/FLU/RSV plus assay is intended as an aid in the diagnosis of influenza from Nasopharyngeal swab specimens and should not be used as a sole basis for treatment. Nasal washings and aspirates are unacceptable for Xpert Xpress SARS-CoV-2/FLU/RSV testing.  Fact Sheet for Patients: EntrepreneurPulse.com.au  Fact Sheet for Healthcare Providers: IncredibleEmployment.be  This test is not yet approved or cleared by the [place] FDA and has been authorized for detection and/or diagnosis of SARS-CoV-2 by FDA under an Emergency Use Authorization (EUA). This EUA will remain in effect (meaning this test can be used) for the duration of the COVID-19 declaration under Section 564(b)(1) of the Act, 21 U.S.C. section 360bbb-3(b)(1), unless the authorization is terminated or revoked.  Performed at [facility], [place] [address], [place], [place] [address]      Time coordinating discharge:  I have spent 35 minutes face to face with the patient and on the ward discussing the patients care, assessment, plan and disposition with other care givers. >50% of the time was devoted counseling the patient about the risks and benefits of treatment/Discharge  disposition and coordinating care.   SIGNED:   Damita Lack, MD  [facility] 10/01/2021, 4:39 PM   If 7PM-7AM, please contact night-coverage

## 2021-10-01 NOTE — Consult Note (Signed)
[place]  Reason for Consultation: CKD 5, hyponatremia Requesting Provider: Dr. Reesa Chew  HPI: George [name] is an 35 y.o. male with CKD 5, HTN, h/o CVAs (chronic ischemic infarcts R cerebellum, L occ lobe), GERD who is seen for evaluation and management of CKD 5 and hyponatremia in the setting of N/V.   Pt presented 10/3 in setting of 3 days of profuse N/V, dizziness.  Found to have Na 112, BUN 35, CR 5.2.  He's been treated with isotonic fluids and sodium has trended up to 125 as of this AM.  BUN 36 and Cr 5.3 this AM.  Hb 8.8 from 12.6 on presentation; no reports of bleeding.  He's been mildly hypertensive.   Currently feeling well.  No uremic symptoms. Tolerating po intake. Has f/u with primary nephrologist Dr Carolin Sicks in about 4 weeks.    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) 09/19/2019   Hypertension    Neurologic deficit due to acute ischemic cerebrovascular accident (CVA) ([place]) 06/13/2019   Supraventricular tachycardia ([place])    Type 1 diabetes ([facility])    PSH: Past Surgical History:  Procedure Laterality Date   AV FISTULA PLACEMENT Left 03/15/2021   Procedure: LEFT UPPER EXTREMITY ARTERIOVENOUS (AV) FISTULA CREATION;  Surgeon: Rosetta Posner, MD;  Location: [facility];  Service: Vascular;  Laterality: Left;   BUBBLE STUDY  06/20/2019   Procedure: BUBBLE STUDY;  Surgeon: Fay Records, MD;  Location: [facility];  Service: Cardiovascular;;   TEE WITHOUT CARDIOVERSION N/A 06/20/2019   Procedure: TRANSESOPHAGEAL ECHOCARDIOGRAM (TEE);  Surgeon: Fay Records, MD;  Location: [facility] ENDOSCOPY;  Service: Cardiovascular;  Laterality: N/A;    Past Medical History:  Diagnosis Date   AKI (acute kidney injury) ([place])    Cerebral infarction ([place])    Combined receptive and expressive aphasia as late  effect of cerebrovascular accident (CVA) 09/19/2019   Hypertension    Neurologic deficit due to acute ischemic cerebrovascular accident (CVA) ([place]) 06/13/2019   Supraventricular tachycardia ([place])    Type 1 diabetes ([place])     Medications:  I have reviewed the patient's current medications.  (Not in a hospital admission)   ALLERGIES:  No Known Allergies  FAM HX: Family History  Problem Relation Age of Onset   Hypertension Mother    Diabetes Mother    Hyperlipidemia Mother    Diabetes Father     Social History:   reports that he quit smoking about 15 months ago. His smoking use included cigarettes. He started smoking about 10 years ago. He smoked an average of .25 packs per day. He has never used smokeless tobacco. He reports that he does not currently use drugs after having used the following drugs: Marijuana. No history on file for alcohol use.  ROS: 12 system ROS neg except per HPI above  Blood pressure (!) 163/90, pulse 100, temperature 98.5 F (36.9 C), temperature source Oral, resp. rate (!) 22, height '5\' 3"'$  (1.6 m), weight 56.7 kg, SpO2 100 %. PHYSICAL EXAM: Gen: comfortable on stretcher  Eyes:  anicteric, glasses ENT: MMM Neck: supple CV:  RRR, no rub Abd: soft, nontender, NABS Lungs: clear GU: no foley Extr:  LUE RB forearm AVF +t/b, straight and superficial ready for use Neuro: no asterixis Skin: warm and dry, no rashes, mult tattoos noted   Results  for orders placed or performed during the hospital encounter of 09/29/21 (from the past 48 hour(s))  CBG monitoring, ED     Status: Abnormal   Collection Time: 09/29/21 11:17 PM  Result Value Ref Range   Glucose-Capillary 174 (H) 70 - 99 mg/dL    Comment: Glucose reference range applies only to samples taken after fasting for at least 8 hours.  Ethanol     Status: None   Collection Time: 09/29/21 11:31 PM  Result Value Ref Range   Alcohol, Ethyl (B) <10 <10 mg/dL    Comment: (NOTE) Lowest detectable limit for  serum alcohol is 10 mg/dL.  For medical purposes only. Performed at [facility], [place] [address], [place], [place] [address]   Protime-INR     Status: None   Collection Time: 09/29/21 11:31 PM  Result Value Ref Range   Prothrombin Time 12.1 11.4 - 15.2 seconds   INR 0.9 0.8 - 1.2    Comment: (NOTE) INR goal varies based on device and disease states. Performed at [facility], [place] [address], [place], [place] [address]   APTT     Status: None   Collection Time: 09/29/21 11:31 PM  Result Value Ref Range   aPTT 26 24 - 36 seconds    Comment: Performed at [facility] [address] [place], [place] [address]  CBC     Status: Abnormal   Collection Time: 09/29/21 11:31 PM  Result Value Ref Range   WBC 14.6 (H) 4.0 - 10.5 K/uL   RBC 4.02 (L) 4.22 - 5.81 MIL/uL   Hemoglobin 12.2 (L) 13.0 - 17.0 g/dL   HCT 33.0 (L) 39.0 - 52.0 %   MCV 82.1 80.0 - 100.0 fL   MCH 30.3 26.0 - 34.0 pg   MCHC 37.0 (H) 30.0 - 36.0 g/dL    Comment: REPEATED TO VERIFY CORRECTED FOR COLD AGGLUTININS    RDW 12.5 11.5 - 15.5 %   Platelets 226 150 - 400 K/uL   nRBC 0.0 0.0 - 0.2 %    Comment: Performed at [facility], [place] [address] [place], [place] [address]  Differential     Status: Abnormal   Collection Time: 09/29/21 11:31 PM  Result Value Ref Range   Neutrophils Relative % 90 %   Neutro Abs 13.2 (H) 1.7 - 7.7 K/uL   Lymphocytes Relative 3 %   Lymphs Abs 0.5 (L) 0.7 - 4.0 K/uL   Monocytes Relative 5 %   Monocytes Absolute 0.8 0.1 - 1.0 K/uL   Eosinophils Relative 1 %   Eosinophils Absolute 0.1 0.0 - 0.5 K/uL   Basophils Relative 0 %   Basophils Absolute 0.0 0.0 - 0.1 K/uL   Immature Granulocytes 1 %   Abs Immature Granulocytes 0.08 (H) 0.00 - 0.07 K/uL    Comment: Performed at [facility] [address], [place], [place] [address]  Comprehensive metabolic panel     Status: Abnormal   Collection Time: 09/29/21 11:31 PM  Result Value Ref Range   Sodium 113  (LL) 135 - 145 mmol/L    Comment: CRITICAL RESULT CALLED TO, READ BACK BY AND VERIFIED WITH: Tilden Dome RN 09/30/21 0029 M KOROLESKI    Potassium 3.9 3.5 - 5.1 mmol/L   Chloride 76 (L) 98 - 111 mmol/L   CO2 21 (L) 22 - 32 mmol/L   Glucose, Bld 175 (H) 70 - 99 mg/dL    Comment: Glucose reference range  applies only to samples taken after fasting for at least 8 hours.   BUN 34 (H) 6 - 20 mg/dL   Creatinine, Ser 5.35 (H) 0.61 - 1.24 mg/dL   Calcium 9.0 8.9 - 10.3 mg/dL   Total Protein 6.4 (L) 6.5 - 8.1 g/dL   Albumin 3.9 3.5 - 5.0 g/dL   AST 36 15 - 41 U/L   ALT 43 0 - 44 U/L   Alkaline Phosphatase 90 38 - 126 U/L   Total Bilirubin 0.9 0.3 - 1.2 mg/dL   GFR, Estimated 13 (L) >60 mL/min    Comment: (NOTE) Calculated using the CKD-EPI Creatinine Equation (2021)    Anion gap 16 (H) 5 - 15    Comment: Performed at [facility], [place] [address] [place], [place] [address]  I-stat chem 8, ED     Status: Abnormal   Collection Time: 09/29/21 11:51 PM  Result Value Ref Range   Sodium 112 (LL) 135 - 145 mmol/L   Potassium 4.0 3.5 - 5.1 mmol/L   Chloride 77 (L) 98 - 111 mmol/L   BUN 34 (H) 6 - 20 mg/dL   Creatinine, Ser 6.00 (H) 0.61 - 1.24 mg/dL   Glucose, Bld 173 (H) 70 - 99 mg/dL    Comment: Glucose reference range applies only to samples taken after fasting for at least 8 hours.   Calcium, Ion 1.01 (L) 1.15 - 1.40 mmol/L   TCO2 23 22 - 32 mmol/L   Hemoglobin 12.6 (L) 13.0 - 17.0 g/dL   HCT 37.0 (L) 39.0 - 52.0 %   Comment NOTIFIED PHYSICIAN   Resp Panel by RT-PCR (Flu A&B, Covid) Nasopharyngeal Swab     Status: None   Collection Time: 09/30/21 12:44 AM   Specimen: Nasopharyngeal Swab; Nasopharyngeal(NP) swabs in vial transport medium  Result Value Ref Range   SARS Coronavirus 2 by RT PCR NEGATIVE NEGATIVE    Comment: (NOTE) SARS-CoV-2 target nucleic acids are NOT DETECTED.  The SARS-CoV-2 RNA is generally detectable in upper respiratory specimens during the acute phase  of infection. The lowest concentration of SARS-CoV-2 viral copies this assay can detect is 138 copies/mL. A negative result does not preclude SARS-Cov-2 infection and should not be used as the sole basis for treatment or other patient management decisions. A negative result may occur with  improper specimen collection/handling, submission of specimen other than nasopharyngeal swab, presence of viral mutation(s) within the areas targeted by this assay, and inadequate number of viral copies(<138 copies/mL). A negative result must be combined with clinical observations, patient history, and epidemiological information. The expected result is Negative.  Fact Sheet for Patients:  EntrepreneurPulse.com.au  Fact Sheet for Healthcare Providers:  IncredibleEmployment.be  This test is no t yet approved or cleared by the [place] FDA and  has been authorized for detection and/or diagnosis of SARS-CoV-2 by FDA under an Emergency Use Authorization (EUA). This EUA will remain  in effect (meaning this test can be used) for the duration of the COVID-19 declaration under Section 564(b)(1) of the Act, 21 U.S.C.section 360bbb-3(b)(1), unless the authorization is terminated  or revoked sooner.       Influenza A by PCR NEGATIVE NEGATIVE   Influenza B by PCR NEGATIVE NEGATIVE    Comment: (NOTE) The Xpert Xpress SARS-CoV-2/FLU/RSV plus assay is intended as an aid in the diagnosis of influenza from Nasopharyngeal swab specimens and should not be used as a sole basis for treatment. Nasal washings and aspirates are unacceptable for Xpert Xpress  SARS-CoV-2/FLU/RSV testing.  Fact Sheet for Patients: EntrepreneurPulse.com.au  Fact Sheet for Healthcare Providers: IncredibleEmployment.be  This test is not yet approved or cleared by the [place] FDA and has been authorized for detection and/or diagnosis of SARS-CoV-2 by FDA  under an Emergency Use Authorization (EUA). This EUA will remain in effect (meaning this test can be used) for the duration of the COVID-19 declaration under Section 564(b)(1) of the Act, 21 U.S.C. section 360bbb-3(b)(1), unless the authorization is terminated or revoked.  Performed at [facility], [place] [address], [place], [place] [address]   CBG monitoring, ED     Status: Abnormal   Collection Time: 09/30/21  3:27 AM  Result Value Ref Range   Glucose-Capillary 240 (H) 70 - 99 mg/dL    Comment: Glucose reference range applies only to samples taken after fasting for at least 8 hours.  HIV Antibody (routine testing w rflx)     Status: None   Collection Time: 09/30/21  3:56 AM  Result Value Ref Range   HIV Screen 4th Generation wRfx Non Reactive Non Reactive    Comment: Performed at [facility], [address], [place], [place] [address]  Magnesium     Status: None   Collection Time: 09/30/21  3:56 AM  Result Value Ref Range   Magnesium 1.9 1.7 - 2.4 mg/dL    Comment: Performed at [facility] [address] [place], [place] [address]  Phosphorus     Status: None   Collection Time: 09/30/21  3:56 AM  Result Value Ref Range   Phosphorus 3.8 2.5 - 4.6 mg/dL    Comment: Performed at [facility] [address], [place], [place] [address]  Comprehensive metabolic panel     Status: Abnormal   Collection Time: 09/30/21  3:56 AM  Result Value Ref Range   Sodium 114 (LL) 135 - 145 mmol/L    Comment: CRITICAL RESULT CALLED TO, READ BACK BY AND VERIFIED WITH: Marylen Ponto RN 09/30/21 0509 M KOROLESKI    Potassium 3.7 3.5 - 5.1 mmol/L   Chloride 80 (L) 98 - 111 mmol/L   CO2 20 (L) 22 - 32 mmol/L   Glucose, Bld 232 (H) 70 - 99 mg/dL    Comment: Glucose reference range applies only to samples taken after fasting for at least 8 hours.   BUN 35 (H) 6 - 20 mg/dL   Creatinine, Ser 5.18 (H) 0.61 - 1.24 mg/dL   Calcium 8.0 (L) 8.9 - 10.3 mg/dL   Total Protein  5.6 (L) 6.5 - 8.1 g/dL   Albumin 3.3 (L) 3.5 - 5.0 g/dL   AST 28 15 - 41 U/L   ALT 36 0 - 44 U/L   Alkaline Phosphatase 78 38 - 126 U/L   Total Bilirubin 0.8 0.3 - 1.2 mg/dL   GFR, Estimated 14 (L) >60 mL/min    Comment: (NOTE) Calculated using the CKD-EPI Creatinine Equation (2021)    Anion gap 14 5 - 15    Comment: Performed at [facility], [place] [address], [place], [place] [address]  CBC     Status: Abnormal   Collection Time: 09/30/21  3:56 AM  Result Value Ref Range   WBC 11.1 (H) 4.0 - 10.5 K/uL   RBC 3.41 (L) 4.22 - 5.81 MIL/uL   Hemoglobin 10.3 (L) 13.0 - 17.0 g/dL   HCT 28.3 (L) 39.0 - 52.0 %   MCV 83.0 80.0 - 100.0 fL  MCH 30.2 26.0 - 34.0 pg   MCHC 36.4 (H) 30.0 - 36.0 g/dL   RDW 12.5 11.5 - 15.5 %   Platelets 174 150 - 400 K/uL   nRBC 0.0 0.0 - 0.2 %    Comment: Performed at [facility], [place] [address] [place], [place] [address]  Osmolality     Status: Abnormal   Collection Time: 09/30/21  3:56 AM  Result Value Ref Range   Osmolality 256 (L) 275 - 295 mOsm/kg    Comment: Performed at [facility], [place] [address], [place], [place] [address]  TSH     Status: None   Collection Time: 09/30/21  3:56 AM  Result Value Ref Range   TSH 1.826 0.350 - 4.500 uIU/mL    Comment: Performed by a 3rd Generation assay with a functional sensitivity of <=0.01 uIU/mL. Performed at [facility], [place] [address], [place], [place] [address]   Uric acid     Status: None   Collection Time: 09/30/21  3:56 AM  Result Value Ref Range   Uric Acid, Serum 5.5 3.7 - 8.6 mg/dL    Comment: Performed at [facility] [address] [place], [place] [address]  Urine rapid drug screen (hosp performed)     Status: Abnormal   Collection Time: 09/30/21  4:05 AM  Result Value Ref Range   Opiates NONE DETECTED NONE DETECTED   Cocaine NONE DETECTED NONE DETECTED   Benzodiazepines NONE DETECTED NONE DETECTED   Amphetamines NONE DETECTED NONE DETECTED    Tetrahydrocannabinol POSITIVE (A) NONE DETECTED   Barbiturates NONE DETECTED NONE DETECTED    Comment: (NOTE) DRUG SCREEN FOR MEDICAL PURPOSES ONLY.  IF CONFIRMATION IS NEEDED FOR ANY PURPOSE, NOTIFY LAB WITHIN 5 DAYS.  LOWEST DETECTABLE LIMITS FOR URINE DRUG SCREEN Drug Class                     Cutoff (ng/mL) Amphetamine and metabolites    1000 Barbiturate and metabolites    200 Benzodiazepine                 [id number] Tricyclics and metabolites     300 Opiates and metabolites        300 Cocaine and metabolites        300 THC                            50 Performed at [facility], [place] [address] [place], [place] [address]   Urinalysis, Complete w Microscopic     Status: Abnormal   Collection Time: 09/30/21  4:05 AM  Result Value Ref Range   Color, Urine STRAW (A) YELLOW   APPearance CLEAR CLEAR   Specific Gravity, Urine 1.004 (L) 1.005 - 1.030   pH 6.0 5.0 - 8.0   Glucose, UA 50 (A) NEGATIVE mg/dL   Hgb urine dipstick SMALL (A) NEGATIVE   Bilirubin Urine NEGATIVE NEGATIVE   Ketones, ur NEGATIVE NEGATIVE mg/dL   Protein, ur 100 (A) NEGATIVE mg/dL   Nitrite NEGATIVE NEGATIVE   Leukocytes,Ua NEGATIVE NEGATIVE   RBC / HPF 0-5 0 - 5 RBC/hpf   WBC, UA 0-5 0 - 5 WBC/hpf   Bacteria, UA RARE (A) NONE SEEN    Comment: Performed at [facility] [address] [place],  [address]  Sodium, urine, random     Status: None   Collection Time:  09/30/21  4:05 AM  Result Value Ref Range   Sodium, Ur <10 mmol/L    Comment: Performed at [facility] [address] [place], [place] [address]  Osmolality, urine     Status: Abnormal   Collection Time: 09/30/21  4:05 AM  Result Value Ref Range   Osmolality, Ur 109 (L) 300 - 900 mOsm/kg    Comment: Performed at [facility], [place] [address] [place], [place] [address]  Creatinine, urine, random     Status: None   Collection Time: 09/30/21  4:05 AM  Result Value Ref Range   Creatinine, Urine 35.58 mg/dL     Comment: Performed at [facility] [address] [place], [place] [address]  CBG monitoring, ED     Status: Abnormal   Collection Time: 09/30/21  8:25 AM  Result Value Ref Range   Glucose-Capillary 175 (H) 70 - 99 mg/dL    Comment: Glucose reference range applies only to samples taken after fasting for at least 8 hours.  CBG monitoring, ED     Status: Abnormal   Collection Time: 09/30/21 12:51 PM  Result Value Ref Range   Glucose-Capillary 155 (H) 70 - 99 mg/dL    Comment: Glucose reference range applies only to samples taken after fasting for at least 8 hours.  Basic metabolic panel     Status: Abnormal   Collection Time: 09/30/21  1:40 PM  Result Value Ref Range   Sodium 119 (LL) 135 - 145 mmol/L    Comment: CRITICAL RESULT CALLED TO, READ BACK BY AND VERIFIED WITH: P.PULLIAM,RN  1438 09/30/21 CLARK,S    Potassium 3.3 (L) 3.5 - 5.1 mmol/L   Chloride 87 (L) 98 - 111 mmol/L   CO2 21 (L) 22 - 32 mmol/L   Glucose, Bld 146 (H) 70 - 99 mg/dL    Comment: Glucose reference range applies only to samples taken after fasting for at least 8 hours.   BUN 34 (H) 6 - 20 mg/dL   Creatinine, Ser 5.30 (H) 0.61 - 1.24 mg/dL   Calcium 8.2 (L) 8.9 - 10.3 mg/dL   GFR, Estimated 14 (L) >60 mL/min    Comment: (NOTE) Calculated using the CKD-EPI Creatinine Equation (2021)    Anion gap 11 5 - 15    Comment: Performed at [facility] [address], [place], [place] [id number]  Basic metabolic panel     Status: Abnormal   Collection Time: 09/30/21  4:21 PM  Result Value Ref Range   Sodium 121 (L) 135 - 145 mmol/L   Potassium 3.3 (L) 3.5 - 5.1 mmol/L   Chloride 89 (L) 98 - 111 mmol/L   CO2 22 22 - 32 mmol/L   Glucose, Bld 162 (H) 70 - 99 mg/dL    Comment: Glucose reference range applies only to samples taken after fasting for at least 8 hours.   BUN 36 (H) 6 - 20 mg/dL   Creatinine, Ser 5.50 (H) 0.61 - 1.24 mg/dL   Calcium 8.1 (L) 8.9 - 10.3 mg/dL   GFR, Estimated 13 (L) >60 mL/min     Comment: (NOTE) Calculated using the CKD-EPI Creatinine Equation (2021)    Anion gap 10 5 - 15    Comment: Performed at [facility] [address] [place], [place] [address]  CBG monitoring, ED     Status: Abnormal   Collection Time: 09/30/21  4:54 PM  Result Value Ref Range   Glucose-Capillary 159 (  H) 70 - 99 mg/dL    Comment: Glucose reference range applies only to samples taken after fasting for at least 8 hours.  CBG monitoring, ED     Status: Abnormal   Collection Time: 09/30/21  9:57 PM  Result Value Ref Range   Glucose-Capillary 140 (H) 70 - 99 mg/dL    Comment: Glucose reference range applies only to samples taken after fasting for at least 8 hours.  Basic metabolic panel     Status: Abnormal   Collection Time: 09/30/21 10:50 PM  Result Value Ref Range   Sodium 122 (L) 135 - 145 mmol/L   Potassium 3.7 3.5 - 5.1 mmol/L   Chloride 92 (L) 98 - 111 mmol/L   CO2 22 22 - 32 mmol/L   Glucose, Bld 141 (H) 70 - 99 mg/dL    Comment: Glucose reference range applies only to samples taken after fasting for at least 8 hours.   BUN 37 (H) 6 - 20 mg/dL   Creatinine, Ser 5.52 (H) 0.61 - 1.24 mg/dL   Calcium 8.2 (L) 8.9 - 10.3 mg/dL   GFR, Estimated 13 (L) >60 mL/min    Comment: (NOTE) Calculated using the CKD-EPI Creatinine Equation (2021)    Anion gap 8 5 - 15    Comment: Performed at [facility] [address], [place], [place] [address]  CBC     Status: Abnormal   Collection Time: 10/01/21  3:47 AM  Result Value Ref Range   WBC 6.2 4.0 - 10.5 K/uL   RBC 2.84 (L) 4.22 - 5.81 MIL/uL   Hemoglobin 8.8 (L) 13.0 - 17.0 g/dL   HCT 24.4 (L) 39.0 - 52.0 %   MCV 85.9 80.0 - 100.0 fL   MCH 31.0 26.0 - 34.0 pg   MCHC 36.1 (H) 30.0 - 36.0 g/dL   RDW 12.9 11.5 - 15.5 %   Platelets 145 (L) 150 - 400 K/uL   nRBC 0.0 0.0 - 0.2 %    Comment: Performed at [facility], [place] [address], [place], [place] [address]  Magnesium     Status: None   Collection Time: 10/01/21   3:47 AM  Result Value Ref Range   Magnesium 2.1 1.7 - 2.4 mg/dL    Comment: Performed at [facility] [address] [place], [place] [id number]  Basic metabolic panel     Status: Abnormal   Collection Time: 10/01/21  3:47 AM  Result Value Ref Range   Sodium 125 (L) 135 - 145 mmol/L   Potassium 3.3 (L) 3.5 - 5.1 mmol/L   Chloride 93 (L) 98 - 111 mmol/L   CO2 22 22 - 32 mmol/L   Glucose, Bld 137 (H) 70 - 99 mg/dL    Comment: Glucose reference range applies only to samples taken after fasting for at least 8 hours.   BUN 36 (H) 6 - 20 mg/dL   Creatinine, Ser 5.33 (H) 0.61 - 1.24 mg/dL   Calcium 8.1 (L) 8.9 - 10.3 mg/dL   GFR, Estimated 14 (L) >60 mL/min    Comment: (NOTE) Calculated using the CKD-EPI Creatinine Equation (2021)    Anion gap 10 5 - 15    Comment: Performed at [facility] [address], [place], [place] [address]  CBG monitoring, ED     Status: Abnormal   Collection Time: 10/01/21  7:08 AM  Result Value Ref Range   Glucose-Capillary 142 (H) 70 - 99 mg/dL  Comment: Glucose reference range applies only to samples taken after fasting for at least 8 hours.    CT HEAD WO CONTRAST  Result Date: 09/30/2021 CLINICAL DATA:  Dizziness EXAM: CT HEAD WITHOUT CONTRAST TECHNIQUE: Contiguous axial images were obtained from the base of the skull through the vertex without intravenous contrast. COMPARISON:  01/11/2020 FINDINGS: Brain: Right cerebellar and left occipital lobe infarcts are noted, stable from the prior exam. No acute hemorrhage, acute infarction or space-occupying mass lesion is seen. Vascular: No hyperdense vessel or unexpected calcification. Skull: Normal. Negative for fracture or focal lesion. Sinuses/Orbits: No acute finding. Other: None. IMPRESSION: No acute intracranial abnormality noted. Chronic infarcts as described. Electronically Signed   By: Inez Catalina M.D.   On: 09/30/2021 00:15   MR BRAIN WO CONTRAST  Result Date: 09/30/2021 CLINICAL DATA:   Nonspecific dizziness.  Nausea and vomiting. EXAM: MRI HEAD WITHOUT CONTRAST TECHNIQUE: Multiplanar, multiecho pulse sequences of the brain and surrounding structures were obtained without intravenous contrast. COMPARISON:  Head CT same day.  MRI 01/12/2020. FINDINGS: Brain: Diffusion imaging does not show any acute or subacute infarction. Mild chronic small-vessel ischemic changes affect the pons. Numerous old small vessel cerebellar infarctions as seen previously. Old left PCA territory infarction affecting the posteromedial temporal lobe and occipital lobe with atrophy, encephalomalacia and gliosis. Old lacunar infarction of both thalami. Old lacunar infarction of the right caudate. Mild small vessel ischemic changes elsewhere affecting the cerebral hemispheric white matter. No mass lesion, recent hemorrhage, hydrocephalus or extra-axial collection. Some chronic hemosiderin deposition associated with the old left PCA territory infarction. Vascular: Major vessels at the base of the brain show flow. Skull and upper cervical spine: Negative Sinuses/Orbits: Clear/normal Other: None IMPRESSION: No acute finding. Numerous old small vessel cerebellar infarctions. Mild chronic small-vessel ischemic change of the pons. Old left PCA territory infarction affecting the posteromedial temporal lobe and occipital lobe. Old lacunar infarctions of the thalami and right caudate. Electronically Signed   By: Nelson Chimes M.D.   On: 09/30/2021 08:11   DG Chest Portable 1 View  Result Date: 09/30/2021 CLINICAL DATA:  Dizziness EXAM: PORTABLE CHEST 1 VIEW COMPARISON:  01/12/2020 FINDINGS: Check shadow is mildly prominent but accentuated by the portable technique. Lungs are clear bilaterally. No focal infiltrate or effusion is noted. No bony abnormality is seen. IMPRESSION: No acute abnormality noted. Electronically Signed   By: Inez Catalina M.D.   On: 09/30/2021 00:30    Assessment/Plan **hyponatremia, hypovolemic:  has been  improving consistently with isotonic volume expansion.  Expect will continue to improve.  No further w/u at this time.  He's been placed back on IVF which I think could be stopped since his oral intake is normal.   **CKD 5:  follows with Dr. Carolin Sicks.  Has mature AVF in L forearm.  No uremic symptoms, no need for dialysis at this point.  Discussed uremic symptoms to watch out for prior to next visit.   **Anemia:  Hb down from 12s to 8s but I suspect this is mostly dilutional, no bleeding reported.  Check iron stores and consider ESA if iron replete; can be done outpt as well  **HTN:  BP generally controlled, has been a bit high here on lying BPs.  Cont current meds.   **Hypokalemia:  has ben repleted.   Dispo - I think he could d/c anytime with outpt f/u.  Clinically he looks well and is taking normal oral intake now.   Justin Mend 10/01/2021, 9:16 AM

## 2021-10-02 ENCOUNTER — Telehealth: Payer: Self-pay

## 2021-10-02 NOTE — Telephone Encounter (Signed)
Transition Care Management Follow-up Telephone Call Date of discharge and from where: 10/01/2021-[facility]  How have you been since you were released from the hospital? Patient stated he is doing fine. Any questions or concerns? No  Items Reviewed: Did the pt receive and understand the discharge instructions provided? Yes  Medications obtained and verified?  No medication given at discharge  Other? No  Any new allergies since your discharge? No  Dietary orders reviewed? No Do you have support at home? Yes   Home Care and Equipment/Supplies: Were home health services ordered? not applicable If so, what is the name of the agency? N/A  Has the agency set up a time to come to the patient's home? not applicable Were any new equipment or medical supplies ordered?  No What is the name of the medical supply agency? N/A Were you able to get the supplies/equipment? not applicable Do you have any questions related to the use of the equipment or supplies? No  Functional Questionnaire: (I = Independent and D = Dependent) ADLs: I  Bathing/Dressing- I  Meal Prep- I  Eating- I  Maintaining continence- I  Transferring/Ambulation- I  Managing Meds- I  Follow up appointments reviewed:  PCP Hospital f/u appt confirmed? Yes  Scheduled to see Dr. Wynetta Emery on 10/10/2021 @ 9:30 am. [facility] f/u appt confirmed? No   Are transportation arrangements needed? No  If their condition worsens, is the pt aware to call PCP or go to the Emergency Dept.? Yes Was the patient provided with contact information for the PCP's office or ED? Yes Was to pt encouraged to call back with questions or concerns? Yes

## 2021-10-04 ENCOUNTER — Other Ambulatory Visit: Payer: Self-pay

## 2021-10-04 ENCOUNTER — Other Ambulatory Visit
Admission: RE | Admit: 2021-10-04 | Discharge: 2021-10-04 | Disposition: A | Discharge status: Home / Self Care | Payer: Medicaid Other | Attending: Internal Medicine | Admitting: Internal Medicine

## 2021-10-04 DIAGNOSIS — E871 Hypo-osmolality and hyponatremia: Secondary | ICD-10-CM | POA: Diagnosis not present

## 2021-10-04 LAB — BASIC METABOLIC PANEL
Anion gap: 7 (ref 5–15)
BUN: 42 mg/dL — ABNORMAL HIGH (ref 6–20)
CO2: 23 mmol/L (ref 22–32)
Calcium: 8.4 mg/dL — ABNORMAL LOW (ref 8.9–10.3)
Chloride: 100 mmol/L (ref 98–111)
Creatinine, Ser: 5.29 mg/dL — ABNORMAL HIGH (ref 0.61–1.24)
GFR, Estimated: 14 mL/min — ABNORMAL LOW (ref >60)
Glucose, Bld: 90 mg/dL (ref 70–99)
Potassium: 3.6 mmol/L (ref 3.5–5.1)
Sodium: 130 mmol/L — ABNORMAL LOW (ref 135–145)

## 2021-10-10 ENCOUNTER — Other Ambulatory Visit: Payer: Self-pay

## 2021-10-10 ENCOUNTER — Ambulatory Visit: Payer: Medicaid Other | Attending: Internal Medicine | Admitting: Internal Medicine

## 2021-10-10 ENCOUNTER — Encounter: Payer: Self-pay | Admitting: Internal Medicine

## 2021-10-10 VITALS — BP 165/84 | HR 99 | Resp 16 | Wt 141.8 lb

## 2021-10-10 DIAGNOSIS — E1022 Type 1 diabetes mellitus with diabetic chronic kidney disease: Secondary | ICD-10-CM | POA: Diagnosis not present

## 2021-10-10 DIAGNOSIS — Z8249 Family history of ischemic heart disease and other diseases of the circulatory system: Secondary | ICD-10-CM | POA: Diagnosis not present

## 2021-10-10 DIAGNOSIS — R0609 Other forms of dyspnea: Secondary | ICD-10-CM

## 2021-10-10 DIAGNOSIS — N186 End stage renal disease: Secondary | ICD-10-CM

## 2021-10-10 DIAGNOSIS — Z8349 Family history of other endocrine, nutritional and metabolic diseases: Secondary | ICD-10-CM | POA: Diagnosis not present

## 2021-10-10 DIAGNOSIS — D631 Anemia in chronic kidney disease: Secondary | ICD-10-CM | POA: Diagnosis not present

## 2021-10-10 DIAGNOSIS — R21 Rash and other nonspecific skin eruption: Secondary | ICD-10-CM

## 2021-10-10 DIAGNOSIS — Z794 Long term (current) use of insulin: Secondary | ICD-10-CM | POA: Diagnosis not present

## 2021-10-10 DIAGNOSIS — E1065 Type 1 diabetes mellitus with hyperglycemia: Secondary | ICD-10-CM | POA: Insufficient documentation

## 2021-10-10 DIAGNOSIS — N049 Nephrotic syndrome with unspecified morphologic changes: Secondary | ICD-10-CM | POA: Diagnosis not present

## 2021-10-10 DIAGNOSIS — I129 Hypertensive chronic kidney disease with stage 1 through stage 4 chronic kidney disease, or unspecified chronic kidney disease: Secondary | ICD-10-CM

## 2021-10-10 DIAGNOSIS — R0602 Shortness of breath: Secondary | ICD-10-CM | POA: Diagnosis not present

## 2021-10-10 DIAGNOSIS — Z833 Family history of diabetes mellitus: Secondary | ICD-10-CM | POA: Insufficient documentation

## 2021-10-10 DIAGNOSIS — E103599 Type 1 diabetes mellitus with proliferative diabetic retinopathy without macular edema, unspecified eye: Secondary | ICD-10-CM | POA: Diagnosis not present

## 2021-10-10 DIAGNOSIS — M7989 Other specified soft tissue disorders: Secondary | ICD-10-CM | POA: Insufficient documentation

## 2021-10-10 DIAGNOSIS — I120 Hypertensive chronic kidney disease with stage 5 chronic kidney disease or end stage renal disease: Secondary | ICD-10-CM | POA: Diagnosis not present

## 2021-10-10 DIAGNOSIS — Z87891 Personal history of nicotine dependence: Secondary | ICD-10-CM | POA: Insufficient documentation

## 2021-10-10 DIAGNOSIS — Z7902 Long term (current) use of antithrombotics/antiplatelets: Secondary | ICD-10-CM | POA: Diagnosis not present

## 2021-10-10 DIAGNOSIS — E785 Hyperlipidemia, unspecified: Secondary | ICD-10-CM | POA: Diagnosis not present

## 2021-10-10 DIAGNOSIS — Z8673 Personal history of transient ischemic attack (TIA), and cerebral infarction without residual deficits: Secondary | ICD-10-CM | POA: Insufficient documentation

## 2021-10-10 DIAGNOSIS — N185 Chronic kidney disease, stage 5: Secondary | ICD-10-CM

## 2021-10-10 DIAGNOSIS — Z79899 Other long term (current) drug therapy: Secondary | ICD-10-CM | POA: Diagnosis not present

## 2021-10-10 DIAGNOSIS — R601 Generalized edema: Secondary | ICD-10-CM | POA: Insufficient documentation

## 2021-10-10 MED ORDER — FUROSEMIDE 40 MG PO TABS
ORAL_TABLET | ORAL | 0 refills | Status: DC
Start: 2021-10-10 — End: 2021-10-23

## 2021-10-10 MED ORDER — TRIAMCINOLONE ACETONIDE 0.1 % EX CREA: 1.0000 "application " | TOPICAL_CREAM | Freq: Two times a day (BID) | CUTANEOUS | 0 refills | Status: DC

## 2021-10-10 NOTE — Progress Notes (Signed)
Pt states his pain is coming from his waist down

## 2021-10-10 NOTE — Patient Instructions (Addendum)
I have sent a prescription to your pharmacy for the fluid pill called furosemide.  Please take as prescribed.  If you find that the shortness of breath and swelling in the legs and not improving with the fluid pill, please have a low threshold to being seen in the emergency room as discussed today.  I have sent a prescription to your pharmacy for triamcinolone steroid cream to use on the rash.

## 2021-10-10 NOTE — Progress Notes (Signed)
Patient ID: George [name], male    DOB: Oct 13, 1986  MRN: [id number]  CC: Transition of care visit Date of hospitalization: 10/2-4/22 Date of call from [facility]: 10/02/2021  Subjective: George [name] is a 35 y.o. male who presents for transition of care visit.  PCP is Dr. Joya Gaskins. His concerns today include:  Patient with history of CKD 5, DM type I with retinopathy, HTN, CVA, HL, tobacco dependence,  Patient hospitalized recently with nausea and vomiting and severe hyponatremia.  Sodium level was 113.  Seen by nephrology.  Given IV fluids.  Sodium level improved and was 132 by the time of discharge.  Patient with anemia.  H/H on discharge 8.8/24 with iron studies consistent with anemia of chronic disease.  Today: Since hospital discharge, patient reports no further nausea or vomiting.  He had a BMP done last week and sodium was 130. -Since hospital discharge, he has noted some SOB with minimal activity like bending over to tie his shoe.  No PND orthopnea.  He has also noted swelling in his upper and lower extremities, around his waist and in the scrotum.  Because of the swelling, his legs are painful and feel stiff.  Not on any diuretic.  Gained 16 lbs since hospital discharge. -BP elevated today.  Checks blood pressure a few times a week.  Gives range of 140-150/80-90s.  Reports compliance with medications including carvedilol, hydralazine and Norvasc. He will see his nephrologist in 2 weeks. -Also report intense itching of the legs especially the lower legs without initiating factors. He reports compliance with taking iron supplement for his anemia.   Patient Active Problem List   Diagnosis Date Noted   Hyponatremia 09/30/2021   Nausea & vomiting 09/30/2021   Type 1 diabetes (HCC)    GERD (gastroesophageal reflux disease)    ESRD (end stage renal disease) ([place])    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 ([place]) 02/07/2020   Type 1 diabetes mellitus with hyperglycemia  ([place]) 02/07/2020   Chronic renal disease, stage V ([place]) 02/07/2020   Anemia    Proliferative diabetic retinopathy associated with type 1 diabetes mellitus ([place]) 12/19/2019   Former tobacco use 06/27/2019   Hyperlipidemia due to type 1 diabetes mellitus ([place]) 06/27/2019   HTN (hypertension)    History of CVA (cerebrovascular accident) 06/13/2019   Leukocytosis      Current Outpatient Medications on File Prior to Visit  Medication Sig Dispense Refill   acetaminophen (TYLENOL) 325 MG tablet Take 650 mg by mouth every 6 (six) hours as needed for moderate pain or headache.     amLODipine (NORVASC) 10 MG tablet Take 1 tablet (10 mg total) by mouth daily. 90 tablet 2   AURYXIA 1 GM 210 MG(Fe) tablet Take 1 tablet (210 mg total) by mouth 3 (three) times daily. 270 tablet    carvedilol (COREG) 25 MG tablet Take 1 tablet (25 mg total) by mouth 2 (two) times daily with a meal. 180 tablet 1   Cholecalciferol (VITAMIN D3) 50 MCG (2000 UT) TABS Take 200 Units by mouth daily.     clopidogrel (PLAVIX) 75 MG tablet Take 1 tablet (75 mg total) by mouth daily. 90 tablet 3   Continuous Blood Gluc Sensor (DEXCOM G6 SENSOR) MISC Use as instructed to check blood sugar 3 times daily. (Patient not taking: No sig reported) 3 each 2   Continuous Blood Gluc Transmit (DEXCOM G6 TRANSMITTER) MISC Inject 1 Device into the skin as directed. Use as instructed to  check blood sugar 3 times daily. (Patient not taking: No sig reported) 1 each 3   glucose blood (ONETOUCH VERIO) test strip Use as instructed to check blood sugar 3 times daily. (Patient taking differently: Use as instructed to check blood sugar 4-5 times daily) 100 each 11   hydrALAZINE (APRESOLINE) 100 MG tablet Take 1 tablet (100 mg total) by mouth 3 (three) times daily. 90 tablet 4   insulin aspart (NOVOLOG FLEXPEN) 100 UNIT/ML FlexPen Inject 5-30 Units into the skin See admin instructions. Mac daily 30 units to include correction scale and snack coverage three  times a day with meals 15 mL 6   insulin glargine (LANTUS SOLOSTAR) 100 UNIT/ML Solostar Pen Inject 16 Units into the skin daily. 15 mL 6   Insulin Pen Needle (PEN NEEDLES 3/16") 31G X 5 MM MISC To inject insulin with novolog flex pen, 3 times daily with meals. 100 each 4   pantoprazole (PROTONIX) 40 MG tablet Take 1 tablet (40 mg total) by mouth daily. For stomach protection 30 tablet 11   rosuvastatin (CRESTOR) 20 MG tablet TAKE 1 TABLET(20 MG) BY MOUTH DAILY (Patient taking differently: Take 20 mg by mouth daily.) 30 tablet 4   sodium bicarbonate 650 MG tablet Take 650 mg by mouth 2 (two) times daily.     No current facility-administered medications on file prior to visit.    No Known Allergies  Social History   Socioeconomic History   Marital status: Single    Spouse name: Not on file   Number of children: Not on file   Years of education: Not on file   Highest education level: Not on file  Occupational History   Not on file  Tobacco Use   Smoking status: Former    Packs/day: 0.25    Types: Cigarettes    Start date: 12/29/2010    Quit date: 06/25/2020    Years since quitting: 1.2   Smokeless tobacco: Never  Vaping Use   Vaping Use: Former   Start date: 12/30/2015   Quit date: 12/29/2017  Substance and Sexual Activity   Alcohol use: Not on file    Comment: 6 beers/week   Drug use: Not Currently    Types: Marijuana   Sexual activity: Not Currently  Other Topics Concern   Not on file  Social History Narrative   Not on file   Social Determinants of Health   Financial Resource Strain: Not on file  Food Insecurity: Not on file  Transportation Needs: Not on file  Physical Activity: Not on file  Stress: Not on file  Social Connections: Not on file  Intimate Partner Violence: Not on file    Family History  Problem Relation Age of Onset   Hypertension Mother    Diabetes Mother    Hyperlipidemia Mother    Diabetes Father     Past Surgical History:  Procedure  Laterality Date   AV FISTULA PLACEMENT Left 03/15/2021   Procedure: LEFT UPPER EXTREMITY ARTERIOVENOUS (AV) FISTULA CREATION;  Surgeon: Rosetta Posner, MD;  Location: [facility];  Service: Vascular;  Laterality: Left;   BUBBLE STUDY  06/20/2019   Procedure: BUBBLE STUDY;  Surgeon: Fay Records, MD;  Location: [facility];  Service: Cardiovascular;;   TEE WITHOUT CARDIOVERSION N/A 06/20/2019   Procedure: TRANSESOPHAGEAL ECHOCARDIOGRAM (TEE);  Surgeon: Fay Records, MD;  Location: [facility] ENDOSCOPY;  Service: Cardiovascular;  Laterality: N/A;    ROS: Review of Systems Negative except as stated above  PHYSICAL EXAM: BP Marland Kitchen)  165/84   Pulse 99   Resp 16   Wt 141 lb 12.8 oz (64.3 kg)   SpO2 97%   BMI 25.12 kg/m   Wt Readings from Last 3 Encounters:  10/10/21 141 lb 12.8 oz (64.3 kg)  09/29/21 125 lb (56.7 kg)  09/09/21 119 lb (54 kg)    Physical Exam  General appearance - alert, well appearing, young Caucasian male and in no distress Mental status - normal mood, behavior, speech, dress, motor activity, and thought processes Neck - supple, no significant adenopathy Chest -fine crackles heard more so on the left base than the right.  Of the lung fields clear. Heart -regular rate rhythm. GU Male -mild edema of the shaft of the penis and the scrotum Extremities -1+ edema of both legs including the thigh.  1+ edema noted of the posterior forearms.  Mild pitting edema over the sacrum.  He has a maturing graft in the left forearm Skin: Red excoriation marks noted over the lower one third of both shin CMP Latest Ref Rng & Units 10/04/2021 10/01/2021 10/01/2021  Glucose 70 - 99 mg/dL 90 58(L) 137(H)  BUN 6 - 20 mg/dL 42(H) 35(H) 36(H)  Creatinine 0.61 - 1.24 mg/dL 5.29(H) 5.54(H) 5.33(H)  Sodium 135 - 145 mmol/L 130(L) 132(L) 125(L)  Potassium 3.5 - 5.1 mmol/L 3.6 3.7 3.3(L)  Chloride 98 - 111 mmol/L 100 99 93(L)  CO2 22 - 32 mmol/L _0 Calcium 8.9 - 10.3 mg/dL 8.4(L) 8.6(L) 8.1(L)  Total  Protein 6.5 - 8.1 g/dL - - -  Total Bilirubin 0.3 - 1.2 mg/dL - - -  Alkaline Phos 38 - 126 U/L - - -  AST 15 - 41 U/L - - -  ALT 0 - 44 U/L - - -   Lipid Panel     Component Value Date/Time   CHOL 118 04/17/2020 1015   TRIG 81 04/17/2020 1015   HDL 47 04/17/2020 1015   CHOLHDL 2.5 04/17/2020 1015   CHOLHDL 5.1 01/12/2020 0546   VLDL 43 (H) 01/12/2020 0546   LDLCALC 55 04/17/2020 1015    CBC    Component Value Date/Time   WBC 6.2 10/01/2021 0347   RBC 2.84 (L) 10/01/2021 0347   HGB 8.8 (L) 10/01/2021 0347   HGB 12.7 (L) 04/17/2020 1015   HCT 24.4 (L) 10/01/2021 0347   HCT 37.3 (L) 04/17/2020 1015   PLT 145 (L) 10/01/2021 0347   PLT 222 04/17/2020 1015   MCV 85.9 10/01/2021 0347   MCV 89 04/17/2020 1015   MCH 31.0 10/01/2021 0347   MCHC 36.1 (H) 10/01/2021 0347   RDW 12.9 10/01/2021 0347   RDW 12.4 04/17/2020 1015   LYMPHSABS 0.5 (L) 09/29/2021 2331   LYMPHSABS 1.3 04/17/2020 1015   MONOABS 0.8 09/29/2021 2331   EOSABS 0.1 09/29/2021 2331   EOSABS 0.6 (H) 04/17/2020 1015   BASOSABS 0.0 09/29/2021 2331   BASOSABS 0.1 04/17/2020 1015    ASSESSMENT AND PLAN: 1. Anasarca associated with disorder of kidney Patient appears to be fluid overloaded associated with end-stage renal disease.  They may have overshot on the amount of fluids that he received during his recent hospitalization.  I recommend that he be seen in the emergency room for evaluation and to be given intravenous diuretics.  However patient was not very keen on wanting to return to the hospital at this time.  I agreed to try him with furosemide for several days.  However I told him to have  a very low threshold to be seen in the emergency room if the edema and shortness of breath persists despite taking the fluid pill.  He expressed understanding. - Comprehensive metabolic panel - Brain natriuretic peptide - ECHOCARDIOGRAM COMPLETE; Future - furosemide (LASIX) 40 MG tablet; 1.5 tab PO daily x 2 days  then 1 tab  daily x 5 days.  Dispense: 8 tablet; Refill: 0  2. Dyspnea on exertion See #1 above. - Brain natriuretic peptide - ECHOCARDIOGRAM COMPLETE; Future - furosemide (LASIX) 40 MG tablet; 1.5 tab PO daily x 2 days  then 1 tab daily x 5 days.  Dispense: 8 tablet; Refill: 0  3. Renal hypertension Blood pressure elevated today.  I did not add any additional medication as I am putting him on furosemide.  4. ESRD (end stage renal disease) ([place]) Keep follow-up appointment with nephrologist in 2 weeks  5. Anemia in stage 5 chronic kidney disease, not on chronic dialysis (HCC) Continue iron supplement - CBC  6. Rash in adult His itching may be due to ESRD - triamcinolone cream (KENALOG) 0.1 %; Apply 1 application topically 2 (two) times daily.  Dispense: 30 g; Refill: 0   Patient was given the opportunity to ask questions.  Patient verbalized understanding of the plan and was able to repeat key elements of the plan.   Orders Placed This Encounter  Procedures   Comprehensive metabolic panel   CBC   Brain natriuretic peptide   ECHOCARDIOGRAM COMPLETE      Requested Prescriptions   Signed Prescriptions Disp Refills   furosemide (LASIX) 40 MG tablet 8 tablet 0    Sig: 1.5 tab PO daily x 2 days  then 1 tab daily x 5 days.   triamcinolone cream (KENALOG) 0.1 % 30 g 0    Sig: Apply 1 application topically 2 (two) times daily.    Return in about 2 weeks (around 10/24/2021) for Give the 2 wk follow up with Dr. Joya Gaskins.  Karle Plumber, MD, FACP

## 2021-10-11 LAB — COMPREHENSIVE METABOLIC PANEL
ALT: 19 IU/L (ref 0–44)
AST: 20 IU/L (ref 0–40)
Albumin/Globulin Ratio: 2 (ref 1.2–2.2)
Albumin: 4.3 g/dL (ref 4.0–5.0)
Alkaline Phosphatase: 94 IU/L (ref 44–121)
BUN/Creatinine Ratio: 9 (ref 9–20)
BUN: 46 mg/dL — ABNORMAL HIGH (ref 6–20)
Bilirubin Total: 0.4 mg/dL (ref 0.0–1.2)
CO2: 19 mmol/L — ABNORMAL LOW (ref 20–29)
Calcium: 9.3 mg/dL (ref 8.7–10.2)
Chloride: 102 mmol/L (ref 96–106)
Creatinine, Ser: 5.21 mg/dL — ABNORMAL HIGH (ref 0.76–1.27)
Globulin, Total: 2.1 g/dL (ref 1.5–4.5)
Glucose: 91 mg/dL (ref 70–99)
Potassium: 4.4 mmol/L (ref 3.5–5.2)
Sodium: 138 mmol/L (ref 134–144)
Total Protein: 6.4 g/dL (ref 6.0–8.5)
eGFR: 14 mL/min/{1.73_m2} — ABNORMAL LOW (ref >59)

## 2021-10-11 LAB — CBC
Hematocrit: 31.2 % — ABNORMAL LOW (ref 37.5–51.0)
Hemoglobin: 10.8 g/dL — ABNORMAL LOW (ref 13.0–17.7)
MCH: 30.8 pg (ref 26.6–33.0)
MCHC: 34.6 g/dL (ref 31.5–35.7)
MCV: 89 fL (ref 79–97)
Platelets: 252 10*3/uL (ref 150–450)
RBC: 3.51 x10E6/uL — ABNORMAL LOW (ref 4.14–5.80)
RDW: 12.6 % (ref 11.6–15.4)
WBC: 8.4 10*3/uL (ref 3.4–10.8)

## 2021-10-11 LAB — BRAIN NATRIURETIC PEPTIDE: BNP: 250.8 pg/mL — ABNORMAL HIGH (ref 0.0–100.0)

## 2021-10-17 ENCOUNTER — Ambulatory Visit: Payer: Medicaid Other | Admitting: Internal Medicine

## 2021-10-17 DIAGNOSIS — N189 Chronic kidney disease, unspecified: Secondary | ICD-10-CM | POA: Diagnosis not present

## 2021-10-17 DIAGNOSIS — E872 Acidosis, unspecified: Secondary | ICD-10-CM | POA: Diagnosis not present

## 2021-10-17 DIAGNOSIS — N185 Chronic kidney disease, stage 5: Secondary | ICD-10-CM | POA: Diagnosis not present

## 2021-10-17 DIAGNOSIS — D631 Anemia in chronic kidney disease: Secondary | ICD-10-CM | POA: Diagnosis not present

## 2021-10-17 DIAGNOSIS — I770 Arteriovenous fistula, acquired: Secondary | ICD-10-CM | POA: Diagnosis not present

## 2021-10-17 DIAGNOSIS — I639 Cerebral infarction, unspecified: Secondary | ICD-10-CM | POA: Diagnosis not present

## 2021-10-17 DIAGNOSIS — I120 Hypertensive chronic kidney disease with stage 5 chronic kidney disease or end stage renal disease: Secondary | ICD-10-CM | POA: Diagnosis not present

## 2021-10-17 DIAGNOSIS — N2581 Secondary hyperparathyroidism of renal origin: Secondary | ICD-10-CM | POA: Diagnosis not present

## 2021-10-17 DIAGNOSIS — E1022 Type 1 diabetes mellitus with diabetic chronic kidney disease: Secondary | ICD-10-CM | POA: Diagnosis not present

## 2021-10-17 DIAGNOSIS — E871 Hypo-osmolality and hyponatremia: Secondary | ICD-10-CM | POA: Diagnosis not present

## 2021-10-21 ENCOUNTER — Telehealth: Payer: Self-pay

## 2021-10-21 NOTE — Telephone Encounter (Signed)
Contacted pt to go over lab results pt is aware and doesn't have any questions or concerns 

## 2021-10-23 ENCOUNTER — Encounter: Payer: Self-pay | Admitting: Critical Care Medicine

## 2021-10-23 ENCOUNTER — Other Ambulatory Visit: Payer: Self-pay

## 2021-10-23 ENCOUNTER — Ambulatory Visit: Payer: Medicaid Other | Attending: Critical Care Medicine | Admitting: Critical Care Medicine

## 2021-10-23 VITALS — BP 175/90 | HR 99 | Resp 16 | Wt 122.2 lb

## 2021-10-23 DIAGNOSIS — I10 Essential (primary) hypertension: Secondary | ICD-10-CM

## 2021-10-23 DIAGNOSIS — E1065 Type 1 diabetes mellitus with hyperglycemia: Secondary | ICD-10-CM

## 2021-10-23 DIAGNOSIS — E785 Hyperlipidemia, unspecified: Secondary | ICD-10-CM | POA: Diagnosis not present

## 2021-10-23 DIAGNOSIS — N185 Chronic kidney disease, stage 5: Secondary | ICD-10-CM

## 2021-10-23 DIAGNOSIS — E1069 Type 1 diabetes mellitus with other specified complication: Secondary | ICD-10-CM | POA: Diagnosis not present

## 2021-10-23 DIAGNOSIS — E871 Hypo-osmolality and hyponatremia: Secondary | ICD-10-CM

## 2021-10-23 DIAGNOSIS — E10319 Type 1 diabetes mellitus with unspecified diabetic retinopathy without macular edema: Secondary | ICD-10-CM | POA: Diagnosis not present

## 2021-10-23 DIAGNOSIS — R112 Nausea with vomiting, unspecified: Secondary | ICD-10-CM

## 2021-10-23 LAB — GLUCOSE, POCT (MANUAL RESULT ENTRY): POC Glucose: 122 mg/dl — AB (ref 70–99)

## 2021-10-23 NOTE — Assessment & Plan Note (Signed)
Has ophthalmology follow-up 

## 2021-10-23 NOTE — Assessment & Plan Note (Signed)
Essentially end-stage renal disease likely will need dialysis soon we will call discussed with nephrology

## 2021-10-23 NOTE — Assessment & Plan Note (Signed)
Continue lipid therapy 

## 2021-10-23 NOTE — Progress Notes (Signed)
Established Patient Office Visit  Subjective:  Patient ID: George [name], male    DOB: 1986-03-20  Age: 35 y.o. MRN: [id number]  CC:  Chief Complaint  Patient presents with   Hypertension    HPI [facility] presents for return primary care follow-up.  Patient recently in the emergency room as noted below and then admitted for hyponatremia and volume depletion.  Admit date: 09/29/2021 Discharge date: 10/01/2021   Admitted From: Home Disposition: Home   Recommendations for Outpatient Follow-up:  Follow up with PCP in 1-2 weeks Please obtain BMP/CBC in 5 days Follow-up outpatient nephrology in 3-4 weeks     Discharge Condition: Stable CODE STATUS: Full code Diet recommendation: Diabetic   Brief/Interim Summary: 35 year old with history of chronic infarcts/CVA, DM 1, CKD stage V, GERD admitted to the hospital for nausea vomiting and severe hyponatremia.  He was found to have serum sodium of 113, nephrology was consulted and was started on IV fluids.  Over the course of hospital stay his sodium slowly improved and it was normal at 132.  Suspected this was secondary to dehydration and total body salt depletion.  He was seen by nephrology as well.   Today is medically stable to be discharged.     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  Improved with IV fluids, over 48 hours his sodium has normalized 232.  Seen by nephrology.  Stable for discharge.  He is tolerating orals   Nausea vomiting - resolved.  He is tolerating orals   Leukocytosis, resolved - Likely reactive.   CKD stage V; Cr  5.18 - Nephrology consulted, not yet on dialysis.  On bicarb   Diabetes mellitus type 1 - Resume home regimen   Essential hypertension - On Coreg and amlodipine   Previous history of CVA - On Plavix and statin   GERD - Daily Protonix  Hyperlipidemia - Crestor       Body mass index is 22.14 kg/m.             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 Wynetta Emery my partner while I was on vacation as documented below for post hospital follow-up  Dr Wynetta Emery 1. Anasarca associated with disorder of kidney Patient appears to be fluid overloaded associated with end-stage renal disease.  They may have overshot on the amount of fluids that he received during his recent hospitalization.  I recommend that he be seen in the emergency room for evaluation and to be given intravenous diuretics.  However patient was not very keen on wanting to return to the hospital at this time.  I agreed to try him with furosemide for several days.  However I told him to have a very low threshold to be seen in the emergency room if the edema and shortness  of breath persists despite taking the fluid pill.  He expressed understanding. - Comprehensive metabolic panel - Brain natriuretic peptide - ECHOCARDIOGRAM COMPLETE; Future - furosemide (LASIX) 40 MG tablet; 1.5 tab PO daily x 2 days  then 1 tab daily x 5 days.  Dispense: 8 tablet; Refill: 0   2. Dyspnea on exertion See #1 above. - Brain natriuretic peptide - ECHOCARDIOGRAM COMPLETE; Future - furosemide (LASIX) 40 MG tablet; 1.5 tab PO daily x 2 days  then 1 tab daily x 5 days.  Dispense: 8 tablet; Refill: 0   3. Renal hypertension Blood pressure elevated today.  I did  not add any additional medication as I am putting him on furosemide.   4. ESRD (end stage renal disease) ([place]) Keep follow-up appointment with nephrologist in 2 weeks   5. Anemia in stage 5 chronic kidney disease, not on chronic dialysis (HCC) Continue iron supplement - CBC   6. Rash in adult His itching may be due to ESRD - triamcinolone cream (KENALOG) 0.1 %; Apply 1 application topically 2 (two) times daily.  Dispense: 30 g; Refill: 0  Patient states since that visit his edema has resolved and lower extremities he has finished his course of furosemide he has a dose additional for as needed.  The rash in his lower extremities are resolved.  He is less short of breath.  He has an echocardiogram that is pending.  He has seen nephrology since the hospital discharge and they are still holding off on dialysis.  He needs an A1c at this visit but note on arrival blood sugar 122.  Blood pressure 175/90 on arrival.  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) 09/19/2019   Hypertension    Hyponatremia 09/30/2021   Neurologic deficit due to acute ischemic cerebrovascular accident (CVA) ([place]) 06/13/2019   Supraventricular tachycardia ([place])    Type 1 diabetes ([place])     Past Surgical History:  Procedure Laterality Date   AV FISTULA PLACEMENT Left 03/15/2021   Procedure: LEFT UPPER EXTREMITY ARTERIOVENOUS (AV) FISTULA CREATION;  Surgeon: Rosetta Posner, MD;  Location: [place];  Service: Vascular;  Laterality: Left;   BUBBLE STUDY  06/20/2019   Procedure: BUBBLE STUDY;  Surgeon: Fay Records, MD;  Location: [place];  Service: Cardiovascular;;   TEE WITHOUT CARDIOVERSION N/A 06/20/2019   Procedure: TRANSESOPHAGEAL ECHOCARDIOGRAM (TEE);  Surgeon: Fay Records, MD;  Location: [facility];  Service: Cardiovascular;  Laterality: N/A;    Family History   Problem Relation Age of Onset   Hypertension Mother    Diabetes Mother    Hyperlipidemia Mother    Diabetes Father     Social History   Socioeconomic History   Marital status: Single    Spouse name: Not on file   Number of children: Not on file   Years of education: Not on file   Highest education level: Not on file  Occupational History   Not on file  Tobacco Use   Smoking status: Former    Packs/day: 0.25    Types: Cigarettes    Start date: 12/29/2010    Quit date: 06/25/2020    Years since quitting: 1.3   Smokeless tobacco: Never  Vaping Use   Vaping Use: Former   Start date: 12/30/2015   Quit date: 12/29/2017  Substance and Sexual Activity   Alcohol  use: Not on file    Comment: 6 beers/week   Drug use: Not Currently    Types: Marijuana   Sexual activity: Not Currently  Other Topics Concern   Not on file  Social History Narrative   Not on file   Social Determinants of Health   Financial Resource Strain: Not on file  Food Insecurity: Not on file  Transportation Needs: Not on file  Physical Activity: Not on file  Stress: Not on file  Social Connections: Not on file  Intimate Partner Violence: Not on file    Outpatient Medications Prior to Visit  Medication Sig Dispense Refill   acetaminophen (TYLENOL) 325 MG tablet Take 650 mg by mouth every 6 (six) hours as needed for moderate pain or headache.     amLODipine (NORVASC) 10 MG tablet Take 1 tablet (10 mg total) by mouth daily. 90 tablet 2   AURYXIA 1 GM 210 MG(Fe) tablet Take 1 tablet (210 mg total) by mouth 3 (three) times daily. 270 tablet    carvedilol (COREG) 25 MG tablet Take 1 tablet (25 mg total) by mouth 2 (two) times daily with a meal. 180 tablet 1   Cholecalciferol (VITAMIN D3) 50 MCG (2000 UT) TABS Take 200 Units by mouth daily.     clopidogrel (PLAVIX) 75 MG tablet Take 1 tablet (75 mg total) by mouth daily. 90 tablet 3   Continuous Blood Gluc Sensor (DEXCOM G6 SENSOR) MISC Use as instructed to check  blood sugar 3 times daily. 3 each 2   Continuous Blood Gluc Transmit (DEXCOM G6 TRANSMITTER) MISC Inject 1 Device into the skin as directed. Use as instructed to check blood sugar 3 times daily. 1 each 3   furosemide (LASIX) 40 MG tablet Take 40 mg by mouth daily as needed.     glucose blood (ONETOUCH VERIO) test strip Use as instructed to check blood sugar 3 times daily. (Patient taking differently: Use as instructed to check blood sugar 4-5 times daily) 100 each 11   hydrALAZINE (APRESOLINE) 100 MG tablet Take 1 tablet (100 mg total) by mouth 3 (three) times daily. 90 tablet 4   insulin aspart (NOVOLOG FLEXPEN) 100 UNIT/ML FlexPen Inject 5-30 Units into the skin See admin instructions. Mac daily 30 units to include correction scale and snack coverage three times a day with meals 15 mL 6   insulin glargine (LANTUS SOLOSTAR) 100 UNIT/ML Solostar Pen Inject 16 Units into the skin daily. 15 mL 6   Insulin Pen Needle (PEN NEEDLES 3/16") 31G X 5 MM MISC To inject insulin with novolog flex pen, 3 times daily with meals. 100 each 4   pantoprazole (PROTONIX) 40 MG tablet Take 1 tablet (40 mg total) by mouth daily. For stomach protection 30 tablet 11   rosuvastatin (CRESTOR) 20 MG tablet TAKE 1 TABLET(20 MG) BY MOUTH DAILY (Patient taking differently: Take 20 mg by mouth daily.) 30 tablet 4   sodium bicarbonate 650 MG tablet Take 650 mg by mouth 2 (two) times daily.     triamcinolone cream (KENALOG) 0.1 % Apply 1 application topically 2 (two) times daily. (Patient not taking: Reported on 10/23/2021) 30 g 0   furosemide (LASIX) 40 MG tablet 1.5 tab PO daily x 2 days  then 1 tab daily x 5 days. (Patient not taking: Reported on 10/23/2021) 8 tablet 0   No facility-administered medications prior to visit.    No Known Allergies  ROS Review of Systems  Constitutional: Negative.   HENT: Negative.  Negative  for ear pain, postnasal drip, rhinorrhea, sinus pressure, sore throat, trouble swallowing and voice  change.   Eyes: Negative.   Respiratory: Negative.  Negative for apnea, cough, choking, chest tightness, shortness of breath, wheezing and stridor.   Cardiovascular:  Negative for chest pain, palpitations and leg swelling.  Gastrointestinal: Negative.  Negative for abdominal distention, abdominal pain, nausea and vomiting.  Genitourinary: Negative.   Musculoskeletal: Negative.  Negative for arthralgias and myalgias.  Skin:  Positive for rash.  Allergic/Immunologic: Negative.  Negative for environmental allergies and food allergies.  Neurological: Negative.  Negative for dizziness, syncope, weakness and headaches.  Hematological: Negative.  Negative for adenopathy. Does not bruise/bleed easily.  Psychiatric/Behavioral: Negative.  Negative for agitation and sleep disturbance. The patient is not nervous/anxious.      Objective:    Physical Exam Vitals reviewed.  Constitutional:      Appearance: Normal appearance. He is well-developed. He is not diaphoretic.  HENT:     Head: Normocephalic and atraumatic.     Nose: No nasal deformity, septal deviation, mucosal edema or rhinorrhea.     Right Sinus: No maxillary sinus tenderness or frontal sinus tenderness.     Left Sinus: No maxillary sinus tenderness or frontal sinus tenderness.     Mouth/Throat:     Pharynx: No oropharyngeal exudate.  Eyes:     General: No scleral icterus.    Conjunctiva/sclera: Conjunctivae normal.     Pupils: Pupils are equal, round, and reactive to light.  Neck:     Thyroid: No thyromegaly.     Vascular: No carotid bruit or JVD.     Trachea: Trachea normal. No tracheal tenderness or tracheal deviation.  Cardiovascular:     Rate and Rhythm: Normal rate and regular rhythm.     Chest Wall: PMI is not displaced.     Pulses: Normal pulses. No decreased pulses.     Heart sounds: Normal heart sounds, S1 normal and S2 normal. Heart sounds not distant. No murmur heard. No systolic murmur is present.  No diastolic  murmur is present.    No friction rub. No gallop. No S3 or S4 sounds.  Pulmonary:     Effort: No tachypnea, accessory muscle usage or respiratory distress.     Breath sounds: No stridor. No decreased breath sounds, wheezing, rhonchi or rales.  Chest:     Chest wall: No tenderness.  Abdominal:     General: Bowel sounds are normal. There is no distension.     Palpations: Abdomen is soft. Abdomen is not rigid.     Tenderness: There is no abdominal tenderness. There is no guarding or rebound.  Musculoskeletal:        General: Normal range of motion.     Cervical back: Normal range of motion and neck supple. No edema, erythema or rigidity. No muscular tenderness. Normal range of motion.  Lymphadenopathy:     Head:     Right side of head: No submental or submandibular adenopathy.     Left side of head: No submental or submandibular adenopathy.     Cervical: No cervical adenopathy.  Skin:    General: Skin is warm and dry.     Coloration: Skin is not pale.     Findings: Rash present.     Nails: There is no clubbing.     Comments: No edema in the lower extremities there is a pruritic rash bilaterally at the ankles likely from uremic pruritus but no active infection  Neurological:  Mental Status: He is alert and oriented to person, place, and time.     Sensory: No sensory deficit.  Psychiatric:        Speech: Speech normal.        Behavior: Behavior normal.    BP (!) 175/90   Pulse 99   Resp 16   Wt 122 lb 3.2 oz (55.4 kg)   SpO2 95%   BMI 21.65 kg/m  Wt Readings from Last 3 Encounters:  10/23/21 122 lb 3.2 oz (55.4 kg)  10/10/21 141 lb 12.8 oz (64.3 kg)  09/29/21 125 lb (56.7 kg)     Health Maintenance Due  Topic Date Due   COVID-19 Vaccine (4 - Booster for Pfizer series) 02/04/2021   HEMOGLOBIN A1C  10/15/2021    There are no preventive care reminders to display for this patient.  Lab Results  Component Value Date   TSH 1.826 09/30/2021   Lab Results  Component  Value Date   WBC 8.4 10/10/2021   HGB 10.8 (L) 10/10/2021   HCT 31.2 (L) 10/10/2021   MCV 89 10/10/2021   PLT 252 10/10/2021   Lab Results  Component Value Date   NA 138 10/10/2021   K 4.4 10/10/2021   CO2 19 (L) 10/10/2021   GLUCOSE 91 10/10/2021   BUN 46 (H) 10/10/2021   CREATININE 5.21 (H) 10/10/2021   BILITOT 0.4 10/10/2021   ALKPHOS 94 10/10/2021   AST 20 10/10/2021   ALT 19 10/10/2021   PROT 6.4 10/10/2021   ALBUMIN 4.3 10/10/2021   CALCIUM 9.3 10/10/2021   ANIONGAP 7 10/04/2021   EGFR 14 (L) 10/10/2021   Lab Results  Component Value Date   CHOL 118 04/17/2020   Lab Results  Component Value Date   HDL 47 04/17/2020   Lab Results  Component Value Date   LDLCALC 55 04/17/2020   Lab Results  Component Value Date   TRIG 81 04/17/2020   Lab Results  Component Value Date   CHOLHDL 2.5 04/17/2020   Lab Results  Component Value Date   HGBA1C 5.8 (A) 04/15/2021      Assessment & Plan:   Problem List Items Addressed This Visit       Cardiovascular and Mediastinum   HTN (hypertension)    Hypertension exacerbated by end-stage renal disease at this point there is no further medication changes that could be offered patient will affect essentially need to be started on dialysis soon      Relevant Medications   furosemide (LASIX) 40 MG tablet     Digestive   RESOLVED: Nausea & vomiting    Resolved        Endocrine   Hyperlipidemia due to type 1 diabetes mellitus ([place])    Continue lipid therapy      Relevant Medications   furosemide (LASIX) 40 MG tablet   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 ([place])    Has ophthalmology follow-up      Type 1 diabetes mellitus with hyperglycemia (HCC) - Primary   Relevant Orders   POCT glucose (manual entry) (Completed)   Hemoglobin A1c     Genitourinary   Chronic renal disease, stage V ([place])    Essentially end-stage renal disease likely will need dialysis soon we will call discussed with  nephrology        Other   RESOLVED: Hyponatremia    Has resolved       No orders of the defined types were placed in this  encounter.   Follow-up: Return in about 1 month (around 11/23/2021).    Asencion Noble, MD

## 2021-10-23 NOTE — Assessment & Plan Note (Signed)
Type 1 diabetes improved plan to obtain blood hemoglobin A1c no change in insulin dosing

## 2021-10-23 NOTE — Assessment & Plan Note (Signed)
Resolved

## 2021-10-23 NOTE — Assessment & Plan Note (Signed)
Has resolved.

## 2021-10-23 NOTE — Assessment & Plan Note (Signed)
Hypertension exacerbated by end-stage renal disease at this point there is no further medication changes that could be offered patient will affect essentially need to be started on dialysis soon

## 2021-10-23 NOTE — Patient Instructions (Signed)
Keep Echo ultrasound appt  No medication changes  Hold water pill for now   DR Haeleigh Streiff to call kidney doctor  Return Dr Joya Gaskins ONE MONTH  Lab today: A1C

## 2021-10-24 ENCOUNTER — Encounter: Payer: Self-pay | Admitting: Critical Care Medicine

## 2021-10-24 LAB — HEMOGLOBIN A1C
Est. average glucose Bld gHb Est-mCnc: 105 mg/dL
Hgb A1c MFr Bld: 5.3 % (ref 4.8–5.6)

## 2021-10-25 ENCOUNTER — Telehealth: Payer: Self-pay | Admitting: Critical Care Medicine

## 2021-10-25 NOTE — Telephone Encounter (Signed)
-----   Message from Elsie Stain, MD sent at 10/25/2021  8:58 AM EDT -----  ----- Message ----- From: Elsie Stain, MD Sent: 10/24/2021   6:16 AM EDT To: Leamon Arnt  Tell mr Ludolph his A1C is Normal,  keep up good work on his diabetes care and no change in insulin dosing

## 2021-10-25 NOTE — Telephone Encounter (Signed)
Pt was called and voicemail was left. Information was sent to the nurse pool.

## 2021-11-01 ENCOUNTER — Other Ambulatory Visit: Payer: Self-pay

## 2021-11-01 ENCOUNTER — Ambulatory Visit (HOSPITAL_COMMUNITY)
Admission: RE | Admit: 2021-11-01 | Discharge: 2021-11-01 | Disposition: A | Discharge status: Home / Self Care | Payer: Medicaid Other | Source: Ambulatory Visit | Attending: Internal Medicine | Admitting: Internal Medicine

## 2021-11-01 DIAGNOSIS — I3139 Other pericardial effusion (noninflammatory): Secondary | ICD-10-CM | POA: Diagnosis not present

## 2021-11-01 DIAGNOSIS — R0609 Other forms of dyspnea: Secondary | ICD-10-CM

## 2021-11-01 DIAGNOSIS — I10 Essential (primary) hypertension: Secondary | ICD-10-CM | POA: Insufficient documentation

## 2021-11-01 DIAGNOSIS — N049 Nephrotic syndrome with unspecified morphologic changes: Secondary | ICD-10-CM | POA: Diagnosis not present

## 2021-11-01 LAB — ECHOCARDIOGRAM COMPLETE
AR max vel: 2.02 cm2
AV Area VTI: 2.14 cm2
AV Area mean vel: 2.08 cm2
AV Mean grad: 6 mmHg
AV Peak grad: 9.7 mmHg
Ao pk vel: 1.56 m/s
Area-P 1/2: 4.41 cm2
S' Lateral: 3.7 cm

## 2021-11-01 NOTE — Progress Notes (Signed)
  Echocardiogram 2D Echocardiogram has been performed.  George [name] 11/01/2021, 9:01 AM

## 2021-11-04 ENCOUNTER — Telehealth: Payer: Self-pay

## 2021-11-04 NOTE — Telephone Encounter (Signed)
Pt was called and vm was left. Information was sent to the nurse pool.

## 2021-11-04 NOTE — Telephone Encounter (Signed)
-----   Message from Ladell Pier, MD sent at 11/01/2021  6:06 PM EDT ----- Let patient know that the echocardiogram showed that his heart function is in the low normal range.  No medication changes recommended at this time.

## 2021-11-12 DIAGNOSIS — N185 Chronic kidney disease, stage 5: Secondary | ICD-10-CM | POA: Diagnosis not present

## 2021-11-25 ENCOUNTER — Ambulatory Visit: Payer: Self-pay

## 2021-11-25 DIAGNOSIS — Z20828 Contact with and (suspected) exposure to other viral communicable diseases: Secondary | ICD-10-CM | POA: Diagnosis not present

## 2021-11-25 NOTE — Telephone Encounter (Signed)
Pts Mother is calling as the patient tested positive for COVID sx include congestion and cough. And would like to know what can be done.   Mom reports pt. Started having cough and congestion last Monday. Tested positive for COVID 19 Saturday. Asking for a virtual visit tomorrow. If no answer, may leave a message.    Answer Assessment - Initial Assessment Questions 1. COVID-19 DIAGNOSIS: "Who made your COVID-19 diagnosis?" "Was it confirmed by a positive lab test or self-test?" If not diagnosed by a doctor (or NP/PA), ask "Are there lots of cases (community spread) where you live?" Note: See public health department website, if unsure.     Home test 2. COVID-19 EXPOSURE: "Was there any known exposure to COVID before the symptoms began?" CDC Definition of close contact: within 6 feet (2 meters) for a total of 15 minutes or more over a 24-hour period.      Yes 3. ONSET: "When did the COVID-19 symptoms start?"      Last Monday 4. WORST SYMPTOM: "What is your worst symptom?" (e.g., cough, fever, shortness of breath, muscle aches)     Cough 5. COUGH: "Do you have a cough?" If Yes, ask: "How bad is the cough?"       Yes 6. FEVER: "Do you have a fever?" If Yes, ask: "What is your temperature, how was it measured, and when did it start?"     No 7. RESPIRATORY STATUS: "Describe your breathing?" (e.g., shortness of breath, wheezing, unable to speak)      No 8. BETTER-SAME-WORSE: "Are you getting better, staying the same or getting worse compared to yesterday?"  If getting worse, ask, "In what way?"     Same 9. HIGH RISK DISEASE: "Do you have any chronic medical problems?" (e.g., asthma, heart or lung disease, weak immune system, obesity, etc.)     Diabetes 10. VACCINE: "Have you had the COVID-19 vaccine?" If Yes, ask: "Which one, how many shots, when did you get it?"       Yes 11. BOOSTER: "Have you received your COVID-19 booster?" If Yes, ask: "Which one and when did you get it?"       Yes 12.  PREGNANCY: "Is there any chance you are pregnant?" "When was your last menstrual period?"       N/a 13. OTHER SYMPTOMS: "Do you have any other symptoms?"  (e.g., chills, fatigue, headache, loss of smell or taste, muscle pain, sore throat)       Cough, congestion 14. O2 SATURATION MONITOR:  "Do you use an oxygen saturation monitor (pulse oximeter) at home?" If Yes, ask "What is your reading (oxygen level) today?" "What is your usual oxygen saturation reading?" (e.g., 95%)       no  Protocols used: Coronavirus (COVID-19) Diagnosed or Suspected-A-AH

## 2021-11-26 ENCOUNTER — Other Ambulatory Visit: Payer: Self-pay

## 2021-11-26 ENCOUNTER — Ambulatory Visit: Payer: Medicaid Other | Attending: Physician Assistant | Admitting: Physician Assistant

## 2021-11-26 ENCOUNTER — Encounter: Payer: Self-pay | Admitting: Physician Assistant

## 2021-11-26 ENCOUNTER — Ambulatory Visit: Payer: Medicaid Other | Admitting: Critical Care Medicine

## 2021-11-26 DIAGNOSIS — R051 Acute cough: Secondary | ICD-10-CM | POA: Diagnosis not present

## 2021-11-26 DIAGNOSIS — U071 COVID-19: Secondary | ICD-10-CM | POA: Insufficient documentation

## 2021-11-26 DIAGNOSIS — N186 End stage renal disease: Secondary | ICD-10-CM

## 2021-11-26 DIAGNOSIS — E1065 Type 1 diabetes mellitus with hyperglycemia: Secondary | ICD-10-CM | POA: Diagnosis not present

## 2021-11-26 MED ORDER — BENZONATATE 100 MG PO CAPS: 100.0000 mg | ORAL_CAPSULE | Freq: Two times a day (BID) | ORAL | 0 refills | Status: DC | PRN

## 2021-11-26 NOTE — Patient Instructions (Signed)
You will use Tessalon Perles to help you with your cough during the day.  You can continue to use Coricidin as needed.  I encourage you to try Vicks or steamy showers to help with your chest congestion as well.  I am glad that you are feeling better, please let us know if there is anything else we can do for you  Kennieth Rad, PA-C Physician Assistant [facility] [email or web address]   Cough, Adult Coughing is a reflex that clears your throat and your airways (respiratory system). Coughing helps to heal and protect your lungs. It is normal to cough occasionally, but a cough that happens with other symptoms or lasts a long time may be a sign of a condition that needs treatment. An acute cough may only last 2-3 weeks, while a chronic cough may last 8 or more weeks. Coughing is commonly caused by: Infection of the respiratory systemby viruses or bacteria. Breathing in substances that irritate your lungs. Allergies. Asthma. Mucus that runs down the back of your throat (postnasal drip). Smoking. Acid backing up from the stomach into the esophagus (gastroesophageal reflux). Certain medicines. Chronic lung problems. Other medical conditions such as heart failure or a blood clot in the lung (pulmonary embolism). Follow these instructions at home: Medicines Take over-the-counter and prescription medicines only as told by your health care provider. Talk with your health care provider before you take a cough suppressant medicine. Lifestyle  Avoid cigarette smoke. Do not use any products that contain nicotine or tobacco, such as cigarettes, e-cigarettes, and chewing tobacco. If you need help quitting, ask your health care provider. Drink enough fluid to keep your urine pale yellow. Avoid caffeine. Do not drink alcohol if your health care provider tells you not to drink. General instructions  Pay close attention to changes in your cough.  Tell your health care provider about them. Always cover your mouth when you cough. Avoid things that make you cough, such as perfume, candles, cleaning products, or campfire or tobacco smoke. If the air is dry, use a cool mist vaporizer or humidifier in your bedroom or your home to help loosen secretions. If your cough is worse at night, try to sleep in a semi-upright position. Rest as needed. Keep all follow-up visits as told by your health care provider. This is important. Contact a health care provider if you: Have new symptoms. Cough up pus. Have a cough that does not get better after 2-3 weeks or gets worse. Cannot control your cough with cough suppressant medicines and you are losing sleep. Have pain that gets worse or pain that is not helped with medicine. Have a fever. Have unexplained weight loss. Have night sweats. Get help right away if: You cough up blood. You have difficulty breathing. Your heartbeat is very fast. These symptoms may represent a serious problem that is an emergency. Do not wait to see if the symptoms will go away. Get medical help right away. Call your local emergency services (911 in the [place] Do not drive yourself to the hospital. Summary Coughing is a reflex that clears your throat and your airways. It is normal to cough occasionally, but a cough that happens with other symptoms or lasts a long time may be a sign of a condition that needs treatment. Take over-the-counter and prescription medicines only as told by your health care provider. Always cover your mouth when you cough. Contact a health care provider if you have new symptoms or a cough that does not  get better after 2-3 weeks or gets worse. This information is not intended to replace advice given to you by your health care provider. Make sure you discuss any questions you have with your health care provider. Document Revised: 01/03/2019 Document Reviewed: 01/03/2019 Elsevier Patient Education   [place].

## 2021-11-26 NOTE — Progress Notes (Signed)
Patient verified DOB Patient tested positive yesterday. Patient has an appetite and is able to keep things down. Patient reports nasal congestion and chest congestion that is productive with clear sputum Patient denies N/V/Diarrhea. Patient shares household tested positive. Patient reports increased cough and congestion at bedtime and waking up.

## 2021-11-26 NOTE — Progress Notes (Signed)
Established Patient Office Visit  Subjective:  Patient ID: George [name], male    DOB: 1986/09/04  Age: 35 y.o. MRN: [id number]  CC:  Chief Complaint  Patient presents with   Covid Positive   Virtual Visit via Telephone Note  I connected with George [name] on 11/26/21 at  1:50 PM EST by telephone and verified that I am speaking with the correct person using two identifiers.  Location: Patient: [facility]   I discussed the limitations, risks, security and privacy concerns of performing an evaluation and management service by telephone and the availability of in person appointments. I also discussed with the patient that there may be a patient responsible charge related to this service. The patient expressed understanding and agreed to proceed.   History of Present Illness: Reports that he started having upper respiratory symptoms approximately 1 week ago.  Reports that he did take a home COVID test yesterday which was positive.  States that he does feel he has improved since his symptoms began but is still experiencing slight fatigue, productive cough with clear sputum and clear nasal congestion.  Is eating and drinking okay.  Does state that other household members tested positive for COVID as well.  Reports that he has been taking Coricidin with relief, but is only able to take it at bedtime due to adverse effect of somnolence.  States that he is up-to-date on his COVID boosters.   Observations/Objective: Medical history and current medications reviewed, no physical exam completed          Past Medical History:  Diagnosis Date   AKI (acute kidney injury) ([place])    Cerebral infarction ([place])    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) 09/19/2019   Hypertension    Hyponatremia 09/30/2021   Neurologic deficit due to acute ischemic cerebrovascular accident (CVA) ([place]) 06/13/2019    Supraventricular tachycardia ([place])    Type 1 diabetes ([place])     Past Surgical History:  Procedure Laterality Date   AV FISTULA PLACEMENT Left 03/15/2021   Procedure: LEFT UPPER EXTREMITY ARTERIOVENOUS (AV) FISTULA CREATION;  Surgeon: Rosetta Posner, MD;  Location: [place];  Service: Vascular;  Laterality: Left;   BUBBLE STUDY  06/20/2019   Procedure: BUBBLE STUDY;  Surgeon: Fay Records, MD;  Location: [place];  Service: Cardiovascular;;   TEE WITHOUT CARDIOVERSION N/A 06/20/2019   Procedure: TRANSESOPHAGEAL ECHOCARDIOGRAM (TEE);  Surgeon: Fay Records, MD;  Location: [facility] ENDOSCOPY;  Service: Cardiovascular;  Laterality: N/A;    Family History  Problem Relation Age of Onset   Hypertension Mother    Diabetes Mother    Hyperlipidemia Mother    Diabetes Father     Social History   Socioeconomic History   Marital status: Single    Spouse name: Not on file   Number of children: Not on file   Years of education: Not on file   Highest education level: Not on file  Occupational History   Not on file  Tobacco Use   Smoking status: Former    Packs/day: 0.25    Types: Cigarettes    Start date: 12/29/2010    Quit date: 06/25/2020    Years since quitting: 1.4   Smokeless tobacco: Never  Vaping Use   Vaping Use: Former   Start date: 12/30/2015   Quit date: 12/29/2017  Substance and Sexual Activity   Alcohol use: Not on file    Comment: 6 beers/week  Drug use: Not Currently    Types: Marijuana   Sexual activity: Not Currently  Other Topics Concern   Not on file  Social History Narrative   Not on file   Social Determinants of Health   Financial Resource Strain: Not on file  Food Insecurity: Not on file  Transportation Needs: Not on file  Physical Activity: Not on file  Stress: Not on file  Social Connections: Not on file  Intimate Partner Violence: Not on file    Outpatient Medications Prior to Visit  Medication Sig Dispense Refill   acetaminophen (TYLENOL) 325 MG tablet  Take 650 mg by mouth every 6 (six) hours as needed for moderate pain or headache.     amLODipine (NORVASC) 10 MG tablet Take 1 tablet (10 mg total) by mouth daily. 90 tablet 2   AURYXIA 1 GM 210 MG(Fe) tablet Take 1 tablet (210 mg total) by mouth 3 (three) times daily. 270 tablet    carvedilol (COREG) 25 MG tablet Take 1 tablet (25 mg total) by mouth 2 (two) times daily with a meal. 180 tablet 1   Cholecalciferol (VITAMIN D3) 50 MCG (2000 UT) TABS Take 200 Units by mouth daily.     clopidogrel (PLAVIX) 75 MG tablet Take 1 tablet (75 mg total) by mouth daily. 90 tablet 3   Continuous Blood Gluc Sensor (DEXCOM G6 SENSOR) MISC Use as instructed to check blood sugar 3 times daily. 3 each 2   Continuous Blood Gluc Transmit (DEXCOM G6 TRANSMITTER) MISC Inject 1 Device into the skin as directed. Use as instructed to check blood sugar 3 times daily. 1 each 3   furosemide (LASIX) 40 MG tablet Take 40 mg by mouth daily as needed.     glucose blood (ONETOUCH VERIO) test strip Use as instructed to check blood sugar 3 times daily. (Patient taking differently: Use as instructed to check blood sugar 4-5 times daily) 100 each 11   hydrALAZINE (APRESOLINE) 100 MG tablet Take 1 tablet (100 mg total) by mouth 3 (three) times daily. 90 tablet 4   insulin aspart (NOVOLOG FLEXPEN) 100 UNIT/ML FlexPen Inject 5-30 Units into the skin See admin instructions. Mac daily 30 units to include correction scale and snack coverage three times a day with meals 15 mL 6   insulin glargine (LANTUS SOLOSTAR) 100 UNIT/ML Solostar Pen Inject 16 Units into the skin daily. 15 mL 6   Insulin Pen Needle (PEN NEEDLES 3/16") 31G X 5 MM MISC To inject insulin with novolog flex pen, 3 times daily with meals. 100 each 4   pantoprazole (PROTONIX) 40 MG tablet Take 1 tablet (40 mg total) by mouth daily. For stomach protection 30 tablet 11   rosuvastatin (CRESTOR) 20 MG tablet TAKE 1 TABLET(20 MG) BY MOUTH DAILY (Patient taking differently: Take 20 mg  by mouth daily.) 30 tablet 4   sodium bicarbonate 650 MG tablet Take 650 mg by mouth 2 (two) times daily.     triamcinolone cream (KENALOG) 0.1 % Apply 1 application topically 2 (two) times daily. 30 g 0   No facility-administered medications prior to visit.    No Known Allergies  ROS Review of Systems  Constitutional:  Positive for fatigue. Negative for chills and fever.  HENT:  Positive for congestion and rhinorrhea. Negative for ear pain, sinus pressure, sinus pain, sneezing, sore throat and trouble swallowing.   Eyes: Negative.   Respiratory:  Positive for cough. Negative for shortness of breath and wheezing.   Cardiovascular:  Negative for  chest pain.  Gastrointestinal:  Negative for abdominal pain, nausea and vomiting.  Endocrine: Negative.   Genitourinary: Negative.   Musculoskeletal: Negative.   Skin: Negative.   Allergic/Immunologic: Negative.   Neurological: Negative.   Hematological: Negative.   Psychiatric/Behavioral: Negative.       Objective:      There were no vitals taken for this visit. Wt Readings from Last 3 Encounters:  10/23/21 122 lb 3.2 oz (55.4 kg)  10/10/21 141 lb 12.8 oz (64.3 kg)  09/29/21 125 lb (56.7 kg)     Health Maintenance Due  Topic Date Due   COVID-19 Vaccine (4 - Booster for Pfizer series) 02/04/2021   OPHTHALMOLOGY EXAM  10/25/2021    There are no preventive care reminders to display for this patient.  Lab Results  Component Value Date   TSH 1.826 09/30/2021   Lab Results  Component Value Date   WBC 8.4 10/10/2021   HGB 10.8 (L) 10/10/2021   HCT 31.2 (L) 10/10/2021   MCV 89 10/10/2021   PLT 252 10/10/2021   Lab Results  Component Value Date   NA 138 10/10/2021   K 4.4 10/10/2021   CO2 19 (L) 10/10/2021   GLUCOSE 91 10/10/2021   BUN 46 (H) 10/10/2021   CREATININE 5.21 (H) 10/10/2021   BILITOT 0.4 10/10/2021   ALKPHOS 94 10/10/2021   AST 20 10/10/2021   ALT 19 10/10/2021   PROT 6.4 10/10/2021   ALBUMIN 4.3  10/10/2021   CALCIUM 9.3 10/10/2021   ANIONGAP 7 10/04/2021   EGFR 14 (L) 10/10/2021   Lab Results  Component Value Date   CHOL 118 04/17/2020   Lab Results  Component Value Date   HDL 47 04/17/2020   Lab Results  Component Value Date   LDLCALC 55 04/17/2020   Lab Results  Component Value Date   TRIG 81 04/17/2020   Lab Results  Component Value Date   CHOLHDL 2.5 04/17/2020   Lab Results  Component Value Date   HGBA1C 5.3 10/23/2021      Assessment & Plan:   Problem List Items Addressed This Visit   None   No orders of the defined types were placed in this encounter.  Assessment and Plan:  1. COVID-19 virus infection Patient does have a score of 4 for WOEHO-12 risk of complications, patient stated he was up-to-date on his COVID boosters, does state that he has felt improvement of his symptoms, is 7 days out from beginning of symptoms.  Trial Ladona Ridgel, patient education given on supportive care, red flags for prompt reevaluation, current masking guidelines.  - benzonatate (TESSALON) 100 MG capsule; Take 1 capsule (100 mg total) by mouth 2 (two) times daily as needed for cough.  Dispense: 20 capsule; Refill: 0  2. Acute cough  - benzonatate (TESSALON) 100 MG capsule; Take 1 capsule (100 mg total) by mouth 2 (two) times daily as needed for cough.  Dispense: 20 capsule; Refill: 0  3. Type 1 diabetes mellitus with hyperglycemia (HCC)   4. ESRD (end stage renal disease) ([place])    Follow Up Instructions:    I discussed the assessment and treatment plan with the patient. The patient was provided an opportunity to ask questions and all were answered. The patient agreed with the plan and demonstrated an understanding of the instructions.   The patient was advised to call back or seek an in-person evaluation if the symptoms worsen or if the condition fails to improve as anticipated.  I provided 11  minutes of non-face-to-face time during this  encounter.     Loraine Grip Mayers, PA-C

## 2021-11-29 ENCOUNTER — Ambulatory Visit: Payer: Medicaid Other | Admitting: Internal Medicine

## 2021-12-04 DIAGNOSIS — N185 Chronic kidney disease, stage 5: Secondary | ICD-10-CM | POA: Diagnosis not present

## 2021-12-04 DIAGNOSIS — N184 Chronic kidney disease, stage 4 (severe): Secondary | ICD-10-CM | POA: Diagnosis not present

## 2021-12-09 DIAGNOSIS — N186 End stage renal disease: Secondary | ICD-10-CM | POA: Diagnosis not present

## 2021-12-09 DIAGNOSIS — Z992 Dependence on renal dialysis: Secondary | ICD-10-CM | POA: Diagnosis not present

## 2021-12-09 DIAGNOSIS — Z111 Encounter for screening for respiratory tuberculosis: Secondary | ICD-10-CM | POA: Diagnosis not present

## 2021-12-09 DIAGNOSIS — T782XXA Anaphylactic shock, unspecified, initial encounter: Secondary | ICD-10-CM | POA: Insufficient documentation

## 2021-12-10 DIAGNOSIS — Z111 Encounter for screening for respiratory tuberculosis: Secondary | ICD-10-CM | POA: Diagnosis not present

## 2021-12-10 DIAGNOSIS — Z4931 Encounter for adequacy testing for hemodialysis: Secondary | ICD-10-CM | POA: Insufficient documentation

## 2021-12-10 DIAGNOSIS — N186 End stage renal disease: Secondary | ICD-10-CM | POA: Diagnosis not present

## 2021-12-10 DIAGNOSIS — Z992 Dependence on renal dialysis: Secondary | ICD-10-CM | POA: Diagnosis not present

## 2021-12-12 DIAGNOSIS — Z992 Dependence on renal dialysis: Secondary | ICD-10-CM | POA: Diagnosis not present

## 2021-12-12 DIAGNOSIS — Z111 Encounter for screening for respiratory tuberculosis: Secondary | ICD-10-CM | POA: Diagnosis not present

## 2021-12-12 DIAGNOSIS — N186 End stage renal disease: Secondary | ICD-10-CM | POA: Diagnosis not present

## 2021-12-13 DIAGNOSIS — N186 End stage renal disease: Secondary | ICD-10-CM | POA: Diagnosis not present

## 2021-12-13 DIAGNOSIS — Z992 Dependence on renal dialysis: Secondary | ICD-10-CM | POA: Diagnosis not present

## 2021-12-13 DIAGNOSIS — Z111 Encounter for screening for respiratory tuberculosis: Secondary | ICD-10-CM | POA: Diagnosis not present

## 2021-12-16 DIAGNOSIS — N186 End stage renal disease: Secondary | ICD-10-CM | POA: Diagnosis not present

## 2021-12-16 DIAGNOSIS — Z992 Dependence on renal dialysis: Secondary | ICD-10-CM | POA: Diagnosis not present

## 2021-12-17 DIAGNOSIS — Z992 Dependence on renal dialysis: Secondary | ICD-10-CM | POA: Diagnosis not present

## 2021-12-17 DIAGNOSIS — N186 End stage renal disease: Secondary | ICD-10-CM | POA: Diagnosis not present

## 2021-12-20 DIAGNOSIS — N186 End stage renal disease: Secondary | ICD-10-CM | POA: Diagnosis not present

## 2021-12-20 DIAGNOSIS — Z992 Dependence on renal dialysis: Secondary | ICD-10-CM | POA: Diagnosis not present

## 2021-12-23 DIAGNOSIS — N186 End stage renal disease: Secondary | ICD-10-CM | POA: Diagnosis not present

## 2021-12-23 DIAGNOSIS — Z4931 Encounter for adequacy testing for hemodialysis: Secondary | ICD-10-CM | POA: Diagnosis not present

## 2021-12-23 DIAGNOSIS — Z992 Dependence on renal dialysis: Secondary | ICD-10-CM | POA: Diagnosis not present

## 2021-12-24 DIAGNOSIS — N186 End stage renal disease: Secondary | ICD-10-CM | POA: Diagnosis not present

## 2021-12-24 DIAGNOSIS — Z992 Dependence on renal dialysis: Secondary | ICD-10-CM | POA: Diagnosis not present

## 2021-12-24 DIAGNOSIS — Z4931 Encounter for adequacy testing for hemodialysis: Secondary | ICD-10-CM | POA: Diagnosis not present

## 2021-12-26 DIAGNOSIS — N186 End stage renal disease: Secondary | ICD-10-CM | POA: Diagnosis not present

## 2021-12-26 DIAGNOSIS — Z992 Dependence on renal dialysis: Secondary | ICD-10-CM | POA: Diagnosis not present

## 2021-12-26 DIAGNOSIS — Z4931 Encounter for adequacy testing for hemodialysis: Secondary | ICD-10-CM | POA: Diagnosis not present

## 2021-12-27 DIAGNOSIS — Z4931 Encounter for adequacy testing for hemodialysis: Secondary | ICD-10-CM | POA: Diagnosis not present

## 2021-12-27 DIAGNOSIS — Z992 Dependence on renal dialysis: Secondary | ICD-10-CM | POA: Diagnosis not present

## 2021-12-27 DIAGNOSIS — N186 End stage renal disease: Secondary | ICD-10-CM | POA: Diagnosis not present

## 2021-12-28 DIAGNOSIS — N186 End stage renal disease: Secondary | ICD-10-CM | POA: Diagnosis not present

## 2021-12-28 DIAGNOSIS — E1022 Type 1 diabetes mellitus with diabetic chronic kidney disease: Secondary | ICD-10-CM | POA: Diagnosis not present

## 2021-12-28 DIAGNOSIS — Z992 Dependence on renal dialysis: Secondary | ICD-10-CM | POA: Diagnosis not present

## 2021-12-30 DIAGNOSIS — Z992 Dependence on renal dialysis: Secondary | ICD-10-CM | POA: Diagnosis not present

## 2021-12-30 DIAGNOSIS — E211 Secondary hyperparathyroidism, not elsewhere classified: Secondary | ICD-10-CM | POA: Diagnosis not present

## 2021-12-30 DIAGNOSIS — N186 End stage renal disease: Secondary | ICD-10-CM | POA: Diagnosis not present

## 2021-12-31 DIAGNOSIS — N186 End stage renal disease: Secondary | ICD-10-CM | POA: Diagnosis not present

## 2021-12-31 DIAGNOSIS — E211 Secondary hyperparathyroidism, not elsewhere classified: Secondary | ICD-10-CM | POA: Diagnosis not present

## 2021-12-31 DIAGNOSIS — Z992 Dependence on renal dialysis: Secondary | ICD-10-CM | POA: Diagnosis not present

## 2022-01-02 DIAGNOSIS — Z8673 Personal history of transient ischemic attack (TIA), and cerebral infarction without residual deficits: Secondary | ICD-10-CM | POA: Insufficient documentation

## 2022-01-02 DIAGNOSIS — N2581 Secondary hyperparathyroidism of renal origin: Secondary | ICD-10-CM | POA: Insufficient documentation

## 2022-01-02 DIAGNOSIS — D631 Anemia in chronic kidney disease: Secondary | ICD-10-CM | POA: Insufficient documentation

## 2022-01-02 DIAGNOSIS — D509 Iron deficiency anemia, unspecified: Secondary | ICD-10-CM | POA: Insufficient documentation

## 2022-01-02 DIAGNOSIS — E211 Secondary hyperparathyroidism, not elsewhere classified: Secondary | ICD-10-CM | POA: Diagnosis not present

## 2022-01-02 DIAGNOSIS — N189 Chronic kidney disease, unspecified: Secondary | ICD-10-CM | POA: Insufficient documentation

## 2022-01-02 DIAGNOSIS — Z87891 Personal history of nicotine dependence: Secondary | ICD-10-CM | POA: Insufficient documentation

## 2022-01-02 DIAGNOSIS — L299 Pruritus, unspecified: Secondary | ICD-10-CM | POA: Insufficient documentation

## 2022-01-02 DIAGNOSIS — N186 End stage renal disease: Secondary | ICD-10-CM | POA: Diagnosis not present

## 2022-01-02 DIAGNOSIS — Z992 Dependence on renal dialysis: Secondary | ICD-10-CM | POA: Diagnosis not present

## 2022-01-03 DIAGNOSIS — E211 Secondary hyperparathyroidism, not elsewhere classified: Secondary | ICD-10-CM | POA: Diagnosis not present

## 2022-01-03 DIAGNOSIS — N186 End stage renal disease: Secondary | ICD-10-CM | POA: Diagnosis not present

## 2022-01-03 DIAGNOSIS — Z992 Dependence on renal dialysis: Secondary | ICD-10-CM | POA: Diagnosis not present

## 2022-01-06 ENCOUNTER — Ambulatory Visit: Payer: Medicaid Other | Admitting: Internal Medicine

## 2022-01-06 DIAGNOSIS — Z992 Dependence on renal dialysis: Secondary | ICD-10-CM | POA: Diagnosis not present

## 2022-01-06 DIAGNOSIS — N186 End stage renal disease: Secondary | ICD-10-CM | POA: Diagnosis not present

## 2022-01-06 DIAGNOSIS — E211 Secondary hyperparathyroidism, not elsewhere classified: Secondary | ICD-10-CM | POA: Diagnosis not present

## 2022-01-08 NOTE — Progress Notes (Signed)
Established Patient Office Visit  Subjective:  Patient ID: George [name], male    DOB: 1986/03/12  Age: 36 y.o. MRN: [id number] Virtual Visit via Video Note  I connected with [facility] on 01/09/22 at 1030AM by a video enabled telemedicine application and verified that I am speaking with the correct person using two identifiers.   Consent:  I discussed the limitations, risks, security and privacy concerns of performing an evaluation and management service by video visit and the availability of in person appointments. I also discussed with the patient that there may be a patient responsible charge related to this service. The patient expressed understanding and agreed to proceed.  Location of patient: Patient's at home  Location of provider: I am in my office  Persons participating in the televisit with the patient.   No one else on the call    History of Present Illness:   CC: Primary care follow-up  HPI [facility] presents for primary care follow-up.  Patient now has end-stage renal disease and has been undergoing hemodialysis now for several months.  He has a dialysis session later this morning.  Patient is a type I diabetic and states his glycemic control is somewhat improved.  His medications largely are unchanged.  He is off furosemide at this time.  He does maintain iron supplementation.  He does not have any specific complaints.  All his primary care gaps are closed.  Past Medical History:  Diagnosis Date   AKI (acute kidney injury) ([place])    Cerebral infarction ([place])    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) 09/19/2019   Hypertension    Hyponatremia 09/30/2021   Neurologic deficit due to acute ischemic cerebrovascular accident (CVA) ([place]) 06/13/2019   Supraventricular tachycardia ([place])    Type 1 diabetes ([place])     Past Surgical History:  Procedure Laterality Date   AV FISTULA PLACEMENT Left 03/15/2021    Procedure: LEFT UPPER EXTREMITY ARTERIOVENOUS (AV) FISTULA CREATION;  Surgeon: Rosetta Posner, MD;  Location: [place];  Service: Vascular;  Laterality: Left;   BUBBLE STUDY  06/20/2019   Procedure: BUBBLE STUDY;  Surgeon: Fay Records, MD;  Location: [facility] ENDOSCOPY;  Service: Cardiovascular;;   TEE WITHOUT CARDIOVERSION N/A 06/20/2019   Procedure: TRANSESOPHAGEAL ECHOCARDIOGRAM (TEE);  Surgeon: Fay Records, MD;  Location: [facility] ENDOSCOPY;  Service: Cardiovascular;  Laterality: N/A;    Family History  Problem Relation Age of Onset   Hypertension Mother    Diabetes Mother    Hyperlipidemia Mother    Diabetes Father     Social History   Socioeconomic History   Marital status: Single    Spouse name: Not on file   Number of children: Not on file   Years of education: Not on file   Highest education level: Not on file  Occupational History   Not on file  Tobacco Use   Smoking status: Former    Packs/day: 0.25    Types: Cigarettes    Start date: 12/29/2010    Quit date: 06/25/2020    Years since quitting: 1.5   Smokeless tobacco: Never  Vaping Use   Vaping Use: Former   Start date: 12/30/2015   Quit date: 12/29/2017  Substance and Sexual Activity   Alcohol use: Not on file    Comment: 6 beers/week   Drug use: Not Currently    Types: Marijuana   Sexual activity: Not Currently  Other Topics Concern   Not on  file  Social History Narrative   Not on file   Social Determinants of Health   Financial Resource Strain: Not on file  Food Insecurity: Not on file  Transportation Needs: Not on file  Physical Activity: Not on file  Stress: Not on file  Social Connections: Not on file  Intimate Partner Violence: Not on file    Outpatient Medications Prior to Visit  Medication Sig Dispense Refill   Cholecalciferol (VITAMIN D3) 50 MCG (2000 UT) TABS Take 200 Units by mouth daily.     amLODipine (NORVASC) 10 MG tablet Take 1 tablet (10 mg total) by mouth daily. 90 tablet 2   AURYXIA 1 GM 210  MG(Fe) tablet Take 1 tablet (210 mg total) by mouth 3 (three) times daily. 270 tablet    carvedilol (COREG) 25 MG tablet Take 1 tablet (25 mg total) by mouth 2 (two) times daily with a meal. 180 tablet 1   clopidogrel (PLAVIX) 75 MG tablet Take 1 tablet (75 mg total) by mouth daily. 90 tablet 3   hydrALAZINE (APRESOLINE) 100 MG tablet Take 1 tablet (100 mg total) by mouth 3 (three) times daily. 90 tablet 4   insulin aspart (NOVOLOG FLEXPEN) 100 UNIT/ML FlexPen Inject 5-30 Units into the skin See admin instructions. Mac daily 30 units to include correction scale and snack coverage three times a day with meals 15 mL 6   insulin glargine (LANTUS SOLOSTAR) 100 UNIT/ML Solostar Pen Inject 16 Units into the skin daily. 15 mL 6   pantoprazole (PROTONIX) 40 MG tablet Take 1 tablet (40 mg total) by mouth daily. For stomach protection 30 tablet 11   rosuvastatin (CRESTOR) 20 MG tablet TAKE 1 TABLET(20 MG) BY MOUTH DAILY (Patient taking differently: Take 20 mg by mouth daily.) 30 tablet 4   Tuberculin PPD (TUBERSOL ID) Inject into the skin.     Continuous Blood Gluc Sensor (DEXCOM G6 SENSOR) MISC Use as instructed to check blood sugar 3 times daily. 3 each 2   Continuous Blood Gluc Transmit (DEXCOM G6 TRANSMITTER) MISC Inject 1 Device into the skin as directed. Use as instructed to check blood sugar 3 times daily. 1 each 3   glucose blood (ONETOUCH VERIO) test strip Use as instructed to check blood sugar 3 times daily. (Patient not taking: Reported on 01/09/2022) 100 each 11   acetaminophen (TYLENOL) 325 MG tablet Take 650 mg by mouth every 6 (six) hours as needed for moderate pain or headache.     benzonatate (TESSALON) 100 MG capsule Take 1 capsule (100 mg total) by mouth 2 (two) times daily as needed for cough. 20 capsule 0   furosemide (LASIX) 40 MG tablet Take 40 mg by mouth daily as needed. (Patient not taking: Reported on 01/09/2022)     Insulin Pen Needle (PEN NEEDLES 3/16") 31G X 5 MM MISC To inject  insulin with novolog flex pen, 3 times daily with meals. 100 each 4   sodium bicarbonate 650 MG tablet Take 650 mg by mouth 2 (two) times daily.     triamcinolone cream (KENALOG) 0.1 % Apply 1 application topically 2 (two) times daily. 30 g 0   No facility-administered medications prior to visit.    No Known Allergies  ROS Review of Systems  Constitutional:  Negative for chills, diaphoresis and fever.  HENT:  Negative for congestion, hearing loss, nosebleeds, sore throat and tinnitus.   Eyes:  Negative for photophobia and redness.  Respiratory:  Negative for cough, shortness of breath, wheezing and stridor.  Cardiovascular:  Negative for chest pain, palpitations and leg swelling.  Gastrointestinal:  Negative for abdominal pain, blood in stool, constipation, diarrhea, nausea and vomiting.  Endocrine: Negative for polydipsia.  Genitourinary:  Negative for dysuria, flank pain, frequency, hematuria and urgency.  Musculoskeletal:  Negative for back pain, myalgias and neck pain.  Skin:  Negative for rash.  Allergic/Immunologic: Negative for environmental allergies.  Neurological:  Negative for dizziness, tremors, seizures, weakness and headaches.  Hematological:  Does not bruise/bleed easily.  Psychiatric/Behavioral:  Negative for suicidal ideas. The patient is not nervous/anxious.      Objective:    Physical Exam No exam this is a video visit patient is in no distress on video BP 129/69 Comment: recorded at [facility] Jan 9,2023 Wt Readings from Last 3 Encounters:  10/23/21 122 lb 3.2 oz (55.4 kg)  10/10/21 141 lb 12.8 oz (64.3 kg)  09/29/21 125 lb (56.7 kg)     Health Maintenance Due  Topic Date Due   COVID-19 Vaccine (4 - Booster for Pfizer series) 02/04/2021   FOOT EXAM  01/08/2022    There are no preventive care reminders to display for this patient.  Lab Results  Component Value Date   TSH 1.826 09/30/2021   Lab Results  Component Value Date   WBC 8.4  10/10/2021   HGB 10.8 (L) 10/10/2021   HCT 31.2 (L) 10/10/2021   MCV 89 10/10/2021   PLT 252 10/10/2021   Lab Results  Component Value Date   NA 138 10/10/2021   K 4.4 10/10/2021   CO2 19 (L) 10/10/2021   GLUCOSE 91 10/10/2021   BUN 46 (H) 10/10/2021   CREATININE 5.21 (H) 10/10/2021   BILITOT 0.4 10/10/2021   ALKPHOS 94 10/10/2021   AST 20 10/10/2021   ALT 19 10/10/2021   PROT 6.4 10/10/2021   ALBUMIN 4.3 10/10/2021   CALCIUM 9.3 10/10/2021   ANIONGAP 7 10/04/2021   EGFR 14 (L) 10/10/2021   Lab Results  Component Value Date   CHOL 118 04/17/2020   Lab Results  Component Value Date   HDL 47 04/17/2020   Lab Results  Component Value Date   LDLCALC 55 04/17/2020   Lab Results  Component Value Date   TRIG 81 04/17/2020   Lab Results  Component Value Date   CHOLHDL 2.5 04/17/2020   Lab Results  Component Value Date   HGBA1C 5.3 10/23/2021      Assessment & Plan:   Problem List Items Addressed This Visit       Cardiovascular and Mediastinum   HTN (hypertension)    Blood pressure recorded in dialysis center is at goal no changes made refills given      Relevant Medications   amLODipine (NORVASC) 10 MG tablet   carvedilol (COREG) 25 MG tablet   hydrALAZINE (APRESOLINE) 100 MG tablet   rosuvastatin (CRESTOR) 20 MG tablet     Endocrine   Hyperlipidemia due to type 1 diabetes mellitus (HCC)    At goal continue Crestor      Relevant Medications   amLODipine (NORVASC) 10 MG tablet   carvedilol (COREG) 25 MG tablet   hydrALAZINE (APRESOLINE) 100 MG tablet   insulin aspart (NOVOLOG FLEXPEN) 100 UNIT/ML FlexPen   insulin glargine (LANTUS SOLOSTAR) 100 UNIT/ML Solostar Pen   rosuvastatin (CRESTOR) 20 MG tablet   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 ([place])    History of diabetic retinopathy he follows closely with his diabetic eye retinopathy specialist  Relevant Medications   insulin aspart (NOVOLOG FLEXPEN) 100 UNIT/ML FlexPen    insulin glargine (LANTUS SOLOSTAR) 100 UNIT/ML Solostar Pen   rosuvastatin (CRESTOR) 20 MG tablet   Type 1 diabetes (HCC)    Type 1 diabetes with improved glycemic control A1c was 5.3 in October we will recheck when he returns to the office in March no changes in insulin protocol      Relevant Medications   insulin aspart (NOVOLOG FLEXPEN) 100 UNIT/ML FlexPen   insulin glargine (LANTUS SOLOSTAR) 100 UNIT/ML Solostar Pen   rosuvastatin (CRESTOR) 20 MG tablet   Secondary hyperparathyroidism of renal origin ([place])    Monitored and treated by nephrology        Musculoskeletal and Integument   Pruritus, unspecified    Due to uremia improving        Genitourinary   ESRD (end stage renal disease) ([place])    Care per nephrology      Other Visit Diagnoses     Essential hypertension       Relevant Medications   amLODipine (NORVASC) 10 MG tablet   carvedilol (COREG) 25 MG tablet   hydrALAZINE (APRESOLINE) 100 MG tablet   rosuvastatin (CRESTOR) 20 MG tablet   Long term current use of aspirin       Relevant Medications   pantoprazole (PROTONIX) 40 MG tablet       Meds ordered this encounter  Medications   AURYXIA 1 GM 210 MG(Fe) tablet    Sig: Take 1 tablet (210 mg total) by mouth 3 (three) times daily.    Dispense:  270 tablet   amLODipine (NORVASC) 10 MG tablet    Sig: Take 1 tablet (10 mg total) by mouth daily.    Dispense:  90 tablet    Refill:  2   carvedilol (COREG) 25 MG tablet    Sig: Take 1 tablet (25 mg total) by mouth 2 (two) times daily with a meal.    Dispense:  180 tablet    Refill:  1   clopidogrel (PLAVIX) 75 MG tablet    Sig: Take 1 tablet (75 mg total) by mouth daily.    Dispense:  90 tablet    Refill:  3   hydrALAZINE (APRESOLINE) 100 MG tablet    Sig: Take 1 tablet (100 mg total) by mouth 3 (three) times daily.    Dispense:  90 tablet    Refill:  4   insulin aspart (NOVOLOG FLEXPEN) 100 UNIT/ML FlexPen    Sig: Inject 5-30 Units into the skin See  admin instructions. Mac daily 30 units to include correction scale and snack coverage three times a day with meals    Dispense:  15 mL    Refill:  6   insulin glargine (LANTUS SOLOSTAR) 100 UNIT/ML Solostar Pen    Sig: Inject 16 Units into the skin daily.    Dispense:  15 mL    Refill:  6   Insulin Pen Needle (PEN NEEDLES 3/16") 31G X 5 MM MISC    Sig: To inject insulin with novolog flex pen, 3 times daily with meals.    Dispense:  100 each    Refill:  4    Please make sure these pen needles will work the [employer].   pantoprazole (PROTONIX) 40 MG tablet    Sig: Take 1 tablet (40 mg total) by mouth daily. For stomach protection    Dispense:  30 tablet    Refill:  11   rosuvastatin (CRESTOR) 20  MG tablet    Sig: Take 1 tablet (20 mg total) by mouth daily.    Dispense:  90 tablet    Refill:  2   Follow Up Instructions: Patient knows a follow-up exam will occur in March face-to-face Refill sent to his pharmacy   I discussed the assessment and treatment plan with the patient. The patient was provided an opportunity to ask questions and all were answered. The patient agreed with the plan and demonstrated an understanding of the instructions.   The patient was advised to call back or seek an in-person evaluation if the symptoms worsen or if the condition fails to improve as anticipated.  I provided  20 minutes of non-face-to-face time during this encounter  including  median intraservice time , review of notes, labs, imaging, medications  and explaining diagnosis and management to the patient .    Asencion Noble, MD

## 2022-01-09 ENCOUNTER — Other Ambulatory Visit: Payer: Self-pay

## 2022-01-09 ENCOUNTER — Ambulatory Visit: Payer: Medicaid Other | Attending: Critical Care Medicine | Admitting: Critical Care Medicine

## 2022-01-09 ENCOUNTER — Telehealth: Payer: Self-pay | Admitting: *Deleted

## 2022-01-09 ENCOUNTER — Encounter: Payer: Self-pay | Admitting: Critical Care Medicine

## 2022-01-09 VITALS — BP 129/69

## 2022-01-09 DIAGNOSIS — E785 Hyperlipidemia, unspecified: Secondary | ICD-10-CM

## 2022-01-09 DIAGNOSIS — E1065 Type 1 diabetes mellitus with hyperglycemia: Secondary | ICD-10-CM | POA: Diagnosis not present

## 2022-01-09 DIAGNOSIS — I10 Essential (primary) hypertension: Secondary | ICD-10-CM

## 2022-01-09 DIAGNOSIS — N186 End stage renal disease: Secondary | ICD-10-CM

## 2022-01-09 DIAGNOSIS — N2581 Secondary hyperparathyroidism of renal origin: Secondary | ICD-10-CM | POA: Diagnosis not present

## 2022-01-09 DIAGNOSIS — L299 Pruritus, unspecified: Secondary | ICD-10-CM | POA: Diagnosis not present

## 2022-01-09 DIAGNOSIS — E10319 Type 1 diabetes mellitus with unspecified diabetic retinopathy without macular edema: Secondary | ICD-10-CM | POA: Diagnosis not present

## 2022-01-09 DIAGNOSIS — Z992 Dependence on renal dialysis: Secondary | ICD-10-CM | POA: Diagnosis not present

## 2022-01-09 DIAGNOSIS — E1069 Type 1 diabetes mellitus with other specified complication: Secondary | ICD-10-CM | POA: Diagnosis not present

## 2022-01-09 DIAGNOSIS — Z7982 Long term (current) use of aspirin: Secondary | ICD-10-CM

## 2022-01-09 DIAGNOSIS — E1022 Type 1 diabetes mellitus with diabetic chronic kidney disease: Secondary | ICD-10-CM | POA: Diagnosis not present

## 2022-01-09 MED ORDER — AURYXIA 1 GM 210 MG(FE) PO TABS: 210.0000 mg | ORAL_TABLET | Freq: Three times a day (TID) | ORAL | Status: DC

## 2022-01-09 MED ORDER — CARVEDILOL 25 MG PO TABS: 25.0000 mg | ORAL_TABLET | Freq: Two times a day (BID) | ORAL | 1 refills | Status: DC

## 2022-01-09 MED ORDER — "PEN NEEDLES 3/16"" 31G X 5 MM MISC": 4 refills | Status: DC

## 2022-01-09 MED ORDER — NOVOLOG FLEXPEN 100 UNIT/ML ~~LOC~~ SOPN: 5.0000 [IU] | PEN_INJECTOR | SUBCUTANEOUS | 6 refills | Status: DC

## 2022-01-09 MED ORDER — HYDRALAZINE HCL 100 MG PO TABS: 100.0000 mg | ORAL_TABLET | Freq: Three times a day (TID) | ORAL | 4 refills | Status: DC

## 2022-01-09 MED ORDER — AMLODIPINE BESYLATE 10 MG PO TABS: 10.0000 mg | ORAL_TABLET | Freq: Every day | ORAL | 2 refills | Status: DC

## 2022-01-09 MED ORDER — PANTOPRAZOLE SODIUM 40 MG PO TBEC: 40.0000 mg | DELAYED_RELEASE_TABLET | Freq: Every day | ORAL | 11 refills | Status: DC

## 2022-01-09 MED ORDER — LANTUS SOLOSTAR 100 UNIT/ML ~~LOC~~ SOPN: 16.0000 [IU] | PEN_INJECTOR | Freq: Every day | SUBCUTANEOUS | 6 refills | Status: DC

## 2022-01-09 MED ORDER — ROSUVASTATIN CALCIUM 20 MG PO TABS: 20.0000 mg | ORAL_TABLET | Freq: Every day | ORAL | 2 refills | Status: DC

## 2022-01-09 MED ORDER — CLOPIDOGREL BISULFATE 75 MG PO TABS: 75.0000 mg | ORAL_TABLET | Freq: Every day | ORAL | 3 refills | Status: DC

## 2022-01-09 NOTE — Telephone Encounter (Signed)
Copied from [place] [phone]. Topic: General - Other >> Jan 08, 2022  8:59 AM Tessa Lerner A wrote: Reason for CRM: The patient's mother has called to share that the patient's virtual visit on 01/09/22 at 10:30 is still going to work for the patient however the patient will be traveling to dialysis during the course of the visit   Please contact further if needed

## 2022-01-09 NOTE — Assessment & Plan Note (Signed)
Blood pressure recorded in dialysis center is at goal no changes made refills given

## 2022-01-09 NOTE — Assessment & Plan Note (Signed)
Monitored and treated by nephrology

## 2022-01-09 NOTE — Assessment & Plan Note (Signed)
Care per nephrology

## 2022-01-09 NOTE — Assessment & Plan Note (Signed)
History of diabetic retinopathy he follows closely with his diabetic eye retinopathy specialist

## 2022-01-09 NOTE — Assessment & Plan Note (Signed)
Due to uremia improving

## 2022-01-09 NOTE — Assessment & Plan Note (Signed)
Type 1 diabetes with improved glycemic control A1c was 5.3 in October we will recheck when he returns to the office in March no changes in insulin protocol

## 2022-01-09 NOTE — Assessment & Plan Note (Signed)
At goal continue Crestor

## 2022-01-10 ENCOUNTER — Telehealth: Payer: Self-pay

## 2022-01-10 NOTE — Telephone Encounter (Signed)
-----   Message from Elsie Stain, MD sent at 01/09/2022 11:00 AM EST ----- Needs OV with me face to face non dialysis day in March

## 2022-01-10 NOTE — Telephone Encounter (Signed)
Pt has been scheduled and reminder has been mailed.  

## 2022-01-11 DIAGNOSIS — E1022 Type 1 diabetes mellitus with diabetic chronic kidney disease: Secondary | ICD-10-CM | POA: Diagnosis not present

## 2022-01-11 DIAGNOSIS — N186 End stage renal disease: Secondary | ICD-10-CM | POA: Diagnosis not present

## 2022-01-11 DIAGNOSIS — N2581 Secondary hyperparathyroidism of renal origin: Secondary | ICD-10-CM | POA: Diagnosis not present

## 2022-01-11 DIAGNOSIS — Z992 Dependence on renal dialysis: Secondary | ICD-10-CM | POA: Diagnosis not present

## 2022-03-05 ENCOUNTER — Encounter: Payer: Self-pay | Admitting: Critical Care Medicine

## 2022-03-05 ENCOUNTER — Ambulatory Visit: Payer: Medicare Other | Attending: Critical Care Medicine | Admitting: Critical Care Medicine

## 2022-03-05 ENCOUNTER — Other Ambulatory Visit: Payer: Self-pay

## 2022-03-05 VITALS — BP 144/84 | HR 87 | Resp 16 | Wt 117.8 lb

## 2022-03-05 DIAGNOSIS — E1069 Type 1 diabetes mellitus with other specified complication: Secondary | ICD-10-CM

## 2022-03-05 DIAGNOSIS — Z794 Long term (current) use of insulin: Secondary | ICD-10-CM | POA: Insufficient documentation

## 2022-03-05 DIAGNOSIS — Z79899 Other long term (current) drug therapy: Secondary | ICD-10-CM | POA: Diagnosis not present

## 2022-03-05 DIAGNOSIS — Z992 Dependence on renal dialysis: Secondary | ICD-10-CM | POA: Diagnosis not present

## 2022-03-05 DIAGNOSIS — D631 Anemia in chronic kidney disease: Secondary | ICD-10-CM | POA: Diagnosis not present

## 2022-03-05 DIAGNOSIS — E785 Hyperlipidemia, unspecified: Secondary | ICD-10-CM | POA: Diagnosis not present

## 2022-03-05 DIAGNOSIS — I120 Hypertensive chronic kidney disease with stage 5 chronic kidney disease or end stage renal disease: Secondary | ICD-10-CM | POA: Diagnosis not present

## 2022-03-05 DIAGNOSIS — E10319 Type 1 diabetes mellitus with unspecified diabetic retinopathy without macular edema: Secondary | ICD-10-CM | POA: Diagnosis not present

## 2022-03-05 DIAGNOSIS — I10 Essential (primary) hypertension: Secondary | ICD-10-CM | POA: Diagnosis not present

## 2022-03-05 DIAGNOSIS — E1022 Type 1 diabetes mellitus with diabetic chronic kidney disease: Secondary | ICD-10-CM | POA: Insufficient documentation

## 2022-03-05 DIAGNOSIS — E1065 Type 1 diabetes mellitus with hyperglycemia: Secondary | ICD-10-CM | POA: Insufficient documentation

## 2022-03-05 DIAGNOSIS — N186 End stage renal disease: Secondary | ICD-10-CM | POA: Diagnosis not present

## 2022-03-05 LAB — GLUCOSE, POCT (MANUAL RESULT ENTRY): POC Glucose: 120 mg/dl — AB (ref 70–99)

## 2022-03-05 MED ORDER — AURYXIA 1 GM 210 MG(FE) PO TABS: 210.0000 mg | ORAL_TABLET | Freq: Three times a day (TID) | ORAL | 2 refills | Status: DC

## 2022-03-05 NOTE — Assessment & Plan Note (Signed)
A1c now markedly improved continue current insulin program ?

## 2022-03-05 NOTE — Assessment & Plan Note (Signed)
As per renal ?

## 2022-03-05 NOTE — Assessment & Plan Note (Signed)
Improved control on current program no changes made ?

## 2022-03-05 NOTE — Patient Instructions (Signed)
No medication changes ? ?Return Dr Joya Gaskins 5 months ?

## 2022-03-05 NOTE — Assessment & Plan Note (Signed)
Continue statin therapy.

## 2022-03-05 NOTE — Assessment & Plan Note (Signed)
Has follow-up with ophthalmology planned ?

## 2022-03-05 NOTE — Progress Notes (Signed)
Established Patient Office Visit  Subjective:  Patient ID: George [name], male    DOB: October 03, 1986  Age: 36 y.o. MRN: [id number]  CC:  Chief Complaint  Patient presents with   Diabetes   Hypertension    HPI George [name] presents for primary care follow-up.  Patient is now on dialysis Tuesday Thursday Saturdays in [place].  He is down to 4-3/4-hour sessions each.  Recent A1c was down to 5.4.  He is continuing with low-dose short acting insulin and 16 units daily of insulin glargine.  He continues to have the Dexcom system to monitor his sugars.  Blood pressure has improved today on arrival 144/84 the best its been.  He maintains amlodipine and carvedilol.  Also taking hydralazine as well.  He is trying to be more compliant with his diet and improving his exercise program.  He has no other complaints at this visit. 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/19/2019   Hypertension    Hyponatremia 09/30/2021   Neurologic deficit due to acute ischemic cerebrovascular accident (CVA) (HCC) 06/13/2019   Supraventricular tachycardia (HCC)    Type 1 diabetes (HCC)     Past Surgical History:  Procedure Laterality Date   AV FISTULA PLACEMENT Left 03/15/2021   Procedure: LEFT UPPER EXTREMITY ARTERIOVENOUS (AV) FISTULA CREATION;  Surgeon: Larina Earthly, MD;  Location: [facility] OR;  Service: Vascular;  Laterality: Left;   BUBBLE STUDY  06/20/2019   Procedure: BUBBLE STUDY;  Surgeon: Pricilla Riffle, MD;  Location: [name] ENDOSCOPY;  Service: Cardiovascular;;   TEE WITHOUT CARDIOVERSION N/A 06/20/2019   Procedure: TRANSESOPHAGEAL ECHOCARDIOGRAM (TEE);  Surgeon: Pricilla Riffle, MD;  Location: [facility] ENDOSCOPY;  Service: Cardiovascular;  Laterality: N/A;    Family History  Problem Relation Age of  Onset   Hypertension Mother    Diabetes Mother    Hyperlipidemia Mother    Diabetes Father     Social History   Socioeconomic History   Marital status: Single    Spouse name: Not on file   Number of children: Not on file   Years of education: Not on file   Highest education level: Not on file  Occupational History   Not on file  Tobacco Use   Smoking status: Former    Packs/day: 0.25    Types: Cigarettes    Start date: 12/29/2010    Quit date: 06/25/2020    Years since quitting: 1.6   Smokeless tobacco: Never  Vaping Use   Vaping Use: Former   Start date: 12/30/2015   Quit date: 12/29/2017  Substance and Sexual Activity   Alcohol use: Not on file    Comment: 6 beers/week   Drug use: Not Currently    Types: Marijuana   Sexual activity: Not Currently  Other Topics Concern   Not on file  Social History Narrative   Not on file   Social Determinants of Health   Financial Resource Strain: Not on file  Food Insecurity: Not on file  Transportation Needs: Not on file  Physical Activity: Not on file  Stress: Not on file  Social Connections: Not on file  Intimate Partner Violence: Not on file    Outpatient Medications Prior to Visit  Medication Sig Dispense Refill   amLODipine (NORVASC) 10 MG tablet Take 1 tablet (10 mg  total) by mouth daily. 90 tablet 2   carvedilol (COREG) 25 MG tablet Take 1 tablet (25 mg total) by mouth 2 (two) times daily with a meal. 180 tablet 1   Cholecalciferol (VITAMIN D3) 50 MCG (2000 UT) TABS Take 200 Units by mouth daily.     clopidogrel (PLAVIX) 75 MG tablet Take 1 tablet (75 mg total) by mouth daily. 90 tablet 3   Continuous Blood Gluc Sensor (DEXCOM G6 SENSOR) MISC Use as instructed to check blood sugar 3 times daily. 3 each 2   Continuous Blood Gluc Transmit (DEXCOM G6 TRANSMITTER) MISC Inject 1 Device into the skin as directed. Use as instructed to check blood sugar 3 times daily. 1 each 3   glucose blood (ONETOUCH VERIO) test strip Use as  instructed to check blood sugar 3 times daily. 100 each 11   hydrALAZINE (APRESOLINE) 100 MG tablet Take 1 tablet (100 mg total) by mouth 3 (three) times daily. 90 tablet 4   insulin aspart (NOVOLOG FLEXPEN) 100 UNIT/ML FlexPen Inject 5-30 Units into the skin See admin instructions. Mac daily 30 units to include correction scale and snack coverage three times a day with meals 15 mL 6   insulin glargine (LANTUS SOLOSTAR) 100 UNIT/ML Solostar Pen Inject 16 Units into the skin daily. 15 mL 6   Insulin Pen Needle (PEN NEEDLES 3/16") 31G X 5 MM MISC To inject insulin with novolog flex pen, 3 times daily with meals. 100 each 4   pantoprazole (PROTONIX) 40 MG tablet Take 1 tablet (40 mg total) by mouth daily. For stomach protection 30 tablet 11   rosuvastatin (CRESTOR) 20 MG tablet Take 1 tablet (20 mg total) by mouth daily. 90 tablet 2   AURYXIA 1 GM 210 MG(Fe) tablet Take 1 tablet (210 mg total) by mouth 3 (three) times daily. 270 tablet    Ferrous Sulfate (IRON) 325 (65 Fe) MG TABS Take 1 tablet by mouth every morning.     No facility-administered medications prior to visit.    No Known Allergies  ROS Review of Systems  Constitutional: Negative.   HENT: Negative.  Negative for ear pain, postnasal drip, rhinorrhea, sinus pressure, sore throat, trouble swallowing and voice change.   Eyes: Negative.   Respiratory: Negative.  Negative for apnea, cough, choking, chest tightness, shortness of breath, wheezing and stridor.   Cardiovascular: Negative.  Negative for chest pain, palpitations and leg swelling.  Gastrointestinal: Negative.  Negative for abdominal distention, abdominal pain, nausea and vomiting.  Genitourinary: Negative.   Musculoskeletal: Negative.  Negative for arthralgias and myalgias.  Skin: Negative.  Negative for rash.  Allergic/Immunologic: Negative.  Negative for environmental allergies and food allergies.  Neurological: Negative.  Negative for dizziness, syncope, weakness and  headaches.  Hematological: Negative.  Negative for adenopathy. Does not bruise/bleed easily.  Psychiatric/Behavioral: Negative.  Negative for agitation and sleep disturbance. The patient is not nervous/anxious.      Objective:    Physical Exam Vitals reviewed.  Constitutional:      Appearance: Normal appearance. He is well-developed. He is not diaphoretic.  HENT:     Head: Normocephalic and atraumatic.     Nose: No nasal deformity, septal deviation, mucosal edema or rhinorrhea.     Right Sinus: No maxillary sinus tenderness or frontal sinus tenderness.     Left Sinus: No maxillary sinus tenderness or frontal sinus tenderness.     Mouth/Throat:     Pharynx: No oropharyngeal exudate.  Eyes:     General: No scleral  icterus.    Conjunctiva/sclera: Conjunctivae normal.     Pupils: Pupils are equal, round, and reactive to light.  Neck:     Thyroid: No thyromegaly.     Vascular: No carotid bruit or JVD.     Trachea: Trachea normal. No tracheal tenderness or tracheal deviation.  Cardiovascular:     Rate and Rhythm: Normal rate and regular rhythm.     Chest Wall: PMI is not displaced.     Pulses: Normal pulses. No decreased pulses.     Heart sounds: Normal heart sounds, S1 normal and S2 normal. Heart sounds not distant. No murmur heard. No systolic murmur is present.  No diastolic murmur is present.    No friction rub. No gallop. No S3 or S4 sounds.  Pulmonary:     Effort: No tachypnea, accessory muscle usage or respiratory distress.     Breath sounds: No stridor. No decreased breath sounds, wheezing, rhonchi or rales.  Chest:     Chest wall: No tenderness.  Abdominal:     General: Bowel sounds are normal. There is no distension.     Palpations: Abdomen is soft. Abdomen is not rigid.     Tenderness: There is no abdominal tenderness. There is no guarding or rebound.  Musculoskeletal:        General: Normal range of motion.     Cervical back: Normal range of motion and neck supple.  No edema, erythema or rigidity. No muscular tenderness. Normal range of motion.     Comments: Left forearm AV fistula in good functionality  Lymphadenopathy:     Head:     Right side of head: No submental or submandibular adenopathy.     Left side of head: No submental or submandibular adenopathy.     Cervical: No cervical adenopathy.  Skin:    General: Skin is warm and dry.     Coloration: Skin is not pale.     Findings: No rash.     Nails: There is no clubbing.  Neurological:     Mental Status: He is alert and oriented to person, place, and time.     Sensory: No sensory deficit.  Psychiatric:        Speech: Speech normal.        Behavior: Behavior normal.    BP (!) 144/84   Pulse 87   Resp 16   Wt 117 lb 12.8 oz (53.4 kg)   SpO2 98%   BMI 20.87 kg/m  Wt Readings from Last 3 Encounters:  03/05/22 117 lb 12.8 oz (53.4 kg)  10/23/21 122 lb 3.2 oz (55.4 kg)  10/10/21 141 lb 12.8 oz (64.3 kg)     Health Maintenance Due  Topic Date Due   COVID-19 Vaccine (4 - Booster for Pfizer series) 02/04/2021    There are no preventive care reminders to display for this patient.  Lab Results  Component Value Date   TSH 1.826 09/30/2021   Lab Results  Component Value Date   WBC 8.4 10/10/2021   HGB 10.8 (L) 10/10/2021   HCT 31.2 (L) 10/10/2021   MCV 89 10/10/2021   PLT 252 10/10/2021   Lab Results  Component Value Date   NA 138 10/10/2021   K 4.4 10/10/2021   CO2 19 (L) 10/10/2021   GLUCOSE 91 10/10/2021   BUN 46 (H) 10/10/2021   CREATININE 5.21 (H) 10/10/2021   BILITOT 0.4 10/10/2021   ALKPHOS 94 10/10/2021   AST 20 10/10/2021   ALT 19 10/10/2021  PROT 6.4 10/10/2021   ALBUMIN 4.3 10/10/2021   CALCIUM 9.3 10/10/2021   ANIONGAP 7 10/04/2021   EGFR 14 (L) 10/10/2021   Lab Results  Component Value Date   CHOL 118 04/17/2020   Lab Results  Component Value Date   HDL 47 04/17/2020   Lab Results  Component Value Date   LDLCALC 55 04/17/2020   Lab Results   Component Value Date   TRIG 81 04/17/2020   Lab Results  Component Value Date   CHOLHDL 2.5 04/17/2020   Lab Results  Component Value Date   HGBA1C 5.3 10/23/2021      Assessment & Plan:   Problem List Items Addressed This Visit       Cardiovascular and Mediastinum   HTN (hypertension)    Improved control on current program no changes made        Endocrine   Hyperlipidemia due to type 1 diabetes mellitus (HCC)    Continue statin therapy      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 (HCC)    Has follow-up with ophthalmology planned      Type 1 diabetes (HCC) - Primary    A1c now markedly improved continue current insulin program      Relevant Orders   POCT glucose (manual entry) (Completed)     Genitourinary   ESRD (end stage renal disease) (HCC)    As per renal      Anemia in chronic kidney disease    As per renal       Meds ordered this encounter  Medications   AURYXIA 1 GM 210 MG(Fe) tablet    Sig: Take 1 tablet (210 mg total) by mouth 3 (three) times daily.    Dispense:  270 tablet    Refill:  2    Follow-up: Return in about 5 months (around 08/05/2022).    Shan Levans, MD

## 2022-03-11 ENCOUNTER — Other Ambulatory Visit: Payer: Self-pay

## 2022-06-09 ENCOUNTER — Other Ambulatory Visit: Payer: Self-pay | Admitting: Critical Care Medicine

## 2022-06-10 NOTE — Telephone Encounter (Signed)
Refilled 01/09/2022 #180 1 refills. Requested Prescriptions  Pending Prescriptions Disp Refills  . carvedilol (COREG) 25 MG tablet [Pharmacy Med Name: CARVEDILOL 25MG  TABLETS] 180 tablet 1    Sig: TAKE 1 TABLET(25 MG) BY MOUTH TWICE DAILY WITH A MEAL     Cardiovascular: Beta Blockers 3 Failed - 06/09/2022  7:25 PM      Failed - Cr in normal range and within 360 days    Creatinine, Ser  Date Value Ref Range Status  10/10/2021 5.21 (H) 0.76 - 1.27 mg/dL Final   Creatinine, Urine  Date Value Ref Range Status  09/30/2021 35.58 mg/dL Final    Comment:    Performed at [facility] [address] [place], [place] [address]         Failed - Last BP in normal range    BP Readings from Last 1 Encounters:  03/05/22 (!) 144/84         Passed - AST in normal range and within 360 days    AST  Date Value Ref Range Status  10/10/2021 20 0 - 40 IU/L Final         Passed - ALT in normal range and within 360 days    ALT  Date Value Ref Range Status  10/10/2021 19 0 - 44 IU/L Final         Passed - Last Heart Rate in normal range    Pulse Readings from Last 1 Encounters:  03/05/22 87         Passed - Valid encounter within last 6 months    Recent Outpatient Visits          3 months ago Type 1 diabetes mellitus with hyperglycemia [facility])   [place] Elsie Stain, MD   5 months ago Type 1 diabetes mellitus with hyperglycemia [facility])   [place] Elsie Stain, MD   6 months ago COVID-19 virus infection   [facility] [place]   7 months ago Type 1 diabetes mellitus with hyperglycemia [facility])   [place] Elsie Stain, MD   8 months ago Anasarca associated with disorder of kidney   [place], [place]      Future Appointments            In 1 month Joya Gaskins Burnett Harry, MD [place]

## 2022-06-16 ENCOUNTER — Other Ambulatory Visit: Payer: Self-pay | Admitting: Pharmacist

## 2022-06-16 ENCOUNTER — Ambulatory Visit: Payer: Self-pay

## 2022-06-16 MED ORDER — NOVOLOG FLEXPEN 100 UNIT/ML ~~LOC~~ SOPN: PEN_INJECTOR | SUBCUTANEOUS | 2 refills | Status: DC

## 2022-06-16 NOTE — Telephone Encounter (Signed)
[facility] pharmacy called, spoke with [place], [facility]. Advised him of clarifiation on Novolog pen: inject anything from 5 - 30 units depending on slide scale + correction factor. Max dose at one time: 30u. No further information needed.   Summary: Clarification Medication   Pharmacy needs clarification on directions for medication NOVOLOG FLEXPEN 100 UNIT/ML FlexPen   The pharmacy stated that directions are all over the place.    Pharmacy seeking clinical advice.

## 2022-08-06 ENCOUNTER — Encounter: Payer: Self-pay | Admitting: Critical Care Medicine

## 2022-08-06 ENCOUNTER — Ambulatory Visit: Payer: Medicare Other | Attending: Critical Care Medicine | Admitting: Critical Care Medicine

## 2022-08-06 VITALS — BP 114/70 | HR 97 | Temp 98.5°F | Resp 16 | Ht 63.0 in | Wt 123.2 lb

## 2022-08-06 DIAGNOSIS — I10 Essential (primary) hypertension: Secondary | ICD-10-CM

## 2022-08-06 DIAGNOSIS — Z992 Dependence on renal dialysis: Secondary | ICD-10-CM | POA: Diagnosis not present

## 2022-08-06 DIAGNOSIS — F17210 Nicotine dependence, cigarettes, uncomplicated: Secondary | ICD-10-CM | POA: Diagnosis not present

## 2022-08-06 DIAGNOSIS — Z79899 Other long term (current) drug therapy: Secondary | ICD-10-CM | POA: Insufficient documentation

## 2022-08-06 DIAGNOSIS — K219 Gastro-esophageal reflux disease without esophagitis: Secondary | ICD-10-CM

## 2022-08-06 DIAGNOSIS — Z794 Long term (current) use of insulin: Secondary | ICD-10-CM | POA: Insufficient documentation

## 2022-08-06 DIAGNOSIS — N2581 Secondary hyperparathyroidism of renal origin: Secondary | ICD-10-CM

## 2022-08-06 DIAGNOSIS — I120 Hypertensive chronic kidney disease with stage 5 chronic kidney disease or end stage renal disease: Secondary | ICD-10-CM | POA: Insufficient documentation

## 2022-08-06 DIAGNOSIS — Z8673 Personal history of transient ischemic attack (TIA), and cerebral infarction without residual deficits: Secondary | ICD-10-CM

## 2022-08-06 DIAGNOSIS — D509 Iron deficiency anemia, unspecified: Secondary | ICD-10-CM

## 2022-08-06 DIAGNOSIS — E1022 Type 1 diabetes mellitus with diabetic chronic kidney disease: Secondary | ICD-10-CM | POA: Insufficient documentation

## 2022-08-06 DIAGNOSIS — Z7982 Long term (current) use of aspirin: Secondary | ICD-10-CM

## 2022-08-06 DIAGNOSIS — E1065 Type 1 diabetes mellitus with hyperglycemia: Secondary | ICD-10-CM

## 2022-08-06 DIAGNOSIS — Z7902 Long term (current) use of antithrombotics/antiplatelets: Secondary | ICD-10-CM | POA: Insufficient documentation

## 2022-08-06 DIAGNOSIS — D631 Anemia in chronic kidney disease: Secondary | ICD-10-CM | POA: Insufficient documentation

## 2022-08-06 DIAGNOSIS — E103599 Type 1 diabetes mellitus with proliferative diabetic retinopathy without macular edema, unspecified eye: Secondary | ICD-10-CM

## 2022-08-06 DIAGNOSIS — E10319 Type 1 diabetes mellitus with unspecified diabetic retinopathy without macular edema: Secondary | ICD-10-CM | POA: Diagnosis not present

## 2022-08-06 DIAGNOSIS — E785 Hyperlipidemia, unspecified: Secondary | ICD-10-CM | POA: Insufficient documentation

## 2022-08-06 DIAGNOSIS — N186 End stage renal disease: Secondary | ICD-10-CM

## 2022-08-06 LAB — POCT GLYCOSYLATED HEMOGLOBIN (HGB A1C): HbA1c, POC (prediabetic range): 5.8 % (ref 5.7–6.4)

## 2022-08-06 LAB — GLUCOSE, POCT (MANUAL RESULT ENTRY): POC Glucose: 131 mg/dl — AB (ref 70–99)

## 2022-08-06 MED ORDER — PANTOPRAZOLE SODIUM 40 MG PO TBEC: 40.0000 mg | DELAYED_RELEASE_TABLET | Freq: Every day | ORAL | 11 refills | Status: DC

## 2022-08-06 MED ORDER — NOVOLOG FLEXPEN 100 UNIT/ML ~~LOC~~ SOPN: PEN_INJECTOR | SUBCUTANEOUS | 2 refills | Status: DC

## 2022-08-06 MED ORDER — HYDRALAZINE HCL 100 MG PO TABS: 100.0000 mg | ORAL_TABLET | Freq: Three times a day (TID) | ORAL | 4 refills | Status: DC

## 2022-08-06 MED ORDER — AMLODIPINE BESYLATE 10 MG PO TABS: 10.0000 mg | ORAL_TABLET | Freq: Every day | ORAL | 2 refills | Status: DC

## 2022-08-06 MED ORDER — ROSUVASTATIN CALCIUM 20 MG PO TABS
20.0000 mg | ORAL_TABLET | Freq: Every day | ORAL | 2 refills | Status: DC
Start: 2022-08-06 — End: 2023-01-07

## 2022-08-06 MED ORDER — DEXCOM G7 SENSOR MISC
6 refills | Status: DC
Start: 2022-08-06 — End: 2024-07-08

## 2022-08-06 MED ORDER — CARVEDILOL 25 MG PO TABS: 25.0000 mg | ORAL_TABLET | Freq: Two times a day (BID) | ORAL | 1 refills | Status: DC

## 2022-08-06 MED ORDER — LANTUS SOLOSTAR 100 UNIT/ML ~~LOC~~ SOPN
16.0000 [IU] | PEN_INJECTOR | Freq: Every day | SUBCUTANEOUS | 6 refills | Status: DC
Start: 2022-08-06 — End: 2023-01-07

## 2022-08-06 MED ORDER — PEN NEEDLES 3/16" 31G X 5 MM MISC
4 refills | Status: DC
Start: 2022-08-06 — End: 2023-01-07

## 2022-08-06 MED ORDER — CLOPIDOGREL BISULFATE 75 MG PO TABS: 75.0000 mg | ORAL_TABLET | Freq: Every day | ORAL | 3 refills | Status: DC

## 2022-08-06 NOTE — Assessment & Plan Note (Addendum)
A1C today= 5.8% Managed by endocrinology Continue low dose short term insulin and 16 units glargine, refills sent today  Dexcom G7 sensor sent to [facility]

## 2022-08-06 NOTE — Patient Instructions (Signed)
No changes in medications  See Dr Joya Gaskins again in 5 months

## 2022-08-06 NOTE — Progress Notes (Signed)
Established Patient Office Visit  Subjective   Patient ID: George [name], male    DOB: 1986/11/28  Age: 36 y.o. MRN: [id number]  Chief Complaint  Patient presents with   Follow-up    George [name] is a 36 year old male who presents today for follow up. He has history of end stage renal disease, type 1 diabetes, diabetic retinopathy, hypertension, previous cerebrovascular accidents, iron deficiency anemia, and tobacco use.   He is currently on dialysis three times weekly- Tuesday, Thursday and Saturdays- in [place]. He is awaiting transplant, completed testing in June. Notes some recent issues with bleeding at the site of his fistula, and says the dialysis center is aware of this. They had previously recommended he go to ER to get a suture but he waited it out and did not go. He is on plavix. He also has an aneurysm in that area as well and they are aware of that as well.  He is followed by endocrinology for his type 1 diabetes and is maintained on low dose short acting insulin along with 16 units of glargine daily. Today's hemoglobin A1C = 5.8%. He is being followed by opthalmology for his diabetic retinopathy.   Today's blood pressure is 114/70. Hypertension medications include: amlodipine 10 mg daily, carvediolol 25 mg BID, and hydralazine 100 mg TID. Patient is compliant with all medications. He is still smoking cigarettes, however significantly less than the past. Reports only a few each week.   He uses [place] for his history of iron deficiency anemia. Reports he had to go without it for a few weeks due to insurance issues. However, he is back on it now consistently.     Patient Active Problem List   Diagnosis Date Noted   Other disorders of phosphorus metabolism 05/14/2022   Iron deficiency anemia, unspecified 01/02/2022   Personal history of nicotine dependence 01/02/2022   Secondary hyperparathyroidism of renal origin ([place]) 01/02/2022   Anemia in chronic kidney disease  01/02/2022   Personal history of transient ischemic attack (TIA), and cerebral infarction without residual deficits 01/02/2022   Aftercare including intermittent dialysis ([place]) 12/10/2021   Type 1 diabetes ([place])    GERD (gastroesophageal reflux disease)    ESRD (end stage renal disease) ([place])    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 ([place]) 02/07/2020   Proliferative diabetic retinopathy associated with type 1 diabetes mellitus ([place]) 12/19/2019   Hyperlipidemia due to type 1 diabetes mellitus ([place]) 06/27/2019   HTN (hypertension)    History of CVA (cerebrovascular accident) 06/13/2019   Past Medical History:  Diagnosis Date   AKI (acute kidney injury) ([place])    Cerebral infarction ([place])    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) 09/19/2019   Hypertension    Hyponatremia 09/30/2021   Neurologic deficit due to acute ischemic cerebrovascular accident (CVA) ([place]) 06/13/2019   Supraventricular tachycardia ([place])    Type 1 diabetes ([place])    Past Surgical History:  Procedure Laterality Date   AV FISTULA PLACEMENT Left 03/15/2021   Procedure: LEFT UPPER EXTREMITY ARTERIOVENOUS (AV) FISTULA CREATION;  Surgeon: Rosetta Posner, MD;  Location: [place];  Service: Vascular;  Laterality: Left;   BUBBLE STUDY  06/20/2019   Procedure: BUBBLE STUDY;  Surgeon: Fay Records, MD;  Location: [place];  Service: Cardiovascular;;   TEE WITHOUT CARDIOVERSION N/A 06/20/2019   Procedure: TRANSESOPHAGEAL ECHOCARDIOGRAM (TEE);  Surgeon: Fay Records, MD;  Location: [place];  Service: Cardiovascular;  Laterality: N/A;   Social History   Tobacco Use   Smoking status: Former    Packs/day: 0.25    Types: Cigarettes    Start date: 12/29/2010    Quit date: 06/25/2020    Years since quitting: 2.1   Smokeless tobacco: Never  Vaping Use   Vaping Use: Former   Start date: 12/30/2015   Quit date: 12/29/2017   Family History  Problem Relation Age of  Onset   Hypertension Mother    Diabetes Mother    Hyperlipidemia Mother    Diabetes Father    Allergies  Allergen Reactions   Bee Pollen Other (See Comments)    Stuffy nose      Review of Systems  Constitutional:  Negative for chills, diaphoresis and fever.  HENT:  Positive for congestion. Negative for ear pain and sore throat.   Eyes: Negative.   Respiratory:  Negative for cough, shortness of breath and wheezing.   Cardiovascular:  Negative for chest pain and palpitations.  Gastrointestinal:  Negative for constipation, diarrhea, heartburn, nausea and vomiting.  Genitourinary:  Negative for dysuria and frequency.  Musculoskeletal:  Negative for back pain and joint pain.  Skin:  Negative for itching and rash.  Neurological:  Negative for tingling, sensory change and headaches.  Psychiatric/Behavioral:  Negative for depression, hallucinations and suicidal ideas. The patient is not nervous/anxious.       Objective:     BP 114/70 (BP Location: Right Arm, Patient Position: Sitting, Cuff Size: Normal)   Pulse 97   Temp 98.5 F (36.9 C) (Oral)   Resp 16   Ht 5\' 3"  (1.6 m)   Wt 55.9 kg   SpO2 99%   BMI 21.82 kg/m     Physical Exam Constitutional:      Appearance: Normal appearance.  HENT:     Right Ear: Tympanic membrane, ear canal and external ear normal.     Left Ear: Tympanic membrane, ear canal and external ear normal.     Nose: Nose normal.     Mouth/Throat:     Mouth: Mucous membranes are moist.     Pharynx: Oropharynx is clear.  Eyes:     Conjunctiva/sclera: Conjunctivae normal.  Cardiovascular:     Rate and Rhythm: Normal rate and regular rhythm.  Pulmonary:     Effort: Pulmonary effort is normal.     Breath sounds: Normal breath sounds.  Abdominal:     General: Bowel sounds are normal.     Palpations: Abdomen is soft.  Skin:    Capillary Refill: Capillary refill takes less than 2 seconds.     Comments: Left forearm fistula site with areas of  irritation and erythema  Neurological:     Mental Status: He is alert. Mental status is at baseline.      Results for orders placed or performed in visit on 08/06/22  Glucose (CBG)  Result Value Ref Range   POC Glucose 131 (A) 70 - 99 mg/dl  HgB A1c  Result Value Ref Range   Hemoglobin A1C     HbA1c POC (<> result, manual entry)     HbA1c, POC (prediabetic range) 5.8 5.7 - 6.4 %   HbA1c, POC (controlled diabetic range)         The ASCVD Risk score (Arnett DK, et al., 2019) failed to calculate for the following reasons:   The 2019 ASCVD risk score is only valid for ages 34 to 28    Assessment & Plan:   Problem List  Items Addressed This Visit       Cardiovascular and Mediastinum   HTN (hypertension)    Well controlled, continue medication regimen  Refills sent today      Relevant Medications   amLODipine (NORVASC) 10 MG tablet   carvedilol (COREG) 25 MG tablet   hydrALAZINE (APRESOLINE) 100 MG tablet   rosuvastatin (CRESTOR) 20 MG tablet     Digestive   GERD (gastroesophageal reflux disease)    Asymptomatic at this time, doing well with 40 mg PPI as needed Refills sent       Relevant Medications   pantoprazole (PROTONIX) 40 MG tablet     Endocrine   Proliferative diabetic retinopathy associated with type 1 diabetes mellitus ([place])    Followed by opthamology      Relevant Medications   insulin glargine (LANTUS SOLOSTAR) 100 UNIT/ML Solostar Pen   NOVOLOG FLEXPEN 100 UNIT/ML FlexPen   rosuvastatin (CRESTOR) 20 MG tablet   Type 1 diabetes (HCC) - Primary    A1C today= 5.8% Managed by endocrinology Continue low dose short term insulin and 16 units glargine, refills sent today  Dexcom G7 sensor sent to [facility]      Relevant Medications   insulin glargine (LANTUS SOLOSTAR) 100 UNIT/ML Solostar Pen   NOVOLOG FLEXPEN 100 UNIT/ML FlexPen   rosuvastatin (CRESTOR) 20 MG tablet   Other Relevant Orders   Glucose (CBG) (Completed)   HgB A1c (Completed)    Secondary hyperparathyroidism of renal origin ([place])     Genitourinary   ESRD (end stage renal disease) ([place])    On dialysis- 3 times weekly in [place]  Has completed paper work and testing for transplant list qualification   Per patient some recent left forearm fistula prolonged bleeding following dialysis. The dialysis team is aware of this issue. Recommend patient go to emergency room should this happen again, given he is on plavix daily.          Other   History of CVA (cerebrovascular accident)    Continue daily plavix and rosuvastatin, refills sent       Iron deficiency anemia, unspecified    Continue Auryxia       Other Visit Diagnoses     Essential hypertension       Relevant Medications   amLODipine (NORVASC) 10 MG tablet   carvedilol (COREG) 25 MG tablet   hydrALAZINE (APRESOLINE) 100 MG tablet   rosuvastatin (CRESTOR) 20 MG tablet   Long term current use of aspirin       Relevant Medications   pantoprazole (PROTONIX) 40 MG tablet       Return in about 5 months (around 01/06/2023) for diabetes, htn.    William Hamburger, Student-PA

## 2022-08-06 NOTE — Assessment & Plan Note (Signed)
Well controlled, continue medication regimen  Refills sent today

## 2022-08-06 NOTE — Assessment & Plan Note (Signed)
Asymptomatic at this time, doing well with 40 mg PPI as needed Refills sent

## 2022-08-06 NOTE — Assessment & Plan Note (Signed)
Continue Auryxia 

## 2022-08-06 NOTE — Assessment & Plan Note (Signed)
Followed by opthamology

## 2022-08-06 NOTE — Assessment & Plan Note (Addendum)
On dialysis- 3 times weekly in [place]  Has completed paper work and testing for transplant list qualification   Per patient some recent left forearm fistula prolonged bleeding following dialysis. The dialysis team is aware of this issue. Recommend patient go to emergency room should this happen again, given he is on plavix daily.

## 2022-08-06 NOTE — Assessment & Plan Note (Signed)
Continue daily plavix and rosuvastatin, refills sent

## 2022-08-06 NOTE — Progress Notes (Signed)
FU

## 2022-08-06 NOTE — Progress Notes (Deleted)
Established Patient Office Visit  Subjective:  Patient ID: George [name], male    DOB: March 14, 1986  Age: 36 y.o. MRN: [id number]  CC:  No chief complaint on file.   HPI [facility] presents for primary care follow-up.   02/2022 Patient is now on dialysis Tuesday Thursday Saturdays in [place].  He is down to 4-3/4-hour sessions each.  Recent A1c was down to 5.4.  He is continuing with low-dose short acting insulin and 16 units daily of insulin glargine.  He continues to have the Dexcom system to monitor his sugars.  Blood pressure has improved today on arrival 144/84 the best its been.  He maintains amlodipine and carvedilol.  Also taking hydralazine as well.  He is trying to be more compliant with his diet and improving his exercise program.  He has no other complaints at this visit. 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) 09/19/2019   Hypertension    Hyponatremia 09/30/2021   Neurologic deficit due to acute ischemic cerebrovascular accident (CVA) ([place]) 06/13/2019   Supraventricular tachycardia ([place])    Type 1 diabetes ([place])     Past Surgical History:  Procedure Laterality Date   AV FISTULA PLACEMENT Left 03/15/2021   Procedure: LEFT UPPER EXTREMITY ARTERIOVENOUS (AV) FISTULA CREATION;  Surgeon: Rosetta Posner, MD;  Location: [facility];  Service: Vascular;  Laterality: Left;   BUBBLE STUDY  06/20/2019   Procedure: BUBBLE STUDY;  Surgeon: Fay Records, MD;  Location: [facility] ENDOSCOPY;  Service: Cardiovascular;;   TEE WITHOUT CARDIOVERSION N/A 06/20/2019   Procedure: TRANSESOPHAGEAL ECHOCARDIOGRAM (TEE);  Surgeon: Fay Records, MD;  Location: [facility] ENDOSCOPY;  Service: Cardiovascular;  Laterality: N/A;    Family History  Problem Relation Age of Onset   Hypertension Mother     Diabetes Mother    Hyperlipidemia Mother    Diabetes Father     Social History   Socioeconomic History   Marital status: Single    Spouse name: Not on file   Number of children: Not on file   Years of education: Not on file   Highest education level: Not on file  Occupational History   Not on file  Tobacco Use   Smoking status: Former    Packs/day: 0.25    Types: Cigarettes    Start date: 12/29/2010    Quit date: 06/25/2020    Years since quitting: 2.1   Smokeless tobacco: Never  Vaping Use   Vaping Use: Former   Start date: 12/30/2015   Quit date: 12/29/2017  Substance and Sexual Activity   Alcohol use: Not on file    Comment: 6 beers/week   Drug use: Not Currently    Types: Marijuana   Sexual activity: Not Currently  Other Topics Concern   Not on file  Social History Narrative   Not on file   Social Determinants of Health   Financial Resource Strain: Not on file  Food Insecurity: Not on file  Transportation Needs: Not on file  Physical Activity: Not on file  Stress: Not on file  Social Connections: Not on file  Intimate Partner Violence: Not on file    Outpatient Medications Prior to Visit  Medication Sig Dispense Refill   amLODipine (NORVASC) 10 MG tablet Take 1 tablet (10 mg total) by mouth daily. 90 tablet  2   AURYXIA 1 GM 210 MG(Fe) tablet Take 1 tablet (210 mg total) by mouth 3 (three) times daily. 270 tablet 2   carvedilol (COREG) 25 MG tablet Take 1 tablet (25 mg total) by mouth 2 (two) times daily with a meal. 180 tablet 1   Cholecalciferol (VITAMIN D3) 50 MCG (2000 UT) TABS Take 200 Units by mouth daily.     clopidogrel (PLAVIX) 75 MG tablet Take 1 tablet (75 mg total) by mouth daily. 90 tablet 3   Continuous Blood Gluc Sensor (DEXCOM G6 SENSOR) MISC Use as instructed to check blood sugar 3 times daily. 3 each 2   Continuous Blood Gluc Transmit (DEXCOM G6 TRANSMITTER) MISC Inject 1 Device into the skin as directed. Use as instructed to check blood sugar 3  times daily. 1 each 3   glucose blood (ONETOUCH VERIO) test strip Use as instructed to check blood sugar 3 times daily. 100 each 11   hydrALAZINE (APRESOLINE) 100 MG tablet Take 1 tablet (100 mg total) by mouth 3 (three) times daily. 90 tablet 4   insulin glargine (LANTUS SOLOSTAR) 100 UNIT/ML Solostar Pen Inject 16 Units into the skin daily. 15 mL 6   Insulin Pen Needle (PEN NEEDLES 3/16") 31G X 5 MM MISC To inject insulin with novolog flex pen, 3 times daily with meals. 100 each 4   NOVOLOG FLEXPEN 100 UNIT/ML FlexPen Inject 5-30 Units into the skin See admin instructions. Mac daily 30 units to include correction scale and snack coverage three times a day with meals 30 mL 2   pantoprazole (PROTONIX) 40 MG tablet Take 1 tablet (40 mg total) by mouth daily. For stomach protection 30 tablet 11   rosuvastatin (CRESTOR) 20 MG tablet Take 1 tablet (20 mg total) by mouth daily. 90 tablet 2   No facility-administered medications prior to visit.    No Known Allergies  ROS Review of Systems  Constitutional: Negative.   HENT: Negative.  Negative for ear pain, postnasal drip, rhinorrhea, sinus pressure, sore throat, trouble swallowing and voice change.   Eyes: Negative.   Respiratory: Negative.  Negative for apnea, cough, choking, chest tightness, shortness of breath, wheezing and stridor.   Cardiovascular: Negative.  Negative for chest pain, palpitations and leg swelling.  Gastrointestinal: Negative.  Negative for abdominal distention, abdominal pain, nausea and vomiting.  Genitourinary: Negative.   Musculoskeletal: Negative.  Negative for arthralgias and myalgias.  Skin: Negative.  Negative for rash.  Allergic/Immunologic: Negative.  Negative for environmental allergies and food allergies.  Neurological: Negative.  Negative for dizziness, syncope, weakness and headaches.  Hematological: Negative.  Negative for adenopathy. Does not bruise/bleed easily.  Psychiatric/Behavioral: Negative.  Negative  for agitation and sleep disturbance. The patient is not nervous/anxious.       Objective:    Physical Exam Vitals reviewed.  Constitutional:      Appearance: Normal appearance. He is well-developed. He is not diaphoretic.  HENT:     Head: Normocephalic and atraumatic.     Nose: No nasal deformity, septal deviation, mucosal edema or rhinorrhea.     Right Sinus: No maxillary sinus tenderness or frontal sinus tenderness.     Left Sinus: No maxillary sinus tenderness or frontal sinus tenderness.     Mouth/Throat:     Pharynx: No oropharyngeal exudate.  Eyes:     General: No scleral icterus.    Conjunctiva/sclera: Conjunctivae normal.     Pupils: Pupils are equal, round, and reactive to light.  Neck:  Thyroid: No thyromegaly.     Vascular: No carotid bruit or JVD.     Trachea: Trachea normal. No tracheal tenderness or tracheal deviation.  Cardiovascular:     Rate and Rhythm: Normal rate and regular rhythm.     Chest Wall: PMI is not displaced.     Pulses: Normal pulses. No decreased pulses.     Heart sounds: Normal heart sounds, S1 normal and S2 normal. Heart sounds not distant. No murmur heard.    No systolic murmur is present.     No diastolic murmur is present.     No friction rub. No gallop. No S3 or S4 sounds.  Pulmonary:     Effort: No tachypnea, accessory muscle usage or respiratory distress.     Breath sounds: No stridor. No decreased breath sounds, wheezing, rhonchi or rales.  Chest:     Chest wall: No tenderness.  Abdominal:     General: Bowel sounds are normal. There is no distension.     Palpations: Abdomen is soft. Abdomen is not rigid.     Tenderness: There is no abdominal tenderness. There is no guarding or rebound.  Musculoskeletal:        General: Normal range of motion.     Cervical back: Normal range of motion and neck supple. No edema, erythema or rigidity. No muscular tenderness. Normal range of motion.     Comments: Left forearm AV fistula in good  functionality  Lymphadenopathy:     Head:     Right side of head: No submental or submandibular adenopathy.     Left side of head: No submental or submandibular adenopathy.     Cervical: No cervical adenopathy.  Skin:    General: Skin is warm and dry.     Coloration: Skin is not pale.     Findings: No rash.     Nails: There is no clubbing.  Neurological:     Mental Status: He is alert and oriented to person, place, and time.     Sensory: No sensory deficit.  Psychiatric:        Speech: Speech normal.        Behavior: Behavior normal.     There were no vitals taken for this visit. Wt Readings from Last 3 Encounters:  03/05/22 117 lb 12.8 oz (53.4 kg)  10/23/21 122 lb 3.2 oz (55.4 kg)  10/10/21 141 lb 12.8 oz (64.3 kg)     Health Maintenance Due  Topic Date Due   COVID-19 Vaccine (5 - Pfizer series) 08/08/2021   HEMOGLOBIN A1C  04/23/2022   INFLUENZA VACCINE  07/29/2022    There are no preventive care reminders to display for this patient.  Lab Results  Component Value Date   TSH 1.826 09/30/2021   Lab Results  Component Value Date   WBC 8.4 10/10/2021   HGB 10.8 (L) 10/10/2021   HCT 31.2 (L) 10/10/2021   MCV 89 10/10/2021   PLT 252 10/10/2021   Lab Results  Component Value Date   NA 138 10/10/2021   K 4.4 10/10/2021   CO2 19 (L) 10/10/2021   GLUCOSE 91 10/10/2021   BUN 46 (H) 10/10/2021   CREATININE 5.21 (H) 10/10/2021   BILITOT 0.4 10/10/2021   ALKPHOS 94 10/10/2021   AST 20 10/10/2021   ALT 19 10/10/2021   PROT 6.4 10/10/2021   ALBUMIN 4.3 10/10/2021   CALCIUM 9.3 10/10/2021   ANIONGAP 7 10/04/2021   EGFR 14 (L) 10/10/2021   Lab Results  Component Value Date   CHOL 118 04/17/2020   Lab Results  Component Value Date   HDL 47 04/17/2020   Lab Results  Component Value Date   LDLCALC 55 04/17/2020   Lab Results  Component Value Date   TRIG 81 04/17/2020   Lab Results  Component Value Date   CHOLHDL 2.5 04/17/2020   Lab Results   Component Value Date   HGBA1C 5.3 10/23/2021      Assessment & Plan:   Problem List Items Addressed This Visit   None  No orders of the defined types were placed in this encounter.   Follow-up: No follow-ups on file.    Asencion Noble, MD

## 2022-12-22 NOTE — Progress Notes (Unsigned)
[facility] [place]  ASSESSMENT / PLAN: 36 y.o. male with ESRD dialyzing TTS via left forearm basilic vein transposition. He has ulceration about the fistula. Will plan revision on next available non-dialysis day.   CHIEF COMPLAINT: ***  HISTORY OF PRESENT ILLNESS: George [name] is a 36 y.o. male ***  VASCULAR SURGICAL HISTORY: ***  VASCULAR RISK FACTORS: {FINDINGS; POSITIVE NEGATIVE:6845475271} history of stroke / transient ischemic attack. {FINDINGS; POSITIVE NEGATIVE:6845475271} history of coronary artery disease. *** history of PCI. *** history of CABG.  {FINDINGS; POSITIVE NEGATIVE:6845475271} history of diabetes mellitus. Last A1c ***. {FINDINGS; POSITIVE NEGATIVE:6845475271} history of smoking. *** actively smoking. {FINDINGS; POSITIVE NEGATIVE:6845475271} history of hypertension. *** drug regimen with *** control. {FINDINGS; POSITIVE NEGATIVE:6845475271} history of chronic kidney disease.  Last GFR ***. CKD {stage:30421363}. {FINDINGS; POSITIVE NEGATIVE:6845475271} history of chronic obstructive pulmonary disease, treated with ***.  FUNCTIONAL STATUS: ECOG performance status: {findings; ecog performance status:31780} Ambulatory status: {TNHAmbulation:25868}  CAREY 1 AND 3 YEAR INDEX Male (2pts) 75-79 or 80-84 (2pts) >84 (3pts) Dependence in toileting (1pt) Partial or full dependence in dressing (1pt) History of malignant neoplasm (2pts) CHF (3pts) COPD (1pts) CKD (3pts)  0-3 pts 6% 1 year mortality ; 21% 3 year mortality 4-5 pts 12% 1 year mortality ; 36% 3 year mortality >5 pts 21% 1 year mortality; 54% 3 year mortality   Past Medical History:  Diagnosis Date   AKI (acute kidney injury) ([place])    Cerebral infarction ([place])    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) 09/19/2019   Hypertension    Hyponatremia 09/30/2021   Neurologic deficit due to acute ischemic cerebrovascular accident  (CVA) ([place]) 06/13/2019   Supraventricular tachycardia ([place])    Type 1 diabetes ([place])     Past Surgical History:  Procedure Laterality Date   AV FISTULA PLACEMENT Left 03/15/2021   Procedure: LEFT UPPER EXTREMITY ARTERIOVENOUS (AV) FISTULA CREATION;  Surgeon: Rosetta Posner, MD;  Location: [place];  Service: Vascular;  Laterality: Left;   BUBBLE STUDY  06/20/2019   Procedure: BUBBLE STUDY;  Surgeon: Fay Records, MD;  Location: [place];  Service: Cardiovascular;;   TEE WITHOUT CARDIOVERSION N/A 06/20/2019   Procedure: TRANSESOPHAGEAL ECHOCARDIOGRAM (TEE);  Surgeon: Fay Records, MD;  Location: [facility] ENDOSCOPY;  Service: Cardiovascular;  Laterality: N/A;    Family History  Problem Relation Age of Onset   Hypertension Mother    Diabetes Mother    Hyperlipidemia Mother    Diabetes Father     Social History   Socioeconomic History   Marital status: Single    Spouse name: Not on file   Number of children: Not on file   Years of education: Not on file   Highest education level: Not on file  Occupational History   Not on file  Tobacco Use   Smoking status: Former    Packs/day: 0.25    Types: Cigarettes    Start date: 12/29/2010    Quit date: 06/25/2020    Years since quitting: 2.4   Smokeless tobacco: Never  Vaping Use   Vaping Use: Former   Start date: 12/30/2015   Quit date: 12/29/2017  Substance and Sexual Activity   Alcohol use: Not on file    Comment: 6 beers/week   Drug use: Not on file   Sexual activity: Not Currently  Other Topics Concern   Not on file  Social History Narrative   Not on file   Social Determinants of Health  Financial Resource Strain: Not on file  Food Insecurity: Not on file  Transportation Needs: Not on file  Physical Activity: Not on file  Stress: Not on file  Social Connections: Not on file  Intimate Partner Violence: Not on file    Allergies  Allergen Reactions   Bee Pollen Other (See Comments)    Stuffy nose    Current Outpatient  Medications  Medication Sig Dispense Refill   amLODipine (NORVASC) 10 MG tablet Take 1 tablet (10 mg total) by mouth daily. 90 tablet 2   AURYXIA 1 GM 210 MG(Fe) tablet Take 1 tablet (210 mg total) by mouth 3 (three) times daily. 270 tablet 2   carvedilol (COREG) 25 MG tablet Take 1 tablet (25 mg total) by mouth 2 (two) times daily with a meal. 180 tablet 1   Cholecalciferol (VITAMIN D3) 50 MCG (2000 UT) TABS Take 200 Units by mouth daily.     clopidogrel (PLAVIX) 75 MG tablet Take 1 tablet (75 mg total) by mouth daily. 90 tablet 3   Continuous Blood Gluc Sensor (DEXCOM G7 SENSOR) MISC Use to measure blood sugar 3 each 6   glucose blood (ONETOUCH VERIO) test strip Use as instructed to check blood sugar 3 times daily. 100 each 11   hydrALAZINE (APRESOLINE) 100 MG tablet Take 1 tablet (100 mg total) by mouth 3 (three) times daily. 90 tablet 4   insulin glargine (LANTUS SOLOSTAR) 100 UNIT/ML Solostar Pen Inject 16 Units into the skin daily. 15 mL 6   Insulin Pen Needle (PEN NEEDLES 3/16") 31G X 5 MM MISC To inject insulin with novolog flex pen, 3 times daily with meals. 100 each 4   NOVOLOG FLEXPEN 100 UNIT/ML FlexPen Inject 5-30 Units into the skin See admin instructions. Mac daily 30 units to include correction scale and snack coverage three times a day with meals 30 mL 2   pantoprazole (PROTONIX) 40 MG tablet Take 1 tablet (40 mg total) by mouth daily. For stomach protection 30 tablet 11   polyvinyl alcohol (LIQUIFILM TEARS) 1.4 % ophthalmic solution 1 drop as needed for dry eyes.     rosuvastatin (CRESTOR) 20 MG tablet Take 1 tablet (20 mg total) by mouth daily. 90 tablet 2   No current facility-administered medications for this visit.    PHYSICAL EXAM There were no vitals filed for this visit.  Constitutional: *** appearing. *** distress. Appears *** nourished.  Neurologic: CN ***. *** focal findings. *** sensory loss. Psychiatric: *** Mood and affect symmetric and appropriate. Eyes: ***  No icterus. No conjunctival pallor. Ears, nose, throat: *** mucous membranes moist. Midline trachea.  Cardiac: *** rate and rhythm.  Respiratory: *** unlabored. Abdominal: *** soft, non-tender, non-distended.  Peripheral vascular: *** Extremity: *** edema. *** cyanosis. *** pallor.  Skin: *** gangrene. *** ulceration.  Lymphatic: *** Stemmer's sign. *** palpable lymphadenopathy.    PERTINENT LABORATORY AND RADIOLOGIC DATA  Most recent CBC    Latest Ref Rng & Units 10/10/2021   10:34 AM 10/01/2021    3:47 AM 09/30/2021    3:56 AM  CBC  WBC 3.4 - 10.8 x10E3/uL 8.4  6.2  11.1   Hemoglobin 13.0 - 17.7 g/dL 10.8  8.8  10.3   Hematocrit 37.5 - 51.0 % 31.2  24.4  28.3   Platelets 150 - 450 x10E3/uL 252  145  174      Most recent CMP    Latest Ref Rng & Units 10/10/2021   10:34 AM 10/04/2021    1:34  PM 10/01/2021    2:04 PM  CMP  Glucose 70 - 99 mg/dL 91  90  58   BUN 6 - 20 mg/dL 46  42  35   Creatinine 0.76 - 1.27 mg/dL 5.21  5.29  5.54   Sodium 134 - 144 mmol/L 138  130  132   Potassium 3.5 - 5.2 mmol/L 4.4  3.6  3.7   Chloride 96 - 106 mmol/L 102  100  99   CO2 20 - 29 mmol/L _0 Calcium 8.7 - 10.2 mg/dL 9.3  8.4  8.6   Total Protein 6.0 - 8.5 g/dL 6.4     Total Bilirubin 0.0 - 1.2 mg/dL 0.4     Alkaline Phos 44 - 121 IU/L 94     AST 0 - 40 IU/L 20     ALT 0 - 44 IU/L 19       Renal function CrCl cannot be calculated (Patient's most recent lab result is older than the maximum 21 days allowed.).  HbA1c, POC (prediabetic range) (%)  Date Value  08/06/2022 5.8    LDL Chol Calc (NIH)  Date Value Ref Range Status  04/17/2020 55 0 - 99 mg/dL Final     Vascular Imaging: ***  Mohamed Portlock N. Stanford Breed, MD [facility] Phone Number: [phone] 12/22/2022 4:54 PM   Total time spent on preparing this encounter including chart review, data review, collecting history, examining the patient, coordinating care for this  {tnhtimebilling:26202}  Portions of this report may have been transcribed using voice recognition software.  Every effort has been made to ensure accuracy; however, inadvertent computerized transcription errors may still be present.

## 2022-12-23 ENCOUNTER — Ambulatory Visit (INDEPENDENT_AMBULATORY_CARE_PROVIDER_SITE_OTHER): Payer: Medicare Other | Admitting: Vascular Surgery

## 2022-12-23 ENCOUNTER — Encounter: Payer: Self-pay | Admitting: Vascular Surgery

## 2022-12-23 VITALS — BP 138/81 | HR 74 | Temp 97.8°F | Resp 20 | Ht 63.0 in | Wt 121.0 lb

## 2022-12-23 DIAGNOSIS — N186 End stage renal disease: Secondary | ICD-10-CM

## 2023-01-07 ENCOUNTER — Encounter: Payer: Self-pay | Admitting: Physician Assistant

## 2023-01-07 ENCOUNTER — Ambulatory Visit: Payer: Medicare Other | Admitting: Critical Care Medicine

## 2023-01-07 ENCOUNTER — Other Ambulatory Visit: Payer: Self-pay

## 2023-01-07 ENCOUNTER — Ambulatory Visit: Payer: Medicare Other | Attending: Critical Care Medicine | Admitting: Physician Assistant

## 2023-01-07 VITALS — BP 125/70 | HR 84 | Temp 98.6°F | Ht 63.0 in | Wt 119.0 lb

## 2023-01-07 DIAGNOSIS — I120 Hypertensive chronic kidney disease with stage 5 chronic kidney disease or end stage renal disease: Secondary | ICD-10-CM | POA: Insufficient documentation

## 2023-01-07 DIAGNOSIS — E1022 Type 1 diabetes mellitus with diabetic chronic kidney disease: Secondary | ICD-10-CM | POA: Insufficient documentation

## 2023-01-07 DIAGNOSIS — E1065 Type 1 diabetes mellitus with hyperglycemia: Secondary | ICD-10-CM | POA: Diagnosis present

## 2023-01-07 DIAGNOSIS — Z7682 Awaiting organ transplant status: Secondary | ICD-10-CM | POA: Diagnosis not present

## 2023-01-07 DIAGNOSIS — Z794 Long term (current) use of insulin: Secondary | ICD-10-CM | POA: Insufficient documentation

## 2023-01-07 DIAGNOSIS — Z8673 Personal history of transient ischemic attack (TIA), and cerebral infarction without residual deficits: Secondary | ICD-10-CM

## 2023-01-07 DIAGNOSIS — J069 Acute upper respiratory infection, unspecified: Secondary | ICD-10-CM

## 2023-01-07 DIAGNOSIS — Z1152 Encounter for screening for COVID-19: Secondary | ICD-10-CM | POA: Diagnosis not present

## 2023-01-07 DIAGNOSIS — J101 Influenza due to other identified influenza virus with other respiratory manifestations: Secondary | ICD-10-CM | POA: Diagnosis not present

## 2023-01-07 DIAGNOSIS — Z7902 Long term (current) use of antithrombotics/antiplatelets: Secondary | ICD-10-CM | POA: Insufficient documentation

## 2023-01-07 DIAGNOSIS — N186 End stage renal disease: Secondary | ICD-10-CM | POA: Diagnosis not present

## 2023-01-07 DIAGNOSIS — Z992 Dependence on renal dialysis: Secondary | ICD-10-CM | POA: Insufficient documentation

## 2023-01-07 DIAGNOSIS — Z79899 Other long term (current) drug therapy: Secondary | ICD-10-CM | POA: Insufficient documentation

## 2023-01-07 DIAGNOSIS — I10 Essential (primary) hypertension: Secondary | ICD-10-CM

## 2023-01-07 LAB — GLUCOSE, POCT (MANUAL RESULT ENTRY): POC Glucose: 143 mg/dl — AB (ref 70–99)

## 2023-01-07 LAB — POCT GLYCOSYLATED HEMOGLOBIN (HGB A1C): HbA1c, POC (controlled diabetic range): 6.2 % (ref 0.0–7.0)

## 2023-01-07 MED ORDER — CARVEDILOL 25 MG PO TABS: 25.0000 mg | ORAL_TABLET | Freq: Two times a day (BID) | ORAL | 1 refills | Status: DC

## 2023-01-07 MED ORDER — AMLODIPINE BESYLATE 10 MG PO TABS: 10.0000 mg | ORAL_TABLET | Freq: Every day | ORAL | 2 refills | Status: DC

## 2023-01-07 MED ORDER — CLOPIDOGREL BISULFATE 75 MG PO TABS: 75.0000 mg | ORAL_TABLET | Freq: Every day | ORAL | 3 refills | Status: DC

## 2023-01-07 MED ORDER — DEXCOM G6 TRANSMITTER MISC
1.0000 | Freq: Every day | 4 refills | Status: DC | PRN
  Filled 2023-01-07: qty 1, 28d supply, fill #0

## 2023-01-07 MED ORDER — DEXCOM G6 TRANSMITTER MISC: 1.0000 | Freq: Every day | 4 refills | Status: DC | PRN

## 2023-01-07 MED ORDER — NOVOLOG FLEXPEN 100 UNIT/ML ~~LOC~~ SOPN: PEN_INJECTOR | SUBCUTANEOUS | 2 refills | Status: DC

## 2023-01-07 MED ORDER — AMOXICILLIN 500 MG PO CAPS: 500.0000 mg | ORAL_CAPSULE | Freq: Three times a day (TID) | ORAL | 0 refills | Status: AC

## 2023-01-07 MED ORDER — LANTUS SOLOSTAR 100 UNIT/ML ~~LOC~~ SOPN: 16.0000 [IU] | PEN_INJECTOR | Freq: Every day | SUBCUTANEOUS | 6 refills | Status: DC

## 2023-01-07 MED ORDER — DEXCOM G6 RECEIVER DEVI: 1.0000 | Freq: Every day | 0 refills | Status: DC | PRN

## 2023-01-07 MED ORDER — "PEN NEEDLES 3/16"" 31G X 5 MM MISC": 4 refills | Status: DC

## 2023-01-07 MED ORDER — ROSUVASTATIN CALCIUM 20 MG PO TABS: 20.0000 mg | ORAL_TABLET | Freq: Every day | ORAL | 2 refills | Status: DC

## 2023-01-07 MED ORDER — HYDRALAZINE HCL 100 MG PO TABS: 100.0000 mg | ORAL_TABLET | Freq: Three times a day (TID) | ORAL | 4 refills | Status: DC

## 2023-01-07 MED ORDER — DEXCOM G6 RECEIVER DEVI
1.0000 | Freq: Every day | 0 refills | Status: DC | PRN
  Filled 2023-01-07: qty 1, 30d supply, fill #0

## 2023-01-07 NOTE — Patient Instructions (Signed)
Phenylephrine or sudafed for pressure behind your ears/sinuses

## 2023-01-07 NOTE — Progress Notes (Signed)
Patient ID: George [name], male   DOB: 04/26/1986, 37 y.o.   MRN: [id number]   [facility], is a 36 y.o. male  GLO:[id number]  JOA:[id number]  DOB - [birth year]  Chief Complaint  Patient presents with   Diabetes    Dm f/u & HTN.  Requesting a Dexcom G-7 Congestion, burning, cough X2 days.       Subjective:   George [name] is a 37 y.o. male here today for med RF and check up.  ESRD on HD T,Th, Sat.  Doing well.  On transplant list.    He is compliant on meds.  He is also having URI sinus congestion and burning in his nose for about 3 days.  No fever but he feels like he is getting worse.  Minimal cough.  His mom had a cold(neg for covid) and he thinks he has that.  Using zycam for relief No problems updated.  ALLERGIES: Allergies  Allergen Reactions   Bee Pollen Other (See Comments)    Stuffy nose    PAST MEDICAL HISTORY: Past Medical History:  Diagnosis Date   AKI (acute kidney injury) ([place])    Cerebral infarction ([place])    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) 09/19/2019   Hypertension    Hyponatremia 09/30/2021   Neurologic deficit due to acute ischemic cerebrovascular accident (CVA) ([place]) 06/13/2019   Supraventricular tachycardia    Type 1 diabetes ([place])     MEDICATIONS AT HOME: Prior to Admission medications   Medication Sig Start Date End Date Taking? Authorizing Provider  amoxicillin (AMOXIL) 500 MG capsule Take 1 capsule (500 mg total) by mouth 3 (three) times daily for 10 days. 01/07/23 01/17/23 Yes Marrion Accomando, Dionne Bucy, PA-C  AURYXIA 1 GM 210 MG(Fe) tablet Take 1 tablet (210 mg total) by mouth 3 (three) times daily. 03/05/22  Yes Elsie Stain, MD  Cholecalciferol (VITAMIN D3) 50 MCG (2000 UT) TABS Take 200 Units by mouth daily.   Yes [provider]  Continuous Blood Gluc Receiver ([place]) [place] 1 each by Does not apply route daily as needed. 01/07/23  Yes Argentina Donovan, PA-C   Continuous Blood Gluc Sensor (DEXCOM G7 SENSOR) MISC Use to measure blood sugar 08/06/22  Yes Elsie Stain, MD  Continuous Blood Gluc Transmit (DEXCOM G6 TRANSMITTER) MISC 1 each by Does not apply route daily as needed. 01/07/23  Yes Freeman Caldron M, PA-C  glucose blood (ONETOUCH VERIO) test strip Use as instructed to check blood sugar 3 times daily. 01/08/21  Yes Elsie Stain, MD  pantoprazole (PROTONIX) 40 MG tablet Take 1 tablet (40 mg total) by mouth daily. For stomach protection 08/06/22  Yes Elsie Stain, MD  polyvinyl alcohol (LIQUIFILM TEARS) 1.4 % ophthalmic solution 1 drop as needed for dry eyes.   Yes [provider]  amLODipine (NORVASC) 10 MG tablet Take 1 tablet (10 mg total) by mouth daily. 01/07/23   Argentina Donovan, PA-C  carvedilol (COREG) 25 MG tablet Take 1 tablet (25 mg total) by mouth 2 (two) times daily with a meal. 01/07/23 07/06/23  Argentina Donovan, PA-C  clopidogrel (PLAVIX) 75 MG tablet Take 1 tablet (75 mg total) by mouth daily. 01/07/23   Argentina Donovan, PA-C  hydrALAZINE (APRESOLINE) 100 MG tablet Take 1 tablet (100 mg total) by mouth 3 (three) times daily. 01/07/23   Argentina Donovan, PA-C  insulin glargine (LANTUS SOLOSTAR) 100 UNIT/ML Solostar Pen Inject 16 Units into  the skin daily. 01/07/23   Argentina Donovan, PA-C  Insulin Pen Needle (PEN NEEDLES 3/16") 31G X 5 MM MISC To inject insulin with novolog flex pen, 3 times daily with meals. 01/07/23   Argentina Donovan, PA-C  NOVOLOG FLEXPEN 100 UNIT/ML FlexPen Inject 5-30 Units into the skin See admin instructions. Mac daily 30 units to include correction scale and snack coverage three times a day with meals 01/07/23   Freeman Caldron M, PA-C  rosuvastatin (CRESTOR) 20 MG tablet Take 1 tablet (20 mg total) by mouth daily. 01/07/23   Chaka Jefferys, Dionne Bucy, PA-C    ROS: Neg cardiac Neg GI Neg GU Neg MS Neg psych Neg neuro  Objective:   Vitals:   01/07/23 1012  BP: 125/70  Pulse: 84  Temp:  98.6 F (37 C)  TempSrc: Oral  SpO2: 100%  Weight: 119 lb (54 kg)  Height: _0  (1.6 m)   Exam General appearance : Awake, alert, not in any distress. Speech Clear. Not toxic looking HEENT: Atraumatic and Normocephalic, pupils equally reactive to light and accomodation Neck: Supple, no JVD. No cervical lymphadenopathy.  Chest: Good air entry bilaterally, CTAB.  No rales/rhonchi/wheezing CVS: S1 S2 regular, no murmurs.  Abdomen: Bowel sounds present, Non tender and not distended with no gaurding, rigidity or rebound. Extremities: B/L Lower Ext shows no edema, both legs are warm to touch Neurology: Awake alert, and oriented X 3, CN II-XII intact, Non focal Skin: No Rash  Data Review Lab Results  Component Value Date   HGBA1C 6.2 01/07/2023   HGBA1C 5.8 08/06/2022   HGBA1C 5.3 10/23/2021    Assessment & Plan   1. Type 1 diabetes mellitus with hyperglycemia (HCC) Controlled.  Will initiate PA with Cheryle Horsfall for Dexcom - Glucose (CBG) - HgB A1c - Continuous Blood Gluc Receiver ([place]) [place]; 1 each by Does not apply route daily as needed.  Dispense: 1 each; Refill: 0 - Continuous Blood Gluc Transmit (DEXCOM G6 TRANSMITTER) MISC; 1 each by Does not apply route daily as needed.  Dispense: 1 each; Refill: 4 - insulin glargine (LANTUS SOLOSTAR) 100 UNIT/ML Solostar Pen; Inject 16 Units into the skin daily.  Dispense: 15 mL; Refill: 6 - Insulin Pen Needle (PEN NEEDLES 3/16") 31G X 5 MM MISC; To inject insulin with novolog flex pen, 3 times daily with meals.  Dispense: 100 each; Refill: 4 - rosuvastatin (CRESTOR) 20 MG tablet; Take 1 tablet (20 mg total) by mouth daily.  Dispense: 90 tablet; Refill: 2 - NOVOLOG FLEXPEN 100 UNIT/ML FlexPen; Inject 5-30 Units into the skin See admin instructions. Mac daily 30 units to include correction scale and snack coverage three times a day with meals  Dispense: 30 mL; Refill: 2 - Lipid panel - CBC with Differential/Platelet -  Comprehensive metabolic panel  2. Essential hypertension controlled - hydrALAZINE (APRESOLINE) 100 MG tablet; Take 1 tablet (100 mg total) by mouth 3 (three) times daily.  Dispense: 90 tablet; Refill: 4 - amLODipine (NORVASC) 10 MG tablet; Take 1 tablet (10 mg total) by mouth daily.  Dispense: 90 tablet; Refill: 2 - carvedilol (COREG) 25 MG tablet; Take 1 tablet (25 mg total) by mouth 2 (two) times daily with a meal.  Dispense: 180 tablet; Refill: 1 - CBC with Differential/Platelet - Comprehensive metabolic panel  3. Upper respiratory tract infection, unspecified type Fluids, rest, respiratory care.  Phenylephrine for congestion.   - amoxicillin (AMOXIL) 500 MG capsule; Take 1 capsule (500 mg total) by mouth 3 (  three) times daily for 10 days.  Dispense: 30 capsule; Refill: 0 - COVID-19, Flu A+B and RSV  4. ESRD (end stage renal disease) ([place]) T,Th,St dialysis - Continuous Blood Gluc Receiver ([place]) DEVI; 1 each by Does not apply route daily as needed.  Dispense: 1 each; Refill: 0 - Continuous Blood Gluc Transmit (DEXCOM G6 TRANSMITTER) MISC; 1 each by Does not apply route daily as needed.  Dispense: 1 each; Refill: 4 - CBC with Differential/Platelet  5. History of CVA (cerebrovascular accident) - clopidogrel (PLAVIX) 75 MG tablet; Take 1 tablet (75 mg total) by mouth daily.  Dispense: 90 tablet; Refill: 3 - rosuvastatin (CRESTOR) 20 MG tablet; Take 1 tablet (20 mg total) by mouth daily.  Dispense: 90 tablet; Refill: 2 - Lipid panel    Return in about 4 months (around 05/08/2023) for PCP for chronic conditions.  The patient was given clear instructions to go to ER or return to medical center if symptoms don't improve, worsen or new problems develop. The patient verbalized understanding. The patient was told to call to get lab results if they haven't heard anything in the next week.      Freeman Caldron, PA-C [facility] [place],  [place]   01/07/2023, 10:46 AM

## 2023-01-08 LAB — CBC WITH DIFFERENTIAL/PLATELET
Basophils Absolute: 0.1 10*3/uL (ref 0.0–0.2)
Basos: 1 %
EOS (ABSOLUTE): 0.2 10*3/uL (ref 0.0–0.4)
Eos: 4 %
Hematocrit: 31 % — ABNORMAL LOW (ref 37.5–51.0)
Hemoglobin: 10.8 g/dL — ABNORMAL LOW (ref 13.0–17.7)
Immature Grans (Abs): 0 10*3/uL (ref 0.0–0.1)
Immature Granulocytes: 0 %
Lymphocytes Absolute: 0.6 10*3/uL — ABNORMAL LOW (ref 0.7–3.1)
Lymphs: 14 %
MCH: 32.2 pg (ref 26.6–33.0)
MCHC: 34.8 g/dL (ref 31.5–35.7)
MCV: 93 fL (ref 79–97)
Monocytes Absolute: 0.7 10*3/uL (ref 0.1–0.9)
Monocytes: 16 %
Neutrophils Absolute: 3 10*3/uL (ref 1.4–7.0)
Neutrophils: 65 %
Platelets: 108 10*3/uL — ABNORMAL LOW (ref 150–450)
RBC: 3.35 x10E6/uL — ABNORMAL LOW (ref 4.14–5.80)
RDW: 11.9 % (ref 11.6–15.4)
WBC: 4.6 10*3/uL (ref 3.4–10.8)

## 2023-01-08 LAB — COMPREHENSIVE METABOLIC PANEL
ALT: 30 IU/L (ref 0–44)
AST: 18 IU/L (ref 0–40)
Albumin/Globulin Ratio: 2.5 — ABNORMAL HIGH (ref 1.2–2.2)
Albumin: 4.8 g/dL (ref 4.1–5.1)
Alkaline Phosphatase: 73 IU/L (ref 44–121)
BUN/Creatinine Ratio: 4 — ABNORMAL LOW (ref 9–20)
BUN: 22 mg/dL — ABNORMAL HIGH (ref 6–20)
Bilirubin Total: 0.4 mg/dL (ref 0.0–1.2)
CO2: 28 mmol/L (ref 20–29)
Calcium: 9.3 mg/dL (ref 8.7–10.2)
Chloride: 96 mmol/L (ref 96–106)
Creatinine, Ser: 6.04 mg/dL — ABNORMAL HIGH (ref 0.76–1.27)
Globulin, Total: 1.9 g/dL (ref 1.5–4.5)
Glucose: 127 mg/dL — ABNORMAL HIGH (ref 70–99)
Potassium: 3.7 mmol/L (ref 3.5–5.2)
Sodium: 143 mmol/L (ref 134–144)
Total Protein: 6.7 g/dL (ref 6.0–8.5)
eGFR: 12 mL/min/{1.73_m2} — ABNORMAL LOW (ref >59)

## 2023-01-08 LAB — LIPID PANEL
Chol/HDL Ratio: 1.9 ratio (ref 0.0–5.0)
Cholesterol, Total: 93 mg/dL — ABNORMAL LOW (ref 100–199)
HDL: 48 mg/dL (ref >39)
LDL Chol Calc (NIH): 32 mg/dL (ref 0–99)
Triglycerides: 55 mg/dL (ref 0–149)
VLDL Cholesterol Cal: 13 mg/dL (ref 5–40)

## 2023-01-09 LAB — COVID-19, FLU A+B AND RSV
Influenza A, NAA: DETECTED — AB
Influenza B, NAA: NOT DETECTED
RSV, NAA: NOT DETECTED
SARS-CoV-2, NAA: NOT DETECTED

## 2023-01-16 ENCOUNTER — Telehealth: Payer: Self-pay

## 2023-01-16 NOTE — Telephone Encounter (Signed)
-----  Message from Carilyn Goodpasture, RN sent at 01/15/2023  5:48 PM EST -----  ----- Message ----- From: Argentina Donovan, PA-C Sent: 01/13/2023  11:11 AM EST To: Carilyn Goodpasture, RN  Please call patient. You did test positive for the flu. Kidney function is poor(continue to follow with nephrology). If u are not feeling better, please go to an urgent care or emergency dept. thanks, angela mcclung, PA-C

## 2023-01-16 NOTE — Telephone Encounter (Signed)
Pt was called and is aware of results, DOB was confirmed.  ?

## 2023-05-06 ENCOUNTER — Encounter: Payer: Self-pay | Admitting: Critical Care Medicine

## 2023-05-06 ENCOUNTER — Ambulatory Visit: Payer: Medicare Other | Attending: Critical Care Medicine | Admitting: Critical Care Medicine

## 2023-05-06 VITALS — BP 106/59 | HR 83 | Temp 98.3°F | Ht 63.0 in | Wt 122.0 lb

## 2023-05-06 DIAGNOSIS — N2581 Secondary hyperparathyroidism of renal origin: Secondary | ICD-10-CM | POA: Insufficient documentation

## 2023-05-06 DIAGNOSIS — D631 Anemia in chronic kidney disease: Secondary | ICD-10-CM

## 2023-05-06 DIAGNOSIS — Z79899 Other long term (current) drug therapy: Secondary | ICD-10-CM | POA: Diagnosis not present

## 2023-05-06 DIAGNOSIS — Z8673 Personal history of transient ischemic attack (TIA), and cerebral infarction without residual deficits: Secondary | ICD-10-CM | POA: Insufficient documentation

## 2023-05-06 DIAGNOSIS — E1022 Type 1 diabetes mellitus with diabetic chronic kidney disease: Secondary | ICD-10-CM | POA: Insufficient documentation

## 2023-05-06 DIAGNOSIS — Z794 Long term (current) use of insulin: Secondary | ICD-10-CM | POA: Insufficient documentation

## 2023-05-06 DIAGNOSIS — I10 Essential (primary) hypertension: Secondary | ICD-10-CM

## 2023-05-06 DIAGNOSIS — I120 Hypertensive chronic kidney disease with stage 5 chronic kidney disease or end stage renal disease: Secondary | ICD-10-CM | POA: Insufficient documentation

## 2023-05-06 DIAGNOSIS — Z87891 Personal history of nicotine dependence: Secondary | ICD-10-CM | POA: Insufficient documentation

## 2023-05-06 DIAGNOSIS — G2581 Restless legs syndrome: Secondary | ICD-10-CM | POA: Diagnosis not present

## 2023-05-06 DIAGNOSIS — E10319 Type 1 diabetes mellitus with unspecified diabetic retinopathy without macular edema: Secondary | ICD-10-CM | POA: Insufficient documentation

## 2023-05-06 DIAGNOSIS — D509 Iron deficiency anemia, unspecified: Secondary | ICD-10-CM | POA: Insufficient documentation

## 2023-05-06 DIAGNOSIS — E1065 Type 1 diabetes mellitus with hyperglycemia: Secondary | ICD-10-CM

## 2023-05-06 DIAGNOSIS — E103599 Type 1 diabetes mellitus with proliferative diabetic retinopathy without macular edema, unspecified eye: Secondary | ICD-10-CM

## 2023-05-06 DIAGNOSIS — N186 End stage renal disease: Secondary | ICD-10-CM | POA: Diagnosis not present

## 2023-05-06 DIAGNOSIS — Z992 Dependence on renal dialysis: Secondary | ICD-10-CM | POA: Insufficient documentation

## 2023-05-06 DIAGNOSIS — E785 Hyperlipidemia, unspecified: Secondary | ICD-10-CM | POA: Diagnosis not present

## 2023-05-06 NOTE — Assessment & Plan Note (Signed)
Stable at this time 

## 2023-05-06 NOTE — Progress Notes (Signed)
Established Patient Office Visit  Subjective   Patient ID: George [name], male    DOB: Oct 29, 1986  Age: 37 y.o. MRN: [id number]  Chief Complaint  Patient presents with   Diabetes    DM f/u.  Pt suspects restless leg syndrome for both legs- unable to sleep due to legs    07/2022 Mr Jeske is a 37 year old male who presents today for follow up. He has history of end stage renal disease, type 1 diabetes, diabetic retinopathy, hypertension, previous cerebrovascular accidents, iron deficiency anemia, and tobacco use.   He is currently on dialysis three times weekly- Tuesday, Thursday and Saturdays- in [place]. He is awaiting transplant, completed testing in June. Notes some recent issues with bleeding at the site of his fistula, and says the dialysis center is aware of this. They had previously recommended he go to ER to get a suture but he waited it out and did not go. He is on plavix. He also has an aneurysm in that area as well and they are aware of that as well.  He is followed by endocrinology for his type 1 diabetes and is maintained on low dose short acting insulin along with 16 units of glargine daily. Today's hemoglobin A1C = 5.8%. He is being followed by opthalmology for his diabetic retinopathy.   Today's blood pressure is 114/70. Hypertension medications include: amlodipine 10 mg daily, carvediolol 25 mg BID, and hydralazine 100 mg TID. Patient is compliant with all medications. He is still smoking cigarettes, however significantly less than the past. Reports only a few each week.   He uses [place] for his history of iron deficiency anemia. Reports he had to go without it for a few weeks due to insurance issues. However, he is back on it now consistently.   05/06/23 Patient seen in return follow-up for his type 1 diabetes last seen last summer but did see PA Mcclung in January.  Patient overall doing well A1c is 5.8 he is on subcutaneous long and short acting insulin at this  time and does receive dialysis Tuesday Thursday Saturday.  He is on a transplant list and is waiting.  His largest complaint today is that of restless legs.  This is never been evaluated before.  It is uncertain if this is related to his renal situation.    Patient Active Problem List   Diagnosis Date Noted   Restless legs 05/06/2023   Other disorders of phosphorus metabolism 05/14/2022   Iron deficiency anemia, unspecified 01/02/2022   Personal history of nicotine dependence 01/02/2022   Secondary hyperparathyroidism of renal origin (HCC) 01/02/2022   Anemia in chronic kidney disease 01/02/2022   Personal history of transient ischemic attack (TIA), and cerebral infarction without residual deficits 01/02/2022   Aftercare including intermittent dialysis (HCC) 12/10/2021   Type 1 diabetes (HCC)    GERD (gastroesophageal reflux disease)    ESRD (end stage renal disease) (HCC)    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 (HCC) 02/07/2020   Proliferative diabetic retinopathy associated with type 1 diabetes mellitus (HCC) 12/19/2019   Hyperlipidemia due to type 1 diabetes mellitus (HCC) 06/27/2019   HTN (hypertension)    History of CVA (cerebrovascular accident) 06/13/2019   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/19/2019   Hypertension    Hyponatremia 09/30/2021  Neurologic deficit due to acute ischemic cerebrovascular accident (CVA) (HCC) 06/13/2019   Supraventricular tachycardia    Type 1 diabetes (HCC)    Past Surgical History:  Procedure Laterality Date   AV FISTULA PLACEMENT Left 03/15/2021   Procedure: LEFT UPPER EXTREMITY ARTERIOVENOUS (AV) FISTULA CREATION;  Surgeon: Larina Earthly, MD;  Location: [facility] OR;  Service: Vascular;  Laterality: Left;   BUBBLE STUDY  06/20/2019   Procedure: BUBBLE STUDY;  Surgeon: Pricilla Riffle, MD;  Location: [facility] ENDOSCOPY;  Service: Cardiovascular;;   TEE WITHOUT CARDIOVERSION N/A 06/20/2019   Procedure: TRANSESOPHAGEAL ECHOCARDIOGRAM (TEE);  Surgeon: Pricilla Riffle, MD;  Location[facility] ENDOSCOPY;  Service: Cardiovascular;  Laterality: N/A;   Social History   Tobacco Use   Smoking status: Former    Packs/day: .25    Types: Cigarettes    Start date: 12/29/2010    Quit date: 06/25/2020    Years since quitting: 2.8   Smokeless tobacco: Never  Vaping Use   Vaping Use: Former   Start date: 12/30/2015   Quit date: 12/29/2017   Family History  Problem Relation Age of Onset   Hypertension Mother    Diabetes Mother    Hyperlipidemia Mother    Diabetes Father    Allergies  Allergen Reactions   Bee Pollen Other (See Comments)    Stuffy nose      Review of Systems  Constitutional:  Negative for chills, diaphoresis and fever.  HENT:  Negative for congestion, ear pain and sore throat.   Eyes: Negative.   Respiratory:  Negative for cough, shortness of breath and wheezing.   Cardiovascular:  Negative for chest pain and palpitations.  Gastrointestinal:  Negative for constipation, diarrhea, heartburn, nausea and vomiting.  Genitourinary:  Negative for dysuria and frequency.  Musculoskeletal:  Negative for back pain and joint pain.       Restless legs  Skin:  Negative for itching and rash.  Neurological:  Negative for tingling, sensory change and headaches.  Psychiatric/Behavioral:  Negative for depression, hallucinations and suicidal ideas. The patient is not nervous/anxious.       Objective:     BP (!) 106/59 (BP Location: Left Arm, Patient Position: Sitting, Cuff Size: Normal)   Pulse 83   Temp 98.3 F (36.8 C) (Oral)   Ht 5\' 3"  (1.6 m)   Wt 122 lb (55.3 kg)   SpO2 98%   BMI 21.61 kg/m     Physical Exam Constitutional:      Appearance: Normal appearance.  HENT:     Right Ear: Tympanic membrane, ear canal and external ear normal.     Left Ear: Tympanic  membrane, ear canal and external ear normal.     Nose: Nose normal.     Mouth/Throat:     Mouth: Mucous membranes are moist.     Pharynx: Oropharynx is clear.  Eyes:     Conjunctiva/sclera: Conjunctivae normal.  Cardiovascular:     Rate and Rhythm: Normal rate and regular rhythm.  Pulmonary:     Effort: Pulmonary effort is normal.     Breath sounds: Normal breath sounds.  Abdominal:     General: Bowel sounds are normal.     Palpations: Abdomen is soft.  Skin:    Capillary Refill: Capillary refill takes less than 2 seconds.     Comments: Left forearm fistula site with areas of irritation and erythema  Neurological:     Mental Status: He is alert. Mental status is at baseline.  No results found for any visits on 05/06/23.      The ASCVD Risk score (Arnett DK, et al., 2019) failed to calculate for the following reasons:   The 2019 ASCVD risk score is only valid for ages 77 to 74   The patient has a prior MI or stroke diagnosis    Assessment & Plan:   Problem List Items Addressed This Visit       Cardiovascular and Mediastinum   HTN (hypertension)    At goal no change in medicines        Endocrine   Proliferative diabetic retinopathy associated with type 1 diabetes mellitus (HCC)    Follows with ophthalmology      Type 1 diabetes (HCC)    At goal care per endocrinology      Secondary hyperparathyroidism of renal origin (HCC)    Stable at this time        Genitourinary   ESRD (end stage renal disease) (HCC)    Care per nephrology      Anemia in chronic kidney disease    Stable at this time        Other   Restless legs - Primary    Unsure if this is restless leg syndrome will inquire with nephrology to see if this is related to his renal situation  If it is not then we will refer him to sleep medicine      Return in about 1 month (around 06/06/2023) for diabetes, htn, chronic conditions, renal failure.    Shan Levans, MD

## 2023-05-06 NOTE — Assessment & Plan Note (Signed)
At goal no change in medicines

## 2023-05-06 NOTE — Patient Instructions (Addendum)
No change in medications Return 1 year Keep kidney and diabetes and eye followups Awesome job on your health engagement and [place]! Give dr Delford Field a mychart msg about your kidney doctor thoughts on restless legs if not cause then will refer you to sleep medicine

## 2023-05-06 NOTE — Assessment & Plan Note (Signed)
Unsure if this is restless leg syndrome will inquire with nephrology to see if this is related to his renal situation  If it is not then we will refer him to sleep medicine

## 2023-05-06 NOTE — Assessment & Plan Note (Signed)
Follows with ophthalmology

## 2023-05-06 NOTE — Assessment & Plan Note (Signed)
Care per nephrology 

## 2023-05-06 NOTE — Assessment & Plan Note (Signed)
At goal care per endocrinology

## 2023-06-08 NOTE — Telephone Encounter (Signed)
complete

## 2023-06-17 ENCOUNTER — Ambulatory Visit: Payer: Medicare Other | Admitting: Critical Care Medicine

## 2023-06-23 NOTE — Patient Instructions (Incomplete)
[name] , Thank you for taking time to come for your Medicare Wellness Visit. I appreciate your ongoing commitment to your health goals. Please review the following plan we discussed and let me know if I can assist you in the future.   These are the goals we discussed:  Goals   None     This is a list of the screening recommended for you and due dates:  Health Maintenance  Topic Date Due   Medicare Annual Wellness Visit  Never done   COVID-19 Vaccine (5 - 2023-24 season) 08/29/2022   Eye exam for diabetics  11/12/2022   Hemoglobin A1C  07/08/2023   Flu Shot  07/30/2023   Complete foot exam   05/05/2024   DTaP/Tdap/Td vaccine (3 - Td or Tdap) 07/17/2029   Hepatitis C Screening  Completed   HIV Screening  Completed   HPV Vaccine  Aged Out    Advanced directives: Information on Advanced Care Planning can be found at [facility] of [facility] Advance Health Care Directives Advance Health Care Directives ([email or web address] Please bring a copy of your health care power of attorney and living will to the office to be added to your chart at your convenience.  Conditions/risks identified: Aim for 30 minutes of exercise or brisk walking, 6-8 glasses of water, and 5 servings of fruits and vegetables each day.  Next appointment: Follow up in one year for your annual wellness visit   Preventive Care 57-64 Years Old, Male Preventive care refers to lifestyle choices and visits with your health care provider that can promote health and wellness. Preventive care visits are also called wellness exams. What can I expect for my preventive care visit? Counseling During your preventive care visit, your health care provider may ask about your: Medical history, including: Past medical problems. Family medical history. Current health, including: Emotional well-being. Home life and relationship well-being. Sexual activity. Lifestyle, including: Alcohol, nicotine or tobacco, and drug  use. Access to firearms. Diet, exercise, and sleep habits. Safety issues such as seatbelt and bike helmet use. Sunscreen use. Work and work Astronomer. Physical exam Your health care provider may check your: Height and weight. These may be used to calculate your BMI (body mass index). BMI is a measurement that tells if you are at a healthy weight. Waist circumference. This measures the distance around your waistline. This measurement also tells if you are at a healthy weight and may help predict your risk of certain diseases, such as type 2 diabetes and high blood pressure. Heart rate and blood pressure. Body temperature. Skin for abnormal spots. What immunizations do I need? Vaccines are usually given at various ages, according to a schedule. Your health care provider will recommend vaccines for you based on your age, medical history, and lifestyle or other factors, such as travel or where you work. What tests do I need? Screening Your health care provider may recommend screening tests for certain conditions. This may include: Lipid and cholesterol levels. Diabetes screening. This is done by checking your blood sugar (glucose) after you have not eaten for a while (fasting). Hepatitis B test. Hepatitis C test. HIV (human immunodeficiency virus) test. STI (sexually transmitted infection) testing, if you are at risk. Talk with your health care provider about your test results, treatment options, and if necessary, the need for more tests. Follow these instructions at home: Eating and drinking  Eat a healthy diet that includes fresh fruits and vegetables, whole grains, lean protein, and low-fat dairy products.  Drink enough fluid to keep your urine pale yellow. Take vitamin and mineral supplements as recommended by your health care provider. Do not drink alcohol if your health care provider tells you not to drink. If you drink alcohol: Limit how much you have to 0-2 drinks a day. Know  how much alcohol is in your drink. In the [place] one drink equals one 12 oz bottle of beer (355 mL), one 5 oz glass of wine (148 mL), or one 1 oz glass of hard liquor (44 mL). Lifestyle Brush your teeth every morning and night with fluoride toothpaste. Floss one time each day. Exercise for at least 30 minutes 5 or more days each week. Do not use any products that contain nicotine or tobacco. These products include cigarettes, chewing tobacco, and vaping devices, such as e-cigarettes. If you need help quitting, ask your health care provider. Do not use drugs. If you are sexually active, practice safe sex. Use a condom or other form of protection to prevent STIs. Find healthy ways to manage stress, such as: Meditation, yoga, or listening to music. Journaling. Talking to a trusted person. Spending time with friends and family. Minimize exposure to UV radiation to reduce your risk of skin cancer. Safety Always wear your seat belt while driving or riding in a vehicle. Do not drive: If you have been drinking alcohol. Do not ride with someone who has been drinking. If you have been using any mind-altering substances or drugs. While texting. When you are tired or distracted. Wear a helmet and other protective equipment during sports activities. If you have firearms in your house, make sure you follow all gun safety procedures. Seek help if you have been physically or sexually abused. What's next? Go to your health care provider once a year for an annual wellness visit. Ask your health care provider how often you should have your eyes and teeth checked. Stay up to date on all vaccines. This information is not intended to replace advice given to you by your health care provider. Make sure you discuss any questions you have with your health care provider. Document Revised: 06/12/2021 Document Reviewed: 06/12/2021 Elsevier Patient Education  2022 [employer].

## 2023-06-23 NOTE — Progress Notes (Unsigned)
Subjective:   George [name] is a 37 y.o. male who presents for an Initial Medicare Annual Wellness Visit.  Visit Complete: {VISITMETHOD:[phone]}  Patient Medicare AWV questionnaire was completed by the patient on ***; I have confirmed that all information answered by patient is correct and no changes since this date.  Review of Systems    ***       Objective:    There were no vitals filed for this visit. There is no height or weight on file to calculate BMI.     03/15/2021    9:46 AM 09/26/2019    2:00 AM 06/20/2019   10:50 AM 06/14/2019    8:00 AM 06/12/2019    9:38 PM  Advanced Directives  Does Patient Have a Medical Advance Directive? No No No No No  Would patient like information on creating a medical advance directive? No - Patient declined No - Patient declined No - Patient declined No - Patient declined No - Patient declined    Current Medications (verified) Outpatient Encounter Medications as of 06/24/2023  Medication Sig   amLODipine (NORVASC) 10 MG tablet Take 1 tablet (10 mg total) by mouth daily.   AURYXIA 1 GM 210 MG(Fe) tablet Take 1 tablet (210 mg total) by mouth 3 (three) times daily.   carvedilol (COREG) 25 MG tablet Take 1 tablet (25 mg total) by mouth 2 (two) times daily with a meal.   Cholecalciferol (VITAMIN D3) 50 MCG (2000 UT) TABS Take 200 Units by mouth daily.   clopidogrel (PLAVIX) 75 MG tablet Take 1 tablet (75 mg total) by mouth daily.   Continuous Blood Gluc Receiver (DEXCOM G6 RECEIVER) DEVI 1 each by Does not apply route daily as needed.   Continuous Blood Gluc Sensor (DEXCOM G7 SENSOR) MISC Use to measure blood sugar   Continuous Blood Gluc Transmit (DEXCOM G6 TRANSMITTER) MISC Use as directed daily as needed.   glucose blood (ONETOUCH VERIO) test strip Use as instructed to check blood sugar 3 times daily.   hydrALAZINE (APRESOLINE) 100 MG tablet Take 1 tablet (100 mg total) by mouth 3 (three) times daily.   insulin glargine (LANTUS  SOLOSTAR) 100 UNIT/ML Solostar Pen Inject 16 Units into the skin daily.   Insulin Pen Needle (PEN NEEDLES 3/16") 31G X 5 MM MISC To inject insulin with novolog flex pen, 3 times daily with meals.   NOVOLOG FLEXPEN 100 UNIT/ML FlexPen Inject 5-30 Units into the skin See admin instructions. Mac daily 30 units to include correction scale and snack coverage three times a day with meals   pantoprazole (PROTONIX) 40 MG tablet Take 1 tablet (40 mg total) by mouth daily. For stomach protection   polyvinyl alcohol (LIQUIFILM TEARS) 1.4 % ophthalmic solution 1 drop as needed for dry eyes.   promethazine (PHENERGAN) 25 MG tablet Take 25 mg by mouth 3 (three) times daily as needed.   rosuvastatin (CRESTOR) 20 MG tablet Take 1 tablet (20 mg total) by mouth daily.   No facility-administered encounter medications on file as of 06/24/2023.    Allergies (verified) Bee pollen   History: 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/19/2019   Hypertension    Hyponatremia 09/30/2021   Neurologic deficit due to acute ischemic cerebrovascular accident (CVA) (HCC) 06/13/2019   Supraventricular tachycardia    Type 1 diabetes (HCC)  Past Surgical History:  Procedure Laterality Date   AV FISTULA PLACEMENT Left 03/15/2021   Procedure: LEFT UPPER EXTREMITY ARTERIOVENOUS (AV) FISTULA CREATION;  Surgeon: Larina Earthly, MD;  Location: [facility] OR;  Service: Vascular;  Laterality: Left;   BUBBLE STUDY  06/20/2019   Procedure: BUBBLE STUDY;  Surgeon: Pricilla Riffle, MD;  Location: [facility] ENDOSCOPY;  Service: Cardiovascular;;   TEE WITHOUT CARDIOVERSION N/A 06/20/2019   Procedure: TRANSESOPHAGEAL ECHOCARDIOGRAM (TEE);  Surgeon: Pricilla Riffle, MD;  Location: [facility] ENDOSCOPY;  Service: Cardiovascular;  Laterality: N/A;   Family History  Problem Relation Age of Onset   Hypertension  Mother    Diabetes Mother    Hyperlipidemia Mother    Diabetes Father    Social History   Socioeconomic History   Marital status: Single    Spouse name: Not on file   Number of children: Not on file   Years of education: Not on file   Highest education level: Not on file  Occupational History   Not on file  Tobacco Use   Smoking status: Former    Packs/day: .25    Types: Cigarettes    Start date: 12/29/2010    Quit date: 06/25/2020    Years since quitting: 2.9   Smokeless tobacco: Never  Vaping Use   Vaping Use: Former   Start date: 12/30/2015   Quit date: 12/29/2017  Substance and Sexual Activity   Alcohol use: Not on file    Comment: 6 beers/week   Drug use: Not on file   Sexual activity: Not Currently  Other Topics Concern   Not on file  Social History Narrative   Not on file   Social Determinants of Health   Financial Resource Strain: Not on file  Food Insecurity: Not on file  Transportation Needs: Not on file  Physical Activity: Not on file  Stress: Not on file  Social Connections: Not on file    Tobacco Counseling Counseling given: Not Answered   Clinical Intake:                        Activities of Daily Living     No data to display          Patient Care Team: Storm Frisk, MD as PCP - General (Pulmonary Disease) Debbe Odea, MD as PCP - Cardiology (Cardiology)  Indicate any recent Medical Services you may have received from other than [facility] providers in the past year (date may be approximate).     Assessment:   This is a routine wellness examination for George [name].  Hearing/Vision screen No results found.  Dietary issues and exercise activities discussed:     Goals Addressed   None    Depression Screen    05/06/2023    9:44 AM 01/07/2023   10:21 AM 08/06/2022   10:16 AM 03/05/2022   10:22 AM 10/23/2021    9:46 AM 10/10/2021    9:36 AM 09/09/2021   10:31 AM  PHQ 2/9 Scores  PHQ - 2 Score 1 0 0 2 0 0 0  PHQ- 9  Score 2 0 1 4       Fall Risk    05/06/2023    9:40 AM 01/07/2023   10:12 AM 03/05/2022   10:22 AM 10/23/2021    9:46 AM 10/10/2021    9:36 AM  Fall Risk   Falls in the past year? 0 0 0 0 0  Number falls in past yr: 0 0  0 0 0  Injury with Fall? 0 0 0 0 0  Risk for fall due to : No Fall Risks No Fall Risks No Fall Risks No Fall Risks No Fall Risks    MEDICARE RISK AT HOME:   TIMED UP AND GO:  Was the test performed? No    Cognitive Function:        Immunizations Immunization History  Administered Date(s) Administered   DTP 06/01/1991   HIB (PRP-OMP) 06/01/1991   Hepatitis B 10/05/1997, 11/16/1997, 04/24/1998   Influenza, Quadrivalent, Recombinant, Inj, Pf 10/18/2022   Influenza,inj,Quad PF,6+ Mos 09/19/2019, 01/08/2021, 09/09/2021   Influenza-Unspecified 01/08/2021, 09/09/2021   OPV 06/01/1991   PFIZER Comirnaty(Gray Top)Covid-19 Tri-Sucrose Vaccine 06/13/2021   PFIZER(Purple Top)SARS-COV-2 Vaccination 03/20/2020, 04/10/2020, 12/10/2020   PNEUMOCOCCAL CONJUGATE-20 09/09/2021   Pneumococcal Polysaccharide-23 07/18/2019   Tdap 07/18/2019    TDAP status: Up to date  Pneumococcal vaccine status: Up to date  Covid-19 vaccine status: Information provided on how to obtain vaccines.   Qualifies for Shingles Vaccine? No    Screening Tests Health Maintenance  Topic Date Due   Medicare Annual Wellness (AWV)  Never done   COVID-19 Vaccine (5 - 2023-24 season) 08/29/2022   OPHTHALMOLOGY EXAM  11/12/2022   HEMOGLOBIN A1C  07/08/2023   INFLUENZA VACCINE  07/30/2023   FOOT EXAM  05/05/2024   DTaP/Tdap/Td (3 - Td or Tdap) 07/17/2029   Hepatitis C Screening  Completed   HIV Screening  Completed   HPV VACCINES  Aged Out    Health Maintenance  Health Maintenance Due  Topic Date Due   Medicare Annual Wellness (AWV)  Never done   COVID-19 Vaccine (5 - 2023-24 season) 08/29/2022   OPHTHALMOLOGY EXAM  11/12/2022    Lung Cancer Screening: (Low Dose CT Chest recommended  if Age 25-80 years, 20 pack-year currently smoking OR have quit w/in 15years.) does not qualify.   Lung Cancer Screening Referral: n/a  Additional Screening:  Hepatitis C Screening: does qualify; Completed 07/25/20  Vision Screening: Recommended annual ophthalmology exams for early detection of glaucoma and other disorders of the eye. Is the patient up to date with their annual eye exam?  {YES/NO:21197} Who is the provider or what is the name of the office in which the patient attends annual eye exams? *** If pt is not established with a provider, would they like to be referred to a provider to establish care? {YES/NO:21197}.   Dental Screening: Recommended annual dental exams for proper oral hygiene  Diabetic Foot Exam: {Diabetic Foot Exam:[id number]}  Community Resource Referral / Chronic Care Management: CRR required this visit?  {YES/NO:21197}  CCM required this visit?  {CCM Required choices:[phone]}    Plan:     I have personally reviewed and noted the following in the patient's chart:   Medical and social history Use of alcohol, tobacco or illicit drugs  Current medications and supplements including opioid prescriptions. {Opioid Prescriptions:[phone]} Functional ability and status Nutritional status Physical activity Advanced directives List of other physicians Hospitalizations, surgeries, and ER visits in previous 12 months Vitals Screenings to include cognitive, depression, and falls Referrals and appointments  In addition, I have reviewed and discussed with patient certain preventive protocols, quality metrics, and best practice recommendations. A written personalized care plan for preventive services as well as general preventive health recommendations were provided to patient.     Kandis Fantasia [place], [place]   1/61/0960   After Visit Summary: {CHL AMB AWV After Visit [phone]}  Nurse Notes: ***

## 2023-06-24 ENCOUNTER — Ambulatory Visit: Payer: Medicare Other | Attending: Critical Care Medicine

## 2023-06-24 VITALS — Ht 63.0 in | Wt 122.0 lb

## 2023-06-24 DIAGNOSIS — Z0000 Encounter for general adult medical examination without abnormal findings: Secondary | ICD-10-CM

## 2023-08-13 ENCOUNTER — Other Ambulatory Visit: Payer: Self-pay | Admitting: Critical Care Medicine

## 2023-08-13 DIAGNOSIS — Z7982 Long term (current) use of aspirin: Secondary | ICD-10-CM

## 2023-09-02 ENCOUNTER — Encounter: Payer: Self-pay | Admitting: Critical Care Medicine

## 2023-09-02 ENCOUNTER — Ambulatory Visit: Payer: Medicare Other | Attending: Critical Care Medicine | Admitting: Critical Care Medicine

## 2023-09-02 VITALS — BP 99/62 | HR 82 | Ht 63.0 in | Wt 119.0 lb

## 2023-09-02 DIAGNOSIS — E785 Hyperlipidemia, unspecified: Secondary | ICD-10-CM | POA: Diagnosis not present

## 2023-09-02 DIAGNOSIS — N2581 Secondary hyperparathyroidism of renal origin: Secondary | ICD-10-CM | POA: Diagnosis not present

## 2023-09-02 DIAGNOSIS — Z8349 Family history of other endocrine, nutritional and metabolic diseases: Secondary | ICD-10-CM | POA: Insufficient documentation

## 2023-09-02 DIAGNOSIS — E1065 Type 1 diabetes mellitus with hyperglycemia: Secondary | ICD-10-CM

## 2023-09-02 DIAGNOSIS — I10 Essential (primary) hypertension: Secondary | ICD-10-CM

## 2023-09-02 DIAGNOSIS — Z83438 Family history of other disorder of lipoprotein metabolism and other lipidemia: Secondary | ICD-10-CM | POA: Diagnosis not present

## 2023-09-02 DIAGNOSIS — Z7982 Long term (current) use of aspirin: Secondary | ICD-10-CM

## 2023-09-02 DIAGNOSIS — Z8673 Personal history of transient ischemic attack (TIA), and cerebral infarction without residual deficits: Secondary | ICD-10-CM | POA: Diagnosis not present

## 2023-09-02 DIAGNOSIS — N186 End stage renal disease: Secondary | ICD-10-CM | POA: Diagnosis not present

## 2023-09-02 DIAGNOSIS — I120 Hypertensive chronic kidney disease with stage 5 chronic kidney disease or end stage renal disease: Secondary | ICD-10-CM | POA: Insufficient documentation

## 2023-09-02 DIAGNOSIS — E1069 Type 1 diabetes mellitus with other specified complication: Secondary | ICD-10-CM

## 2023-09-02 DIAGNOSIS — E10319 Type 1 diabetes mellitus with unspecified diabetic retinopathy without macular edema: Secondary | ICD-10-CM | POA: Diagnosis not present

## 2023-09-02 DIAGNOSIS — E1022 Type 1 diabetes mellitus with diabetic chronic kidney disease: Secondary | ICD-10-CM | POA: Insufficient documentation

## 2023-09-02 LAB — POCT GLYCOSYLATED HEMOGLOBIN (HGB A1C): HbA1c, POC (controlled diabetic range): 6.9 % (ref 0.0–7.0)

## 2023-09-02 LAB — GLUCOSE, POCT (MANUAL RESULT ENTRY): POC Glucose: 145 mg/dL — AB (ref 70–99)

## 2023-09-02 MED ORDER — ROSUVASTATIN CALCIUM 20 MG PO TABS: 20.0000 mg | ORAL_TABLET | Freq: Every day | ORAL | 2 refills | Status: AC

## 2023-09-02 MED ORDER — NOVOLOG FLEXPEN 100 UNIT/ML ~~LOC~~ SOPN: PEN_INJECTOR | SUBCUTANEOUS | 2 refills | Status: DC

## 2023-09-02 MED ORDER — AMLODIPINE BESYLATE 10 MG PO TABS: 10.0000 mg | ORAL_TABLET | Freq: Every day | ORAL | 2 refills | Status: DC

## 2023-09-02 MED ORDER — HYDRALAZINE HCL 100 MG PO TABS
100.0000 mg | ORAL_TABLET | Freq: Two times a day (BID) | ORAL | 4 refills | Status: DC
Start: 2023-09-02 — End: 2024-10-17

## 2023-09-02 MED ORDER — PANTOPRAZOLE SODIUM 40 MG PO TBEC
40.0000 mg | DELAYED_RELEASE_TABLET | Freq: Every day | ORAL | 2 refills | Status: DC
Start: 2023-09-02 — End: 2024-11-01

## 2023-09-02 MED ORDER — DEXCOM G6 TRANSMITTER MISC: 1.0000 | Freq: Every day | 4 refills | Status: DC | PRN

## 2023-09-02 MED ORDER — CARVEDILOL 25 MG PO TABS
25.0000 mg | ORAL_TABLET | Freq: Two times a day (BID) | ORAL | 1 refills | Status: DC
Start: 2023-09-02 — End: 2024-03-04

## 2023-09-02 MED ORDER — INSULIN GLARGINE-YFGN 100 UNIT/ML ~~LOC~~ SOPN: 16.0000 [IU] | PEN_INJECTOR | Freq: Every day | SUBCUTANEOUS | 5 refills | Status: DC

## 2023-09-02 MED ORDER — "PEN NEEDLES 3/16"" 31G X 5 MM MISC": 4 refills | Status: DC

## 2023-09-02 NOTE — Assessment & Plan Note (Addendum)
Hypertension well-controlled refills given he will reduce hydralazine to 100 mg twice daily hold on dialysis days

## 2023-09-02 NOTE — Assessment & Plan Note (Addendum)
No changes medications refer back to endocrinology  Check labs

## 2023-09-02 NOTE — Assessment & Plan Note (Signed)
As per nephrology 

## 2023-09-02 NOTE — Patient Instructions (Signed)
Reduce hydralazine to twice daily hold on dialysis days Call transplant center to get case reopened and labs Labs today here All medications refilled Ref to endocrinology here made Return Primary care 6 months

## 2023-09-02 NOTE — Progress Notes (Signed)
Established Patient Office Visit  Subjective   Patient ID: George [name], male    DOB: [birth year]  Age: 37 y.o. MRN: [id number]  Chief Complaint  Patient presents with   Diabetes    DM f/u.  Med refill - semglee  No questions / concerns Yes to flu vax.     07/2022 George [name] is a 37 year old male who presents today for follow up. He has history of end stage renal disease, type 1 diabetes, diabetic retinopathy, hypertension, previous cerebrovascular accidents, iron deficiency anemia, and tobacco use.   He is currently on dialysis three times weekly- Tuesday, Thursday and Saturdays- in [place]. He is awaiting transplant, completed testing in June. Notes some recent issues with bleeding at the site of his fistula, and says the dialysis center is aware of this. They had previously recommended he go to ER to get a suture but he waited it out and did not go. He is on plavix. He also has an aneurysm in that area as well and they are aware of that as well.  He is followed by endocrinology for his type 1 diabetes and is maintained on low dose short acting insulin along with 16 units of glargine daily. Today's hemoglobin A1C = 5.8%. He is being followed by opthalmology for his diabetic retinopathy.   Today's blood pressure is 114/70. Hypertension medications include: amlodipine 10 mg daily, carvediolol 25 mg BID, and hydralazine 100 mg TID. Patient is compliant with all medications. He is still smoking cigarettes, however significantly less than the past. Reports only a few each week.   He uses [facility] for his history of iron deficiency anemia. Reports he had to go without it for a few weeks due to insurance issues. However, he is back on it now consistently.   05/06/23 Patient seen in return follow-up for his type 1 diabetes last seen last summer but did see George [name] in January.  Patient overall doing well A1c is 5.8 he is on subcutaneous long and short acting insulin at this time and  does receive dialysis Tuesday Thursday Saturday.  He is on a transplant list and is waiting.  His largest complaint today is that of restless legs.  This is never been evaluated before.  It is uncertain if this is related to his renal situation.  09/02/23 This patient was seen in return follow-up he has type I diabetes end-stage renal disease on dialysis.  He was being assessed for insulin pump at his side ago with subcutaneous treatments.  He dialyzes Tuesday Thursday Saturday.  He was try to get on the transplant list but did not receive phone calls because he did not answer from the transplant center for pretransplant labs.  His average blood sugars about 114 can be as high as 200 lowest 75.  A1c today is 6.9 up from 5.8 previous.  He does have a CGM monitor.  He agrees to receive the flu vaccine he needs the high-dose we are out of that he will go to local pharmacy.  There are no other complaints.    Patient Active Problem List   Diagnosis Date Noted   Restless legs 05/06/2023   Other disorders of phosphorus metabolism 05/14/2022   Iron deficiency anemia, unspecified 01/02/2022   Personal history of nicotine dependence 01/02/2022   Secondary hyperparathyroidism of renal origin (HCC) 01/02/2022   Anemia in chronic kidney disease 01/02/2022   Personal history of transient ischemic attack (TIA), and cerebral infarction without residual deficits 01/02/2022  Aftercare including intermittent dialysis (HCC) 12/10/2021   Type 1 diabetes (HCC)    GERD (gastroesophageal reflux disease)    ESRD (end stage renal disease) (HCC)    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 (HCC) 02/07/2020   Proliferative diabetic retinopathy associated with type 1 diabetes mellitus (HCC) 12/19/2019   Hyperlipidemia due to type 1 diabetes mellitus (HCC) 06/27/2019   HTN (hypertension)    History of CVA (cerebrovascular accident) 06/13/2019   Past Medical History:  Diagnosis Date   AKI (acute kidney  injury) (HCC)    Cerebral infarction (HCC)    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) 09/19/2019   Hypertension    Hyponatremia 09/30/2021   Neurologic deficit due to acute ischemic cerebrovascular accident (CVA) (HCC) 06/13/2019   Supraventricular tachycardia    Type 1 diabetes (HCC)    Past Surgical History:  Procedure Laterality Date   AV FISTULA PLACEMENT Left 03/15/2021   Procedure: LEFT UPPER EXTREMITY ARTERIOVENOUS (AV) FISTULA CREATION;  Surgeon: George Earthly, MD;  Location: [facility] OR;  Service: Vascular;  Laterality: Left;   BUBBLE STUDY  06/20/2019   Procedure: BUBBLE STUDY;  Surgeon: George Riffle, MD;  Location: [facility] ENDOSCOPY;  Service: Cardiovascular;;   TEE WITHOUT CARDIOVERSION N/A 06/20/2019   Procedure: TRANSESOPHAGEAL ECHOCARDIOGRAM (TEE);  Surgeon: George Riffle, MD;  Location: [facility] ENDOSCOPY;  Service: Cardiovascular;  Laterality: N/A;   Social History   Tobacco Use   Smoking status: Former    Current packs/day: 0.00    Average packs/day: 0.3 packs/day for 9.5 years (2.4 ttl pk-yrs)    Types: Cigarettes    Start date: 12/29/2010    Quit date: 06/25/2020    Years since quitting: 3.1   Smokeless tobacco: Never  Vaping Use   Vaping status: Former   Start date: 12/30/2015   Quit date: 12/29/2017   Family History  Problem Relation Age of Onset   Hypertension Mother    Diabetes Mother    Hyperlipidemia Mother    Diabetes Father    Allergies  Allergen Reactions   Bee Pollen Other (See Comments)    Stuffy nose      Review of Systems  Constitutional:  Negative for chills, diaphoresis and fever.  HENT:  Negative for congestion, ear pain and sore throat.   Eyes: Negative.   Respiratory:  Negative for cough, shortness of breath and wheezing.   Cardiovascular:  Negative for chest pain and palpitations.  Gastrointestinal:  Negative for constipation, diarrhea, heartburn, nausea and vomiting.  Genitourinary:  Negative for  dysuria and frequency.  Musculoskeletal:  Negative for back pain and joint pain.       Restless legs  Skin:  Negative for itching and rash.  Neurological:  Negative for tingling, sensory change and headaches.  Psychiatric/Behavioral:  Negative for depression, hallucinations and suicidal ideas. The patient is not nervous/anxious.       Objective:     BP 99/62 (BP Location: Left Arm, Patient Position: Sitting, Cuff Size: Normal)   Pulse 82   Ht 5\' 3"  (1.6 m)   Wt 119 lb (54 kg)   SpO2 100%   BMI 21.08 kg/m     Physical Exam Constitutional:      Appearance: Normal appearance.  HENT:     Right Ear: Tympanic membrane, ear canal and external ear normal.     Left Ear: Tympanic membrane, ear canal and external ear normal.     Nose: Nose normal.  Mouth/Throat:     Mouth: Mucous membranes are moist.     Pharynx: Oropharynx is clear.  Eyes:     Conjunctiva/sclera: Conjunctivae normal.  Cardiovascular:     Rate and Rhythm: Normal rate and regular rhythm.  Pulmonary:     Effort: Pulmonary effort is normal.     Breath sounds: Normal breath sounds.  Abdominal:     General: Bowel sounds are normal.     Palpations: Abdomen is soft.  Skin:    Capillary Refill: Capillary refill takes less than 2 seconds.     Comments: Left forearm fistula site with areas of irritation and erythema  Neurological:     Mental Status: He is alert. Mental status is at baseline.      Results for orders placed or performed in visit on 09/02/23  HgB A1c  Result Value Ref Range   Hemoglobin A1C     HbA1c POC (<> result, manual entry)     HbA1c, POC (prediabetic range)     HbA1c, POC (controlled diabetic range) 6.9 0.0 - 7.0 %  POCT glucose (manual entry)  Result Value Ref Range   POC Glucose 145 (A) 70 - 99 mg/dl        The ASCVD Risk score (Arnett DK, et al., 2019) failed to calculate for the following reasons:   The 2019 ASCVD risk score is only valid for ages 30 to 28   The patient has a  prior MI or stroke diagnosis    Assessment & Plan:   Problem List Items Addressed This Visit       Cardiovascular and Mediastinum   HTN (hypertension)    Hypertension well-controlled refills given he will reduce hydralazine to 100 mg twice daily hold on dialysis days       Relevant Medications   amLODipine (NORVASC) 10 MG tablet   rosuvastatin (CRESTOR) 20 MG tablet   carvedilol (COREG) 25 MG tablet   hydrALAZINE (APRESOLINE) 100 MG tablet     Endocrine   Hyperlipidemia due to type 1 diabetes mellitus (HCC)    Continue statin      Relevant Medications   insulin glargine-yfgn (SEMGLEE, YFGN,) 100 UNIT/ML Pen   NOVOLOG FLEXPEN 100 UNIT/ML FlexPen   amLODipine (NORVASC) 10 MG tablet   rosuvastatin (CRESTOR) 20 MG tablet   carvedilol (COREG) 25 MG tablet   hydrALAZINE (APRESOLINE) 100 MG tablet   Other Relevant Orders   Lipid panel   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 (HCC)    Continue with retinal follow-up      Relevant Medications   insulin glargine-yfgn (SEMGLEE, YFGN,) 100 UNIT/ML Pen   NOVOLOG FLEXPEN 100 UNIT/ML FlexPen   rosuvastatin (CRESTOR) 20 MG tablet   Type 1 diabetes (HCC) - Primary    No changes medications refer back to endocrinology  Check labs      Relevant Medications   insulin glargine-yfgn (SEMGLEE, YFGN,) 100 UNIT/ML Pen   NOVOLOG FLEXPEN 100 UNIT/ML FlexPen   rosuvastatin (CRESTOR) 20 MG tablet   Insulin Pen Needle (PEN NEEDLES 3/16") 31G X 5 MM MISC   Continuous Glucose Transmitter (DEXCOM G6 TRANSMITTER) MISC   Other Relevant Orders   HgB A1c (Completed)   POCT glucose (manual entry) (Completed)   Ambulatory referral to Endocrinology   Comprehensive metabolic panel   Secondary hyperparathyroidism of renal origin [facility])    As per nephrology        Genitourinary   ESRD (end stage renal disease) (HCC)   Relevant  Medications   Continuous Glucose Transmitter (DEXCOM G6 TRANSMITTER) MISC     Other   History of CVA  (cerebrovascular accident)   Relevant Medications   rosuvastatin (CRESTOR) 20 MG tablet   Other Visit Diagnoses     Essential hypertension       Relevant Medications   amLODipine (NORVASC) 10 MG tablet   rosuvastatin (CRESTOR) 20 MG tablet   carvedilol (COREG) 25 MG tablet   hydrALAZINE (APRESOLINE) 100 MG tablet   Other Relevant Orders   CBC with Differential/Platelet   Long term current use of aspirin       Relevant Medications   pantoprazole (PROTONIX) 40 MG tablet       Return in about 6 months (around 03/01/2024) for diabetes, htn.    Shan Levans, MD

## 2023-09-02 NOTE — Assessment & Plan Note (Signed)
Continue statin. 

## 2023-09-02 NOTE — Assessment & Plan Note (Signed)
Continue with retinal follow-up

## 2023-09-03 LAB — LIPID PANEL
Chol/HDL Ratio: 2.8 ratio (ref 0.0–5.0)
Cholesterol, Total: 105 mg/dL (ref 100–199)
HDL: 38 mg/dL — ABNORMAL LOW (ref >39)
LDL Chol Calc (NIH): 44 mg/dL (ref 0–99)
Triglycerides: 131 mg/dL (ref 0–149)
VLDL Cholesterol Cal: 23 mg/dL (ref 5–40)

## 2023-09-03 LAB — COMPREHENSIVE METABOLIC PANEL
ALT: 19 IU/L (ref 0–44)
AST: 20 IU/L (ref 0–40)
Albumin: 5.2 g/dL — ABNORMAL HIGH (ref 4.1–5.1)
Alkaline Phosphatase: 104 IU/L (ref 44–121)
BUN/Creatinine Ratio: 3 — ABNORMAL LOW (ref 9–20)
BUN: 21 mg/dL — ABNORMAL HIGH (ref 6–20)
Bilirubin Total: 0.5 mg/dL (ref 0.0–1.2)
CO2: 30 mmol/L — ABNORMAL HIGH (ref 20–29)
Calcium: 10.3 mg/dL — ABNORMAL HIGH (ref 8.7–10.2)
Chloride: 91 mmol/L — ABNORMAL LOW (ref 96–106)
Creatinine, Ser: 6.55 mg/dL — ABNORMAL HIGH (ref 0.76–1.27)
Globulin, Total: 2 g/dL (ref 1.5–4.5)
Glucose: 130 mg/dL — ABNORMAL HIGH (ref 70–99)
Potassium: 4.1 mmol/L (ref 3.5–5.2)
Sodium: 139 mmol/L (ref 134–144)
Total Protein: 7.2 g/dL (ref 6.0–8.5)
eGFR: 10 mL/min/{1.73_m2} — ABNORMAL LOW (ref >59)

## 2023-09-03 LAB — CBC WITH DIFFERENTIAL/PLATELET
Basophils Absolute: 0.1 10*3/uL (ref 0.0–0.2)
Basos: 1 %
EOS (ABSOLUTE): 0.3 10*3/uL (ref 0.0–0.4)
Eos: 4 %
Hematocrit: 39.6 % (ref 37.5–51.0)
Hemoglobin: 13.7 g/dL (ref 13.0–17.7)
Immature Grans (Abs): 0 10*3/uL (ref 0.0–0.1)
Immature Granulocytes: 0 %
Lymphocytes Absolute: 1 10*3/uL (ref 0.7–3.1)
Lymphs: 15 %
MCH: 32.4 pg (ref 26.6–33.0)
MCHC: 34.6 g/dL (ref 31.5–35.7)
MCV: 94 fL (ref 79–97)
Monocytes Absolute: 0.6 10*3/uL (ref 0.1–0.9)
Monocytes: 8 %
Neutrophils Absolute: 4.8 10*3/uL (ref 1.4–7.0)
Neutrophils: 72 %
Platelets: 160 10*3/uL (ref 150–450)
RBC: 4.23 x10E6/uL (ref 4.14–5.80)
RDW: 12.3 % (ref 11.6–15.4)
WBC: 6.8 10*3/uL (ref 3.4–10.8)

## 2023-09-03 NOTE — Progress Notes (Signed)
Let pt know labs all normal except kidney failure which is controlled by dialysis cholesterol is at goal

## 2023-09-04 ENCOUNTER — Telehealth: Payer: Self-pay

## 2023-09-04 NOTE — Telephone Encounter (Signed)
-----   Message from Shan Levans sent at 09/03/2023  5:57 AM EDT ----- Let pt know labs all normal except kidney failure which is controlled by dialysis cholesterol is at goal

## 2023-09-04 NOTE — Telephone Encounter (Signed)
Pt was called and is aware of results, DOB was confirmed.  ?

## 2024-02-15 ENCOUNTER — Ambulatory Visit (HOSPITAL_COMMUNITY)
Admission: RE | Admit: 2024-02-15 | Discharge: 2024-02-15 | Disposition: A | Discharge status: Home / Self Care | Payer: Medicare Other | Source: Ambulatory Visit | Attending: Nephrology | Admitting: Nephrology

## 2024-02-15 ENCOUNTER — Encounter (HOSPITAL_COMMUNITY): Payer: Self-pay | Admitting: Nephrology

## 2024-02-15 ENCOUNTER — Encounter (HOSPITAL_COMMUNITY)
Admission: RE | Disposition: A | Discharge status: Home / Self Care | Payer: Self-pay | Source: Ambulatory Visit | Attending: Nephrology

## 2024-02-15 ENCOUNTER — Other Ambulatory Visit: Payer: Self-pay

## 2024-02-15 DIAGNOSIS — Y832 Surgical operation with anastomosis, bypass or graft as the cause of abnormal reaction of the patient, or of later complication, without mention of misadventure at the time of the procedure: Secondary | ICD-10-CM | POA: Diagnosis not present

## 2024-02-15 DIAGNOSIS — T82858A Stenosis of vascular prosthetic devices, implants and grafts, initial encounter: Secondary | ICD-10-CM | POA: Insufficient documentation

## 2024-02-15 DIAGNOSIS — N186 End stage renal disease: Secondary | ICD-10-CM | POA: Diagnosis not present

## 2024-02-15 DIAGNOSIS — Z794 Long term (current) use of insulin: Secondary | ICD-10-CM | POA: Insufficient documentation

## 2024-02-15 DIAGNOSIS — I120 Hypertensive chronic kidney disease with stage 5 chronic kidney disease or end stage renal disease: Secondary | ICD-10-CM | POA: Diagnosis not present

## 2024-02-15 DIAGNOSIS — Z79899 Other long term (current) drug therapy: Secondary | ICD-10-CM | POA: Diagnosis not present

## 2024-02-15 DIAGNOSIS — D631 Anemia in chronic kidney disease: Secondary | ICD-10-CM | POA: Diagnosis not present

## 2024-02-15 DIAGNOSIS — E1022 Type 1 diabetes mellitus with diabetic chronic kidney disease: Secondary | ICD-10-CM | POA: Diagnosis not present

## 2024-02-15 DIAGNOSIS — Z8673 Personal history of transient ischemic attack (TIA), and cerebral infarction without residual deficits: Secondary | ICD-10-CM | POA: Insufficient documentation

## 2024-02-15 DIAGNOSIS — Z87891 Personal history of nicotine dependence: Secondary | ICD-10-CM | POA: Insufficient documentation

## 2024-02-15 DIAGNOSIS — Z992 Dependence on renal dialysis: Secondary | ICD-10-CM | POA: Insufficient documentation

## 2024-02-15 HISTORY — PX: PERIPHERAL VASCULAR BALLOON ANGIOPLASTY: CATH118281

## 2024-02-15 HISTORY — PX: A/V FISTULAGRAM: CATH118298

## 2024-02-15 SURGERY — A/V FISTULAGRAM
Anesthesia: LOCAL

## 2024-02-15 MED ORDER — LIDOCAINE HCL (PF) 1 % IJ SOLN
INTRAMUSCULAR | Status: AC
  Filled 2024-02-15: qty 30

## 2024-02-15 MED ORDER — MIDAZOLAM HCL 2 MG/2ML IJ SOLN
INTRAMUSCULAR | Status: AC
  Filled 2024-02-15: qty 2

## 2024-02-15 MED ORDER — MIDAZOLAM HCL 2 MG/2ML IJ SOLN
INTRAMUSCULAR | Status: DC | PRN
  Administered 2024-02-15: 1 mg via INTRAVENOUS

## 2024-02-15 MED ORDER — HEPARIN (PORCINE) IN NACL 1000-0.9 UT/500ML-% IV SOLN
INTRAVENOUS | Status: DC | PRN
  Administered 2024-02-15: 500 mL

## 2024-02-15 MED ORDER — LIDOCAINE HCL (PF) 1 % IJ SOLN
INTRAMUSCULAR | Status: DC | PRN
  Administered 2024-02-15: 2 mL via INTRADERMAL

## 2024-02-15 MED ORDER — FENTANYL CITRATE (PF) 100 MCG/2ML IJ SOLN
INTRAMUSCULAR | Status: AC
  Filled 2024-02-15: qty 2

## 2024-02-15 MED ORDER — IODIXANOL 320 MG/ML IV SOLN
INTRAVENOUS | Status: DC | PRN
  Administered 2024-02-15: 12 mL via INTRAVENOUS

## 2024-02-15 MED ORDER — FENTANYL CITRATE (PF) 100 MCG/2ML IJ SOLN
INTRAMUSCULAR | Status: DC | PRN
  Administered 2024-02-15: 25 ug via INTRAVENOUS

## 2024-02-15 SURGICAL SUPPLY — 10 items
BAG SNAP BAND KOVER 36X36 (MISCELLANEOUS) ×2 IMPLANT
BALLN MUSTANG 8.0X40 75 (BALLOONS) ×2
BALLOON MUSTANG 8.0X40 75 (BALLOONS) IMPLANT
CATH BEACON 5 .035 65 KMP TIP (CATHETERS) IMPLANT
COVER DOME SNAP 22 D (MISCELLANEOUS) ×2 IMPLANT
GUIDEWIRE ANGLED .035 180CM (WIRE) IMPLANT
KIT MICROPUNCTURE NIT STIFF (SHEATH) IMPLANT
SHEATH PINNACLE R/O II 6F 4CM (SHEATH) IMPLANT
SYR MEDALLION 10ML (SYRINGE) IMPLANT
TRAY PV CATH (CUSTOM PROCEDURE TRAY) ×2 IMPLANT

## 2024-02-15 NOTE — H&P (Signed)
Chief Complaint: Decreased access flow HPI: 38 year old man with past medical history significant for hypertension, type 1 diabetes mellitus, history of CVA and end-stage renal disease on hemodialysis on a TTS schedule.  He is referred for concerns with decreased access flows of his left radiocephalic fistula that was created in March 2022.  He denies any constitutional complaints including fevers or chills and has been n.p.o. overnight as recommended.  He denies any chest pain or shortness of breath and is able to lay flat without problems.  We discussed the angiogram procedure and after weighing the risks and benefits, he consents to proceed.  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/19/2019   Hypertension    Hyponatremia 09/30/2021   Neurologic deficit due to acute ischemic cerebrovascular accident (CVA) (HCC) 06/13/2019   Supraventricular tachycardia    Type 1 diabetes (HCC)     Past Surgical History:  Procedure Laterality Date   AV FISTULA PLACEMENT Left 03/15/2021   Procedure: LEFT UPPER EXTREMITY ARTERIOVENOUS (AV) FISTULA CREATION;  Surgeon: Larina Earthly, MD;  Location: [facility] OR;  Service: Vascular;  Laterality: Left;   BUBBLE STUDY  06/20/2019   Procedure: BUBBLE STUDY;  Surgeon: Pricilla Riffle, MD;  Location: [facility] ENDOSCOPY;  Service: Cardiovascular;;   TEE WITHOUT CARDIOVERSION N/A 06/20/2019   Procedure: TRANSESOPHAGEAL ECHOCARDIOGRAM (TEE);  Surgeon: Pricilla Riffle, MD;  Location: [facility] ENDOSCOPY;  Service: Cardiovascular;  Laterality: N/A;    Family History  Problem Relation Age of Onset   Hypertension Mother    Diabetes Mother    Hyperlipidemia Mother    Diabetes Father    Social History:  reports that he quit smoking about 3 years ago. His smoking use included cigarettes. He started smoking about 13 years ago. He has a 2.4  pack-year smoking history. He has never used smokeless tobacco.  Drug: Marijuana. No history on file for alcohol use.  Allergies:  Allergies  Allergen Reactions   Bee Pollen Other (See Comments)    Stuffy nose    Medications Prior to Admission  Medication Sig Dispense Refill   amLODipine (NORVASC) 10 MG tablet Take 1 tablet (10 mg total) by mouth daily. 90 tablet 2   AURYXIA 1 GM 210 MG(Fe) tablet Take 1 tablet (210 mg total) by mouth 3 (three) times daily. 270 tablet 2   carvedilol (COREG) 25 MG tablet Take 1 tablet (25 mg total) by mouth 2 (two) times daily with a meal. 180 tablet 1   Cholecalciferol (VITAMIN D3) 50 MCG (2000 UT) TABS Take 2,000 Units by mouth daily.     clopidogrel (PLAVIX) 75 MG tablet Take 1 tablet (75 mg total) by mouth daily. 90 tablet 3   hydrALAZINE (APRESOLINE) 100 MG tablet Take 1 tablet (100 mg total) by mouth 2 (two) times daily. Hold dialysis day 90 tablet 4   insulin glargine-yfgn (SEMGLEE, YFGN,) 100 UNIT/ML Pen Inject 16 Units into the skin daily. 9 mL 5   NOVOLOG FLEXPEN 100 UNIT/ML FlexPen Inject 5-30 Units into the skin See admin instructions. Mac daily 30 units to include correction scale and snack coverage three times a day with meals (Patient taking differently: Inject into the skin See admin instructions. Inject 5-30 Units into the skin See admin instructions. Mac daily 30 units to include correction scale and snack coverage three-fourtimes a day with meals) 30 mL  2   pantoprazole (PROTONIX) 40 MG tablet Take 1 tablet (40 mg total) by mouth daily. 90 tablet 2   polyvinyl alcohol (LIQUIFILM TEARS) 1.4 % ophthalmic solution Place into both eyes as needed for dry eyes.     promethazine (PHENERGAN) 25 MG tablet Take 25 mg by mouth 3 (three) times daily as needed for nausea or vomiting.     Continuous Blood Gluc Receiver (DEXCOM G6 RECEIVER) DEVI 1 each by Does not apply route daily as needed. 1 each 0   Continuous Blood Gluc Sensor (DEXCOM G7 SENSOR) MISC  Use to measure blood sugar 3 each 6   Continuous Glucose Transmitter (DEXCOM G6 TRANSMITTER) MISC Use as directed daily as needed. 1 each 4   glucose blood (ONETOUCH VERIO) test strip Use as instructed to check blood sugar 3 times daily. 100 each 11   Insulin Pen Needle (PEN NEEDLES 3/16") 31G X 5 MM MISC To inject insulin with novolog flex pen, 3 times daily with meals. 100 each 4   rosuvastatin (CRESTOR) 20 MG tablet Take 1 tablet (20 mg total) by mouth daily. (Patient not taking: Reported on 02/12/2024) 90 tablet 2    No results found for this or any previous visit (from the past 48 hours). No results found.  Review of Systems  All other systems reviewed and are negative.   Blood pressure (!) 144/96, pulse 97, resp. rate 14, weight 56.7 kg, SpO2 100%. Physical Exam Vitals and nursing note reviewed.  Constitutional:      Appearance: Normal appearance. He is normal weight.  HENT:     Head: Normocephalic and atraumatic.     Right Ear: External ear normal.     Left Ear: External ear normal.     Nose: Nose normal.     Mouth/Throat:     Mouth: Mucous membranes are moist.     Pharynx: Oropharynx is clear.  Eyes:     Extraocular Movements: Extraocular movements intact.     Conjunctiva/sclera: Conjunctivae normal.  Cardiovascular:     Rate and Rhythm: Normal rate and regular rhythm.     Pulses: Normal pulses.     Heart sounds: Normal heart sounds.  Pulmonary:     Effort: Pulmonary effort is normal.     Breath sounds: Normal breath sounds.  Abdominal:     General: Abdomen is flat. Bowel sounds are normal.     Palpations: Abdomen is soft.  Musculoskeletal:     Cervical back: Normal range of motion and neck supple.     Right lower leg: No edema.     Left lower leg: No edema.     Comments: Well-developed left radiocephalic fistula with aneurysmal development.  Poor augmentation with pulsatile arterial anastomosis.  Collapses easily with arm elevation.  Skin:    General: Skin is  warm and dry.  Neurological:     Mental Status: He is alert.      Assessment/Plan 1.  Decreased access flows: Undertake fistulogram today to evaluate for etiology and offer management with angioplasty.  Procedure explained to patient and he consents to proceed after weighing risks and benefits. 2.  End-stage renal disease: Resume hemodialysis on TTS schedule after angiogram/angioplasty procedure today. 3.  Hypertension: Blood pressure marginally elevated, no antihypertensive agents taken this morning while NPO.  Monitor with moderate sedation. 4.  Anemia: Monitor hemoglobin/hematocrit while on ESA protocol at outpatient dialysis.  Dagoberto Ligas, MD 02/15/2024, 9:02 AM

## 2024-02-15 NOTE — Discharge Instructions (Addendum)
Okay to discharge home anytime after 10:15 AM as long as clinically stable.  General care instructions: - Do not drive or operate heavy machinery for 24hrs - Avoid making any important decisions for the remainder of the day. - You should be able to eat, drink, and resume your normal medications. - Avoid any strenuous activity for the remainder of the day. Potential complications: - Your hand is more cold or numb than usual. - You are bleeding at the site and it will not stop with direct pressure. If it was a declot expect some oozing at the site. Avoid extreme pressure to the site. - You have a change in the bruit and /or thrill in your fistula or graft. - You have a fever, swelling, see redness or feel heat at or near the puncture site. Medication instructions: - Continue routine medications unless otherwise instructed. 4.   Please have your sutures removed at your next scheduled dialysis treatment.

## 2024-02-15 NOTE — Op Note (Signed)
Patient presents for concerns of decreased arterial pressures and access flows in his left RCF which was created in March, 2022.    On the physical exam, the fistula is well developed with a aneurysmal cannulation zone and is hyperpulsatile at the inflow. The outflow thrill is poor and collapse is normal.  Summary:  1)      The patient had successful angioplasty (8 mm Mustang FE ~18 atm) of significant 70-80% stenosis of the inflow cephalic vein.  2)      Flows improved after outflow angioplasty. Patent aneurysmal body of the fistula, outflow median veins, axillary and left central veins. 3)      This left RCF remains amenable to future percutaneous interventions.  Description of procedure: The arm was prepped and draped in the usual sterile fashion. The left radio-cephalic fistula was cannulated (40981) with a 21G needle directed in a retrograde direction in venous limb of the fistula. A guidewire was inserted and exchanged for a 6 Fr sheath. Contrast [phone]) injection via the side port of the sheath was performed. The angiogram of the fistula ([id number]) showed a patent body of the fistula (well-developed and mildly aneurysmal) with upper arm outflow median veins, patent axillary and central veins. There was a 70%-80% inflow cephalic vein stenosis and patent arterial anastomosis seen on reflux angiogram.  The angled Glidewire was advanced and manipulated with significant difficulty traversing the tortuous inflow cephalic vein with stenosis until the tip of the wire was in the distal radial artery.  Arteriogram revealed a patent distal and proximal artery, patent arterial anastomosis with a flow-limiting 70-80% inflow cephalic vein stenosis. An 8 x 40 mm Mustang angioplasty balloon was then inserted over the guidewire and positioned at the inflow cephalic vein stenosis.   Venous angioplasty (21308) was carried out to 18 ATM with FULL effacement of the waist on the balloon at the cephalic inflow vein  lesion. The repeat angiogram showed 10% residual stenosis with no evidence of extravasation or dissection and rapid flows through the fistula circuit.  Hemostasis: A 3-0 ethilon purse string suture was placed at the cannulation site on removal of the sheath.  Sedation: 1mg  Versed, Fentanyl. Sedation time. 21 minutes  Contrast. 12 mL  Monitoring: Because of the patient's comorbid conditions and sedation during the procedure, continuous EKG monitoring and O2 saturation monitoring was performed throughout the procedure by the RN. There were no abnormal arrhythmias encountered.  Complications: None  Diagnoses: I87.1 Stricture of vein  N18.6 ESRD T82.858A Stricture of access  Procedure Coding:  [phone] Cannulation and angiogram of fistula, venous angioplasty (inflow cephalic vein)  O9629 Contrast  Recommendations:  1. Continue to cannulate the fistula with 15G needles.  2. Refer for problems with flows/swelling. 3. Remove the suture next treatment.   Discharge: The patient was discharged home in stable condition. The patient was given education regarding the care of the dialysis access AVF and specific instructions in case of any problems.

## 2024-03-04 ENCOUNTER — Encounter: Payer: Self-pay | Admitting: Internal Medicine

## 2024-03-04 ENCOUNTER — Ambulatory Visit: Payer: Medicare Other | Attending: Internal Medicine | Admitting: Internal Medicine

## 2024-03-04 VITALS — BP 128/74 | HR 82 | Temp 98.1°F | Ht 63.0 in | Wt 126.0 lb

## 2024-03-04 DIAGNOSIS — Z79899 Other long term (current) drug therapy: Secondary | ICD-10-CM | POA: Insufficient documentation

## 2024-03-04 DIAGNOSIS — N186 End stage renal disease: Secondary | ICD-10-CM | POA: Diagnosis not present

## 2024-03-04 DIAGNOSIS — Z8673 Personal history of transient ischemic attack (TIA), and cerebral infarction without residual deficits: Secondary | ICD-10-CM | POA: Diagnosis not present

## 2024-03-04 DIAGNOSIS — E1059 Type 1 diabetes mellitus with other circulatory complications: Secondary | ICD-10-CM | POA: Insufficient documentation

## 2024-03-04 DIAGNOSIS — Z87891 Personal history of nicotine dependence: Secondary | ICD-10-CM | POA: Diagnosis not present

## 2024-03-04 DIAGNOSIS — Z992 Dependence on renal dialysis: Secondary | ICD-10-CM | POA: Diagnosis not present

## 2024-03-04 DIAGNOSIS — D631 Anemia in chronic kidney disease: Secondary | ICD-10-CM | POA: Diagnosis not present

## 2024-03-04 DIAGNOSIS — I120 Hypertensive chronic kidney disease with stage 5 chronic kidney disease or end stage renal disease: Secondary | ICD-10-CM | POA: Insufficient documentation

## 2024-03-04 DIAGNOSIS — E103593 Type 1 diabetes mellitus with proliferative diabetic retinopathy without macular edema, bilateral: Secondary | ICD-10-CM | POA: Insufficient documentation

## 2024-03-04 DIAGNOSIS — E1022 Type 1 diabetes mellitus with diabetic chronic kidney disease: Secondary | ICD-10-CM | POA: Insufficient documentation

## 2024-03-04 DIAGNOSIS — Z794 Long term (current) use of insulin: Secondary | ICD-10-CM | POA: Diagnosis not present

## 2024-03-04 LAB — GLUCOSE, POCT (MANUAL RESULT ENTRY): POC Glucose: 170 mg/dL — AB (ref 70–99)

## 2024-03-04 LAB — POCT GLYCOSYLATED HEMOGLOBIN (HGB A1C): HbA1c, POC (controlled diabetic range): 6.6 % (ref 0.0–7.0)

## 2024-03-04 MED ORDER — CARVEDILOL 25 MG PO TABS: 25.0000 mg | ORAL_TABLET | Freq: Two times a day (BID) | ORAL | 1 refills | Status: DC

## 2024-03-04 MED ORDER — LANTUS SOLOSTAR 100 UNIT/ML ~~LOC~~ SOPN
16.0000 [IU] | PEN_INJECTOR | Freq: Every day | SUBCUTANEOUS | 11 refills | Status: DC
Start: 2024-03-04 — End: 2024-05-27

## 2024-03-04 NOTE — Progress Notes (Signed)
 Patient ID: George [name], male    DOB: [birth year]  MRN: [id number]  CC: Diabetes (DM & HTN f/u. /Pt was informed by insurance that Semglee would no longer be covered without PA- discuss possible alternative/Already received flu vax)   Subjective: George [name] is a 38 y.o. male who presents for chronic ds management.  Previous PCP was Dr. Delford Field who has since retired. His concerns today include:  Patient with history of DM type I with retinopathy, HTN, ESRD on HD, CVA, IDA, tobacco dependence  DM: Results for orders placed or performed in visit on 03/04/24  POCT glucose (manual entry)   Collection Time: 03/04/24 10:30 AM  Result Value Ref Range   POC Glucose 170 (A) 70 - 99 mg/dl  POCT glycosylated hemoglobin (Hb A1C)   Collection Time: 03/04/24 10:50 AM  Result Value Ref Range   Hemoglobin A1C     HbA1c POC (<> result, manual entry)     HbA1c, POC (prediabetic range)     HbA1c, POC (controlled diabetic range) 6.6 0.0 - 7.0 %  -Currently not plugged in with endocrinology On Semglee 16 units daily and Novolog SS starting on 4; giving 4-5 units on avg.  Semglee no longer preferred on his insurance -uses manual glucometer.  CGM not covered by his insurance Checks BS 3-4x/day. BS rarely over 200  ESRD/HTN: on HD for past 2 yrs; goes T/Th/Sat Taking BP meds as prescribed: Holds Hydralazine completely on HD days otherwise he takes it 100 mg twice a day, carvedilol 25 mg twice a day and Norvasc 10 mg daily HL:  taking Crestor 20 mg daily Hx of CVA:  states he was told to stop ASA when he started HD.  Confirms taking Plavix daily Anemia: H/H had nl to 13.7/39.6.  No longer taking Auryxia; off x 3 yrs  Patient Active Problem List   Diagnosis Date Noted   Restless legs 05/06/2023   Other disorders of phosphorus metabolism 05/14/2022   Iron deficiency anemia, unspecified 01/02/2022   Personal history of nicotine dependence 01/02/2022   Secondary hyperparathyroidism of renal  origin (HCC) 01/02/2022   Anemia in chronic kidney disease 01/02/2022   Personal history of transient ischemic attack (TIA), and cerebral infarction without residual deficits 01/02/2022   Aftercare including intermittent dialysis (HCC) 12/10/2021   Type 1 diabetes (HCC)    GERD (gastroesophageal reflux disease)    ESRD (end stage renal disease) (HCC)    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 (HCC) 02/07/2020   Proliferative diabetic retinopathy associated with type 1 diabetes mellitus (HCC) 12/19/2019   Hyperlipidemia due to type 1 diabetes mellitus (HCC) 06/27/2019   HTN (hypertension)    History of CVA (cerebrovascular accident) 06/13/2019     Current Outpatient Medications on File Prior to Visit  Medication Sig Dispense Refill   amLODipine (NORVASC) 10 MG tablet Take 1 tablet (10 mg total) by mouth daily. 90 tablet 2   Cholecalciferol (VITAMIN D3) 50 MCG (2000 UT) TABS Take 2,000 Units by mouth daily.     clopidogrel (PLAVIX) 75 MG tablet Take 1 tablet (75 mg total) by mouth daily. 90 tablet 3   Continuous Blood Gluc Receiver (DEXCOM G6 RECEIVER) DEVI 1 each by Does not apply route daily as needed. 1 each 0   Continuous Blood Gluc Sensor (DEXCOM G7 SENSOR) MISC Use to measure blood sugar 3 each 6   Continuous Glucose Transmitter (DEXCOM G6 TRANSMITTER) MISC Use as directed daily as needed. 1 each 4  glucose blood (ONETOUCH VERIO) test strip Use as instructed to check blood sugar 3 times daily. 100 each 11   hydrALAZINE (APRESOLINE) 100 MG tablet Take 1 tablet (100 mg total) by mouth 2 (two) times daily. Hold dialysis day 90 tablet 4   Insulin Pen Needle (PEN NEEDLES 3/16") 31G X 5 MM MISC To inject insulin with novolog flex pen, 3 times daily with meals. 100 each 4   NOVOLOG FLEXPEN 100 UNIT/ML FlexPen Inject 5-30 Units into the skin See admin instructions. Mac daily 30 units to include correction scale and snack coverage three times a day with meals (Patient taking  differently: Inject into the skin See admin instructions. Inject 5-30 Units into the skin See admin instructions. Mac daily 30 units to include correction scale and snack coverage three-fourtimes a day with meals) 30 mL 2   pantoprazole (PROTONIX) 40 MG tablet Take 1 tablet (40 mg total) by mouth daily. 90 tablet 2   polyvinyl alcohol (LIQUIFILM TEARS) 1.4 % ophthalmic solution Place into both eyes as needed for dry eyes.     promethazine (PHENERGAN) 25 MG tablet Take 25 mg by mouth 3 (three) times daily as needed for nausea or vomiting.     rosuvastatin (CRESTOR) 20 MG tablet Take 1 tablet (20 mg total) by mouth daily. 90 tablet 2   No current facility-administered medications on file prior to visit.    Allergies  Allergen Reactions   Bee Pollen Other (See Comments)    Stuffy nose    Social History   Socioeconomic History   Marital status: Single    Spouse name: Not on file   Number of children: Not on file   Years of education: Not on file   Highest education level: Not on file  Occupational History   Not on file  Tobacco Use   Smoking status: Former    Current packs/day: 0.00    Average packs/day: 0.3 packs/day for 9.5 years (2.4 ttl pk-yrs)    Types: Cigarettes    Start date: 12/29/2010    Quit date: 06/25/2020    Years since quitting: 3.6   Smokeless tobacco: Never  Vaping Use   Vaping status: Former   Start date: 12/30/2015   Quit date: 12/29/2017  Substance and Sexual Activity   Alcohol use: Not on file    Comment: 6 beers/week   Drug use: Not on file   Sexual activity: Not Currently  Other Topics Concern   Not on file  Social History Narrative   Not on file   Social Drivers of Health   Financial Resource Strain: Low Risk  (03/04/2024)   Overall Financial Resource Strain (CARDIA)    Difficulty of Paying Living Expenses: Not hard at all  Food Insecurity: No Food Insecurity (03/04/2024)   Hunger Vital Sign    Worried About Running Out of Food in the Last Year: Never  true    Ran Out of Food in the Last Year: Never true  Transportation Needs: No Transportation Needs (03/04/2024)   PRAPARE - Administrator, Civil Service (Medical): No    Lack of Transportation (Non-Medical): No  Physical Activity: Sufficiently Active (03/04/2024)   Exercise Vital Sign    Days of Exercise per Week: 3 days    Minutes of Exercise per Session: 60 min  Stress: No Stress Concern Present (03/04/2024)   [employer] of Occupational Health - Occupational Stress Questionnaire    Feeling of Stress : Not at all  Social Connections: Socially  Isolated (03/04/2024)   Social Connection and Isolation Panel [NHANES]    Frequency of Communication with Friends and Family: Three times a week    Frequency of Social Gatherings with Friends and Family: Once a week    Attends Religious Services: Never    Database administrator or Organizations: No    Attends Banker Meetings: Never    Marital Status: Never married  Intimate Partner Violence: Not At Risk (03/04/2024)   Humiliation, Afraid, Rape, and Kick questionnaire    Fear of Current or Ex-Partner: No    Emotionally Abused: No    Physically Abused: No    Sexually Abused: No    Family History  Problem Relation Age of Onset   Hypertension Mother    Diabetes Mother    Hyperlipidemia Mother    Diabetes Father     Past Surgical History:  Procedure Laterality Date   A/V FISTULAGRAM N/A 02/15/2024   Procedure: A/V Fistulagram;  Surgeon: Dagoberto Ligas, MD;  Location[facility] INVASIVE CV LAB;  Service: Cardiovascular;  Laterality: N/A;   AV FISTULA PLACEMENT Left 03/15/2021   Procedure: LEFT UPPER EXTREMITY ARTERIOVENOUS (AV) FISTULA CREATION;  Surgeon: Larina Earthly, MD;  Location: [facility] OR;  Service: Vascular;  Laterality: Left;   BUBBLE STUDY  06/20/2019   Procedure: BUBBLE STUDY;  Surgeon: Pricilla Riffle, MD;  Location: [facility] ENDOSCOPY;  Service: Cardiovascular;;   PERIPHERAL VASCULAR BALLOON ANGIOPLASTY  02/15/2024    Procedure: PERIPHERAL VASCULAR BALLOON ANGIOPLASTY;  Surgeon: Dagoberto Ligas, MD;  Location: [facility] INVASIVE CV LAB;  Service: Cardiovascular;;  70/80% inflow cephlic   TEE WITHOUT CARDIOVERSION N/A 06/20/2019   Procedure: TRANSESOPHAGEAL ECHOCARDIOGRAM (TEE);  Surgeon: Pricilla Riffle, MD;  Location: [facility] ENDOSCOPY;  Service: Cardiovascular;  Laterality: N/A;    ROS: Review of Systems Negative except as stated above  PHYSICAL EXAM: BP 128/74 (BP Location: Left Arm, Patient Position: Sitting, Cuff Size: Normal)   Pulse 82   Temp 98.1 F (36.7 C) (Oral)   Ht 5\' 3"  (1.6 m)   Wt 126 lb (57.2 kg)   SpO2 97%   BMI 22.32 kg/m   Physical Exam   General appearance - alert, well appearing, young to middle-age Caucasian male and in no distress Mental status - normal mood, behavior, speech, dress, motor activity, and thought processes Neck - supple, no significant adenopathy Chest - clear to auscultation, no wheezes, rales or rhonchi, symmetric air entry Heart - normal rate, regular rhythm, normal S1, S2, no murmurs, rubs, clicks or gallops Extremities - peripheral pulses normal, no pedal edema, no clubbing or cyanosis     Latest Ref Rng & Units 09/02/2023   10:23 AM 01/07/2023   10:49 AM 10/10/2021   10:34 AM  CMP  Glucose 70 - 99 mg/dL 161  096  91   BUN 6 - 20 mg/dL 21  22  46   Creatinine 0.76 - 1.27 mg/dL 0.45  4.09  8.11   Sodium 134 - 144 mmol/L 139  143  138   Potassium 3.5 - 5.2 mmol/L 4.1  3.7  4.4   Chloride 96 - 106 mmol/L 91  96  102   CO2 20 - 29 mmol/L 30  28  19    Calcium 8.7 - 10.2 mg/dL 91.4  9.3  9.3   Total Protein 6.0 - 8.5 g/dL 7.2  6.7  6.4   Total Bilirubin 0.0 - 1.2 mg/dL 0.5  0.4  0.4   Alkaline Phos 44 -  121 IU/L 104  73  94   AST 0 - 40 IU/L 20  18  20    ALT 0 - 44 IU/L 19  30  19     Lipid Panel     Component Value Date/Time   CHOL 105 09/02/2023 1023   TRIG 131 09/02/2023 1023   HDL 38 (L) 09/02/2023 1023   CHOLHDL 2.8 09/02/2023 1023   CHOLHDL 5.1  01/12/2020 0546   VLDL 43 (H) 01/12/2020 0546   LDLCALC 44 09/02/2023 1023    CBC    Component Value Date/Time   WBC 6.8 09/02/2023 1023   WBC 6.2 10/01/2021 0347   RBC 4.23 09/02/2023 1023   RBC 2.84 (L) 10/01/2021 0347   HGB 13.7 09/02/2023 1023   HCT 39.6 09/02/2023 1023   PLT 160 09/02/2023 1023   MCV 94 09/02/2023 1023   MCH 32.4 09/02/2023 1023   MCH 31.0 10/01/2021 0347   MCHC 34.6 09/02/2023 1023   MCHC 36.1 (H) 10/01/2021 0347   RDW 12.3 09/02/2023 1023   LYMPHSABS 1.0 09/02/2023 1023   MONOABS 0.8 09/29/2021 2331   EOSABS 0.3 09/02/2023 1023   BASOSABS 0.1 09/02/2023 1023    ASSESSMENT AND PLAN: 1. Type 1 diabetes mellitus with proliferative retinopathy of both eyes, macular edema presence unspecified, unspecified proliferative retinopathy type (HCC) (Primary) A1c is at goal.  We will change Semglee to Lantus insulin since this is the preferred long-acting insulin on his insurance plan.  Continue NovoLog sliding scale We will try get him established with an endocrinologist.  Encourage healthy eating habits. - POCT glycosylated hemoglobin (Hb A1C) - POCT glucose (manual entry) - Ambulatory referral to Endocrinology - insulin glargine (LANTUS SOLOSTAR) 100 UNIT/ML Solostar Pen; Inject 16 Units into the skin daily.  Dispense: 15 mL; Refill: 11  2. Hypertension associated with type 1 diabetes mellitus (HCC) At goal.  Continue carvedilol 25 mg daily, Norvasc 10 mg daily and hydralazine 100 mg twice a day - carvedilol (COREG) 25 MG tablet; Take 1 tablet (25 mg total) by mouth 2 (two) times daily with a meal.  Dispense: 180 tablet; Refill: 1  3. ESRD on hemodialysis [facility])  Patient was given the opportunity to ask questions.  Patient verbalized understanding of the plan and was able to repeat key elements of the plan.   This documentation was completed using Paediatric nurse.  Any transcriptional errors are unintentional.  Orders Placed This  Encounter  Procedures   Ambulatory referral to Endocrinology   POCT glycosylated hemoglobin (Hb A1C)   POCT glucose (manual entry)     Requested Prescriptions   Signed Prescriptions Disp Refills   insulin glargine (LANTUS SOLOSTAR) 100 UNIT/ML Solostar Pen 15 mL 11    Sig: Inject 16 Units into the skin daily.   carvedilol (COREG) 25 MG tablet 180 tablet 1    Sig: Take 1 tablet (25 mg total) by mouth 2 (two) times daily with a meal.    Return in about 4 months (around 07/04/2024).  Jonah Blue, MD, FACP

## 2024-03-23 ENCOUNTER — Other Ambulatory Visit: Payer: Self-pay | Admitting: Critical Care Medicine

## 2024-03-23 DIAGNOSIS — E103593 Type 1 diabetes mellitus with proliferative diabetic retinopathy without macular edema, bilateral: Secondary | ICD-10-CM

## 2024-03-26 ENCOUNTER — Other Ambulatory Visit: Payer: Self-pay | Admitting: Physician Assistant

## 2024-03-26 DIAGNOSIS — Z8673 Personal history of transient ischemic attack (TIA), and cerebral infarction without residual deficits: Secondary | ICD-10-CM

## 2024-05-26 ENCOUNTER — Inpatient Hospital Stay
Admission: EM | Admit: 2024-05-26 | Discharge: 2024-05-28 | DRG: 640 | Disposition: A | Discharge status: Home / Self Care | Attending: Internal Medicine | Admitting: Internal Medicine

## 2024-05-26 ENCOUNTER — Other Ambulatory Visit: Payer: Self-pay

## 2024-05-26 ENCOUNTER — Emergency Department

## 2024-05-26 DIAGNOSIS — D631 Anemia in chronic kidney disease: Secondary | ICD-10-CM | POA: Diagnosis present

## 2024-05-26 DIAGNOSIS — Z8249 Family history of ischemic heart disease and other diseases of the circulatory system: Secondary | ICD-10-CM

## 2024-05-26 DIAGNOSIS — R748 Abnormal levels of other serum enzymes: Secondary | ICD-10-CM

## 2024-05-26 DIAGNOSIS — K3184 Gastroparesis: Secondary | ICD-10-CM | POA: Insufficient documentation

## 2024-05-26 DIAGNOSIS — N2581 Secondary hyperparathyroidism of renal origin: Secondary | ICD-10-CM | POA: Diagnosis present

## 2024-05-26 DIAGNOSIS — E1043 Type 1 diabetes mellitus with diabetic autonomic (poly)neuropathy: Secondary | ICD-10-CM | POA: Diagnosis present

## 2024-05-26 DIAGNOSIS — I120 Hypertensive chronic kidney disease with stage 5 chronic kidney disease or end stage renal disease: Secondary | ICD-10-CM | POA: Diagnosis present

## 2024-05-26 DIAGNOSIS — E785 Hyperlipidemia, unspecified: Secondary | ICD-10-CM | POA: Diagnosis present

## 2024-05-26 DIAGNOSIS — Z7902 Long term (current) use of antithrombotics/antiplatelets: Secondary | ICD-10-CM

## 2024-05-26 DIAGNOSIS — Z87891 Personal history of nicotine dependence: Secondary | ICD-10-CM

## 2024-05-26 DIAGNOSIS — E860 Dehydration: Secondary | ICD-10-CM | POA: Diagnosis present

## 2024-05-26 DIAGNOSIS — R112 Nausea with vomiting, unspecified: Secondary | ICD-10-CM

## 2024-05-26 DIAGNOSIS — Z794 Long term (current) use of insulin: Secondary | ICD-10-CM

## 2024-05-26 DIAGNOSIS — I4719 Other supraventricular tachycardia: Secondary | ICD-10-CM | POA: Diagnosis present

## 2024-05-26 DIAGNOSIS — Z992 Dependence on renal dialysis: Secondary | ICD-10-CM

## 2024-05-26 DIAGNOSIS — I2510 Atherosclerotic heart disease of native coronary artery without angina pectoris: Secondary | ICD-10-CM | POA: Diagnosis present

## 2024-05-26 DIAGNOSIS — Z833 Family history of diabetes mellitus: Secondary | ICD-10-CM

## 2024-05-26 DIAGNOSIS — E876 Hypokalemia: Secondary | ICD-10-CM | POA: Diagnosis present

## 2024-05-26 DIAGNOSIS — E871 Hypo-osmolality and hyponatremia: Principal | ICD-10-CM

## 2024-05-26 DIAGNOSIS — E1022 Type 1 diabetes mellitus with diabetic chronic kidney disease: Secondary | ICD-10-CM | POA: Diagnosis present

## 2024-05-26 DIAGNOSIS — E861 Hypovolemia: Secondary | ICD-10-CM | POA: Diagnosis present

## 2024-05-26 DIAGNOSIS — Z79899 Other long term (current) drug therapy: Secondary | ICD-10-CM

## 2024-05-26 DIAGNOSIS — Z8673 Personal history of transient ischemic attack (TIA), and cerebral infarction without residual deficits: Secondary | ICD-10-CM

## 2024-05-26 DIAGNOSIS — N186 End stage renal disease: Secondary | ICD-10-CM

## 2024-05-26 DIAGNOSIS — Z83438 Family history of other disorder of lipoprotein metabolism and other lipidemia: Secondary | ICD-10-CM

## 2024-05-26 LAB — COMPREHENSIVE METABOLIC PANEL WITH GFR
ALT: 14 U/L (ref 0–44)
AST: 13 U/L — ABNORMAL LOW (ref 15–41)
Albumin: 4.2 g/dL (ref 3.5–5.0)
Alkaline Phosphatase: 90 U/L (ref 38–126)
Anion gap: 21 — ABNORMAL HIGH (ref 5–15)
BUN: 54 mg/dL — ABNORMAL HIGH (ref 6–20)
CO2: 24 mmol/L (ref 22–32)
Calcium: 8.4 mg/dL — ABNORMAL LOW (ref 8.9–10.3)
Chloride: 71 mmol/L — ABNORMAL LOW (ref 98–111)
Creatinine, Ser: 10.63 mg/dL — ABNORMAL HIGH (ref 0.61–1.24)
GFR, Estimated: 6 mL/min — ABNORMAL LOW (ref >60)
Glucose, Bld: 156 mg/dL — ABNORMAL HIGH (ref 70–99)
Potassium: 3.1 mmol/L — ABNORMAL LOW (ref 3.5–5.1)
Sodium: 116 mmol/L — CL (ref 135–145)
Total Bilirubin: 1.5 mg/dL — ABNORMAL HIGH (ref 0.0–1.2)
Total Protein: 6.5 g/dL (ref 6.5–8.1)

## 2024-05-26 LAB — URINALYSIS, ROUTINE W REFLEX MICROSCOPIC
Bacteria, UA: NONE SEEN
Bilirubin Urine: NEGATIVE
Glucose, UA: 50 mg/dL — AB
Hgb urine dipstick: NEGATIVE
Ketones, ur: NEGATIVE mg/dL
Leukocytes,Ua: NEGATIVE
Nitrite: NEGATIVE
Protein, ur: 100 mg/dL — AB
Specific Gravity, Urine: 1.008 (ref 1.005–1.030)
Squamous Epithelial / HPF: 0 /HPF (ref 0–5)
pH: 6 (ref 5.0–8.0)

## 2024-05-26 LAB — CBC
HCT: 31.3 % — ABNORMAL LOW (ref 39.0–52.0)
Hemoglobin: 11.7 g/dL — ABNORMAL LOW (ref 13.0–17.0)
MCH: 32.5 pg (ref 26.0–34.0)
MCHC: 37.4 g/dL — ABNORMAL HIGH (ref 30.0–36.0)
MCV: 86.9 fL (ref 80.0–100.0)
Platelets: 168 10*3/uL (ref 150–400)
RBC: 3.6 MIL/uL — ABNORMAL LOW (ref 4.22–5.81)
RDW: 12 % (ref 11.5–15.5)
WBC: 10.2 10*3/uL (ref 4.0–10.5)
nRBC: 0 % (ref 0.0–0.2)

## 2024-05-26 LAB — MAGNESIUM: Magnesium: 2.3 mg/dL (ref 1.7–2.4)

## 2024-05-26 LAB — SODIUM: Sodium: 121 mmol/L — ABNORMAL LOW (ref 135–145)

## 2024-05-26 LAB — CBG MONITORING, ED
Glucose-Capillary: 161 mg/dL — ABNORMAL HIGH (ref 70–99)
Glucose-Capillary: 183 mg/dL — ABNORMAL HIGH (ref 70–99)

## 2024-05-26 LAB — HEMOGLOBIN A1C
Hgb A1c MFr Bld: 5.1 % (ref 4.8–5.6)
Mean Plasma Glucose: 99.67 mg/dL

## 2024-05-26 LAB — GLUCOSE, CAPILLARY
Glucose-Capillary: 121 mg/dL — ABNORMAL HIGH (ref 70–99)
Glucose-Capillary: 235 mg/dL — ABNORMAL HIGH (ref 70–99)

## 2024-05-26 LAB — LIPASE, BLOOD: Lipase: 221 U/L — ABNORMAL HIGH (ref 11–51)

## 2024-05-26 MED ORDER — HEPARIN SODIUM (PORCINE) 1000 UNIT/ML DIALYSIS
1000.0000 [IU] | INTRAMUSCULAR | Status: DC | PRN
Start: 2024-05-26 — End: 2024-05-26

## 2024-05-26 MED ORDER — METOCLOPRAMIDE HCL 5 MG/ML IJ SOLN
5.0000 mg | Freq: Four times a day (QID) | INTRAMUSCULAR | Status: DC
  Administered 2024-05-26 – 2024-05-28 (×5): 5 mg via INTRAVENOUS
  Filled 2024-05-26 (×6): qty 2

## 2024-05-26 MED ORDER — INSULIN ASPART 100 UNIT/ML IJ SOLN
0.0000 [IU] | Freq: Three times a day (TID) | INTRAMUSCULAR | Status: DC
  Administered 2024-05-26: 1 [IU] via SUBCUTANEOUS
  Filled 2024-05-26: qty 1

## 2024-05-26 MED ORDER — PENTAFLUOROPROP-TETRAFLUOROETH EX AERO: 1.0000 | INHALATION_SPRAY | CUTANEOUS | Status: DC | PRN

## 2024-05-26 MED ORDER — ACETAMINOPHEN 650 MG RE SUPP: 650.0000 mg | Freq: Four times a day (QID) | RECTAL | Status: DC | PRN

## 2024-05-26 MED ORDER — POTASSIUM CHLORIDE CRYS ER 20 MEQ PO TBCR
40.0000 meq | EXTENDED_RELEASE_TABLET | Freq: Once | ORAL | Status: AC
  Administered 2024-05-26: 40 meq via ORAL
  Filled 2024-05-26: qty 2

## 2024-05-26 MED ORDER — PANTOPRAZOLE SODIUM 40 MG PO TBEC
40.0000 mg | DELAYED_RELEASE_TABLET | Freq: Every day | ORAL | Status: DC
  Administered 2024-05-26 – 2024-05-28 (×3): 40 mg via ORAL
  Filled 2024-05-26 (×3): qty 1

## 2024-05-26 MED ORDER — INSULIN ASPART 100 UNIT/ML IJ SOLN
0.0000 [IU] | Freq: Three times a day (TID) | INTRAMUSCULAR | Status: DC
  Administered 2024-05-26: 2 [IU] via SUBCUTANEOUS
  Administered 2024-05-27: 5 [IU] via SUBCUTANEOUS
  Administered 2024-05-27: 1 [IU] via SUBCUTANEOUS
  Administered 2024-05-27: 3 [IU] via SUBCUTANEOUS
  Filled 2024-05-26 (×4): qty 1

## 2024-05-26 MED ORDER — LIDOCAINE-PRILOCAINE 2.5-2.5 % EX CREA
1.0000 | TOPICAL_CREAM | CUTANEOUS | Status: DC | PRN
  Filled 2024-05-26: qty 5

## 2024-05-26 MED ORDER — HYDRALAZINE HCL 20 MG/ML IJ SOLN
5.0000 mg | Freq: Four times a day (QID) | INTRAMUSCULAR | Status: DC | PRN
  Administered 2024-05-27: 5 mg via INTRAVENOUS
  Filled 2024-05-26: qty 1

## 2024-05-26 MED ORDER — ROSUVASTATIN CALCIUM 10 MG PO TABS
20.0000 mg | ORAL_TABLET | Freq: Every day | ORAL | Status: DC
  Administered 2024-05-26 – 2024-05-28 (×3): 20 mg via ORAL
  Filled 2024-05-26 (×3): qty 2

## 2024-05-26 MED ORDER — METOCLOPRAMIDE HCL 5 MG/ML IJ SOLN
10.0000 mg | Freq: Once | INTRAMUSCULAR | Status: AC
  Administered 2024-05-26: 10 mg via INTRAVENOUS
  Filled 2024-05-26: qty 2

## 2024-05-26 MED ORDER — CHLORHEXIDINE GLUCONATE CLOTH 2 % EX PADS
6.0000 | MEDICATED_PAD | Freq: Every day | CUTANEOUS | Status: DC
  Administered 2024-05-27 – 2024-05-28 (×2): 6 via TOPICAL
  Filled 2024-05-26: qty 6

## 2024-05-26 MED ORDER — ONDANSETRON HCL 4 MG PO TABS
4.0000 mg | ORAL_TABLET | Freq: Four times a day (QID) | ORAL | Status: DC | PRN
  Administered 2024-05-26: 4 mg via ORAL
  Filled 2024-05-26: qty 1

## 2024-05-26 MED ORDER — SODIUM CHLORIDE 0.9 % IV BOLUS
250.0000 mL | Freq: Once | INTRAVENOUS | Status: AC
  Administered 2024-05-26: 250 mL via INTRAVENOUS

## 2024-05-26 MED ORDER — ACETAMINOPHEN 325 MG PO TABS: 650.0000 mg | ORAL_TABLET | Freq: Four times a day (QID) | ORAL | Status: DC | PRN

## 2024-05-26 MED ORDER — ONDANSETRON HCL 4 MG/2ML IJ SOLN
4.0000 mg | Freq: Once | INTRAMUSCULAR | Status: AC
  Administered 2024-05-26: 4 mg via INTRAVENOUS
  Filled 2024-05-26: qty 2

## 2024-05-26 MED ORDER — ONDANSETRON HCL 4 MG/2ML IJ SOLN
4.0000 mg | Freq: Four times a day (QID) | INTRAMUSCULAR | Status: DC | PRN
Start: 2024-05-26 — End: 2024-05-28
  Administered 2024-05-27: 4 mg via INTRAVENOUS
  Filled 2024-05-26: qty 2

## 2024-05-26 MED ORDER — CLOPIDOGREL BISULFATE 75 MG PO TABS
75.0000 mg | ORAL_TABLET | Freq: Every day | ORAL | Status: DC
  Administered 2024-05-26 – 2024-05-28 (×3): 75 mg via ORAL
  Filled 2024-05-26 (×3): qty 1

## 2024-05-26 MED ORDER — CARVEDILOL 6.25 MG PO TABS
6.2500 mg | ORAL_TABLET | Freq: Two times a day (BID) | ORAL | Status: DC
  Administered 2024-05-26 – 2024-05-27 (×3): 6.25 mg via ORAL
  Filled 2024-05-26 (×4): qty 1

## 2024-05-26 NOTE — ED Triage Notes (Signed)
 Pt to ED via POV from home. Pt reports N/V x4 days. Pt reports unsure if his NA+ is low or if he is in DKA. Pt ambulatory to triage. Pt endorses SOB and intermittent diarrhea. Pt reports type 1 DM and has not been checking CBG at home but has bene taking insulin .

## 2024-05-26 NOTE — Consult Note (Signed)
 [facility]  CONSULT NOTE    Date: 05/26/2024                  Patient Name:  George [name]  MRN: [id number]  DOB: 04-08-1986  Age / Sex: 38 y.o., male         PCP: Lawrance Presume, MD                 Service Requesting Consult: [facility]                 Reason for Consult: End stage renal disease on hemodialysis with hyponatremia            History of Present Illness: George [name] is a 38 y.o.  male with past medical conditions including hypertension, diabetes, CVA, chronic hyponatremia, gastroparesis, and end-stage renal disease on hemodialysis, who was admitted to [facility] on 05/26/2024 for Hyponatremia [E87.1] Elevated lipase [R74.8] Nausea and vomiting, unspecified vomiting type [R11.2]  Patient is an established dialysis patient who receives outpatient treatments at [facility] on a TTS schedule, supervised by [facility] physicians.  Does report receiving a treatment on Tuesday.  Patient states he has been experiencing nausea and vomiting since Sunday evening.  Denies any sick contacts.  Denies diarrhea.  States he does vomiting 20 minutes ago, dry heaves.  Has been unable to tolerate liquids as well.  Labs of concern include sodium 116, potassium 3.1, BUN 54, creatinine 10.63 with GFR 6, and hemoglobin 11.7.  Chest x-ray negative for acute findings.  Chest x-ray negative for acute findings.  Most recent outpatient sodium level 134 on May 6.  Medications: Outpatient medications: (Not in a hospital admission)   Current medications: Current Facility-Administered Medications  Medication Dose Route Frequency Provider Last Rate Last Admin   Chlorhexidine  Gluconate Cloth 2 % PADS 6 each  6 each Topical Q0600 Arminda Foglio, NP       hydrALAZINE  (APRESOLINE ) injection 5 mg  5 mg Intravenous Q6H PRN Antoniette Batty T, MD       insulin  aspart (novoLOG ) injection 0-6 Units  0-6 Units Subcutaneous TID WC [place], MD   1 Units at 05/26/24 1201    Current Outpatient Medications  Medication Sig Dispense Refill   amLODipine  (NORVASC ) 10 MG tablet Take 1 tablet (10 mg total) by mouth daily. 90 tablet 2   AURYXIA  1 GM 210 MG(Fe) tablet Take 420 mg by mouth 3 (three) times daily.     carvedilol  (COREG ) 25 MG tablet Take 1 tablet (25 mg total) by mouth 2 (two) times daily with a meal. 180 tablet 1   clopidogrel  (PLAVIX ) 75 MG tablet TAKE 1 TABLET(75 MG) BY MOUTH DAILY 90 tablet 3   hydrALAZINE  (APRESOLINE ) 100 MG tablet Take 1 tablet (100 mg total) by mouth 2 (two) times daily. Hold dialysis day 90 tablet 4   NOVOLOG  FLEXPEN 100 UNIT/ML FlexPen Inject 5-30 Units into the skin See admin instructions. Mac daily 30 units to include correction scale and snack coverage three times a day with meals (Patient taking differently: Inject into the skin See admin instructions. Inject 5-30 Units into the skin See admin instructions. Mac daily 30 units to include correction scale and snack coverage three-fourtimes a day with meals) 30 mL 2   pantoprazole  (PROTONIX ) 40 MG tablet Take 1 tablet (40 mg total) by mouth daily. 90 tablet 2   polyvinyl alcohol (LIQUIFILM TEARS) 1.4 % ophthalmic solution Place into both eyes as needed for dry eyes.  promethazine  (PHENERGAN ) 25 MG tablet Take 25 mg by mouth 3 (three) times daily as needed for nausea or vomiting.     rosuvastatin  (CRESTOR ) 20 MG tablet Take 1 tablet (20 mg total) by mouth daily. 90 tablet 2   SEMGLEE , YFGN, 100 UNIT/ML Pen Inject 16 Units into the skin daily.     Cholecalciferol (VITAMIN D3) 50 MCG (2000 UT) TABS Take 2,000 Units by mouth daily.     Continuous Blood Gluc Receiver (DEXCOM G6 RECEIVER) DEVI 1 each by Does not apply route daily as needed. 1 each 0   Continuous Blood Gluc Sensor (DEXCOM G7 SENSOR) MISC Use to measure blood sugar 3 each 6   Continuous Glucose Transmitter (DEXCOM G6 TRANSMITTER) MISC Use as directed daily as needed. 1 each 4   glucose blood (ONETOUCH VERIO) test strip Use  as instructed to check blood sugar 3 times daily. 100 each 11   insulin  glargine (LANTUS  SOLOSTAR) 100 UNIT/ML Solostar Pen Inject 16 Units into the skin daily. (Patient not taking: Reported on 05/26/2024) 15 mL 11   Insulin  Pen Needle (PEN NEEDLES 3/16") 31G X 5 MM MISC To inject insulin  with novolog  flex pen, 3 times daily with meals. 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/19/2019   Hypertension    Hyponatremia 09/30/2021   Neurologic deficit due to acute ischemic cerebrovascular accident (CVA) (HCC) 06/13/2019   Supraventricular tachycardia (HCC)    Type 1 diabetes (HCC)      Past Surgical History: Past Surgical History:  Procedure Laterality Date   A/V FISTULAGRAM N/A 02/15/2024   Procedure: A/V Fistulagram;  Surgeon: Melodie Spry, MD;  Location: [facility] INVASIVE CV LAB;  Service: Cardiovascular;  Laterality: N/A;   AV FISTULA PLACEMENT Left 03/15/2021   Procedure: LEFT UPPER EXTREMITY ARTERIOVENOUS (AV) FISTULA CREATION;  Surgeon: Mayo Speck, MD;  Location: [facility] OR;  Service: Vascular;  Laterality: Left;   BUBBLE STUDY  06/20/2019   Procedure: BUBBLE STUDY;  Surgeon: Elmyra Haggard, MD;  Location: [facility] ENDOSCOPY;  Service: Cardiovascular;;   PERIPHERAL VASCULAR BALLOON ANGIOPLASTY  02/15/2024   Procedure: PERIPHERAL VASCULAR BALLOON ANGIOPLASTY;  Surgeon: Melodie Spry, MD;  Location: [facility] INVASIVE CV LAB;  Service: Cardiovascular;;  70/80% inflow cephlic   TEE WITHOUT CARDIOVERSION N/A 06/20/2019   Procedure: TRANSESOPHAGEAL ECHOCARDIOGRAM (TEE);  Surgeon: Elmyra Haggard, MD;  Location: [facility] ENDOSCOPY;  Service: Cardiovascular;  Laterality: N/A;     Family History: Family History  Problem Relation  Age of Onset   Hypertension Mother    Diabetes Mother    Hyperlipidemia Mother    Diabetes Father      Social History: Social History   Socioeconomic History   Marital status: Single    Spouse name: Not on file   Number of children: Not on file   Years of education: Not on file   Highest education level: Not on file  Occupational History   Not on file  Tobacco Use   Smoking status: Former    Current packs/day: 0.00    Average packs/day: 0.3 packs/day for 9.5 years (2.4 ttl pk-yrs)    Types: Cigarettes    Start  date: 12/29/2010    Quit date: 06/25/2020    Years since quitting: 3.9   Smokeless tobacco: Never  Vaping Use   Vaping status: Former   Start date: 12/30/2015   Quit date: 12/29/2017  Substance and Sexual Activity   Alcohol use: Not Currently    Comment: 6 beers/week   Drug use: Yes    Types: Marijuana   Sexual activity: Not Currently  Other Topics Concern   Not on file  Social History Narrative   Not on file   Social Drivers of Health   Financial Resource Strain: Low Risk  (03/04/2024)   Overall Financial Resource Strain (CARDIA)    Difficulty of Paying Living Expenses: Not hard at all  Food Insecurity: No Food Insecurity (05/26/2024)   Hunger Vital Sign    Worried About Running Out of Food in the Last Year: Never true    Ran Out of Food in the Last Year: Never true  Transportation Needs: No Transportation Needs (05/26/2024)   PRAPARE - Administrator, Civil Service (Medical): No    Lack of Transportation (Non-Medical): No  Physical Activity: Sufficiently Active (03/04/2024)   Exercise Vital Sign    Days of Exercise per Week: 3 days    Minutes of Exercise per Session: 60 min  Stress: No Stress Concern Present (03/04/2024)   [employer] of Occupational Health - Occupational Stress Questionnaire    Feeling of Stress : Not at all  Social Connections: Socially Isolated (05/26/2024)   Social Connection and Isolation Panel [NHANES]    Frequency of  Communication with Friends and Family: Three times a week    Frequency of Social Gatherings with Friends and Family: Once a week    Attends Religious Services: Never    Database administrator or Organizations: No    Attends Banker Meetings: Never    Marital Status: Never married  Intimate Partner Violence: Not At Risk (05/26/2024)   Humiliation, Afraid, Rape, and Kick questionnaire    Fear of Current or Ex-Partner: No    Emotionally Abused: No    Physically Abused: No    Sexually Abused: No     Review of Systems: Review of Systems  Constitutional:  Negative for chills, fever and malaise/fatigue.  HENT:  Negative for congestion, sore throat and tinnitus.   Eyes:  Negative for blurred vision and redness.  Respiratory:  Negative for cough, shortness of breath and wheezing.   Cardiovascular:  Negative for chest pain, palpitations, claudication and leg swelling.  Gastrointestinal:  Positive for nausea and vomiting. Negative for abdominal pain, blood in stool and diarrhea.  Genitourinary:  Negative for flank pain, frequency and hematuria.  Musculoskeletal:  Negative for back pain, falls and myalgias.  Skin:  Negative for rash.  Neurological:  Negative for dizziness, weakness and headaches.  Endo/Heme/Allergies:  Does not bruise/bleed easily.  Psychiatric/Behavioral:  Negative for depression. The patient is not nervous/anxious and does not have insomnia.     Vital Signs: Blood pressure (!) 150/79, pulse 87, temperature 98 F (36.7 C), resp. rate 18, height 5\' 3"  (1.6 m), weight 56.7 kg, SpO2 97%.  Weight trends: Filed Weights   05/26/24 0710  Weight: 56.7 kg    Physical Exam: General: NAD  Head: Normocephalic, atraumatic. Moist oral mucosal membranes  Eyes: Anicteric  Lungs:  Clear to auscultation, normal effort  Heart: Regular rate and rhythm  Abdomen:  Soft, nontender,   Extremities: No peripheral edema.  Neurologic: Alert and oriented, moving all  four  extremities  Skin: No lesions  Access: Left aVF     Lab results: Basic Metabolic Panel: Recent Labs  Lab 05/26/24 0750  NA 116*  K 3.1*  CL 71*  CO2 24  GLUCOSE 156*  BUN 54*  CREATININE 10.63*  CALCIUM  8.4*  MG 2.3    Liver Function Tests: Recent Labs  Lab 05/26/24 0750  AST 13*  ALT 14  ALKPHOS 90  BILITOT 1.5*  PROT 6.5  ALBUMIN 4.2   Recent Labs  Lab 05/26/24 0750  LIPASE 221*   No results for input(s): "AMMONIA" in the last 168 hours.  CBC: Recent Labs  Lab 05/26/24 0750  WBC 10.2  HGB 11.7*  HCT 31.3*  MCV 86.9  PLT 168    Cardiac Enzymes: No results for input(s): "CKTOTAL", "CKMB", "CKMBINDEX", "TROPONINI" in the last 168 hours.  BNP: Invalid input(s): "POCBNP"  CBG: Recent Labs  Lab 05/26/24 0710 05/26/24 1059  GLUCAP 161* 183*    Microbiology: Results for orders placed or performed in visit on 01/07/23  COVID-19, Flu A+B and RSV     Status: Abnormal   Collection Time: 01/07/23 10:47 AM   Specimen: Nasal Swab  Result Value Ref Range Status   SARS-CoV-2, NAA Not Detected Not Detected Final   Influenza A, NAA Detected (A) Not Detected Final   Influenza B, NAA Not Detected Not Detected Final   RSV, NAA Not Detected Not Detected Final   Test Information: Comment  Final    Comment: This nucleic acid amplification test was developed and its performance characteristics determined by [employer]. Nucleic acid amplification tests include RT-PCR and TMA. This test has not been FDA cleared or approved. This test has been authorized by FDA under an Emergency Use Authorization (EUA). This test is only authorized for the duration of time the declaration that circumstances exist justifying the authorization of the emergency use of in vitro diagnostic tests for detection of SARS-CoV-2 virus and/or diagnosis of COVID-19 infection under section 564(b)(1) of the Act, 21 U.S.C. 161WRU-0(A) (1), unless the authorization is terminated or  revoked sooner. When diagnostic testing is negative, the possibility of a false negative result should be considered in the context of a patient's recent exposures and the presence of clinical signs and symptoms consistent with COVID-19. An individual without symptoms of COVID-19 and who is not shedding SARS-CoV-2 virus wo uld expect to have a negative (not detected) result in this assay.     Coagulation Studies: No results for input(s): "LABPROT", "INR" in the last 72 hours.  Urinalysis: No results for input(s): "COLORURINE", "LABSPEC", "PHURINE", "GLUCOSEU", "HGBUR", "BILIRUBINUR", "KETONESUR", "PROTEINUR", "UROBILINOGEN", "NITRITE", "LEUKOCYTESUR" in the last 72 hours.  Invalid input(s): "APPERANCEUR"    Imaging: DG Chest 2 View Result Date: 05/26/2024 CLINICAL DATA:  Shortness of breath. EXAM: CHEST - 2 VIEW COMPARISON:  09/30/2021 FINDINGS: The heart size and mediastinal contours are within normal limits. Both lungs are clear. The visualized skeletal structures are unremarkable. IMPRESSION: No active cardiopulmonary disease. Electronically Signed   By: Donnal Fusi M.D.   On: 05/26/2024 07:45     Assessment & Plan: George [name] is a 38 y.o.  male with past medical conditions including hypertension, diabetes, CVA, chronic hyponatremia, gastroparesis, and end-stage renal disease on hemodialysis, who was admitted to [facility] on 05/26/2024 for Hyponatremia [E87.1] Elevated lipase [R74.8] Nausea and vomiting, unspecified vomiting type [R11.2]   Hyponatremia with end-stage renal disease on hemodialysis.  Sodium 116 on admission.  Last dialysis treatment received  on Tuesday.  Has history of chronic hyponatremia however recent outpatient level was 134.  Will also shortened dialysis today, 2 hours.  Will adjust sodium during treatment to 135 to allow for slow correction.  Will assess patient for additional treatment in a.m., if remains inpatient.  2. Anemia of chronic kidney  disease Lab Results  Component Value Date   HGB 11.7 (L) 05/26/2024    Hemoglobin within desired range at this time.  No need for ESA's.  3. Secondary Hyperparathyroidism: with outpatient labs: PTH 459, phosphorus 4.0, calcium  9.4 on 5/6.   Lab Results  Component Value Date   CALCIUM  8.4 (L) 05/26/2024   CAION 1.01 (L) 09/29/2021   PHOS 3.8 09/30/2021    Patient prescribed calcitriol and cholecalciferol  outpatient.  Will continue to monitor bone minerals.  4.  Hypertension with chronic kidney disease.  Home regimen includes amlodipine , carvedilol , and hydralazine .  LOS: 0 Maysa Lynn 5/29/20251:58 PM

## 2024-05-26 NOTE — ED Provider Notes (Signed)
 [facility] Provider Note    Event Date/Time   First MD Initiated Contact with Patient 05/26/24 [phone]     (approximate)   History   N/V/D   HPI  George [name] is a 38 year old male with history of T1DM, CVA, ESRD presenting to the ER for evaluation of nausea and vomiting.  4 days ago patient had onset of nausea with multiple episodes of nonbloody vomiting.  Reports occasional nonbloody diarrhea.  Has not been able to keep anything down.  Finishes dialysis session on Saturday, but did not attend his session on Tuesday as he was feeling sick.  Does report some mild shortness of breath.  Denies abdominal pain.  Says this feels a little bit like when he had low sodium several years ago.  Reviewed PCP visit from 03/04/2024.  And blood pressure at goal at that time.  Reviewed discharge summary from 10/01/2021.  At that time patient presented with nausea and vomiting found to have a serum sodium of 113 thought to be related to dehydration and total body salt depletion.      Physical Exam   Triage Vital Signs: ED Triage Vitals  Encounter Vitals Group     BP 05/26/24 0711 117/79     Systolic BP Percentile --      Diastolic BP Percentile --      Pulse Rate 05/26/24 0710 96     Resp 05/26/24 0710 20     Temp 05/26/24 0710 98 F (36.7 C)     Temp Source 05/26/24 0710 Oral     SpO2 05/26/24 0710 100 %     Weight 05/26/24 0710 125 lb (56.7 kg)     Height 05/26/24 0710 5\' 3"  (1.6 m)     Head Circumference --      Peak Flow --      Pain Score 05/26/24 0710 1     Pain Loc --      Pain Education --      Exclude from Growth Chart --     Most recent vital signs: Vitals:   05/26/24 0710 05/26/24 0711  BP:  117/79  Pulse: 96   Resp: 20   Temp: 98 F (36.7 C)   SpO2: 100%      General: Awake, interactive  CV:  Regular rate, good peripheral perfusion.  Resp:  Unlabored respirations, lungs clear to auscultation Abd:  Nondistended, soft,  nontender Neuro:  Symmetric facial movement, fluid speech   ED Results / Procedures / Treatments   Labs (all labs ordered are listed, but only abnormal results are displayed) Labs Reviewed  LIPASE, BLOOD - Abnormal; Notable for the following components:      Result Value   Lipase 221 (*)    All other components within normal limits  COMPREHENSIVE METABOLIC PANEL WITH GFR - Abnormal; Notable for the following components:   Sodium 116 (*)    Potassium 3.1 (*)    Chloride 71 (*)    Glucose, Bld 156 (*)    BUN 54 (*)    Creatinine, Ser 10.63 (*)    Calcium  8.4 (*)    AST 13 (*)    Total Bilirubin 1.5 (*)    GFR, Estimated 6 (*)    Anion gap 21 (*)    All other components within normal limits  CBC - Abnormal; Notable for the following components:   RBC 3.60 (*)    Hemoglobin 11.7 (*)    HCT 31.3 (*)    MCHC  37.4 (*)    All other components within normal limits  CBG MONITORING, ED - Abnormal; Notable for the following components:   Glucose-Capillary 161 (*)    All other components within normal limits  GASTROINTESTINAL PANEL BY PCR, STOOL (REPLACES STOOL CULTURE)  URINALYSIS, ROUTINE W REFLEX MICROSCOPIC     EKG EKG independently reviewed interpreted by myself (ER attending) demonstrates:  EKG demonstrates sinus rhythm at a rate of 93, PR 162, QRS 102, QTc 457, nonspecific ST changes  RADIOLOGY Imaging independently reviewed and interpreted by myself demonstrates:  CXR without focal consolidation  Formal Radiology Read:  DG Chest 2 View Result Date: 05/26/2024 CLINICAL DATA:  Shortness of breath. EXAM: CHEST - 2 VIEW COMPARISON:  09/30/2021 FINDINGS: The heart size and mediastinal contours are within normal limits. Both lungs are clear. The visualized skeletal structures are unremarkable. IMPRESSION: No active cardiopulmonary disease. Electronically Signed   By: Donnal Fusi M.D.   On: 05/26/2024 07:45    PROCEDURES:  Critical Care performed: Yes, see critical care  procedure note(s)  CRITICAL CARE Performed by: Claria Crofts   Total critical care time: 31 minutes  Critical care time was exclusive of separately billable procedures and treating other patients.  Critical care was necessary to treat or prevent imminent or life-threatening deterioration.  Critical care was time spent personally by me on the following activities: development of treatment plan with patient and/or surrogate as well as nursing, discussions with consultants, evaluation of patient's response to treatment, examination of patient, obtaining history from patient or surrogate, ordering and performing treatments and interventions, ordering and review of laboratory studies, ordering and review of radiographic studies, pulse oximetry and re-evaluation of patient's condition.   Procedures   MEDICATIONS ORDERED IN ED: Medications  Chlorhexidine  Gluconate Cloth 2 % PADS 6 each (0 each Topical Hold 05/26/24 0955)  sodium chloride  0.9 % bolus 250 mL (0 mLs Intravenous Stopped 05/26/24 0806)  ondansetron  (ZOFRAN ) injection 4 mg (4 mg Intravenous Given 05/26/24 0751)  metoCLOPramide  (REGLAN ) injection 10 mg (10 mg Intravenous Given 05/26/24 0951)     IMPRESSION / MDM / ASSESSMENT AND PLAN / ED COURSE  I reviewed the triage vital signs and the nursing notes.  Differential diagnosis includes, but is not limited to, DKA, electrolyte abnormality including hyponatremia, hyperkalemia in the setting of missed dialysis, anemia, viral GI illness, biliary pathology, pancreatitis lower suspicion significant acute intra-abdominal process given reassuring abdominal exam  Patient's presentation is most consistent with acute presentation with potential threat to life or bodily function.  38 year old male presenting with nausea and vomiting.  Stable vitals on presentation.  Reports significant vomiting, but has also not been dialyzed since Saturday, will start with small IV fluid bolus.  Labs, nausea  medication ordered.   Clinical Course as of 05/26/24 0958  Thu May 26, 2024  0920 Sodium(!!): 116 CMP with significant hyponatremia. No neuro changes or indication for hypertonic saline. Reassuring potassium at 3.1.  Elevated BUN and creatinine consistent with known history of ESRD.  Of note lipase also elevated at 221.  Patient without any epigastric discomfort, possibly reactive in the setting of his vomiting or reflective of mild pancreatitis.  Will discuss case with nephrology. [NR]  [id number] Case discussed with Dr. Rhesa Celeste with nephrology.  Notes that the patient will likely require slow dialysis for correction of his sodium which his team will coordinate.  Will reach out to hospitalist team to discuss admission. [NR]  [phone] Case discussed with Dr. Jeane Miguel who will evaluate the  patient for anticipated admission.  [NR]    Clinical Course User Index [NR] Claria Crofts, MD     FINAL CLINICAL IMPRESSION(S) / ED DIAGNOSES   Final diagnoses:  Hyponatremia  Nausea and vomiting, unspecified vomiting type  Elevated lipase     Rx / DC Orders   ED Discharge Orders     None        Note:  This document was prepared using Dragon voice recognition software and may include unintentional dictation errors.   Claria Crofts, MD 05/26/24 [phone]

## 2024-05-26 NOTE — H&P (Signed)
 History and Physical    George [name] WJX:[id number] DOB: Apr 14, 1986 DOA: 05/26/2024  PCP: Lawrance Presume, MD (Confirm with patient/family/NH records and if not entered, this has to be entered at [facility] point of entry) Patient coming from: Home  I have personally briefly reviewed patient's old medical records in [facility]  Chief Complaint: Nauseous vomiting  HPI: George [name] is a 38 y.o. male with medical history significant of IDDM type I, diabetic gastroparesis, CVA, ESRD on HD TTS, HTN, chronic hyponatremia, paroxysmal SVT, presented with persistent nauseous vomiting and dry heaves.  Symptoms started Sunday evening, patient started to have nauseous vomiting after eating dinner.  Denies any sick contacts, no abdominal pain no diarrhea.  Vomitus contained clear liquids and stomach content nonbloody none bowel and has been only dry heaves since yesterday.  Since then patient has had repeated nauseous vomiting and unable to take any meal and very little liquid as well.  He felt " gastroparesis flareup".  He feels weak but denied any blurry vision double vision, no weakness of any of the limbs.  ED Course: Afebrile, blood pressure borderline low, but none tachycardia, blood work showed sodium 116 potassium 3.1 BUN 54 creatinine 10.6.  Chest x-ray negative for acute findings.  Review of Systems: As per HPI otherwise 14 point review of systems negative.    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/19/2019   Hypertension    Hyponatremia 09/30/2021   Neurologic deficit due to acute ischemic cerebrovascular accident (CVA) (HCC) 06/13/2019   Supraventricular tachycardia (HCC)    Type 1 diabetes (HCC)     Past Surgical History:  Procedure Laterality Date   A/V FISTULAGRAM N/A 02/15/2024   Procedure: A/V Fistulagram;  Surgeon:  Melodie Spry, MD;  Location[facility] INVASIVE CV LAB;  Service: Cardiovascular;  Laterality: N/A;   AV FISTULA PLACEMENT Left 03/15/2021   Procedure: LEFT UPPER EXTREMITY ARTERIOVENOUS (AV) FISTULA CREATION;  Surgeon: Mayo Speck, MD;  Location: [facility] OR;  Service: Vascular;  Laterality: Left;   BUBBLE STUDY  06/20/2019   Procedure: BUBBLE STUDY;  Surgeon: Elmyra Haggard, MD;  Location: [facility] ENDOSCOPY;  Service: Cardiovascular;;   PERIPHERAL VASCULAR BALLOON ANGIOPLASTY  02/15/2024   Procedure: PERIPHERAL VASCULAR BALLOON ANGIOPLASTY;  Surgeon: Melodie Spry, MD;  Location: [facility] INVASIVE CV LAB;  Service: Cardiovascular;;  70/80% inflow cephlic   TEE WITHOUT CARDIOVERSION N/A 06/20/2019   Procedure: TRANSESOPHAGEAL ECHOCARDIOGRAM (TEE);  Surgeon: Elmyra Haggard, MD;  Location: [facility] ENDOSCOPY;  Service: Cardiovascular;  Laterality: N/A;     reports that he quit smoking about 3 years ago. His smoking use included cigarettes. He started smoking about 13 years ago. He has a 2.4 pack-year smoking history. He has never used smokeless tobacco. He reports that he does not currently use alcohol. He reports current drug use. Drug: Marijuana.  Allergies  Allergen Reactions   Bee Pollen Other (See Comments)    Stuffy nose    Family History  Problem Relation Age of Onset   Hypertension Mother    Diabetes Mother    Hyperlipidemia Mother    Diabetes Father      Prior to Admission medications   Medication Sig Start Date End Date Taking? Authorizing Provider  amLODipine  (NORVASC ) 10 MG tablet Take 1 tablet (10 mg total) by mouth daily. 09/02/23   Brent Cambric,  Scherrie Curt, MD  carvedilol  (COREG ) 25 MG tablet Take 1 tablet (25 mg total) by mouth 2 (two) times daily with a meal. 03/04/24 08/31/24  Lawrance Presume, MD  Cholecalciferol (VITAMIN D3) 50 MCG (2000 UT) TABS Take 2,000 Units by mouth daily.    [provider]  clopidogrel  (PLAVIX ) 75 MG tablet TAKE 1 TABLET(75 MG) BY MOUTH DAILY 03/29/24   Lawrance Presume, MD   Continuous Blood Gluc Receiver (DEXCOM G6 RECEIVER) DEVI 1 each by Does not apply route daily as needed. 01/07/23   Hassie Lint, PA-C  Continuous Blood Gluc Sensor (DEXCOM G7 SENSOR) MISC Use to measure blood sugar 08/06/22   Vernell Goldsmith, MD  Continuous Glucose Transmitter (DEXCOM G6 TRANSMITTER) MISC Use as directed daily as needed. 09/02/23   Vernell Goldsmith, MD  glucose blood (ONETOUCH VERIO) test strip Use as instructed to check blood sugar 3 times daily. 01/08/21   Vernell Goldsmith, MD  hydrALAZINE  (APRESOLINE ) 100 MG tablet Take 1 tablet (100 mg total) by mouth 2 (two) times daily. Hold dialysis day 09/02/23   Vernell Goldsmith, MD  insulin  glargine (LANTUS  SOLOSTAR) 100 UNIT/ML Solostar Pen Inject 16 Units into the skin daily. 03/04/24   Lawrance Presume, MD  Insulin  Pen Needle (PEN NEEDLES 3/16") 31G X 5 MM MISC To inject insulin  with novolog  flex pen, 3 times daily with meals. 09/02/23   Vernell Goldsmith, MD  NOVOLOG  FLEXPEN 100 UNIT/ML FlexPen Inject 5-30 Units into the skin See admin instructions. Mac daily 30 units to include correction scale and snack coverage three times a day with meals Patient taking differently: Inject into the skin See admin instructions. Inject 5-30 Units into the skin See admin instructions. Mac daily 30 units to include correction scale and snack coverage three-fourtimes a day with meals 09/02/23   Vernell Goldsmith, MD  pantoprazole  (PROTONIX ) 40 MG tablet Take 1 tablet (40 mg total) by mouth daily. 09/02/23   Vernell Goldsmith, MD  polyvinyl alcohol (LIQUIFILM TEARS) 1.4 % ophthalmic solution Place into both eyes as needed for dry eyes.    [provider]  promethazine  (PHENERGAN ) 25 MG tablet Take 25 mg by mouth 3 (three) times daily as needed for nausea or vomiting. 02/17/23   [provider]  rosuvastatin  (CRESTOR ) 20 MG tablet Take 1 tablet (20 mg total) by mouth daily. 09/02/23   Vernell Goldsmith, MD    Physical Exam: Vitals:    05/26/24 0710 05/26/24 0711  BP:  117/79  Pulse: 96   Resp: 20   Temp: 98 F (36.7 C)   TempSrc: Oral   SpO2: 100%   Weight: 56.7 kg   Height: 5\' 3"  (1.6 m)     Constitutional: NAD, calm, comfortable Vitals:   05/26/24 0710 05/26/24 0711  BP:  117/79  Pulse: 96   Resp: 20   Temp: 98 F (36.7 C)   TempSrc: Oral   SpO2: 100%   Weight: 56.7 kg   Height: 5\' 3"  (1.6 m)    Eyes: PERRL, lids and conjunctivae normal ENMT: Mucous membranes are dry. Posterior pharynx clear of any exudate or lesions.Normal dentition.  Neck: normal, supple, no masses, no thyromegaly Respiratory: clear to auscultation bilaterally, no wheezing, no crackles. Normal respiratory effort. No accessory muscle use.  Cardiovascular: Regular rate and rhythm, no murmurs / rubs / gallops. No extremity edema. 2+ pedal pulses. No carotid bruits.  Abdomen: no tenderness, no masses palpated. No hepatosplenomegaly. Bowel sounds positive.  Musculoskeletal: no clubbing / cyanosis. No joint deformity upper and lower extremities. Good ROM, no contractures. Normal muscle tone.  Skin: no rashes, lesions, ulcers. No induration Neurologic: CN 2-12 grossly intact. Sensation intact, DTR normal. Strength 5/5 in all 4.  Psychiatric: Normal judgment and insight. Alert and oriented x 3. Normal mood.     Labs on Admission: I have personally reviewed following labs and imaging studies  CBC: Recent Labs  Lab 05/26/24 0750  WBC 10.2  HGB 11.7*  HCT 31.3*  MCV 86.9  PLT 168   Basic Metabolic Panel: Recent Labs  Lab 05/26/24 0750  NA 116*  K 3.1*  CL 71*  CO2 24  GLUCOSE 156*  BUN 54*  CREATININE 10.63*  CALCIUM  8.4*   GFR: Estimated Creatinine Clearance: 7.6 mL/min (A) (by C-G formula based on SCr of 10.63 mg/dL (H)). Liver Function Tests: Recent Labs  Lab 05/26/24 0750  AST 13*  ALT 14  ALKPHOS 90  BILITOT 1.5*  PROT 6.5  ALBUMIN 4.2   Recent Labs  Lab 05/26/24 0750  LIPASE 221*   No results for  input(s): "AMMONIA" in the last 168 hours. Coagulation Profile: No results for input(s): "INR", "PROTIME" in the last 168 hours. Cardiac Enzymes: No results for input(s): "CKTOTAL", "CKMB", "CKMBINDEX", "TROPONINI" in the last 168 hours. BNP (last 3 results) No results for input(s): "PROBNP" in the last 8760 hours. HbA1C: No results for input(s): "HGBA1C" in the last 72 hours. CBG: Recent Labs  Lab 05/26/24 0710  GLUCAP 161*   Lipid Profile: No results for input(s): "CHOL", "HDL", "LDLCALC", "TRIG", "CHOLHDL", "LDLDIRECT" in the last 72 hours. Thyroid  Function Tests: No results for input(s): "TSH", "T4TOTAL", "FREET4", "T3FREE", "THYROIDAB" in the last 72 hours. Anemia Panel: No results for input(s): "VITAMINB12", "FOLATE", "FERRITIN", "TIBC", "IRON ", "RETICCTPCT" in the last 72 hours. Urine analysis:    Component Value Date/Time   COLORURINE STRAW (A) 09/30/2021 0405   APPEARANCEUR CLEAR 09/30/2021 0405   LABSPEC 1.004 (L) 09/30/2021 0405   PHURINE 6.0 09/30/2021 0405   GLUCOSEU 50 (A) 09/30/2021 0405   HGBUR SMALL (A) 09/30/2021 0405   BILIRUBINUR NEGATIVE 09/30/2021 0405   KETONESUR NEGATIVE 09/30/2021 0405   PROTEINUR 100 (A) 09/30/2021 0405   NITRITE NEGATIVE 09/30/2021 0405   LEUKOCYTESUR NEGATIVE 09/30/2021 0405    Radiological Exams on Admission: DG Chest 2 View Result Date: 05/26/2024 CLINICAL DATA:  Shortness of breath. EXAM: CHEST - 2 VIEW COMPARISON:  09/30/2021 FINDINGS: The heart size and mediastinal contours are within normal limits. Both lungs are clear. The visualized skeletal structures are unremarkable. IMPRESSION: No active cardiopulmonary disease. Electronically Signed   By: Donnal Fusi M.D.   On: 05/26/2024 07:45    EKG: Independently reviewed.  Borderline sinus tachycardia, no acute ST changes.  Assessment/Plan Principal Problem:   Hyponatremia Active Problems:   Gastroparesis  (please populate well all problems here in Problem List. (For  example, if patient is on BP meds at home and you resume or decide to hold them, it is a problem that needs to be her. Same for CAD, COPD, HLD and so on)  Acute on chronic hyponatremia - Hypovolemic secondary to repeated nauseous vomiting and dehydration - Case was discussed between ED physician and nephrology, patient will get emergency HD this morning to correct sodium level, expect correction of sodium level 24-48 hours. - No neurological deficit, repeat sodium level this evening  Persistent nauseous vomiting and dry heaves - Clinically likely recurrent gastroparesis.  Abdominal and physical exam  benign. - Trial of Reglan  5 mg every 6 hours - As needed Zofran   Hypokalemia - P.o. replacement - Check magnesium  level  HTN - Blood pressure borderline low, hold off amlodipine  and decreased Coreg  from 25 mg to 6.25 mg, change hydralazine  to as needed  History of CVA - No acute concern, continue Plavix  and statin  IDDM -SSI -Hold off Lantus   DVT prophylaxis: SCD Code Status: Full code Family Communication: Father at bedside Disposition Plan: Expect less than 2 midnight hospital stay Consults called: Nephrology Admission status: Telemetry observation   [place] MD [facility] Pager [phone]  05/26/2024, 10:06 AM

## 2024-05-27 ENCOUNTER — Other Ambulatory Visit (HOSPITAL_COMMUNITY): Payer: Self-pay

## 2024-05-27 ENCOUNTER — Encounter: Payer: Self-pay | Admitting: Internal Medicine

## 2024-05-27 ENCOUNTER — Telehealth (HOSPITAL_COMMUNITY): Payer: Self-pay | Admitting: Pharmacy Technician

## 2024-05-27 DIAGNOSIS — E871 Hypo-osmolality and hyponatremia: Secondary | ICD-10-CM | POA: Diagnosis present

## 2024-05-27 DIAGNOSIS — Z79899 Other long term (current) drug therapy: Secondary | ICD-10-CM | POA: Diagnosis not present

## 2024-05-27 DIAGNOSIS — N2581 Secondary hyperparathyroidism of renal origin: Secondary | ICD-10-CM | POA: Diagnosis present

## 2024-05-27 DIAGNOSIS — E860 Dehydration: Secondary | ICD-10-CM | POA: Diagnosis present

## 2024-05-27 DIAGNOSIS — E785 Hyperlipidemia, unspecified: Secondary | ICD-10-CM | POA: Diagnosis present

## 2024-05-27 DIAGNOSIS — Z992 Dependence on renal dialysis: Secondary | ICD-10-CM | POA: Diagnosis not present

## 2024-05-27 DIAGNOSIS — Z8249 Family history of ischemic heart disease and other diseases of the circulatory system: Secondary | ICD-10-CM | POA: Diagnosis not present

## 2024-05-27 DIAGNOSIS — E1022 Type 1 diabetes mellitus with diabetic chronic kidney disease: Secondary | ICD-10-CM | POA: Diagnosis present

## 2024-05-27 DIAGNOSIS — D631 Anemia in chronic kidney disease: Secondary | ICD-10-CM | POA: Diagnosis present

## 2024-05-27 DIAGNOSIS — Z7902 Long term (current) use of antithrombotics/antiplatelets: Secondary | ICD-10-CM | POA: Diagnosis not present

## 2024-05-27 DIAGNOSIS — I2510 Atherosclerotic heart disease of native coronary artery without angina pectoris: Secondary | ICD-10-CM | POA: Diagnosis present

## 2024-05-27 DIAGNOSIS — K3184 Gastroparesis: Secondary | ICD-10-CM | POA: Diagnosis present

## 2024-05-27 DIAGNOSIS — E876 Hypokalemia: Secondary | ICD-10-CM | POA: Diagnosis present

## 2024-05-27 DIAGNOSIS — Z833 Family history of diabetes mellitus: Secondary | ICD-10-CM | POA: Diagnosis not present

## 2024-05-27 DIAGNOSIS — Z8673 Personal history of transient ischemic attack (TIA), and cerebral infarction without residual deficits: Secondary | ICD-10-CM | POA: Diagnosis not present

## 2024-05-27 DIAGNOSIS — I120 Hypertensive chronic kidney disease with stage 5 chronic kidney disease or end stage renal disease: Secondary | ICD-10-CM | POA: Diagnosis present

## 2024-05-27 DIAGNOSIS — Z794 Long term (current) use of insulin: Secondary | ICD-10-CM | POA: Diagnosis not present

## 2024-05-27 DIAGNOSIS — I4719 Other supraventricular tachycardia: Secondary | ICD-10-CM | POA: Diagnosis present

## 2024-05-27 DIAGNOSIS — N186 End stage renal disease: Secondary | ICD-10-CM

## 2024-05-27 DIAGNOSIS — Z83438 Family history of other disorder of lipoprotein metabolism and other lipidemia: Secondary | ICD-10-CM | POA: Diagnosis not present

## 2024-05-27 DIAGNOSIS — E861 Hypovolemia: Secondary | ICD-10-CM | POA: Diagnosis present

## 2024-05-27 DIAGNOSIS — R112 Nausea with vomiting, unspecified: Secondary | ICD-10-CM

## 2024-05-27 DIAGNOSIS — Z87891 Personal history of nicotine dependence: Secondary | ICD-10-CM | POA: Diagnosis not present

## 2024-05-27 DIAGNOSIS — R748 Abnormal levels of other serum enzymes: Secondary | ICD-10-CM | POA: Diagnosis present

## 2024-05-27 DIAGNOSIS — E1043 Type 1 diabetes mellitus with diabetic autonomic (poly)neuropathy: Secondary | ICD-10-CM | POA: Diagnosis present

## 2024-05-27 LAB — BASIC METABOLIC PANEL WITH GFR
Anion gap: 17 — ABNORMAL HIGH (ref 5–15)
BUN: 41 mg/dL — ABNORMAL HIGH (ref 6–20)
CO2: 23 mmol/L (ref 22–32)
Calcium: 8.4 mg/dL — ABNORMAL LOW (ref 8.9–10.3)
Chloride: 82 mmol/L — ABNORMAL LOW (ref 98–111)
Creatinine, Ser: 8.68 mg/dL — ABNORMAL HIGH (ref 0.61–1.24)
GFR, Estimated: 7 mL/min — ABNORMAL LOW (ref >60)
Glucose, Bld: 324 mg/dL — ABNORMAL HIGH (ref 70–99)
Potassium: 3.8 mmol/L (ref 3.5–5.1)
Sodium: 122 mmol/L — ABNORMAL LOW (ref 135–145)

## 2024-05-27 LAB — GLUCOSE, CAPILLARY
Glucose-Capillary: 397 mg/dL — ABNORMAL HIGH (ref 70–99)
Glucose-Capillary: 282 mg/dL — ABNORMAL HIGH (ref 70–99)
Glucose-Capillary: 169 mg/dL — ABNORMAL HIGH (ref 70–99)
Glucose-Capillary: 149 mg/dL — ABNORMAL HIGH (ref 70–99)

## 2024-05-27 LAB — GASTROINTESTINAL PANEL BY PCR, STOOL (REPLACES STOOL CULTURE)

## 2024-05-27 LAB — LIPASE, BLOOD: Lipase: 137 U/L — ABNORMAL HIGH (ref 11–51)

## 2024-05-27 LAB — HEPATITIS B SURFACE ANTIGEN: Hepatitis B Surface Ag: NONREACTIVE

## 2024-05-27 LAB — HIV ANTIBODY (ROUTINE TESTING W REFLEX): HIV Screen 4th Generation wRfx: NONREACTIVE

## 2024-05-27 MED ORDER — INSULIN GLARGINE-YFGN 100 UNIT/ML ~~LOC~~ SOLN
20.0000 [IU] | Freq: Every day | SUBCUTANEOUS | Status: DC
  Administered 2024-05-28: 20 [IU] via SUBCUTANEOUS
  Filled 2024-05-27: qty 0.2

## 2024-05-27 MED ORDER — INSULIN GLARGINE-YFGN 100 UNIT/ML ~~LOC~~ SOLN
16.0000 [IU] | Freq: Every day | SUBCUTANEOUS | Status: DC
  Administered 2024-05-27: 16 [IU] via SUBCUTANEOUS
  Filled 2024-05-27: qty 0.16

## 2024-05-27 MED ORDER — VITAMIN D 25 MCG (1000 UNIT) PO TABS
2000.0000 [IU] | ORAL_TABLET | Freq: Every day | ORAL | Status: DC
  Administered 2024-05-27 – 2024-05-28 (×2): 2000 [IU] via ORAL
  Filled 2024-05-27 (×2): qty 2

## 2024-05-27 MED ORDER — HYDRALAZINE HCL 50 MG PO TABS
100.0000 mg | ORAL_TABLET | Freq: Two times a day (BID) | ORAL | Status: DC
  Administered 2024-05-27 – 2024-05-28 (×3): 100 mg via ORAL
  Filled 2024-05-27 (×3): qty 2

## 2024-05-27 MED ORDER — HEPARIN SODIUM (PORCINE) 5000 UNIT/ML IJ SOLN
5000.0000 [IU] | Freq: Three times a day (TID) | INTRAMUSCULAR | Status: DC
  Administered 2024-05-27 – 2024-05-28 (×4): 5000 [IU] via SUBCUTANEOUS
  Filled 2024-05-27 (×4): qty 1

## 2024-05-27 MED ORDER — AMLODIPINE BESYLATE 10 MG PO TABS
10.0000 mg | ORAL_TABLET | Freq: Every day | ORAL | Status: DC
  Administered 2024-05-27 – 2024-05-28 (×2): 10 mg via ORAL
  Filled 2024-05-27 (×2): qty 1

## 2024-05-27 MED ORDER — FERRIC CITRATE 1 GM 210 MG(FE) PO TABS
420.0000 mg | ORAL_TABLET | Freq: Three times a day (TID) | ORAL | Status: DC
  Administered 2024-05-27 – 2024-05-28 (×4): 420 mg via ORAL
  Filled 2024-05-27 (×5): qty 2

## 2024-05-27 MED ORDER — INSULIN ASPART 100 UNIT/ML IJ SOLN
6.0000 [IU] | Freq: Three times a day (TID) | INTRAMUSCULAR | Status: DC
  Administered 2024-05-27 – 2024-05-28 (×2): 6 [IU] via SUBCUTANEOUS
  Filled 2024-05-27 (×2): qty 1

## 2024-05-27 MED ORDER — INSULIN ASPART 100 UNIT/ML IJ SOLN: 4.0000 [IU] | Freq: Three times a day (TID) | INTRAMUSCULAR | Status: DC

## 2024-05-27 NOTE — Telephone Encounter (Signed)
 Pharmacy Patient Advocate Encounter  Insurance verification completed.    The patient is insured through [employer].     Ran test claim for [employer] 3 plus sensors and the product would need to be billed under the Part B benefits. Unable to provide the cost under that benefit.    This test claim was processed through [facility]- copay amounts may vary at other pharmacies due to pharmacy/plan contracts, or as the patient moves through the different stages of their insurance plan.

## 2024-05-27 NOTE — TOC CM/SW Note (Signed)
 Transition of Care [facility]) - Inpatient Brief Assessment   Patient Details  Name: George [name] MRN: [id number] Date of Birth: 10/24/1986  Transition of Care [facility]) CM/SW Contact:    Odilia Bennett, LCSW Phone Number: 05/27/2024, 3:50 PM   Clinical Narrative: CSW reviewed chart. No TOC needs identified so far. CSW will continue to follow progress. Please place [facility] consult if any needs arise.  Transition of Care Asessment: Insurance and Status: Insurance coverage has been reviewed Patient has primary care physician: Yes Home environment has been reviewed: Single family home Prior level of function:: Not documented Prior/Current Home Services: No current home services Social Drivers of Health Review: SDOH reviewed interventions complete Readmission risk has been reviewed: Yes Transition of care needs: no transition of care needs at this time

## 2024-05-27 NOTE — Progress Notes (Signed)
 [facility]  ROUNDING NOTE   Subjective:   Patient seen sitting up in bed Continues to have mild nausea, no vomiting Remains on room air with no lower extremity edema  Objective:  Vital signs in last 24 hours:  Temp:  [98 F (36.7 C)-99 F (37.2 C)] 98 F (36.7 C) (05/30 0740) Pulse Rate:  [90-103] 93 (05/30 0940) Resp:  [12-23] 16 (05/30 0740) BP: (151-197)/(72-99) 151/72 (05/30 1146) SpO2:  [98 %-100 %] 99 % (05/30 0740) Weight:  [56.6 kg] 56.6 kg (05/29 1548)  Weight change:  Filed Weights   05/26/24 0710 05/26/24 1548  Weight: 56.7 kg 56.6 kg    Intake/Output: No intake/output data recorded.   Intake/Output this shift:  No intake/output data recorded.  Physical Exam: General: NAD  Head: Normocephalic, atraumatic. Moist oral mucosal membranes  Eyes: Anicteric  Lungs:  Clear to auscultation  Heart: Regular rate and rhythm  Abdomen:  Soft, nontender  Extremities:  No peripheral edema.  Neurologic: Awake, alert, conversant  Skin: Warm,dry, no rash  Access: Lt AVF    Basic Metabolic Panel: Recent Labs  Lab 05/26/24 0750 05/26/24 2108 05/27/24 0454  NA 116* 121* 122*  K 3.1*  --  3.8  CL 71*  --  82*  CO2 24  --  23  GLUCOSE 156*  --  324*  BUN 54*  --  41*  CREATININE 10.63*  --  8.68*  CALCIUM  8.4*  --  8.4*  MG 2.3  --   --     Liver Function Tests: Recent Labs  Lab 05/26/24 0750  AST 13*  ALT 14  ALKPHOS 90  BILITOT 1.5*  PROT 6.5  ALBUMIN 4.2   Recent Labs  Lab 05/26/24 0750 05/27/24 0454  LIPASE 221* 137*   No results for input(s): "AMMONIA" in the last 168 hours.  CBC: Recent Labs  Lab 05/26/24 0750  WBC 10.2  HGB 11.7*  HCT 31.3*  MCV 86.9  PLT 168    Cardiac Enzymes: No results for input(s): "CKTOTAL", "CKMB", "CKMBINDEX", "TROPONINI" in the last 168 hours.  BNP: Invalid input(s): "POCBNP"  CBG: Recent Labs  Lab 05/26/24 1059 05/26/24 1757 05/26/24 2140 05/27/24 0739 05/27/24 1136  GLUCAP  183* 121* 235* 397* 282*    Microbiology: Results for orders placed or performed in visit on 01/07/23  COVID-19, Flu A+B and RSV     Status: Abnormal   Collection Time: 01/07/23 10:47 AM   Specimen: Nasal Swab  Result Value Ref Range Status   SARS-CoV-2, NAA Not Detected Not Detected Final   Influenza A, NAA Detected (A) Not Detected Final   Influenza B, NAA Not Detected Not Detected Final   RSV, NAA Not Detected Not Detected Final   Test Information: Comment  Final    Comment: This nucleic acid amplification test was developed and its performance characteristics determined by [employer]. Nucleic acid amplification tests include RT-PCR and TMA. This test has not been FDA cleared or approved. This test has been authorized by FDA under an Emergency Use Authorization (EUA). This test is only authorized for the duration of time the declaration that circumstances exist justifying the authorization of the emergency use of in vitro diagnostic tests for detection of SARS-CoV-2 virus and/or diagnosis of COVID-19 infection under section 564(b)(1) of the Act, 21 U.S.C. 161WRU-0(A) (1), unless the authorization is terminated or revoked sooner. When diagnostic testing is negative, the possibility of a false negative result should be considered in the context of a  patient's recent exposures and the presence of clinical signs and symptoms consistent with COVID-19. An individual without symptoms of COVID-19 and who is not shedding SARS-CoV-2 virus wo uld expect to have a negative (not detected) result in this assay.     Coagulation Studies: No results for input(s): "LABPROT", "INR" in the last 72 hours.  Urinalysis: Recent Labs    05/26/24 1351  COLORURINE STRAW*  LABSPEC 1.008  PHURINE 6.0  GLUCOSEU 50*  HGBUR NEGATIVE  BILIRUBINUR NEGATIVE  KETONESUR NEGATIVE  PROTEINUR 100*  NITRITE NEGATIVE  LEUKOCYTESUR NEGATIVE      Imaging: DG Chest 2 View Result Date:  05/26/2024 CLINICAL DATA:  Shortness of breath. EXAM: CHEST - 2 VIEW COMPARISON:  09/30/2021 FINDINGS: The heart size and mediastinal contours are within normal limits. Both lungs are clear. The visualized skeletal structures are unremarkable. IMPRESSION: No active cardiopulmonary disease. Electronically Signed   By: Donnal Fusi M.D.   On: 05/26/2024 07:45     Medications:     amLODipine   10 mg Oral Daily   carvedilol   6.25 mg Oral BID WC   Chlorhexidine  Gluconate Cloth  6 each Topical Q0600   cholecalciferol  2,000 Units Oral Daily   clopidogrel   75 mg Oral Daily   ferric citrate  420 mg Oral TID   hydrALAZINE   100 mg Oral BID   insulin  aspart  0-6 Units Subcutaneous TID AC & HS   insulin  aspart  4 Units Subcutaneous TID WC   insulin  glargine-yfgn  16 Units Subcutaneous Daily   metoCLOPramide  (REGLAN ) injection  5 mg Intravenous Q6H   pantoprazole   40 mg Oral Daily   rosuvastatin   20 mg Oral Daily   acetaminophen  **OR** acetaminophen , hydrALAZINE , ondansetron  **OR** ondansetron  (ZOFRAN ) IV  Assessment/ Plan:  George [name] is a 38 y.o.  male with past medical conditions including hypertension, diabetes, CVA, chronic hyponatremia, gastroparesis, and end-stage renal disease on hemodialysis, who was admitted to [facility] on 05/26/2024 for Hyponatremia [E87.1] Elevated lipase [R74.8] Nausea and vomiting, unspecified vomiting type [R11.2]  CK [facility]/TTS/Lt AVF  Hyponatremia with end-stage renal disease on hemodialysis. Sodium 116 on admission. Last dialysis treatment received on Tuesday. Has history of chronic hyponatremia however recent outpatient level was 134.  Patient received short dialysis treatment yesterday, sodium corrected from 116-122.  Patient will receive scheduled dialysis tomorrow with further correction.  Sodium goal 130.  2. Anemia of chronic kidney disease Lab Results  Component Value Date   HGB 11.7 (L) 05/26/2024    Hemoglobin within desired range.  No  need for ESA's.  3. Secondary Hyperparathyroidism: with outpatient labs: PTH 459, phosphorus 4.0, calcium  9.4 on 5/6.   Lab Results  Component Value Date   CALCIUM  8.4 (L) 05/27/2024   CAION 1.01 (L) 09/29/2021   PHOS 3.8 09/30/2021    Patient prescribed calcitriol and cholecalciferol outpatient.  Bone minerals within optimal range.  4.  Hypertension with chronic kidney disease.  Home regimen includes amlodipine , carvedilol , and hydralazine .   Blood pressure acceptable.   LOS: 0 Tanis Hensarling 5/30/20251:14 PM

## 2024-05-27 NOTE — Plan of Care (Signed)
   Problem: Education: Goal: Ability to describe self-care measures that may prevent or decrease complications (Diabetes Survival Skills Education) will improve Outcome: Progressing

## 2024-05-27 NOTE — Inpatient Diabetes Management (Signed)
 Inpatient Diabetes Program Recommendations  AACE/ADA: New Consensus Statement on Inpatient Glycemic Control (2015)  Target Ranges:  Prepandial:   less than 140 mg/dL      Peak postprandial:   less than 180 mg/dL (1-2 hours)      Critically ill patients:  140 - 180 mg/dL   Lab Results  Component Value Date   GLUCAP 397 (H) 05/27/2024   HGBA1C 5.1 05/26/2024    Latest Reference Range & Units 05/26/24 07:10 05/26/24 10:59 05/26/24 17:57 05/26/24 21:40 05/27/24 07:39  Glucose-Capillary 70 - 99 mg/dL 161 (H) 096 (H) 045 (H) 235 (H) 397 (H)  (H): Data is abnormally high  Diabetes history: DM1 Outpatient Diabetes medications: Semglee  16 units daily, Novolog  approx 4-5 units tid meal coverage based on carbohydrate intake Current orders for Inpatient glycemic control: Semglee  16 units daily, Novolog  0-6 units tid correction  Inpatient Diabetes Program Recommendations:   Spoke with patient and reviewed insulin  management. Patient took his Semglee  16 units prior to admission yesterday 05/26/24. Spoke with patient to discuss amount of insulin  patient normally takes ac meals. Please consider: -Add Novolog  4 units tid meal coverage if eats 50% meal  Patient has used Dexcom CGM but unable to afford. Checked also on [place] 3, Will given patient information that Jerrilyn Mor[employer] 3 would be $75 maximum. Will give patient 3 [place] 3 + sample sensors for home use per order of Dr. Lydia Sams.  Thank you, Lenox Bink E. Sydnei Ohaver, RN, MSN, CDCES  Diabetes Coordinator Inpatient Glycemic Control Team Team Pager [phone] (8am-5pm) 05/27/2024 11:35 AM

## 2024-05-27 NOTE — Telephone Encounter (Signed)
 Part B has not been allowing successful test claims.

## 2024-05-27 NOTE — Discharge Instructions (Addendum)

## 2024-05-27 NOTE — Plan of Care (Signed)

## 2024-05-27 NOTE — Progress Notes (Signed)
 [facility]  - [place] [facility]   PATIENT NAME: George [name]    MR#:  [id number]  DATE OF BIRTH:  [birth year]  SUBJECTIVE:  patient came in with nausea vomiting for last few days. Unable to keep anything down. Tolerating PO regular diet well. Father at bedside. Sugars trending up. Did miss insulin  in the setting of nausea vomiting. Patient eager to go home    VITALS:  Blood pressure (!) 146/73, pulse 92, temperature 98.5 F (36.9 C), resp. rate 18, height 5\' 3"  (1.6 m), weight 56.6 kg, SpO2 100%.  PHYSICAL EXAMINATION:   GENERAL:  38 y.o.-year-old patient with no acute distress.  LUNGS: Normal breath sounds bilaterally, no wheezing CARDIOVASCULAR: S1, S2 normal. No murmur   ABDOMEN: Soft, nontender, nondistended. Bowel sounds present.  EXTREMITIES: No  edema b/l. HD access    NEUROLOGIC: nonfocal  patient is alert and awake   LABORATORY PANEL:  CBC Recent Labs  Lab 05/26/24 0750  WBC 10.2  HGB 11.7*  HCT 31.3*  PLT 168    Chemistries  Recent Labs  Lab 05/26/24 0750 05/26/24 2108 05/27/24 0454  NA 116*   < > 122*  K 3.1*  --  3.8  CL 71*  --  82*  CO2 24  --  23  GLUCOSE 156*  --  324*  BUN 54*  --  41*  CREATININE 10.63*  --  8.68*  CALCIUM  8.4*  --  8.4*  MG 2.3  --   --   AST 13*  --   --   ALT 14  --   --   ALKPHOS 90  --   --   BILITOT 1.5*  --   --    < > = values in this interval not displayed.   Cardiac Enzymes No results for input(s): "TROPONINI" in the last 168 hours. RADIOLOGY:  DG Chest 2 View Result Date: 05/26/2024 CLINICAL DATA:  Shortness of breath. EXAM: CHEST - 2 VIEW COMPARISON:  09/30/2021 FINDINGS: The heart size and mediastinal contours are within normal limits. Both lungs are clear. The visualized skeletal structures are unremarkable. IMPRESSION: No active cardiopulmonary disease. Electronically Signed   By: Donnal Fusi M.D.   On: 05/26/2024 07:45    Assessment and Plan  George [name] is a 38 y.o.  male with medical history significant of IDDM type I, diabetic gastroparesis, CVA, ESRD on HD TTS, HTN, chronic hyponatremia, paroxysmal SVT, presented with persistent nauseous vomiting and dry heaves.   Acute on chronic hyponatremia secondary to G.I. losses from nausea vomiting gastroparesis - Hypovolemic secondary to repeated nauseous vomiting and dehydration -- No neurological deficit, repeat sodium level this evening -- came in with sodium of 116-- 122 -- baseline sodium 134 -- hemodynamically stable. Mentation stable   Persistent nauseous vomiting and dry heaves - Clinically likely recurrent gastroparesis.  Abdominal and physical exam benign. - Trial of Reglan  5 mg every 6 hours - As needed Zofran  -- patient tolerating regular diet   Hypokalemia - P.o. replacement-- potassium 3.8  HTN - Blood pressure borderline low yesterday -- resume home meds now that blood pressure is trending up   history of CVA - No acute concern, continue Plavix  and statin   IDDM with end-stage renal failure on dialysis -SSI -sugars trending up. Will resume patient's semglee   --DM coordinator input noted --A1c 5.1%  Patient is eager to discharge. Discussed with him to monitor sodium overnight. He is agreeable  Procedures: Family communication :Father Consults :  nephrology CODE STATUS: full DVT Prophylaxis :heparin  Level of care: Telemetry Medical Status is: Inpatient Remains inpatient appropriate because: hyponatremia    TOTAL TIME TAKING CARE OF THIS PATIENT: 45 minutes.  >50% time spent on counselling and coordination of care  Note: This dictation was prepared with Dragon dictation along with smaller phrase technology. Any transcriptional errors that result from this process are unintentional.  Melvinia Stager M.D    [facility]   CC: Primary care physician; Lawrance Presume, MD

## 2024-05-27 NOTE — Care Management Obs Status (Signed)
 MEDICARE OBSERVATION STATUS NOTIFICATION   Patient Details  Name: Agastya Meister MRN: [id number] Date of Birth: [birth year]   Medicare Observation Status Notification Given:  No (patient did not want a copy)    Anise Kerns 05/27/2024, 11:57 AM

## 2024-05-28 DIAGNOSIS — E871 Hypo-osmolality and hyponatremia: Secondary | ICD-10-CM | POA: Diagnosis not present

## 2024-05-28 LAB — GLUCOSE, CAPILLARY
Glucose-Capillary: 119 mg/dL — ABNORMAL HIGH (ref 70–99)
Glucose-Capillary: 123 mg/dL — ABNORMAL HIGH (ref 70–99)
Glucose-Capillary: 141 mg/dL — ABNORMAL HIGH (ref 70–99)
Glucose-Capillary: 151 mg/dL — ABNORMAL HIGH (ref 70–99)

## 2024-05-28 LAB — BASIC METABOLIC PANEL WITH GFR
Anion gap: 17 — ABNORMAL HIGH (ref 5–15)
BUN: 51 mg/dL — ABNORMAL HIGH (ref 6–20)
CO2: 22 mmol/L (ref 22–32)
Calcium: 8.4 mg/dL — ABNORMAL LOW (ref 8.9–10.3)
Chloride: 85 mmol/L — ABNORMAL LOW (ref 98–111)
Creatinine, Ser: 9.73 mg/dL — ABNORMAL HIGH (ref 0.61–1.24)
GFR, Estimated: 6 mL/min — ABNORMAL LOW (ref >60)
Glucose, Bld: 121 mg/dL — ABNORMAL HIGH (ref 70–99)
Potassium: 2.9 mmol/L — ABNORMAL LOW (ref 3.5–5.1)
Sodium: 124 mmol/L — ABNORMAL LOW (ref 135–145)

## 2024-05-28 LAB — CBC
HCT: 26.7 % — ABNORMAL LOW (ref 39.0–52.0)
Hemoglobin: 9.8 g/dL — ABNORMAL LOW (ref 13.0–17.0)
MCH: 32.6 pg (ref 26.0–34.0)
MCHC: 36.7 g/dL — ABNORMAL HIGH (ref 30.0–36.0)
MCV: 88.7 fL (ref 80.0–100.0)
Platelets: 156 10*3/uL (ref 150–400)
RBC: 3.01 MIL/uL — ABNORMAL LOW (ref 4.22–5.81)
RDW: 12.3 % (ref 11.5–15.5)
WBC: 6.6 10*3/uL (ref 4.0–10.5)
nRBC: 0 % (ref 0.0–0.2)

## 2024-05-28 LAB — POTASSIUM: Potassium: 3.5 mmol/L (ref 3.5–5.1)

## 2024-05-28 LAB — HEPATITIS B SURFACE ANTIBODY, QUANTITATIVE: Hep B S AB Quant (Post): 444 m[IU]/mL

## 2024-05-28 MED ORDER — CLONIDINE HCL 0.1 MG PO TABS
ORAL_TABLET | ORAL | Status: AC
  Filled 2024-05-28: qty 1

## 2024-05-28 MED ORDER — CLONIDINE HCL 0.1 MG PO TABS
0.1000 mg | ORAL_TABLET | Freq: Once | ORAL | Status: AC
  Administered 2024-05-28: 0.1 mg via ORAL

## 2024-05-28 NOTE — Progress Notes (Signed)
 Discharge instructions and med details reviewed with patient. All questions answered. Printed AVS given to patient. IV removed. Patient escorted out via wheelchair

## 2024-05-28 NOTE — Progress Notes (Signed)
 [facility]  ROUNDING NOTE   Subjective:   Patient seen and evaluated during dialysis   HEMODIALYSIS FLOWSHEET:  Blood Flow Rate (mL/min): 399 mL/min Arterial Pressure (mmHg): -166.05 mmHg Venous Pressure (mmHg): 186.25 mmHg TMP (mmHg): 9.7 mmHg Ultrafiltration Rate (mL/min): 400 mL/min Dialysate Flow Rate (mL/min): 299 ml/min Dialysis Fluid Bolus: Normal Saline  No complaints to offer at this time  Objective:  Vital signs in last 24 hours:  Temp:  [97.9 F (36.6 C)-99.3 F (37.4 C)] 97.9 F (36.6 C) (05/31 0912) Pulse Rate:  [80-96] 80 (05/31 1230) Resp:  [11-30] 15 (05/31 1230) BP: (146-200)/(73-96) 161/76 (05/31 1230) SpO2:  [98 %-100 %] 100 % (05/31 1230) Weight:  [55.2 kg] 55.2 kg (05/31 0908)  Weight change:  Filed Weights   05/26/24 0710 05/26/24 1548 05/28/24 0908  Weight: 56.7 kg 56.6 kg 55.2 kg    Intake/Output: No intake/output data recorded.   Intake/Output this shift:  No intake/output data recorded.  Physical Exam: General: NAD  Head: Normocephalic, atraumatic. Moist oral mucosal membranes  Eyes: Anicteric  Lungs:  Clear to auscultation  Heart: Regular rate and rhythm  Abdomen:  Soft, nontender  Extremities:  No peripheral edema.  Neurologic: Awake, alert, conversant  Skin: Warm,dry, no rash  Access: Lt AVF    Basic Metabolic Panel: Recent Labs  Lab 05/26/24 0750 05/26/24 2108 05/27/24 0454 05/28/24 0456  NA 116* 121* 122* 124*  K 3.1*  --  3.8 2.9*  CL 71*  --  82* 85*  CO2 24  --  23 22  GLUCOSE 156*  --  324* 121*  BUN 54*  --  41* 51*  CREATININE 10.63*  --  8.68* 9.73*  CALCIUM  8.4*  --  8.4* 8.4*  MG 2.3  --   --   --     Liver Function Tests: Recent Labs  Lab 05/26/24 0750  AST 13*  ALT 14  ALKPHOS 90  BILITOT 1.5*  PROT 6.5  ALBUMIN 4.2   Recent Labs  Lab 05/26/24 0750 05/27/24 0454  LIPASE 221* 137*   No results for input(s): "AMMONIA" in the last 168 hours.  CBC: Recent Labs  Lab  05/26/24 0750 05/28/24 0935  WBC 10.2 6.6  HGB 11.7* 9.8*  HCT 31.3* 26.7*  MCV 86.9 88.7  PLT 168 156    Cardiac Enzymes: No results for input(s): "CKTOTAL", "CKMB", "CKMBINDEX", "TROPONINI" in the last 168 hours.  BNP: Invalid input(s): "POCBNP"  CBG: Recent Labs  Lab 05/27/24 1136 05/27/24 1646 05/27/24 2205 05/28/24 0110 05/28/24 0736  GLUCAP 282* 169* 149* 119* 123*    Microbiology: Results for orders placed or performed during the hospital encounter of 05/26/24  Gastrointestinal Panel by PCR , Stool     Status: None   Collection Time: 05/26/24 11:35 AM   Specimen: Stool  Result Value Ref Range Status   Campylobacter species NOT DETECTED NOT DETECTED Final   Plesimonas shigelloides NOT DETECTED NOT DETECTED Final   Salmonella species NOT DETECTED NOT DETECTED Final   Yersinia enterocolitica NOT DETECTED NOT DETECTED Final   Vibrio species NOT DETECTED NOT DETECTED Final   Vibrio cholerae NOT DETECTED NOT DETECTED Final   Enteroaggregative E coli (EAEC) NOT DETECTED NOT DETECTED Final   Enteropathogenic E coli (EPEC) NOT DETECTED NOT DETECTED Final   Enterotoxigenic E coli (ETEC) NOT DETECTED NOT DETECTED Final   Shiga like toxin producing E coli (STEC) NOT DETECTED NOT DETECTED Final   Shigella/Enteroinvasive E coli (EIEC) NOT DETECTED NOT  DETECTED Final   Cryptosporidium NOT DETECTED NOT DETECTED Final   Cyclospora cayetanensis NOT DETECTED NOT DETECTED Final   Entamoeba histolytica NOT DETECTED NOT DETECTED Final   Giardia lamblia NOT DETECTED NOT DETECTED Final   Adenovirus F40/41 NOT DETECTED NOT DETECTED Final   Astrovirus NOT DETECTED NOT DETECTED Final   Norovirus GI/GII NOT DETECTED NOT DETECTED Final   Rotavirus A NOT DETECTED NOT DETECTED Final   Sapovirus (I, II, IV, and V) NOT DETECTED NOT DETECTED Final    Comment: Performed at [facility], [address] [place], [place] [address]    Coagulation Studies: No results for input(s):  "LABPROT", "INR" in the last 72 hours.  Urinalysis: Recent Labs    05/26/24 1351  COLORURINE STRAW*  LABSPEC 1.008  PHURINE 6.0  GLUCOSEU 50*  HGBUR NEGATIVE  BILIRUBINUR NEGATIVE  KETONESUR NEGATIVE  PROTEINUR 100*  NITRITE NEGATIVE  LEUKOCYTESUR NEGATIVE      Imaging: No results found.    Medications:     amLODipine   10 mg Oral Daily   carvedilol   6.25 mg Oral BID WC   Chlorhexidine  Gluconate Cloth  6 each Topical Q0600   cholecalciferol   2,000 Units Oral Daily   clopidogrel   75 mg Oral Daily   ferric citrate   420 mg Oral TID   heparin  injection (subcutaneous)  5,000 Units Subcutaneous Q8H   hydrALAZINE   100 mg Oral BID   insulin  aspart  0-6 Units Subcutaneous TID AC & HS   insulin  aspart  6 Units Subcutaneous TID WC   insulin  glargine-yfgn  20 Units Subcutaneous Daily   metoCLOPramide  (REGLAN ) injection  5 mg Intravenous Q6H   pantoprazole   40 mg Oral Daily   rosuvastatin   20 mg Oral Daily   acetaminophen  **OR** acetaminophen , hydrALAZINE , ondansetron  **OR** ondansetron  (ZOFRAN ) IV  Assessment/ Plan:  George [name] is a 38 y.o.  male with past medical conditions including hypertension, diabetes, CVA, chronic hyponatremia, gastroparesis, and end-stage renal disease on hemodialysis, who was admitted to [facility] on 05/26/2024 for Hyponatremia [E87.1] Elevated lipase [R74.8] Nausea and vomiting, unspecified vomiting type [R11.2]  CK [facility]/TTS/Lt AVF  Hyponatremia with end-stage renal disease on hemodialysis. Sodium 116 on admission. Last dialysis treatment received on Tuesday. Has history of chronic hyponatremia however recent outpatient level was 134.  Sodium 124 today.  Will experience some correction with dialysis.  UF goal 0-0.5 as tolerated.  Next treatment scheduled for Tuesday.  2. Anemia of chronic kidney disease Lab Results  Component Value Date   HGB 9.8 (L) 05/28/2024    Hemoglobin remains acceptable.  No need for ESA's.  3. Secondary  Hyperparathyroidism: with outpatient labs: PTH 459, phosphorus 4.0, calcium  9.4 on 5/6.   Lab Results  Component Value Date   CALCIUM  8.4 (L) 05/28/2024   CAION 1.01 (L) 09/29/2021   PHOS 3.8 09/30/2021    Patient prescribed calcitriol and cholecalciferol  outpatient.  Calcium  and phosphorus within desired range.  4.  Hypertension with chronic kidney disease.  Home regimen includes amlodipine , carvedilol , and hydralazine .   Blood pressure 180/80 during dialysis.  Patient has not received antihypertensives prior to treatment.  Will order oral clonidine  0.1 mg once.  5.  Hypokalemia, potassium 2.9.  Will place patient on 4K bath during dialysis which will offer adequate replacement.  Will order potassium 1 hour after treatment.  If acceptable, will defer discharge plan to primary team.   LOS: 1 George [name] 5/31/202512:57 PM

## 2024-05-28 NOTE — Progress Notes (Signed)
  Received patient in bed to unit.   Informed consent signed and in chart.    TX duration:3.50     Transported by  Hand-off given to patient's nurse. Pt tolerated tx well.   Access used: Fistula Access issues: none   Total UF removed: 0.5  Medication(s) given: Clonidine  0.1 mg Post HD VS: wnl Post HD weight: 54.6     N. Rashaan Wyles LPN [facility]

## 2024-05-28 NOTE — Discharge Summary (Signed)
 Physician Discharge Summary   Patient: George [name] MRN: [id number] DOB: 09-16-1986  Admit date:     05/26/2024  Discharge date: 05/28/24  Discharge Physician: Melvinia Stager   PCP: Lawrance Presume, MD   Recommendations at discharge:    Resume your HD as before Keep log of sugars at home and review with PCP  Discharge Diagnoses: Principal Problem:   Hyponatremia Active Problems:   Nausea and vomiting   ESRD on dialysis [facility])   Gastroparesis  George [name] is a 38 y.o. male with medical history significant of IDDM type I, diabetic gastroparesis, CVA, ESRD on HD TTS, HTN, chronic hyponatremia, paroxysmal SVT, presented with persistent nauseous vomiting and dry heaves.    Acute on chronic hyponatremia secondary to G.I. losses from nausea vomiting gastroparesis - Hypovolemic secondary to repeated nauseous vomiting and dehydration -- No neurological deficit, repeat sodium level this evening -- came in with sodium of 116-- 122--124 -- hemodynamically stable. Mentation stable --ok from Nephrology standpoint to discharge. Pt quiet eager to go, feeling better   Persistent nauseous vomiting and dry heaves - Clinically likely recurrent gastroparesis.  Abdominal and physical exam benign. - Trial of Reglan  5 mg every 6 hours - As needed Zofran  -- patient tolerating regular diet   Hypokalemia - P.o. replacement-- potassium 3.5 today   HTN - Blood pressure borderline low yesterday -- resume home meds now that blood pressure is trending up    history of CVA - No acute concern, continue Plavix  and statin   IDDM with end-stage renal failure on dialysis -SSI -pt will cont his home regimen for insulin  as before --DM coordinator input noted --A1c 5.1%   Patient is eager to discharge. Overall better. D/c home    Family communication :none today Consults : nephrology CODE STATUS: full DVT Prophylaxis :heparin       Disposition: Home Diet recommendation:  Renal  diet DISCHARGE MEDICATION: Allergies as of 05/28/2024       Reactions   Bee Pollen Other (See Comments)   Stuffy nose        Medication List     TAKE these medications    amLODipine  10 MG tablet Commonly known as: NORVASC  Take 1 tablet (10 mg total) by mouth daily.   Auryxia  1 GM 210 MG(Fe) tablet Generic drug: ferric citrate  Take 420 mg by mouth 3 (three) times daily.   carvedilol  25 MG tablet Commonly known as: COREG  Take 1 tablet (25 mg total) by mouth 2 (two) times daily with a meal.   clopidogrel  75 MG tablet Commonly known as: PLAVIX  TAKE 1 TABLET(75 MG) BY MOUTH DAILY   Dexcom G6 Receiver Devi 1 each by Does not apply route daily as needed.   Dexcom G6 Transmitter Misc Use as directed daily as needed.   Dexcom G7 Sensor Misc Use to measure blood sugar   hydrALAZINE  100 MG tablet Commonly known as: APRESOLINE  Take 1 tablet (100 mg total) by mouth 2 (two) times daily. Hold dialysis day   NovoLOG  FlexPen 100 UNIT/ML FlexPen Generic drug: insulin  aspart Inject 5-30 Units into the skin See admin instructions. Mac daily 30 units to include correction scale and snack coverage three times a day with meals What changed:  how to take this when to take this additional instructions   OneTouch Verio test strip Generic drug: glucose blood Use as instructed to check blood sugar 3 times daily.   pantoprazole  40 MG tablet Commonly known as: PROTONIX  Take 1 tablet (40 mg total) by mouth daily.  Pen Needles 3/16" 31G X 5 MM Misc To inject insulin  with novolog  flex pen, 3 times daily with meals.   polyvinyl alcohol 1.4 % ophthalmic solution Commonly known as: LIQUIFILM TEARS Place into both eyes as needed for dry eyes.   promethazine  25 MG tablet Commonly known as: PHENERGAN  Take 25 mg by mouth 3 (three) times daily as needed for nausea or vomiting.   rosuvastatin  20 MG tablet Commonly known as: CRESTOR  Take 1 tablet (20 mg total) by mouth daily.    Semglee  (yfgn) 100 UNIT/ML Pen Generic drug: insulin  glargine-yfgn Inject 16 Units into the skin daily.   Vitamin D3 50 MCG (2000 UT) Tabs Take 2,000 Units by mouth daily.        Discharge Exam: Filed Weights   05/26/24 1548 05/28/24 0908 05/28/24 1340  Weight: 56.6 kg 55.2 kg 54.6 kg   GENERAL:  38 y.o.-year-old patient with no acute distress.  LUNGS: Normal breath sounds bilaterally, no wheezing CARDIOVASCULAR: S1, S2 normal. No murmur   ABDOMEN: Soft, nontender, nondistended. Bowel sounds present.  EXTREMITIES: No  edema b/l. HD access    NEUROLOGIC: nonfocal  patient is alert and awake    Condition at discharge: fair  The results of significant diagnostics from this hospitalization (including imaging, microbiology, ancillary and laboratory) are listed below for reference.   Imaging Studies: DG Chest 2 View Result Date: 05/26/2024 CLINICAL DATA:  Shortness of breath. EXAM: CHEST - 2 VIEW COMPARISON:  09/30/2021 FINDINGS: The heart size and mediastinal contours are within normal limits. Both lungs are clear. The visualized skeletal structures are unremarkable. IMPRESSION: No active cardiopulmonary disease. Electronically Signed   By: Donnal Fusi M.D.   On: 05/26/2024 07:45    Microbiology: Results for orders placed or performed during the hospital encounter of 05/26/24  Gastrointestinal Panel by PCR , Stool     Status: None   Collection Time: 05/26/24 11:35 AM   Specimen: Stool  Result Value Ref Range Status   Campylobacter species NOT DETECTED NOT DETECTED Final   Plesimonas shigelloides NOT DETECTED NOT DETECTED Final   Salmonella species NOT DETECTED NOT DETECTED Final   Yersinia enterocolitica NOT DETECTED NOT DETECTED Final   Vibrio species NOT DETECTED NOT DETECTED Final   Vibrio cholerae NOT DETECTED NOT DETECTED Final   Enteroaggregative E coli (EAEC) NOT DETECTED NOT DETECTED Final   Enteropathogenic E coli (EPEC) NOT DETECTED NOT DETECTED Final    Enterotoxigenic E coli (ETEC) NOT DETECTED NOT DETECTED Final   Shiga like toxin producing E coli (STEC) NOT DETECTED NOT DETECTED Final   Shigella/Enteroinvasive E coli (EIEC) NOT DETECTED NOT DETECTED Final   Cryptosporidium NOT DETECTED NOT DETECTED Final   Cyclospora cayetanensis NOT DETECTED NOT DETECTED Final   Entamoeba histolytica NOT DETECTED NOT DETECTED Final   Giardia lamblia NOT DETECTED NOT DETECTED Final   Adenovirus F40/41 NOT DETECTED NOT DETECTED Final   Astrovirus NOT DETECTED NOT DETECTED Final   Norovirus GI/GII NOT DETECTED NOT DETECTED Final   Rotavirus A NOT DETECTED NOT DETECTED Final   Sapovirus (I, II, IV, and V) NOT DETECTED NOT DETECTED Final    Comment: Performed at [facility], [address] [place], [place] [address]    Labs: CBC: Recent Labs  Lab 05/26/24 0750 05/28/24 0935  WBC 10.2 6.6  HGB 11.7* 9.8*  HCT 31.3* 26.7*  MCV 86.9 88.7  PLT 168 156   Basic Metabolic Panel: Recent Labs  Lab 05/26/24 0750 05/26/24 2108 05/27/24 0454 05/28/24 0456  05/28/24 1430  NA 116* 121* 122* 124*  --   K 3.1*  --  3.8 2.9* 3.5  CL 71*  --  82* 85*  --   CO2 24  --  23 22  --   GLUCOSE 156*  --  324* 121*  --   BUN 54*  --  41* 51*  --   CREATININE 10.63*  --  8.68* 9.73*  --   CALCIUM  8.4*  --  8.4* 8.4*  --   MG 2.3  --   --   --   --    Liver Function Tests: Recent Labs  Lab 05/26/24 0750  AST 13*  ALT 14  ALKPHOS 90  BILITOT 1.5*  PROT 6.5  ALBUMIN 4.2   CBG: Recent Labs  Lab 05/27/24 2205 05/28/24 0110 05/28/24 0736 05/28/24 1430 05/28/24 1557  GLUCAP 149* 119* 123* 141* 151*    Discharge time spent: greater than 30 minutes.  Signed: Melvinia Stager, MD [facility] 05/28/2024

## 2024-05-30 ENCOUNTER — Telehealth: Payer: Self-pay | Admitting: *Deleted

## 2024-05-30 NOTE — Transitions of Care (Post Inpatient/ED Visit) (Signed)
 05/30/2024  Name: George [name] MRN: [id number] DOB: [birth year]  Today's TOC FU Call Status: Today's TOC FU Call Status:: Successful TOC FU Call Completed TOC FU Call Complete Date: 05/30/24 Patient's Name and Date of Birth confirmed.  Transition Care Management Follow-up Telephone Call Date of Discharge: 05/28/24 Discharge Facility: [facility]) Type of Discharge: Inpatient Admission Primary Inpatient Discharge Diagnosis:: Hyponatremia How have you been since you were released from the hospital?: Better Any questions or concerns?: No  Items Reviewed: Did you receive and understand the discharge instructions provided?: Yes Medications obtained,verified, and reconciled?: Yes (Medications Reviewed) Any new allergies since your discharge?: No Dietary orders reviewed?: Yes Type of Diet Ordered:: Renal diet, carb modified Do you have support at home?: Yes People in Home [RPT]: parent(s) Name of Support/Comfort Primary Source: Dad/Alan  Medications Reviewed Today: Medications Reviewed Today     Reviewed by Aura Leeds, RN (Registered Nurse) on 05/30/24 at 1029  Med List Status: <None>   Medication Order Taking? Sig Documenting Provider Last Dose Status Informant  acetaminophen  (TYLENOL ) 500 MG tablet [id number] Yes Take 1,000 mg by mouth every 6 (six) hours as needed. [provider] Taking Active   amLODipine  (NORVASC ) 10 MG tablet [id number] Yes Take 1 tablet (10 mg total) by mouth daily. Vernell Goldsmith, MD Taking Active Self, Pharmacy Records  AURYXIA  1 GM 210 MG(Fe) tablet [id number] Yes Take 420 mg by mouth 3 (three) times daily. [provider] Taking Active Self, Pharmacy Records           Med Note Annette Barters, Nigel Bart   Thu May 26, 2024 10:41 AM) [place] Pear 2 tablets with every meal  carvedilol  (COREG ) 25 MG tablet [id number] Yes Take 1 tablet (25 mg total) by mouth 2 (two) times daily with a meal. Lawrance Presume, MD Taking  Active Self, Pharmacy Records  Cholecalciferol  (VITAMIN D3) 50 MCG (2000 UT) TABS [id number] Yes Take 2,000 Units by mouth daily. [provider] Taking Active Self, Pharmacy Records  clopidogrel  (PLAVIX ) 75 MG tablet [id number] Yes TAKE 1 TABLET(75 MG) BY MOUTH DAILY Lawrance Presume, MD Taking Active Self, Pharmacy Records  Continuous Blood Gluc Receiver (DEXCOM G6 RECEIVER) DEVI [id number] No 1 each by Does not apply route daily as needed.  Patient not taking: Reported on 05/30/2024   Hassie Lint, PA-C Not Taking Active Self, Pharmacy Records  Continuous Blood Gluc Sensor (DEXCOM G7 SENSOR) [place] [id number] No Use to measure blood sugar  Patient not taking: Reported on 05/30/2024   Vernell Goldsmith, MD Not Taking Active Self, Pharmacy Records  Continuous Glucose Transmitter [facility] G6 TRANSMITTER) MISC [id number] No Use as directed daily as needed.  Patient not taking: Reported on 05/30/2024   Vernell Goldsmith, MD Not Taking Active Self, Pharmacy Records  glucose blood [facility] VERIO) test strip [id number] Yes Use as instructed to check blood sugar 3 times daily. Vernell Goldsmith, MD Taking Active Self, Pharmacy Records  hydrALAZINE  (APRESOLINE ) 100 MG tablet [id number] Yes Take 1 tablet (100 mg total) by mouth 2 (two) times daily. Hold dialysis day Vernell Goldsmith, MD Taking Active Self, Pharmacy Records  Insulin  Pen Needle (PEN NEEDLES 3/16") 31G X 5 MM MISC [id number] Yes To inject insulin  with novolog  flex pen, 3 times daily with meals. Vernell Goldsmith, MD Taking Active Self, Pharmacy Records  NOVOLOG  FLEXPEN 100 UNIT/ML FlexPen [id number] Yes Inject 5-30 Units into the skin See admin instructions. Mac daily 30 units to include correction scale  and snack coverage three times a day with meals  Patient taking differently: Inject into the skin See admin instructions. Inject 5-30 Units into the skin See admin instructions. Mac daily 30 units to include correction scale and snack coverage  three-fourtimes a day with meals   Vernell Goldsmith, MD Taking Active Self, Pharmacy Records  pantoprazole  (PROTONIX ) 40 MG tablet [id number] Yes Take 1 tablet (40 mg total) by mouth daily. Vernell Goldsmith, MD Taking Active Self, Pharmacy Records  polyvinyl alcohol (LIQUIFILM TEARS) 1.4 % ophthalmic solution [id number] No Place into both eyes as needed for dry eyes.  Patient not taking: Reported on 05/30/2024   [provider] Not Taking Active Self, Pharmacy Records           Med Note Annette Barters, Nigel Bart   Thu May 26, 2024 10:39 AM) PRN  promethazine  (PHENERGAN ) 25 MG tablet [id number] Yes Take 25 mg by mouth 3 (three) times daily as needed for nausea or vomiting. [provider] Taking Active Self, Pharmacy Records  rosuvastatin  (CRESTOR ) 20 MG tablet [id number] Yes Take 1 tablet (20 mg total) by mouth daily. Vernell Goldsmith, MD Taking Active Self, Pharmacy Records  SEMGLEE , YFGN, 100 UNIT/ML Pen [id number] Yes Inject 16 Units into the skin daily. [provider] Taking Active Self, Pharmacy Records            Home Care and Equipment/Supplies: Were Home Health Services Ordered?: No Any new equipment or medical supplies ordered?: No  Functional Questionnaire: Do you need assistance with bathing/showering or dressing?: No Do you need assistance with meal preparation?: No Do you need assistance with eating?: No Do you have difficulty maintaining continence: No Do you need assistance with getting out of bed/getting out of a chair/moving?: No Do you have difficulty managing or taking your medications?: No  Follow up appointments reviewed: PCP Follow-up appointment confirmed?: Yes Date of PCP follow-up appointment?: 07/08/24 (RNCM advised patient to call for earlier appointment) Follow-up Provider: Dr. Lincoln Renshaw [facility] Follow-up appointment confirmed?: NA Do you need transportation to your follow-up appointment?: No Do you understand care options if  your condition(s) worsen?: Yes-patient verbalized understanding  SDOH Interventions Today    Flowsheet Row Most Recent Value  SDOH Interventions   Food Insecurity Interventions Intervention Not Indicated  Housing Interventions Intervention Not Indicated  Transportation Interventions Intervention Not Indicated  Utilities Interventions Intervention Not Indicated       Arna Better RN, BSN [place]  [facility] RN Care Manager [phone]

## 2024-06-08 LAB — HM DIABETES EYE EXAM

## 2024-06-28 ENCOUNTER — Ambulatory Visit: Payer: Medicare Other

## 2024-06-28 ENCOUNTER — Other Ambulatory Visit (HOSPITAL_COMMUNITY): Payer: Self-pay | Admitting: Nephrology

## 2024-06-28 DIAGNOSIS — N186 End stage renal disease: Secondary | ICD-10-CM

## 2024-06-29 ENCOUNTER — Encounter: Payer: Self-pay | Admitting: *Deleted

## 2024-06-30 ENCOUNTER — Other Ambulatory Visit (HOSPITAL_COMMUNITY): Payer: Self-pay | Admitting: Nephrology

## 2024-06-30 ENCOUNTER — Other Ambulatory Visit: Payer: Self-pay | Admitting: Student

## 2024-06-30 ENCOUNTER — Ambulatory Visit (HOSPITAL_COMMUNITY)
Admission: RE | Admit: 2024-06-30 | Discharge: 2024-06-30 | Disposition: A | Discharge status: Home / Self Care | Source: Ambulatory Visit | Attending: Nephrology | Admitting: Nephrology

## 2024-06-30 ENCOUNTER — Other Ambulatory Visit: Payer: Self-pay

## 2024-06-30 DIAGNOSIS — N186 End stage renal disease: Secondary | ICD-10-CM

## 2024-06-30 DIAGNOSIS — T82858A Stenosis of vascular prosthetic devices, implants and grafts, initial encounter: Secondary | ICD-10-CM | POA: Diagnosis present

## 2024-06-30 DIAGNOSIS — Y832 Surgical operation with anastomosis, bypass or graft as the cause of abnormal reaction of the patient, or of later complication, without mention of misadventure at the time of the procedure: Secondary | ICD-10-CM | POA: Insufficient documentation

## 2024-06-30 DIAGNOSIS — Z992 Dependence on renal dialysis: Secondary | ICD-10-CM | POA: Diagnosis not present

## 2024-06-30 HISTORY — PX: IR AV DIALY SHUNT INTRO NEEDLE/INTRACATH INITIAL W/PTA/IMG LEFT: IMG6103

## 2024-06-30 LAB — BASIC METABOLIC PANEL WITH GFR
Anion gap: 16 — ABNORMAL HIGH (ref 5–15)
BUN: 51 mg/dL — ABNORMAL HIGH (ref 6–20)
CO2: 25 mmol/L (ref 22–32)
Calcium: 8.9 mg/dL (ref 8.9–10.3)
Chloride: 90 mmol/L — ABNORMAL LOW (ref 98–111)
Creatinine, Ser: 12.54 mg/dL — ABNORMAL HIGH (ref 0.61–1.24)
GFR, Estimated: 5 mL/min — ABNORMAL LOW (ref >60)
Glucose, Bld: 237 mg/dL — ABNORMAL HIGH (ref 70–99)
Potassium: 4.5 mmol/L (ref 3.5–5.1)
Sodium: 131 mmol/L — ABNORMAL LOW (ref 135–145)

## 2024-06-30 LAB — GLUCOSE, CAPILLARY: Glucose-Capillary: 228 mg/dL — ABNORMAL HIGH (ref 70–99)

## 2024-06-30 MED ORDER — MIDAZOLAM HCL 2 MG/2ML IJ SOLN
INTRAMUSCULAR | Status: AC
  Filled 2024-06-30: qty 2

## 2024-06-30 MED ORDER — HEPARIN SODIUM (PORCINE) 1000 UNIT/ML IJ SOLN
INTRAMUSCULAR | Status: AC
  Filled 2024-06-30: qty 10

## 2024-06-30 MED ORDER — LIDOCAINE-EPINEPHRINE 1 %-1:100000 IJ SOLN
INTRAMUSCULAR | Status: AC
  Filled 2024-06-30: qty 1

## 2024-06-30 MED ORDER — IOHEXOL 300 MG/ML  SOLN
100.0000 mL | Freq: Once | INTRAMUSCULAR | Status: AC | PRN
Start: 2024-06-30 — End: 2024-06-30
  Administered 2024-06-30: 20 mL via INTRAVENOUS

## 2024-06-30 MED ORDER — IOHEXOL 300 MG/ML  SOLN
50.0000 mL | Freq: Once | INTRAMUSCULAR | Status: AC | PRN
Start: 2024-06-30 — End: 2024-06-30
  Administered 2024-06-30: 25 mL via INTRAVENOUS

## 2024-06-30 MED ORDER — HEPARIN SODIUM (PORCINE) 1000 UNIT/ML IJ SOLN
INTRAMUSCULAR | Status: AC | PRN
  Administered 2024-06-30: 3000 [IU] via INTRAVENOUS

## 2024-06-30 MED ORDER — SODIUM CHLORIDE 0.9 % IV SOLN: INTRAVENOUS | Status: DC

## 2024-06-30 MED ORDER — IOHEXOL 300 MG/ML  SOLN
100.0000 mL | Freq: Once | INTRAMUSCULAR | Status: AC | PRN
  Administered 2024-06-30: 40 mL via INTRAVENOUS

## 2024-06-30 MED ORDER — MIDAZOLAM HCL 2 MG/2ML IJ SOLN
INTRAMUSCULAR | Status: AC | PRN
  Administered 2024-06-30 (×4): .5 mg via INTRAVENOUS

## 2024-06-30 MED ORDER — LIDOCAINE-EPINEPHRINE 1 %-1:100000 IJ SOLN
20.0000 mL | Freq: Once | INTRAMUSCULAR | Status: AC
  Administered 2024-06-30: 10 mL

## 2024-06-30 NOTE — Procedures (Addendum)
 Vascular and Interventional Radiology Procedure Note  Patient: George [name] DOB: [birth year] Medical Record Number: [id number] Note Date/Time: 06/30/24 11:37 AM   Performing Physician: Thom Hall, MD Assistant(s): None  Diagnosis: ESRD on HD. Malfunctioning LUE access. Prior JA venous stenosis, Rx 01/2024 in Cath Lab   Procedure:  LEFT UPPER EXTREMITY DIALYSIS CIRCUIT STUDY, including ARTERIOGRAPHY BALLOON ANGIOPLASTY OF JUXTA-ANASTOMOTIC VENOUS OUTFLOW STENOSIS   Anesthesia: Conscious Sedation Complications: None Estimated Blood Loss: Minimal Specimens: None   Findings:  - Retrograde access at LUE AVF - Widely patent AA and central veins. - Radiocephalic AVF with juxta-anatostomic venous anastomosis outflow stenosis. Rx with 7 mm DCB PTA. - Question inflow issue without adequate reflux opacification of the proximal radial and brachial artery - 3K units of Heparin  IV administered - Good venographic result at the end of the Case.    Hemostasis of the tract was achieved using Manual Pressure and purse string suture.   Plan: LUE HD access OK for use.  Final report to follow once all images are reviewed and compared with previous studies.  See detailed dictation with images in PACS. The patient tolerated the procedure well without incident or complication and was returned to Recovery in stable condition.    Thom Hall, MD [facility]  [facility]   Pager. [phone] Clinic. [phone]

## 2024-06-30 NOTE — Progress Notes (Signed)
 Patient states he ate a nutrigrain bar a couple of hours ago. Margarie, PA in radiology notified.

## 2024-07-08 ENCOUNTER — Encounter: Payer: Self-pay | Admitting: Internal Medicine

## 2024-07-08 ENCOUNTER — Ambulatory Visit: Attending: Internal Medicine | Admitting: Internal Medicine

## 2024-07-08 VITALS — BP 126/71 | HR 92 | Ht 63.0 in | Wt 122.0 lb

## 2024-07-08 DIAGNOSIS — I120 Hypertensive chronic kidney disease with stage 5 chronic kidney disease or end stage renal disease: Secondary | ICD-10-CM | POA: Diagnosis not present

## 2024-07-08 DIAGNOSIS — Z992 Dependence on renal dialysis: Secondary | ICD-10-CM | POA: Insufficient documentation

## 2024-07-08 DIAGNOSIS — Z8673 Personal history of transient ischemic attack (TIA), and cerebral infarction without residual deficits: Secondary | ICD-10-CM | POA: Diagnosis not present

## 2024-07-08 DIAGNOSIS — Z7902 Long term (current) use of antithrombotics/antiplatelets: Secondary | ICD-10-CM | POA: Diagnosis not present

## 2024-07-08 DIAGNOSIS — Z79899 Other long term (current) drug therapy: Secondary | ICD-10-CM | POA: Diagnosis not present

## 2024-07-08 DIAGNOSIS — E1043 Type 1 diabetes mellitus with diabetic autonomic (poly)neuropathy: Secondary | ICD-10-CM | POA: Insufficient documentation

## 2024-07-08 DIAGNOSIS — N186 End stage renal disease: Secondary | ICD-10-CM | POA: Insufficient documentation

## 2024-07-08 DIAGNOSIS — E10319 Type 1 diabetes mellitus with unspecified diabetic retinopathy without macular edema: Secondary | ICD-10-CM | POA: Insufficient documentation

## 2024-07-08 DIAGNOSIS — E1059 Type 1 diabetes mellitus with other circulatory complications: Secondary | ICD-10-CM | POA: Diagnosis not present

## 2024-07-08 DIAGNOSIS — E1022 Type 1 diabetes mellitus with diabetic chronic kidney disease: Secondary | ICD-10-CM | POA: Insufficient documentation

## 2024-07-08 DIAGNOSIS — Z87891 Personal history of nicotine dependence: Secondary | ICD-10-CM | POA: Diagnosis not present

## 2024-07-08 DIAGNOSIS — I152 Hypertension secondary to endocrine disorders: Secondary | ICD-10-CM

## 2024-07-08 DIAGNOSIS — D631 Anemia in chronic kidney disease: Secondary | ICD-10-CM | POA: Diagnosis not present

## 2024-07-08 DIAGNOSIS — E10649 Type 1 diabetes mellitus with hypoglycemia without coma: Secondary | ICD-10-CM | POA: Insufficient documentation

## 2024-07-08 DIAGNOSIS — Z794 Long term (current) use of insulin: Secondary | ICD-10-CM | POA: Insufficient documentation

## 2024-07-08 MED ORDER — GVOKE HYPOPEN 2-PACK 1 MG/0.2ML ~~LOC~~ SOAJ: SUBCUTANEOUS | 1 refills | Status: AC

## 2024-07-08 MED ORDER — LANTUS SOLOSTAR 100 UNIT/ML ~~LOC~~ SOPN: 14.0000 [IU] | PEN_INJECTOR | Freq: Every day | SUBCUTANEOUS | 99 refills | Status: AC

## 2024-07-08 MED ORDER — FREESTYLE LIBRE 3 PLUS SENSOR MISC: 6 refills | Status: AC

## 2024-07-08 MED ORDER — LANTUS SOLOSTAR 100 UNIT/ML ~~LOC~~ SOPN: 16.0000 [IU] | PEN_INJECTOR | Freq: Every day | SUBCUTANEOUS | 99 refills | Status: DC

## 2024-07-08 NOTE — Patient Instructions (Addendum)
 VISIT SUMMARY:  You came in today for a follow-up visit to manage your diabetes, hypertension, and end-stage renal disease. We discussed your current treatment plans and made some adjustments to help you better manage your conditions.  YOUR PLAN:  -TYPE 1 DIABETES MELLITUS: Because of your low blood sugar episodes in the mornings and before lunch, I recommend decreasing Lantus  insulin  from 16 units at bedtime to 14 units at bedtime.  Try to eat a good breakfast.  If your breakfast is light, instead of doing 3 to 4 units of NovoLog , I would recommend taking only 2 units.  Take a snack with you to eat at dialysis to carry you until lunch.  We will prescribe a glucagon  pen for you to keep with you.  Also advised purchasing glucose tablets over-the-counter and keeping a pack with you.  -END STAGE RENAL DISEASE ON DIALYSIS: End Stage Renal Disease is the final stage of chronic kidney disease where your kidneys no longer function properly, requiring dialysis. You are currently undergoing dialysis three times a week (Tuesday, Thursday, Saturday).  -HYPERTENSION: Hypertension is high blood pressure. Your condition is well-controlled with your current medications: carvedilol  25 mg twice daily, amlodipine  10 mg daily, and hydralazine  100 mg twice daily except on dialysis days.  -STROKE: A stroke occurs when blood flow to a part of your brain is interrupted. You are managing this condition with Plavix .  -HYPERLIPIDEMIA: Hyperlipidemia is having high levels of fats in your blood, which can increase your risk of heart disease. You are managing this condition with rosuvastatin  20 mg daily.  -GENERAL HEALTH MAINTENANCE: You have not received the HPV vaccine series, which is recommended up to age 29. You are also due for a Medicare wellness visit, which includes various screenings and assessments. We recommend scheduling the HPV vaccine at [facility] and setting up your Medicare wellness visit on a non-dialysis  day.  INSTRUCTIONS:  Please schedule your Medicare wellness visit on a non-dialysis day and consider getting the HPV vaccine at [facility]. Continue with your current medications and follow the new recommendations for managing your diabetes. If you have any questions or concerns, feel free to contact our office.

## 2024-07-08 NOTE — Progress Notes (Signed)
 Patient ID: George [name], male    DOB: 04/03/1986  MRN: [id number]  CC: Diabetes (DM f/u. /No questions / concerns/Discuss HPV vaccines)   Subjective: George [name] is a 38 y.o. male who presents for chronic ds management. His concerns today include:  Patient with history of DM type I with retinopathy, HTN, ESRD on HD, CVA, IDA, tobacco dependence   Discussed the use of AI scribe software for clinical note transcription with the patient, who gave verbal consent to proceed.  History of Present Illness George [name] is a 38 year old male with type 1 diabetes, hypertension, and end-stage renal disease on dialysis who presents for follow-up.  DM: He is managing his diabetes with Lantus  insulin  at 16 units daily and Novolog  sliding scale.  Usually takes 3-4 units of the Novolog  with meals. Has a Freestyle Libre 3 continuous glucose monitor. His last A1c was 5.1, and previously it was 5.5. He experiences lows between 54-69 mg/dL about 2% of the time, typically a few hours after waking up but before lunch. He struggles with eating breakfast but is improving, usually having frozen waffles with peanut butter. He finds it challenging to manage his blood sugar during dialysis sessions and resorts to eating candy to keep BS up while on the HD machine bw late morning and lunch. Review of his CGM shows time in range over the past 2 weeks 72% of the times, between 181-250 24% of the times and greater than 250 3% of the times, low 1% of the times  He undergoes dialysis three times a week (Tuesday, Thursday, Saturday) for end-stage renal disease. He missed one session due to hospitalization for vomiting and low sodium levels in May, which required a brief hospital stay. He had to catch up on dialysis hours after receiving only a half treatment during his hospital stay.  For hypertension, he is on carvedilol  25 mg twice daily, amlodipine  10 mg daily, and hydralazine  100 mg twice daily except on  dialysis days. He is also taking rosuvastatin  20 mg for cholesterol and Plavix  for his history of stroke. He limits salt intake in his diet.  HM: He is considering getting the HPV vaccine series.  He does not recall getting it in high school.  However he defers on getting first dose today stating that he will try to get it at [facility] later on.  He is due for Medicare wellness visit.    Patient Active Problem List   Diagnosis Date Noted   Gastroparesis 05/26/2024   Restless legs 05/06/2023   Other disorders of phosphorus metabolism 05/14/2022   Iron  deficiency anemia, unspecified 01/02/2022   Personal history of nicotine  dependence 01/02/2022   Secondary hyperparathyroidism of renal origin (HCC) 01/02/2022   Anemia in chronic kidney disease 01/02/2022   Personal history of transient ischemic attack (TIA), and cerebral infarction without residual deficits 01/02/2022   Aftercare including intermittent dialysis (HCC) 12/10/2021   Hyponatremia 09/30/2021   Nausea and vomiting 09/30/2021   Type 1 diabetes (HCC)    GERD (gastroesophageal reflux disease)    ESRD on dialysis (HCC)    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 (HCC) 02/07/2020   Proliferative diabetic retinopathy associated with type 1 diabetes mellitus (HCC) 12/19/2019   Hyperlipidemia due to type 1 diabetes mellitus (HCC) 06/27/2019   HTN (hypertension)    History of CVA (cerebrovascular accident) 06/13/2019     Current Outpatient Medications on File Prior to Visit  Medication Sig Dispense Refill  acetaminophen  (TYLENOL ) 500 MG tablet Take 1,000 mg by mouth every 6 (six) hours as needed.     amLODipine  (NORVASC ) 10 MG tablet Take 1 tablet (10 mg total) by mouth daily. 90 tablet 2   AURYXIA  1 GM 210 MG(Fe) tablet Take 420 mg by mouth 3 (three) times daily.     carvedilol  (COREG ) 25 MG tablet Take 1 tablet (25 mg total) by mouth 2 (two) times daily with a meal. 180 tablet 1   Cholecalciferol  (VITAMIN D3) 50 MCG  (2000 UT) TABS Take 2,000 Units by mouth daily.     clopidogrel  (PLAVIX ) 75 MG tablet TAKE 1 TABLET(75 MG) BY MOUTH DAILY 90 tablet 3   glucose blood (ONETOUCH VERIO) test strip Use as instructed to check blood sugar 3 times daily. 100 each 11   hydrALAZINE  (APRESOLINE ) 100 MG tablet Take 1 tablet (100 mg total) by mouth 2 (two) times daily. Hold dialysis day 90 tablet 4   Insulin  Pen Needle (PEN NEEDLES 3/16) 31G X 5 MM MISC To inject insulin  with novolog  flex pen, 3 times daily with meals. 100 each 4   NOVOLOG  FLEXPEN 100 UNIT/ML FlexPen Inject 5-30 Units into the skin See admin instructions. Mac daily 30 units to include correction scale and snack coverage three times a day with meals 30 mL 2   pantoprazole  (PROTONIX ) 40 MG tablet Take 1 tablet (40 mg total) by mouth daily. 90 tablet 2   polyvinyl alcohol (LIQUIFILM TEARS) 1.4 % ophthalmic solution Place into both eyes as needed for dry eyes.     promethazine  (PHENERGAN ) 25 MG tablet Take 25 mg by mouth 3 (three) times daily as needed for nausea or vomiting.     rosuvastatin  (CRESTOR ) 20 MG tablet Take 1 tablet (20 mg total) by mouth daily. 90 tablet 2   Continuous Blood Gluc Receiver (DEXCOM G6 RECEIVER) DEVI 1 each by Does not apply route daily as needed. (Patient not taking: Reported on 07/08/2024) 1 each 0   No current facility-administered medications on file prior to visit.    Allergies  Allergen Reactions   Bee Pollen Other (See Comments)    Stuffy nose    Social History   Socioeconomic History   Marital status: Single    Spouse name: Not on file   Number of children: Not on file   Years of education: Not on file   Highest education level: Not on file  Occupational History   Not on file  Tobacco Use   Smoking status: Former    Current packs/day: 0.00    Average packs/day: 0.3 packs/day for 9.5 years (2.4 ttl pk-yrs)    Types: Cigarettes    Start date: 12/29/2010    Quit date: 06/25/2020    Years since quitting: 4.0    Smokeless tobacco: Never  Vaping Use   Vaping status: Former   Start date: 12/30/2015   Quit date: 12/29/2017  Substance and Sexual Activity   Alcohol use: Not Currently    Comment: 6 beers/week   Drug use: Yes    Types: Marijuana   Sexual activity: Not Currently  Other Topics Concern   Not on file  Social History Narrative   Not on file   Social Drivers of Health   Financial Resource Strain: Low Risk  (03/04/2024)   Overall Financial Resource Strain (CARDIA)    Difficulty of Paying Living Expenses: Not hard at all  Food Insecurity: No Food Insecurity (05/30/2024)   Hunger Vital Sign    Worried About  Running Out of Food in the Last Year: Never true    Ran Out of Food in the Last Year: Never true  Transportation Needs: No Transportation Needs (05/30/2024)   PRAPARE - Administrator, Civil Service (Medical): No    Lack of Transportation (Non-Medical): No  Physical Activity: Sufficiently Active (03/04/2024)   Exercise Vital Sign    Days of Exercise per Week: 3 days    Minutes of Exercise per Session: 60 min  Stress: No Stress Concern Present (03/04/2024)   [employer] of Occupational Health - Occupational Stress Questionnaire    Feeling of Stress : Not at all  Social Connections: Socially Isolated (05/26/2024)   Social Connection and Isolation Panel    Frequency of Communication with Friends and Family: Three times a week    Frequency of Social Gatherings with Friends and Family: Once a week    Attends Religious Services: Never    Database administrator or Organizations: No    Attends Banker Meetings: Never    Marital Status: Never married  Intimate Partner Violence: Not At Risk (05/30/2024)   Humiliation, Afraid, Rape, and Kick questionnaire    Fear of Current or Ex-Partner: No    Emotionally Abused: No    Physically Abused: No    Sexually Abused: No    Family History  Problem Relation Age of Onset   Hypertension Mother    Diabetes Mother     Hyperlipidemia Mother    Diabetes Father     Past Surgical History:  Procedure Laterality Date   A/V FISTULAGRAM N/A 02/15/2024   Procedure: A/V Fistulagram;  Surgeon: Tobie Gordy POUR, MD;  [facility] INVAS[facility] CV LAB;  Service: Cardiovascular;  Laterality: N/A;   AV FISTULA PLACEMENT Left 03/15/2021   Procedure: LEFT UPPER EXTREMITY ARTERIOVENOUS (AV) FISTULA CREATION;  Surgeon: Oris Krystal FALCON, MD;  Location: [facility] OR;  Service: Vascular;  Laterality: Left;   BUBBLE STUDY  06/20/2019   Procedure: BUBBLE STUDY;  Surgeon: Okey Vina GAILS, MD;  Location: [facility] ENDOSCOPY;  Service: Cardiovascular;;   IR AV DIALY SHUNT INTRO NEEDLE/INTRACATH INITIAL W/PTA/IMG LEFT  06/30/2024   PERIPHERAL VASCULAR BALLOON ANGIOPLASTY  02/15/2024   Procedure: PERIPHERAL VASCULAR BALLOON ANGIOPLASTY;  Surgeon: Tobie Gordy POUR, MD;  Location: [facility] INVASIVE CV LAB;  Service: Cardiovascular;;  70/80% inflow cephlic   TEE WITHOUT CARDIOVERSION N/A 06/20/2019   Procedure: TRANSESOPHAGEAL ECHOCARDIOGRAM (TEE);  Surgeon: Okey Vina GAILS, MD;  Location: [facility] ENDOSCOPY;  Service: Cardiovascular;  Laterality: N/A;    ROS: Review of Systems Negative except as stated above  PHYSICAL EXAM: BP 126/71 (BP Location: Right Arm, Patient Position: Sitting, Cuff Size: Normal)   Pulse 92   Ht 5' 3 (1.6 m)   Wt 122 lb (55.3 kg)   SpO2 98%   BMI 21.61 kg/m   Physical Exam   General appearance - alert, well appearing, middle-age Caucasian male and in no distress Chest - clear to auscultation, no wheezes, rales or rhonchi, symmetric air entry Heart - normal rate, regular rhythm, normal S1, S2, 2/6 SEM LUSB and RUSB Extremities - peripheral pulses normal, no pedal edema, no clubbing or cyanosis Diabetic Foot Exam - Simple   Simple Foot Form Diabetic Foot exam was performed with the following findings: Yes 07/08/2024  9:28 AM  Visual Inspection No deformities, no ulcerations, no other skin breakdown bilaterally: Yes Sensation Testing Intact to  touch and monofilament testing bilaterally: Yes Pulse Check Posterior Tibialis and Dorsalis pulse intact bilaterally:  Yes Comments         Latest Ref Rng & Units 06/30/2024   10:43 AM 05/28/2024    2:30 PM 05/28/2024    4:56 AM  CMP  Glucose 70 - 99 mg/dL 762   878   BUN 6 - 20 mg/dL 51   51   Creatinine 9.38 - 1.24 mg/dL 87.45   0.26   Sodium 864 - 145 mmol/L 131   124   Potassium 3.5 - 5.1 mmol/L 4.5  3.5  2.9   Chloride 98 - 111 mmol/L 90   85   CO2 22 - 32 mmol/L 25   22   Calcium  8.9 - 10.3 mg/dL 8.9   8.4    Lipid Panel     Component Value Date/Time   CHOL 105 09/02/2023 1023   TRIG 131 09/02/2023 1023   HDL 38 (L) 09/02/2023 1023   CHOLHDL 2.8 09/02/2023 1023   CHOLHDL 5.1 01/12/2020 0546   VLDL 43 (H) 01/12/2020 0546   LDLCALC 44 09/02/2023 1023    CBC    Component Value Date/Time   WBC 6.6 05/28/2024 0935   RBC 3.01 (L) 05/28/2024 0935   HGB 9.8 (L) 05/28/2024 0935   HGB 13.7 09/02/2023 1023   HCT 26.7 (L) 05/28/2024 0935   HCT 39.6 09/02/2023 1023   PLT 156 05/28/2024 0935   PLT 160 09/02/2023 1023   MCV 88.7 05/28/2024 0935   MCV 94 09/02/2023 1023   MCH 32.6 05/28/2024 0935   MCHC 36.7 (H) 05/28/2024 0935   RDW 12.3 05/28/2024 0935   RDW 12.3 09/02/2023 1023   LYMPHSABS 1.0 09/02/2023 1023   MONOABS 0.8 09/29/2021 2331   EOSABS 0.3 09/02/2023 1023   BASOSABS 0.1 09/02/2023 1023    ASSESSMENT AND PLAN: 1. Type 1 diabetes mellitus with other circulatory complication (HCC) (Primary) Patient reporting hypoglycemic episodes in the mornings before breakfast and leading up to lunchtime. -Advised eating a good breakfast every morning especially before going to dialysis and taking some fluids with him to snack on if needed during dialysis. -Decrease Lantus  insulin  to 14 units daily.  If he eats only a light breakfast, instead of taking 3 to 4 units of NovoLog , advised that he takes 2 units. Will send prescription for him to get glucagon  injector -  Continuous Glucose Sensor (FREESTYLE LIBRE 3 PLUS SENSOR) MISC; Change sensor every 15 days.  Dispense: 2 each; Refill: 6 - Glucagon  (GVOKE HYPOPEN  2-PACK) 1 MG/0.2ML SOAJ; UAD for hypoglycemic episodes  Dispense: 0.2 mL; Refill: 1 - insulin  glargine (LANTUS  SOLOSTAR) 100 UNIT/ML Solostar Pen; Inject 14 Units into the skin daily.  Dispense: 15 mL; Refill: PRN  2. Hypertension associated with type 1 diabetes mellitus (HCC) At goal.  Continue carvedilol  25 mg twice a day, Norvasc  10 mg daily and hydralazine  100 mg twice a day.  On dialysis days he does not take the hydralazine .  3. ESRD on hemodialysis [facility]) He is compliant with going to dialysis on Tuesday/Thursday/Saturdays  4. History of CVA (cerebrovascular accident) Continue Crestor  20 mg daily and Plavix     Patient was given the opportunity to ask questions.  Patient verbalized understanding of the plan and was able to repeat key elements of the plan.   This documentation was completed using Paediatric nurse.  Any transcriptional errors are unintentional.  No orders of the defined types were placed in this encounter.    Requested Prescriptions   Signed Prescriptions Disp Refills   Continuous Glucose Sensor (  FREESTYLE LIBRE 3 PLUS SENSOR) MISC 2 each 6    Sig: Change sensor every 15 days.   Glucagon  (GVOKE HYPOPEN  2-PACK) 1 MG/0.2ML SOAJ 0.2 mL 1    Sig: UAD for hypoglycemic episodes   insulin  glargine (LANTUS  SOLOSTAR) 100 UNIT/ML Solostar Pen 15 mL PRN    Sig: Inject 14 Units into the skin daily.    Return in about 4 months (around 11/08/2024) for Medicare Wellness Visit in 2-4   wks with CMA/LPN no a M/W/F.  Barnie Louder, MD, FACP

## 2024-07-11 ENCOUNTER — Other Ambulatory Visit: Payer: Self-pay

## 2024-07-13 NOTE — Progress Notes (Signed)
 Injection visit only  1) High risk PDR, OU The patient has diabetes typ 1 since age 38. Suboptimal control with h/o DKA. High risk PDR OU. FA 05/30/20 with NVD/NVE both eyes & peripheral non-perfusion.  CSME both eyes- WORSE AFTER 7 WEEKS. vabysmo both eyes   Follow up 6 weeks  During the discussion the patient was informed that this consent is valid for up to one year provided there was no change in their condition being treated, their proposed treatment and the involved risks.  The patient was advised that this consent may be revoked at any time. The patient voiced understanding vabysmo both eyes 01/21/2023  As of 07/13/2024 the assessment and plan are unchanged.  LFE 05/04/24

## 2024-08-09 ENCOUNTER — Telehealth: Payer: Self-pay

## 2024-08-09 NOTE — Telephone Encounter (Addendum)
 Adrien Daring from pt's dialysis center called to report a growing scab on pt's L forearm AVF.  Referral made.  Will schedule pt to be seen by [facility].  Debra verbally confirmed that pt knows to put pressure on the arm and call EMS or go to ED.

## 2024-08-11 ENCOUNTER — Telehealth: Payer: Self-pay

## 2024-08-11 NOTE — Telephone Encounter (Signed)
 Fax received from [place] at [facility] that was for an appt to be made with IR. I have called Adrien and let her know she sent fax to wrong place. She states she has also sent fax to IR. I have given her their direct dept number to call.

## 2024-08-22 NOTE — Progress Notes (Unsigned)
 [facility]   ASSESSMENT / PLAN: 38 y.o. male with ESRD dialyzing TTS via left forearm basilic vein transposition. He has some skin changes about the fistula, but no full thickness ulceration.  This has developed from chronic cannulation and adhesive tape use.  I encouraged him to avoid cannulating the fistula in this area. I saw him for a similar issue in December 2023. He seems to have a good understanding of what to look out for and a good plan for caring for the skin over his fistula. I counseled him that if the skin broke down and/or had bleeding, he should let me know as soon as possible or go to the ER.  CHIEF COMPLAINT: Scabbing over fistula  HISTORY OF PRESENT ILLNESS: George [name] is a 38 y.o. male with end-stage renal disease on hemodialysis Tuesday, Thursday, and Saturday using a left forearm basilic vein transposition.  This is been working very well for him for over 18 months.  There is an area in the middle of the fistula which has had some prolonged bleeding and scabbing.  In discussing his dialysis routine with him, it seems that the dialysis techs are favoring cannulating this area.  08/23/24: Patient returns to clinic for similar issue.  He developed scabbing over his fistula which concerned his nephrology team.  He is referred for further evaluation.  In the interim, the patient encouraged the dialysis staff to avoid cannulating the problematic area and began caring for the skin more rigorously.  Since, the skin has healed.  We reviewed worrisome findings, and made an action plan for what to do should any of these findings occur.  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/19/2019   Hypertension    Hyponatremia 09/30/2021   Nausea and vomiting 09/30/2021   Neurologic deficit due to acute  ischemic cerebrovascular accident (CVA) (HCC) 06/13/2019   Supraventricular tachycardia (HCC)    Type 1 diabetes (HCC)     Past Surgical History:  Procedure Laterality Date   A/V FISTULAGRAM N/A 02/15/2024   Procedure: A/V Fistulagram;  Surgeon: Tobie Gordy POUR, MD;  Location: [facility] INVASIVE CV LAB;  Service: Cardiovascular;  Laterality: N/A;   AV FISTULA PLACEMENT Left 03/15/2021   Procedure: LEFT UPPER EXTREMITY ARTERIOVENOUS (AV) FISTULA CREATION;  Surgeon: Oris Krystal FALCON, MD;  Location: [facility] OR;  Service: Vascular;  Laterality: Left;   BUBBLE STUDY  06/20/2019   Procedure: BUBBLE STUDY;  Surgeon: Okey Vina GAILS, MD;  Location: [facility] ENDOSCOPY;  Service: Cardiovascular;;   IR AV DIALY SHUNT INTRO NEEDLE/INTRACATH INITIAL W/PTA/IMG LEFT  06/30/2024   PERIPHERAL VASCULAR BALLOON ANGIOPLASTY  02/15/2024   Procedure: PERIPHERAL VASCULAR BALLOON ANGIOPLASTY;  Surgeon: Tobie Gordy POUR, MD;  Location: [facility] INVASIVE CV LAB;  Service: Cardiovascular;;  70/80% inflow cephlic   TEE WITHOUT CARDIOVERSION N/A 06/20/2019   Procedure: TRANSESOPHAGEAL ECHOCARDIOGRAM (TEE);  Surgeon: Okey Vina GAILS, MD;  Location: [facility] ENDOSCOPY;  Service: Cardiovascular;  Laterality: N/A;    Family History  Problem Relation Age of Onset   Hypertension Mother    Diabetes Mother    Hyperlipidemia Mother    Diabetes Father     Social History   Socioeconomic History   Marital status: Single    Spouse name: Not on file   Number of children: Not on file   Years of education:  Not on file   Highest education level: Not on file  Occupational History   Not on file  Tobacco Use   Smoking status: Former    Current packs/day: 0.00    Average packs/day: 0.3 packs/day for 9.5 years (2.4 ttl pk-yrs)    Types: Cigarettes    Start date: 12/29/2010    Quit date: 06/25/2020    Years since quitting: 4.1   Smokeless tobacco: Never  Vaping Use   Vaping status: Former   Start date: 12/30/2015   Quit date: 12/29/2017  Substance and Sexual Activity    Alcohol use: Not Currently    Comment: 6 beers/week   Drug use: Yes    Types: Marijuana   Sexual activity: Not Currently  Other Topics Concern   Not on file  Social History Narrative   Not on file   Social Drivers of Health   Financial Resource Strain: Low Risk  (03/04/2024)   Overall Financial Resource Strain (CARDIA)    Difficulty of Paying Living Expenses: Not hard at all  Food Insecurity: No Food Insecurity (05/30/2024)   Hunger Vital Sign    Worried About Running Out of Food in the Last Year: Never true    Ran Out of Food in the Last Year: Never true  Transportation Needs: No Transportation Needs (05/30/2024)   PRAPARE - Administrator, Civil Service (Medical): No    Lack of Transportation (Non-Medical): No  Physical Activity: Sufficiently Active (03/04/2024)   Exercise Vital Sign    Days of Exercise per Week: 3 days    Minutes of Exercise per Session: 60 min  Stress: No Stress Concern Present (03/04/2024)   [employer] of Occupational Health - Occupational Stress Questionnaire    Feeling of Stress : Not at all  Social Connections: Socially Isolated (05/26/2024)   Social Connection and Isolation Panel    Frequency of Communication with Friends and Family: Three times a week    Frequency of Social Gatherings with Friends and Family: Once a week    Attends Religious Services: Never    Database administrator or Organizations: No    Attends Banker Meetings: Never    Marital Status: Never married  Intimate Partner Violence: Not At Risk (05/30/2024)   Humiliation, Afraid, Rape, and Kick questionnaire    Fear of Current or Ex-Partner: No    Emotionally Abused: No    Physically Abused: No    Sexually Abused: No    Allergies  Allergen Reactions   Bee Pollen Other (See Comments)    Stuffy nose    Current Outpatient Medications  Medication Sig Dispense Refill   acetaminophen  (TYLENOL ) 500 MG tablet Take 1,000 mg by mouth every 6 (six) hours as needed.      amLODipine  (NORVASC ) 10 MG tablet Take 1 tablet (10 mg total) by mouth daily. 90 tablet 2   AURYXIA  1 GM 210 MG(Fe) tablet Take 420 mg by mouth 3 (three) times daily.     carvedilol  (COREG ) 25 MG tablet Take 1 tablet (25 mg total) by mouth 2 (two) times daily with a meal. 180 tablet 1   Cholecalciferol  (VITAMIN D3) 50 MCG (2000 UT) TABS Take 2,000 Units by mouth daily.     clopidogrel  (PLAVIX ) 75 MG tablet TAKE 1 TABLET(75 MG) BY MOUTH DAILY 90 tablet 3   Continuous Glucose Sensor (FREESTYLE LIBRE 3 PLUS SENSOR) MISC Change sensor every 15 days. 2 each 6   Glucagon  (GVOKE HYPOPEN  2-PACK) 1 MG/0.2ML SOAJ  UAD for hypoglycemic episodes 0.2 mL 1   glucose blood (ONETOUCH VERIO) test strip Use as instructed to check blood sugar 3 times daily. 100 each 11   hydrALAZINE  (APRESOLINE ) 100 MG tablet Take 1 tablet (100 mg total) by mouth 2 (two) times daily. Hold dialysis day 90 tablet 4   insulin  glargine (LANTUS  SOLOSTAR) 100 UNIT/ML Solostar Pen Inject 14 Units into the skin daily. 15 mL PRN   Insulin  Pen Needle (PEN NEEDLES 3/16) 31G X 5 MM MISC To inject insulin  with novolog  flex pen, 3 times daily with meals. 100 each 4   NOVOLOG  FLEXPEN 100 UNIT/ML FlexPen Inject 5-30 Units into the skin See admin instructions. Mac daily 30 units to include correction scale and snack coverage three times a day with meals 30 mL 2   pantoprazole  (PROTONIX ) 40 MG tablet Take 1 tablet (40 mg total) by mouth daily. 90 tablet 2   polyvinyl alcohol (LIQUIFILM TEARS) 1.4 % ophthalmic solution Place into both eyes as needed for dry eyes.     promethazine  (PHENERGAN ) 25 MG tablet Take 25 mg by mouth 3 (three) times daily as needed for nausea or vomiting.     rosuvastatin  (CRESTOR ) 20 MG tablet Take 1 tablet (20 mg total) by mouth daily. 90 tablet 2   No current facility-administered medications for this visit.    PHYSICAL EXAM Vitals:   08/23/24 0821  BP: (!) 159/80  Pulse: 87  Temp: 98.5 F (36.9 C)  SpO2: 98%   Weight: 129 lb (58.5 kg)  Height: 5' 3 (1.6 m)    Young man in no acute distress Regular rate and rhythm Unlabored breathing Left forearm basilic vein to the with good thrill.  The area in the center of the fistula does seem to have some skin changes.  The skin is easy to tent.   PERTINENT LABORATORY AND RADIOLOGIC DATA  Most recent CBC    Latest Ref Rng & Units 05/28/2024    9:35 AM 05/26/2024    7:50 AM 09/02/2023   10:23 AM  CBC  WBC 4.0 - 10.5 K/uL 6.6  10.2  6.8   Hemoglobin 13.0 - 17.0 g/dL 9.8  88.2  86.2   Hematocrit 39.0 - 52.0 % 26.7  31.3  39.6   Platelets 150 - 400 K/uL 156  168  160      Most recent CMP    Latest Ref Rng & Units 06/30/2024   10:43 AM 05/28/2024    2:30 PM 05/28/2024    4:56 AM  CMP  Glucose 70 - 99 mg/dL 762   878   BUN 6 - 20 mg/dL 51   51   Creatinine 9.38 - 1.24 mg/dL 87.45   0.26   Sodium 864 - 145 mmol/L 131   124   Potassium 3.5 - 5.1 mmol/L 4.5  3.5  2.9   Chloride 98 - 111 mmol/L 90   85   CO2 22 - 32 mmol/L 25   22   Calcium  8.9 - 10.3 mg/dL 8.9   8.4     Renal function CrCl cannot be calculated (Patient's most recent lab result is older than the maximum 21 days allowed.).  HbA1c, POC (prediabetic range) (%)  Date Value  08/06/2022 5.8   HbA1c, POC (controlled diabetic range) (%)  Date Value  03/04/2024 6.6   Hgb A1c MFr Bld (%)  Date Value  05/26/2024 5.1    LDL Chol Calc (NIH)  Date Value Ref Range Status  09/02/2023  44 0 - 99 mg/dL Final    Debby SAILOR. Magda, MD FACS [facility] Phone Number: [phone] 08/23/2024 8:47 AM   Total time spent on preparing this encounter including chart review, data review, collecting history, examining the patient, coordinating care for this established patient, 30 minutes.  Portions of this report may have been transcribed using voice recognition software.  Every effort has been made to ensure accuracy; however, inadvertent computerized  transcription errors may still be present.

## 2024-08-23 ENCOUNTER — Ambulatory Visit: Attending: Vascular Surgery | Admitting: Vascular Surgery

## 2024-08-23 ENCOUNTER — Encounter: Payer: Self-pay | Admitting: Vascular Surgery

## 2024-08-23 VITALS — BP 159/80 | HR 87 | Temp 98.5°F | Ht 63.0 in | Wt 129.0 lb

## 2024-08-23 DIAGNOSIS — N186 End stage renal disease: Secondary | ICD-10-CM | POA: Insufficient documentation

## 2024-09-20 ENCOUNTER — Ambulatory Visit: Attending: Internal Medicine

## 2024-09-20 VITALS — Ht 63.0 in | Wt 120.0 lb

## 2024-09-20 DIAGNOSIS — Z0000 Encounter for general adult medical examination without abnormal findings: Secondary | ICD-10-CM | POA: Diagnosis not present

## 2024-09-20 NOTE — Progress Notes (Signed)
 Because this visit was a virtual/telehealth visit,  certain criteria was not obtained, such a blood pressure, CBG if applicable, and timed get up and go. Any medications not marked as taking were not mentioned during the medication reconciliation part of the visit. Any vitals not documented were not able to be obtained due to this being a telehealth visit or patient was unable to self-report a recent blood pressure reading due to a lack of equipment at home via telehealth. Vitals that have been documented are verbally provided by the patient.   Subjective:   George [name] is a 38 y.o. who presents for a Medicare Wellness preventive visit.  As a reminder, Annual Wellness Visits don't include a physical exam, and some assessments may be limited, especially if this visit is performed virtually. We may recommend an in-person follow-up visit with your provider if needed.  Visit Complete: Virtual I connected with  George [name] on 09/20/24 by a audio enabled telemedicine application and verified that I am speaking with the correct person using two identifiers.  Patient Location: Home  Provider Location: Office/Clinic  I discussed the limitations of evaluation and management by telemedicine. The patient expressed understanding and agreed to proceed.  Vital Signs: Because this visit was a virtual/telehealth visit, some criteria may be missing or patient reported. Any vitals not documented were not able to be obtained and vitals that have been documented are patient reported.  VideoDeclined- This patient declined Librarian, academic. Therefore the visit was completed with audio only.  Persons Participating in Visit: Patient.  AWV Questionnaire: No: Patient Medicare AWV questionnaire was not completed prior to this visit.  Cardiac Risk Factors include: diabetes mellitus;hypertension;family history of premature cardiovascular disease     Objective:     Today's Vitals   09/20/24 1652  Weight: 120 lb (54.4 kg)  Height: 5' 3 (1.6 m)  PainSc: 0-No pain   Body mass index is 21.26 kg/m.     09/20/2024    4:53 PM 05/26/2024    7:11 AM 06/24/2023    9:57 AM 03/15/2021    9:46 AM 09/26/2019    2:00 AM 06/20/2019   10:50 AM 06/14/2019    8:00 AM  Advanced Directives  Does Patient Have a Medical Advance Directive? No No No No No No No  Would patient like information on creating a medical advance directive? No - Patient declined No - Patient declined Yes (MAU/Ambulatory/Procedural Areas - Information given) No - Patient declined No - Patient declined No - Patient declined  No - Patient declined      Data saved with a previous flowsheet row definition    Current Medications (verified) Outpatient Encounter Medications as of 09/20/2024  Medication Sig   acetaminophen  (TYLENOL ) 500 MG tablet Take 1,000 mg by mouth every 6 (six) hours as needed.   amLODipine  (NORVASC ) 10 MG tablet Take 1 tablet (10 mg total) by mouth daily.   AURYXIA  1 GM 210 MG(Fe) tablet Take 420 mg by mouth 3 (three) times daily.   carvedilol  (COREG ) 25 MG tablet Take 1 tablet (25 mg total) by mouth 2 (two) times daily with a meal.   Cholecalciferol  (VITAMIN D3) 50 MCG (2000 UT) TABS Take 2,000 Units by mouth daily.   clopidogrel  (PLAVIX ) 75 MG tablet TAKE 1 TABLET(75 MG) BY MOUTH DAILY   Continuous Glucose Sensor (FREESTYLE LIBRE 3 PLUS SENSOR) MISC Change sensor every 15 days.   Glucagon  (GVOKE HYPOPEN  2-PACK) 1 MG/0.2ML SOAJ UAD for hypoglycemic  episodes   glucose blood (ONETOUCH VERIO) test strip Use as instructed to check blood sugar 3 times daily.   hydrALAZINE  (APRESOLINE ) 100 MG tablet Take 1 tablet (100 mg total) by mouth 2 (two) times daily. Hold dialysis day   insulin  glargine (LANTUS  SOLOSTAR) 100 UNIT/ML Solostar Pen Inject 14 Units into the skin daily.   Insulin  Pen Needle (PEN NEEDLES 3/16) 31G X 5 MM MISC To inject insulin  with novolog  flex pen, 3 times daily  with meals.   NOVOLOG  FLEXPEN 100 UNIT/ML FlexPen Inject 5-30 Units into the skin See admin instructions. Mac daily 30 units to include correction scale and snack coverage three times a day with meals   pantoprazole  (PROTONIX ) 40 MG tablet Take 1 tablet (40 mg total) by mouth daily.   polyvinyl alcohol (LIQUIFILM TEARS) 1.4 % ophthalmic solution Place into both eyes as needed for dry eyes.   promethazine  (PHENERGAN ) 25 MG tablet Take 25 mg by mouth 3 (three) times daily as needed for nausea or vomiting.   rosuvastatin  (CRESTOR ) 20 MG tablet Take 1 tablet (20 mg total) by mouth daily.   No facility-administered encounter medications on file as of 09/20/2024.    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) 09/19/2019   Hypertension    Hyponatremia 09/30/2021   Nausea and vomiting 09/30/2021   Neurologic deficit due to acute ischemic cerebrovascular accident (CVA) (HCC) 06/13/2019   Supraventricular tachycardia    Type 1 diabetes (HCC)    Past Surgical History:  Procedure Laterality Date   A/V FISTULAGRAM N/A 02/15/2024   Procedure: A/V Fistulagram;  Surgeon: Tobie Gordy POUR, MD;  Location: [facility] INVASIVE CV LAB;  Service: Cardiovascular;  Laterality: N/A;   AV FISTULA PLACEMENT Left 03/15/2021   Procedure: LEFT UPPER EXTREMITY ARTERIOVENOUS (AV) FISTULA CREATION;  Surgeon: Oris Krystal FALCON, MD;  Location: [facility] OR;  Service: Vascular;  Laterality: Left;   BUBBLE STUDY  06/20/2019   Procedure: BUBBLE STUDY;  Surgeon: Okey Vina GAILS, MD;  Location: [facility] ENDOSCOPY;  Service: Cardiovascular;;   IR AV DIALY SHUNT INTRO NEEDLE/INTRACATH INITIAL W/PTA/IMG LEFT  06/30/2024   PERIPHERAL VASCULAR BALLOON ANGIOPLASTY  02/15/2024   Procedure: PERIPHERAL VASCULAR BALLOON ANGIOPLASTY;  Surgeon: Tobie Gordy POUR, MD;  Location: [facility] INVASIVE CV  LAB;  Service: Cardiovascular;;  70/80% inflow cephlic   TEE WITHOUT CARDIOVERSION N/A 06/20/2019   Procedure: TRANSESOPHAGEAL ECHOCARDIOGRAM (TEE);  Surgeon: Okey Vina GAILS, MD;  Location: [facility] ENDOSCOPY;  Service: Cardiovascular;  Laterality: N/A;   Family History  Problem Relation Age of Onset   Hypertension Mother    Diabetes Mother    Hyperlipidemia Mother    Diabetes Father    Social History   Socioeconomic History   Marital status: Single    Spouse name: Not on file   Number of children: Not on file   Years of education: Not on file   Highest education level: Not on file  Occupational History   Not on file  Tobacco Use   Smoking status: Former    Current packs/day: 0.00    Average packs/day: 0.3 packs/day for 9.5 years (2.4 ttl pk-yrs)    Types: Cigarettes    Start date: 12/29/2010    Quit date: 06/25/2020    Years since quitting: 4.2   Smokeless tobacco: Never  Vaping Use  Vaping status: Former   Start date: 12/30/2015   Quit date: 12/29/2017  Substance and Sexual Activity   Alcohol use: Not Currently    Comment: 6 beers/week   Drug use: Yes    Types: Marijuana   Sexual activity: Not Currently  Other Topics Concern   Not on file  Social History Narrative   Not on file   Social Drivers of Health   Financial Resource Strain: Low Risk  (09/20/2024)   Overall Financial Resource Strain (CARDIA)    Difficulty of Paying Living Expenses: Not hard at all  Food Insecurity: No Food Insecurity (09/20/2024)   Hunger Vital Sign    Worried About Running Out of Food in the Last Year: Never true    Ran Out of Food in the Last Year: Never true  Transportation Needs: No Transportation Needs (09/20/2024)   PRAPARE - Administrator, Civil Service (Medical): No    Lack of Transportation (Non-Medical): No  Physical Activity: Sufficiently Active (09/20/2024)   Exercise Vital Sign    Days of Exercise per Week: 3 days    Minutes of Exercise per Session: 60 min  Stress: No  Stress Concern Present (09/20/2024)   [employer] of Occupational Health - Occupational Stress Questionnaire    Feeling of Stress: Not at all  Social Connections: Socially Isolated (09/20/2024)   Social Connection and Isolation Panel    Frequency of Communication with Friends and Family: Three times a week    Frequency of Social Gatherings with Friends and Family: Once a week    Attends Religious Services: Never    Database administrator or Organizations: No    Attends Engineer, structural: Never    Marital Status: Never married    Tobacco Counseling Counseling given: Not Answered    Clinical Intake:  Pre-visit preparation completed: Yes  Pain : No/denies pain Pain Score: 0-No pain     BMI - recorded: 21.26 Nutritional Status: BMI of 19-24  Normal Nutritional Risks: None Diabetes: Yes CBG done?: No Did pt. bring in CBG monitor from home?: No  Lab Results  Component Value Date   HGBA1C 5.1 05/26/2024   HGBA1C 6.6 03/04/2024   HGBA1C 6.9 09/02/2023     How often do you need to have someone help you when you read instructions, pamphlets, or other written materials from your doctor or pharmacy?: 1 - Never  Interpreter Needed?: No  Information entered by :: Mckennon Zwart N. Amari Burnsworth, LPN.   Activities of Daily Living     09/20/2024    4:57 PM 05/26/2024   10:00 AM  In your present state of health, do you have any difficulty performing the following activities:  Hearing? 0 0  Vision? 0 0  Difficulty concentrating or making decisions? 1 0  Comment Patient has neurologic deficit due to CVA.   Walking or climbing stairs? 0   Dressing or bathing? 0   Doing errands, shopping? 0 0  Preparing Food and eating ? N   Using the Toilet? N   In the past six months, have you accidently leaked urine? N   Do you have problems with loss of bowel control? N   Managing your Medications? N   Managing your Finances? N   Housekeeping or managing your Housekeeping? N      Patient Care Team: Vicci Barnie NOVAK, MD as PCP - General (Internal Medicine) Darliss Rogue, MD as PCP - Cardiology (Cardiology) Odelia Madison SAILOR, MD as Referring Physician (Ophthalmology) Magda,  Debby SAILOR, MD as Consulting Physician (Vascular Surgery) Leobardo Anes, DO as Consulting Physician (Nephrology)  I have updated your Care Teams any recent Medical Services you may have received from other providers in the past year.     Assessment:   This is a routine wellness examination for George [name].  Hearing/Vision screen Hearing Screening - Comments:: Patient has adequate hearing. Vision Screening - Comments:: Patient has adequate vision by wearing eyeglasses.  Patient has diabetic retinopathy and is seen by Dr. Madison Hoof with [facility]. Patient gets injections in both eyes every several weeks.   Goals Addressed               This Visit's Progress     09/20/2024 (pt-stated)        My healthcare goal for 2025-2026 is Don't fall back into old habits.  I have definitely learned my lesson.       Depression Screen     09/20/2024    4:55 PM 07/08/2024    8:57 AM 05/30/2024   10:36 AM 03/04/2024   10:42 AM 06/24/2023    9:59 AM 05/06/2023    9:44 AM 01/07/2023   10:21 AM  PHQ 2/9 Scores  PHQ - 2 Score 0 1 1 2 1 1  0  PHQ- 9 Score 2 4  4 2 2  0    Fall Risk     09/20/2024    4:55 PM 07/08/2024    8:57 AM 06/24/2023    9:57 AM 05/06/2023    9:40 AM 01/07/2023   10:12 AM  Fall Risk   Falls in the past year? 0 0 0 0 0  Number falls in past yr: 0 0 0 0 0  Injury with Fall? 0 0 0 0 0  Risk for fall due to : No Fall Risks No Fall Risks No Fall Risks No Fall Risks No Fall Risks  Follow up Falls evaluation completed Falls evaluation completed Falls prevention discussed;Education provided;Falls evaluation completed      MEDICARE RISK AT HOME:  Medicare Risk at Home Any stairs in or around the home?: No If so, are there any without handrails?: No Home free of loose  throw rugs in walkways, pet beds, electrical cords, etc?: Yes Adequate lighting in your home to reduce risk of falls?: Yes Life alert?: No Use of a cane, walker or w/c?: No Grab bars in the bathroom?: No Shower chair or bench in shower?: No Elevated toilet seat or a handicapped toilet?: No  TIMED UP AND GO:  Was the test performed?  No  Cognitive Function: Impaired: Patient has current diagnosis of cognitive impairment.    09/20/2024    4:55 PM  MMSE - Mini Mental State Exam  Not completed: Unable to complete        06/24/2023    9:58 AM  6CIT Screen  What Year? 0 points  What month? 0 points  What time? 0 points  Count back from 20 0 points  Months in reverse 0 points  Repeat phrase 0 points  Total Score 0 points    Immunizations Immunization History  Administered Date(s) Administered   DTP 06/01/1991   HIB (PRP-OMP) 06/01/1991   Hepatitis B 10/05/1997, 11/16/1997, 04/24/1998   Influenza, Mdck, Trivalent,PF 6+ MOS(egg free) 10/22/2023   Influenza, Quadrivalent, Recombinant, Inj, Pf 10/18/2022   Influenza,inj,Quad PF,6+ Mos 09/19/2019, 01/08/2021, 09/09/2021   Influenza-Unspecified 01/08/2021, 09/09/2021   OPV 06/01/1991   PFIZER Comirnaty(Gray Top)Covid-19 Tri-Sucrose Vaccine 06/13/2021   PFIZER(Purple Top)SARS-COV-2  Vaccination 03/20/2020, 04/10/2020, 12/10/2020   PNEUMOCOCCAL CONJUGATE-20 09/09/2021   Pneumococcal Polysaccharide-23 07/18/2019   Tdap 07/18/2019    Screening Tests Health Maintenance  Topic Date Due   HPV VACCINES (1 - 3-dose SCDM series) Never done   Influenza Vaccine  07/29/2024   COVID-19 Vaccine (5 - 2025-26 season) 08/29/2024   HEMOGLOBIN A1C  11/26/2024   OPHTHALMOLOGY EXAM  06/08/2025   FOOT EXAM  07/08/2025   Medicare Annual Wellness (AWV)  09/20/2025   DTaP/Tdap/Td (3 - Td or Tdap) 07/17/2029   Pneumococcal Vaccine  Completed   Hepatitis B Vaccines 19-59 Average Risk  Completed   Hepatitis C Screening  Completed   HIV Screening   Completed   Meningococcal B Vaccine  Aged Out    Health Maintenance Items Addressed: Yes Patient is due for vaccines.  Additional Screening:  Vision Screening: Recommended annual ophthalmology exams for early detection of glaucoma and other disorders of the eye. Is the patient up to date with their annual eye exam?  Yes  Who is the provider or what is the name of the office in which the patient attends annual eye exams? Dr. Madison Hoof at [facility]  Dental Screening: Recommended annual dental exams for proper oral hygiene  Community Resource Referral / Chronic Care Management: CRR required this visit?  No   CCM required this visit?  No   Plan:    I have personally reviewed and noted the following in the patient's chart:   Medical and social history Use of alcohol, tobacco or illicit drugs  Current medications and supplements including opioid prescriptions. Patient is not currently taking opioid prescriptions. Functional ability and status Nutritional status Physical activity Advanced directives List of other physicians Hospitalizations, surgeries, and ER visits in previous 12 months Vitals Screenings to include cognitive, depression, and falls Referrals and appointments  In addition, I have reviewed and discussed with patient certain preventive protocols, quality metrics, and best practice recommendations. A written personalized care plan for preventive services as well as general preventive health recommendations were provided to patient.   George LOISE Fuller, LPN   [id number]   After Visit Summary: (MyChart) Due to this being a telephonic visit, the after visit summary with patients personalized plan was offered to patient via MyChart   Notes: Nothing significant to report at this time.

## 2024-09-20 NOTE — Patient Instructions (Signed)
 [name],  Thank you for taking the time for your Medicare Wellness Visit. I appreciate your continued commitment to your health goals. Please review the care plan we discussed, and feel free to reach out if I can assist you further.  Medicare recommends these wellness visits once per year to help you and your care team stay ahead of potential health issues. These visits are designed to focus on prevention, allowing your provider to concentrate on managing your acute and chronic conditions during your regular appointments.  Please note that Annual Wellness Visits do not include a physical exam. Some assessments may be limited, especially if the visit was conducted virtually. If needed, we may recommend a separate in-person follow-up with your provider.  Ongoing Care Seeing your primary care provider every 3 to 6 months helps us  monitor your health and provide consistent, personalized care.   Referrals If a referral was made during today's visit and you haven't received any updates within two weeks, please contact the referred provider directly to check on the status.  Recommended Screenings:  Health Maintenance  Topic Date Due   HPV Vaccine (1 - 3-dose SCDM series) Never done   Flu Shot  07/29/2024   COVID-19 Vaccine (5 - 2025-26 season) 08/29/2024   Hemoglobin A1C  11/26/2024   Eye exam for diabetics  06/08/2025   Complete foot exam   07/08/2025   Medicare Annual Wellness Visit  09/20/2025   DTaP/Tdap/Td vaccine (3 - Td or Tdap) 07/17/2029   Pneumococcal Vaccine  Completed   Hepatitis B Vaccine  Completed   Hepatitis C Screening  Completed   HIV Screening  Completed   Meningitis B Vaccine  Aged Out       09/20/2024    4:53 PM  Advanced Directives  Does Patient Have a Medical Advance Directive? No  Would patient like information on creating a medical advance directive? No - Patient declined   Advance Care Planning is important because it: Ensures you receive medical care  that aligns with your values, goals, and preferences. Provides guidance to your family and loved ones, reducing the emotional burden of decision-making during critical moments.  Vision: Annual vision screenings are recommended for early detection of glaucoma, cataracts, and diabetic retinopathy. These exams can also reveal signs of chronic conditions such as diabetes and high blood pressure.  Dental: Annual dental screenings help detect early signs of oral cancer, gum disease, and other conditions linked to overall health, including heart disease and diabetes.  Please see the attached documents for additional preventive care recommendations.

## 2024-10-17 ENCOUNTER — Other Ambulatory Visit: Payer: Self-pay | Admitting: Critical Care Medicine

## 2024-10-17 DIAGNOSIS — I10 Essential (primary) hypertension: Secondary | ICD-10-CM

## 2024-11-01 ENCOUNTER — Other Ambulatory Visit: Payer: Self-pay | Admitting: Internal Medicine

## 2024-11-01 ENCOUNTER — Other Ambulatory Visit: Payer: Self-pay | Admitting: Critical Care Medicine

## 2024-11-01 DIAGNOSIS — Z7982 Long term (current) use of aspirin: Secondary | ICD-10-CM

## 2024-11-01 DIAGNOSIS — I10 Essential (primary) hypertension: Secondary | ICD-10-CM

## 2024-11-01 DIAGNOSIS — I152 Hypertension secondary to endocrine disorders: Secondary | ICD-10-CM

## 2024-11-06 ENCOUNTER — Other Ambulatory Visit: Payer: Self-pay | Admitting: Critical Care Medicine

## 2024-11-06 DIAGNOSIS — E1065 Type 1 diabetes mellitus with hyperglycemia: Secondary | ICD-10-CM

## 2024-11-08 NOTE — Telephone Encounter (Signed)
 Requested Prescriptions  Pending Prescriptions Disp Refills   NOVOLOG  FLEXPEN 100 UNIT/ML FlexPen [Pharmacy Med Name: NOVOLOG  FLEXPEN INJ 3ML (ORANGE)] 30 mL 0    Sig: INJECT 5 TO 30 UNITS UNDER THE SKIN DAILY AS DIRECTED, MAX DAILY 30 UNITS TO INCLUDE CORRECTION SCALE AND SNACK THREE TIMES DAILY WITH MEALS     Endocrinology:  Diabetes - Insulins Passed - 11/08/2024  1:55 PM      Passed - HBA1C is between 0 and 7.9 and within 180 days    HbA1c, POC (prediabetic range)  Date Value Ref Range Status  08/06/2022 5.8 5.7 - 6.4 % Final   HbA1c, POC (controlled diabetic range)  Date Value Ref Range Status  03/04/2024 6.6 0.0 - 7.0 % Final   Hgb A1c MFr Bld  Date Value Ref Range Status  05/26/2024 5.1 4.8 - 5.6 % Final    Comment:    (NOTE) Diagnosis of Diabetes The following HbA1c ranges recommended by the American Diabetes Association (ADA) may be used as an aid in the diagnosis of diabetes mellitus.  Hemoglobin             Suggested A1C NGSP%              Diagnosis  <5.7                   Non Diabetic  5.7-6.4                Pre-Diabetic  >6.4                   Diabetic  <7.0                   Glycemic control for                       adults with diabetes.           Passed - Valid encounter within last 6 months    Recent Outpatient Visits           4 months ago Type 1 diabetes mellitus with other circulatory complication (HCC)   [facility] [place]. [facility] Vicci Sober B, MD   8 months ago Type 1 diabetes mellitus with proliferative retinopathy of both eyes, macular edema presence unspecified, unspecified proliferative retinopathy type [facility])   [facility] Of [place]. [facility] Vicci Sober NOVAK, MD   1 year ago Type 1 diabetes mellitus with hyperglycemia [facility])   [facility] Dept Of [place]. [facility] Brien Belvie BRAVO, MD   1 year ago  Restless legs   [facility] - [facility] Dept Of [place]. [facility] Brien Belvie BRAVO, MD   1 year ago Type 1 diabetes mellitus with hyperglycemia [facility])   [facility] A Dept Of [place]. [facility] [place], Jon HERO, [place]       Future Appointments             In 3 days Vicci, Sober NOVAK, MD [facility] - A Dept Of [place]. [facility], [place]

## 2024-11-10 ENCOUNTER — Telehealth: Payer: Self-pay | Admitting: Internal Medicine

## 2024-11-10 NOTE — Telephone Encounter (Signed)
Confirmed appt for 11/14

## 2024-11-11 ENCOUNTER — Ambulatory Visit: Attending: Internal Medicine | Admitting: Internal Medicine

## 2024-11-11 ENCOUNTER — Encounter: Payer: Self-pay | Admitting: Internal Medicine

## 2024-11-11 VITALS — BP 157/81 | HR 92 | Temp 98.0°F | Ht 63.0 in | Wt 154.0 lb

## 2024-11-11 DIAGNOSIS — Z79899 Other long term (current) drug therapy: Secondary | ICD-10-CM | POA: Diagnosis not present

## 2024-11-11 DIAGNOSIS — Z8673 Personal history of transient ischemic attack (TIA), and cerebral infarction without residual deficits: Secondary | ICD-10-CM | POA: Diagnosis not present

## 2024-11-11 DIAGNOSIS — Z713 Dietary counseling and surveillance: Secondary | ICD-10-CM | POA: Diagnosis not present

## 2024-11-11 DIAGNOSIS — Z1331 Encounter for screening for depression: Secondary | ICD-10-CM | POA: Diagnosis not present

## 2024-11-11 DIAGNOSIS — E10319 Type 1 diabetes mellitus with unspecified diabetic retinopathy without macular edema: Secondary | ICD-10-CM | POA: Diagnosis not present

## 2024-11-11 DIAGNOSIS — Z992 Dependence on renal dialysis: Secondary | ICD-10-CM | POA: Diagnosis not present

## 2024-11-11 DIAGNOSIS — Z794 Long term (current) use of insulin: Secondary | ICD-10-CM | POA: Insufficient documentation

## 2024-11-11 DIAGNOSIS — E103599 Type 1 diabetes mellitus with proliferative diabetic retinopathy without macular edema, unspecified eye: Secondary | ICD-10-CM

## 2024-11-11 DIAGNOSIS — E1043 Type 1 diabetes mellitus with diabetic autonomic (poly)neuropathy: Secondary | ICD-10-CM | POA: Insufficient documentation

## 2024-11-11 DIAGNOSIS — D631 Anemia in chronic kidney disease: Secondary | ICD-10-CM | POA: Diagnosis not present

## 2024-11-11 DIAGNOSIS — N186 End stage renal disease: Secondary | ICD-10-CM | POA: Diagnosis not present

## 2024-11-11 DIAGNOSIS — E1022 Type 1 diabetes mellitus with diabetic chronic kidney disease: Secondary | ICD-10-CM | POA: Insufficient documentation

## 2024-11-11 DIAGNOSIS — H547 Unspecified visual loss: Secondary | ICD-10-CM | POA: Diagnosis not present

## 2024-11-11 DIAGNOSIS — H3500 Unspecified background retinopathy: Secondary | ICD-10-CM | POA: Insufficient documentation

## 2024-11-11 DIAGNOSIS — Z7902 Long term (current) use of antithrombotics/antiplatelets: Secondary | ICD-10-CM | POA: Diagnosis not present

## 2024-11-11 DIAGNOSIS — F32A Depression, unspecified: Secondary | ICD-10-CM | POA: Insufficient documentation

## 2024-11-11 DIAGNOSIS — E1059 Type 1 diabetes mellitus with other circulatory complications: Secondary | ICD-10-CM | POA: Diagnosis present

## 2024-11-11 DIAGNOSIS — I120 Hypertensive chronic kidney disease with stage 5 chronic kidney disease or end stage renal disease: Secondary | ICD-10-CM | POA: Insufficient documentation

## 2024-11-11 DIAGNOSIS — Z87891 Personal history of nicotine dependence: Secondary | ICD-10-CM | POA: Insufficient documentation

## 2024-11-11 DIAGNOSIS — N2581 Secondary hyperparathyroidism of renal origin: Secondary | ICD-10-CM | POA: Diagnosis not present

## 2024-11-11 LAB — GLUCOSE, POCT (MANUAL RESULT ENTRY): POC Glucose: 172 mg/dL — AB (ref 70–99)

## 2024-11-11 LAB — POCT GLYCOSYLATED HEMOGLOBIN (HGB A1C): HbA1c, POC (controlled diabetic range): 6.6 % (ref 0.0–7.0)

## 2024-11-11 NOTE — Progress Notes (Signed)
 Patient ID: George [name], male    DOB: 07-26-1986  MRN: [id number]  CC: Diabetes (DM f/u. Suellen dosage decrease of hydralazine  - now having high BP readings /Already received flu vax)   Subjective: George [name] is a 38 y.o. male who presents for chronic ds management. His concerns today include:  Patient with history of DM type I with retinopathy, HTN, ESRD on HD, CVA, IDA, tobacco dependence   Discussed the use of AI scribe software for clinical note transcription with the patient, who gave verbal consent to proceed.  History of Present Illness [name] is a 38 year old male with hypertension, diabetes, and chronic kidney disease who presents for follow-up of his chronic conditions.  HTN/ESRD on HD: His blood pressure has been elevated recently, with a notable instance reaching 194/unknown during dialysis, almost causing a delay in treatment. He is on carvedilol  25 mg twice daily, amlodipine  10 mg daily, and hydralazine  100 mg twice daily, except on dialysis days when the latter is held. At home, his blood pressure readings are typically in the 130s to 140s over 80s. He checks his blood pressure daily and limits his salt intake. No chest pain or shortness of breath.  DM:  Results for orders placed or performed in visit on 11/11/24  POCT glucose (manual entry)   Collection Time: 11/11/24  9:08 AM  Result Value Ref Range   POC Glucose 172 (A) 70 - 99 mg/dl  POCT glycosylated hemoglobin (Hb A1C)   Collection Time: 11/11/24  9:18 AM  Result Value Ref Range   Hemoglobin A1C     HbA1c POC (<> result, manual entry)     HbA1c, POC (prediabetic range)     HbA1c, POC (controlled diabetic range) 6.6 0.0 - 7.0 %    Regarding his diabetes, he was previously on Lantus  16 units, which was reduced to 14 units due to hypoglycemic episodes during dialysis. He uses a Novolog  sliding scale and averages about 4 units with meals. checks his blood sugar four times a day. He  experiences occasional hyperglycemia, particularly in the morning, despite limited carbohydrate intake. He is unable to afford his continuous glucose monitor due to high co-pay. He reports improvements in his eating habits, although he sometimes lacks motivation to cook due to past burnout from working in [employer]. He avoids sugary drinks and prefers diet options.   He has a history of stroke and is on rosuvastatin  and Plavix .   He also has anemia associated with chronic kidney disease and receives iron  infusions when needed during dialysis. Recently, he started treatment for hyperparathyroidism with Sensipar 30 mg, administered three times a week during dialysis, and receives calcitriol 0.25 mg during dialysis as well.  Pos Dep screen: He has retinopathy that has affected his ability to drive, with his left eye at 20/70 vision. He no longer drives and feels stuck at home most of the time. He occasionally experiences mild depression, particularly due to the loss of independence and routine changes after losing his ability to drive. Overall he feels the depression is not a major issue for him    Patient Active Problem List   Diagnosis Date Noted   Gastroparesis 05/26/2024   Restless legs 05/06/2023   Other disorders of phosphorus metabolism 05/14/2022   Iron  deficiency anemia, unspecified 01/02/2022   Personal history of nicotine  dependence 01/02/2022   Secondary hyperparathyroidism of renal origin 01/02/2022   Anemia in chronic kidney disease 01/02/2022   Personal history of transient  ischemic attack (TIA), and cerebral infarction without residual deficits 01/02/2022   Aftercare including intermittent dialysis 12/10/2021   Hyponatremia 09/30/2021   Nausea and vomiting 09/30/2021   Type 1 diabetes (HCC)    GERD (gastroesophageal reflux disease)    ESRD on dialysis (HCC)    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 (HCC) 02/07/2020   Proliferative diabetic retinopathy associated  with type 1 diabetes mellitus (HCC) 12/19/2019   Hyperlipidemia due to type 1 diabetes mellitus (HCC) 06/27/2019   HTN (hypertension)    History of CVA (cerebrovascular accident) 06/13/2019     Current Outpatient Medications on File Prior to Visit  Medication Sig Dispense Refill   acetaminophen  (TYLENOL ) 500 MG tablet Take 1,000 mg by mouth every 6 (six) hours as needed.     amLODipine  (NORVASC ) 10 MG tablet TAKE 1 TABLET(10 MG) BY MOUTH DAILY 90 tablet 0   AURYXIA  1 GM 210 MG(Fe) tablet Take 420 mg by mouth 3 (three) times daily.     calcitRIOL (ROCALTROL) 0.25 MCG capsule Take 0.25 mcg by mouth every other day.     carvedilol  (COREG ) 25 MG tablet TAKE 1 TABLET(25 MG) BY MOUTH TWICE DAILY WITH A MEAL 180 tablet 0   Cholecalciferol  (VITAMIN D3) 50 MCG (2000 UT) TABS Take 2,000 Units by mouth daily.     Cinacalcet HCl (SENSIPAR PO) Take 30 mg by mouth.     clopidogrel  (PLAVIX ) 75 MG tablet TAKE 1 TABLET(75 MG) BY MOUTH DAILY 90 tablet 3   Continuous Glucose Sensor (FREESTYLE LIBRE 3 PLUS SENSOR) MISC Change sensor every 15 days. 2 each 6   Glucagon  (GVOKE HYPOPEN  2-PACK) 1 MG/0.2ML SOAJ UAD for hypoglycemic episodes 0.2 mL 1   glucose blood (ONETOUCH VERIO) test strip Use as instructed to check blood sugar 3 times daily. 100 each 11   hydrALAZINE  (APRESOLINE ) 100 MG tablet TAKE 1 TABLET BY MOUTH TWICE DAILY; HOLD DIALYSIS DAY 180 tablet 0   insulin  glargine (LANTUS  SOLOSTAR) 100 UNIT/ML Solostar Pen Inject 14 Units into the skin daily. 15 mL PRN   Insulin  Pen Needle (PEN NEEDLES 3/16) 31G X 5 MM MISC To inject insulin  with novolog  flex pen, 3 times daily with meals. 100 each 4   NOVOLOG  FLEXPEN 100 UNIT/ML FlexPen INJECT 5 TO 30 UNITS UNDER THE SKIN DAILY AS DIRECTED, MAX DAILY 30 UNITS TO INCLUDE CORRECTION SCALE AND SNACK THREE TIMES DAILY WITH MEALS 30 mL 0   pantoprazole  (PROTONIX ) 40 MG tablet TAKE 1 TABLET(40 MG) BY MOUTH DAILY 90 tablet 0   polyvinyl alcohol (LIQUIFILM TEARS) 1.4 %  ophthalmic solution Place into both eyes as needed for dry eyes.     promethazine  (PHENERGAN ) 25 MG tablet Take 25 mg by mouth 3 (three) times daily as needed for nausea or vomiting.     rosuvastatin  (CRESTOR ) 20 MG tablet Take 1 tablet (20 mg total) by mouth daily. 90 tablet 2   No current facility-administered medications on file prior to visit.    Allergies  Allergen Reactions   Bee Pollen Other (See Comments)    Stuffy nose    Social History   Socioeconomic History   Marital status: Single    Spouse name: Not on file   Number of children: Not on file   Years of education: Not on file   Highest education level: Not on file  Occupational History   Not on file  Tobacco Use   Smoking status: Former    Current packs/day: 0.00  Average packs/day: 0.3 packs/day for 9.5 years (2.4 ttl pk-yrs)    Types: Cigarettes    Start date: 12/29/2010    Quit date: 06/25/2020    Years since quitting: 4.3   Smokeless tobacco: Never  Vaping Use   Vaping status: Former   Start date: 12/30/2015   Quit date: 12/29/2017  Substance and Sexual Activity   Alcohol use: Not Currently    Comment: 6 beers/week   Drug use: Yes    Types: Marijuana   Sexual activity: Not Currently  Other Topics Concern   Not on file  Social History Narrative   Not on file   Social Drivers of Health   Financial Resource Strain: Low Risk  (09/20/2024)   Overall Financial Resource Strain (CARDIA)    Difficulty of Paying Living Expenses: Not hard at all  Food Insecurity: No Food Insecurity (09/20/2024)   Hunger Vital Sign    Worried About Running Out of Food in the Last Year: Never true    Ran Out of Food in the Last Year: Never true  Transportation Needs: No Transportation Needs (09/20/2024)   PRAPARE - Administrator, Civil Service (Medical): No    Lack of Transportation (Non-Medical): No  Physical Activity: Sufficiently Active (09/20/2024)   Exercise Vital Sign    Days of Exercise per Week: 3 days     Minutes of Exercise per Session: 60 min  Stress: No Stress Concern Present (09/20/2024)   [employer] of Occupational Health - Occupational Stress Questionnaire    Feeling of Stress: Not at all  Social Connections: Socially Isolated (09/20/2024)   Social Connection and Isolation Panel    Frequency of Communication with Friends and Family: Three times a week    Frequency of Social Gatherings with Friends and Family: Once a week    Attends Religious Services: Never    Database Administrator or Organizations: No    Attends Banker Meetings: Never    Marital Status: Never married  Intimate Partner Violence: Not At Risk (09/20/2024)   Humiliation, Afraid, Rape, and Kick questionnaire    Fear of Current or Ex-Partner: No    Emotionally Abused: No    Physically Abused: No    Sexually Abused: No    Family History  Problem Relation Age of Onset   Hypertension Mother    Diabetes Mother    Hyperlipidemia Mother    Diabetes Father     Past Surgical History:  Procedure Laterality Date   A/V FISTULAGRAM N/A 02/15/2024   Procedure: A/V Fistulagram;  Surgeon: Tobie Gordy POUR, MD;  Location: [facility] INVASIVE CV LAB;  Service: Cardiovascular;  Laterality: N/A;   AV FISTULA PLACEMENT Left 03/15/2021   Procedure: LEFT UPPER EXTREMITY ARTERIOVENOUS (AV) FISTULA CREATION;  Surgeon: Oris Krystal FALCON, MD;  Location: [facility] OR;  Service: Vascular;  Laterality: Left;   BUBBLE STUDY  06/20/2019   Procedure: BUBBLE STUDY;  Surgeon: Okey Vina GAILS, MD;  Location: [facility] ENDOSCOPY;  Service: Cardiovascular;;   IR AV DIALY SHUNT INTRO NEEDLE/INTRACATH INITIAL W/PTA/IMG LEFT  06/30/2024   PERIPHERAL VASCULAR BALLOON ANGIOPLASTY  02/15/2024   Procedure: PERIPHERAL VASCULAR BALLOON ANGIOPLASTY;  Surgeon: Tobie Gordy POUR, MD;  Location: [facility] INVASIVE CV LAB;  Service: Cardiovascular;;  70/80% inflow cephlic   TEE WITHOUT CARDIOVERSION N/A 06/20/2019   Procedure: TRANSESOPHAGEAL ECHOCARDIOGRAM (TEE);  Surgeon: Okey Vina GAILS,  MD;  Location: [facility] ENDOSCOPY;  Service: Cardiovascular;  Laterality: N/A;    ROS: Review of Systems Negative except as  stated above  PHYSICAL EXAM: BP (!) 157/81 (BP Location: Left Arm, Patient Position: Sitting, Cuff Size: Small)   Pulse 92   Temp 98 F (36.7 C) (Oral)   Ht 5' 3 (1.6 m)   Wt 154 lb (69.9 kg)   SpO2 100%   BMI 27.28 kg/m   Wt Readings from Last 3 Encounters:  11/11/24 154 lb (69.9 kg)  09/20/24 120 lb (54.4 kg)  08/23/24 129 lb (58.5 kg)    Physical Exam   General appearance - alert, well appearing, young caucasian male and in no distress Mental status - normal mood, behavior, speech, dress, motor activity, and thought processes Neck - supple, no significant adenopathy Chest - clear to auscultation, no wheezes, rales or rhonchi, symmetric air entry Heart - RRR 2-3/6 SEM heard best at both upper sternal boarders  Extremities - peripheral pulses normal, no pedal edema, no clubbing or cyanosis    11/11/2024    9:44 AM 09/20/2024    4:55 PM 07/08/2024    8:57 AM  Depression screen PHQ 2/9  Decreased Interest 1 0 0  Down, Depressed, Hopeless 1 0 1  PHQ - 2 Score 2 0 1  Altered sleeping 1 1 1   Tired, decreased energy 1 0 1  Change in appetite 0 0 0  Feeling bad or failure about yourself  0 0 1  Trouble concentrating 0 1 0  Moving slowly or fidgety/restless 0 0 0  Suicidal thoughts 0 0 0  PHQ-9 Score 4 2  4    Difficult doing work/chores Not difficult at all Not difficult at all Somewhat difficult     Data saved with a previous flowsheet row definition       Latest Ref Rng & Units 06/30/2024   10:43 AM 05/28/2024    2:30 PM 05/28/2024    4:56 AM  CMP  Glucose 70 - 99 mg/dL 762   878   BUN 6 - 20 mg/dL 51   51   Creatinine 9.38 - 1.24 mg/dL 87.45   0.26   Sodium 864 - 145 mmol/L 131   124   Potassium 3.5 - 5.1 mmol/L 4.5  3.5  2.9   Chloride 98 - 111 mmol/L 90   85   CO2 22 - 32 mmol/L 25   22   Calcium  8.9 - 10.3 mg/dL 8.9   8.4    Lipid Panel      Component Value Date/Time   CHOL 105 09/02/2023 1023   TRIG 131 09/02/2023 1023   HDL 38 (L) 09/02/2023 1023   CHOLHDL 2.8 09/02/2023 1023   CHOLHDL 5.1 01/12/2020 0546   VLDL 43 (H) 01/12/2020 0546   LDLCALC 44 09/02/2023 1023    CBC    Component Value Date/Time   WBC 6.6 05/28/2024 0935   RBC 3.01 (L) 05/28/2024 0935   HGB 9.8 (L) 05/28/2024 0935   HGB 13.7 09/02/2023 1023   HCT 26.7 (L) 05/28/2024 0935   HCT 39.6 09/02/2023 1023   PLT 156 05/28/2024 0935   PLT 160 09/02/2023 1023   MCV 88.7 05/28/2024 0935   MCV 94 09/02/2023 1023   MCH 32.6 05/28/2024 0935   MCHC 36.7 (H) 05/28/2024 0935   RDW 12.3 05/28/2024 0935   RDW 12.3 09/02/2023 1023   LYMPHSABS 1.0 09/02/2023 1023   MONOABS 0.8 09/29/2021 2331   EOSABS 0.3 09/02/2023 1023   BASOSABS 0.1 09/02/2023 1023    ASSESSMENT AND PLAN: 1. Type 1 diabetes mellitus with other circulatory complication (  HCC) (Primary) At goal  Continue Lantus  14 units daily. - Continue Novolog  sliding scale. Continue regular BS monitoring - Advised on dietary habits and avoiding sugary drinks. - POCT glucose (manual entry) - POCT glycosylated hemoglobin (Hb A1C)  2. Proliferative diabetic retinopathy associated with type 1 diabetes mellitus, unspecified laterality, unspecified proliferative retinopathy type [facility]) Advised patient that he can check with [facility] as they may provide transportation where they would pick him up at his house or close to his house given his visual impairment  3. Hypertensive CKD, ESRD on dialysis (HCC) Better readings at home. I have not made in changes in his current meds. BP fluctuates with him being on HD Continue current antihypertensive regimen: carvedilol  25 mg twice daily, amlodipine  10 mg daily, hydralazine  100 mg twice daily except on dialysis days. - Advised on salt restriction.  4. History of CVA (cerebrovascular accident) Continue Plavix  and Crestor   5. Secondary hyperparathyroidism of renal  origin On Sensipar and Calcitriol. Med list updated  6. Anemia in chronic kidney disease, on chronic dialysis (HCC) Followed in HD  7. Positive depression screening Reports not a major issue at this time. Tied to his vision impairment. See #2 above   Patient was given the opportunity to ask questions.  Patient verbalized understanding of the plan and was able to repeat key elements of the plan.   This documentation was completed using Paediatric nurse.  Any transcriptional errors are unintentional.  Orders Placed This Encounter  Procedures   POCT glucose (manual entry)   POCT glycosylated hemoglobin (Hb A1C)     Requested Prescriptions    No prescriptions requested or ordered in this encounter    Return in about 4 months (around 03/11/2025).  Barnie Louder, MD, FACP

## 2024-11-11 NOTE — Patient Instructions (Signed)
  VISIT SUMMARY: You came in today for a follow-up on your chronic conditions, including hypertension, diabetes, and chronic kidney disease. We discussed your recent blood pressure readings, blood sugar levels, and overall health management. We also reviewed your current medications and made some recommendations to help manage your conditions better.  YOUR PLAN: -TYPE 1 DIABETES MELLITUS WITH CIRCULATORY COMPLICATIONS AND DIABETIC RETINOPATHY: Type 1 diabetes is a condition where your body does not produce insulin , leading to high blood sugar levels. Diabetic retinopathy is an eye condition caused by diabetes that affects your vision. Your A1c level is at 6.6%, which is slightly increased but still below the target. Continue taking Lantus  14 units daily and using the Novolog  sliding scale. If possible, use a continuous glucose monitor. Maintain your dietary habits and avoid sugary drinks.  -END STAGE RENAL DISEASE ON HEMODIALYSIS WITH HYPERTENSIVE CHRONIC KIDNEY DISEASE: End stage renal disease is the last stage of chronic kidney disease where your kidneys no longer function properly, requiring dialysis. Your blood pressure is generally well-controlled at home but fluctuates with dialysis. Continue your current medications: carvedilol  25 mg twice daily, amlodipine  10 mg daily, and hydralazine  100 mg twice daily except on dialysis days. Keep monitoring your blood pressure at home and continue to limit your salt intake.  -ANEMIA DUE TO CHRONIC KIDNEY DISEASE: Anemia is a condition where you do not have enough red blood cells, often due to chronic kidney disease. Continue receiving iron  supplementation during your dialysis sessions.  -SECONDARY HYPERPARATHYROIDISM OF RENAL ORIGIN: Secondary hyperparathyroidism is a condition where your parathyroid glands produce too much hormone due to kidney disease. Continue taking Sensipar 30 mg three times a week and calcitriol 0.25 mg during dialysis.  -DEPRESSION: You  have mild depression, likely related to changes in your routine and vision impairment. Engage in social activities and outings with friends, and explore transportation options to increase your mobility.  INSTRUCTIONS: Please continue to monitor your blood pressure and blood sugar levels at home regularly. Follow your current medication regimen and dietary recommendations. If you experience any significant changes or have concerns, schedule a follow-up appointment.                      Contains text generated by Abridge.                                 Contains text generated by Abridge.

## 2024-12-26 ENCOUNTER — Other Ambulatory Visit: Payer: Self-pay | Admitting: Critical Care Medicine

## 2024-12-26 DIAGNOSIS — E1065 Type 1 diabetes mellitus with hyperglycemia: Secondary | ICD-10-CM

## 2025-01-30 ENCOUNTER — Other Ambulatory Visit: Payer: Self-pay | Admitting: Internal Medicine

## 2025-01-30 DIAGNOSIS — I10 Essential (primary) hypertension: Secondary | ICD-10-CM

## 2025-03-13 ENCOUNTER — Ambulatory Visit: Admitting: Internal Medicine

## 2025-09-26 ENCOUNTER — Ambulatory Visit
